# Patient Record
Sex: Male | Born: 1940 | Race: Black or African American | Hispanic: No | Marital: Single | Smoking: Current every day smoker
Health system: Southern US, Community
[De-identification: ages and names within clinical notes are randomized; demographics above are authoritative.]

## PROBLEM LIST (undated history)

## (undated) DIAGNOSIS — M24549 Contracture, unspecified hand: Secondary | ICD-10-CM

## (undated) DIAGNOSIS — E119 Type 2 diabetes mellitus without complications: Secondary | ICD-10-CM

## (undated) DIAGNOSIS — M069 Rheumatoid arthritis, unspecified: Secondary | ICD-10-CM

## (undated) DIAGNOSIS — F419 Anxiety disorder, unspecified: Secondary | ICD-10-CM

## (undated) DIAGNOSIS — K5790 Diverticulosis of intestine, part unspecified, without perforation or abscess without bleeding: Secondary | ICD-10-CM

## (undated) DIAGNOSIS — I219 Acute myocardial infarction, unspecified: Secondary | ICD-10-CM

## (undated) DIAGNOSIS — I10 Essential (primary) hypertension: Secondary | ICD-10-CM

## (undated) DIAGNOSIS — T148XXA Other injury of unspecified body region, initial encounter: Secondary | ICD-10-CM

## (undated) DIAGNOSIS — I2510 Atherosclerotic heart disease of native coronary artery without angina pectoris: Secondary | ICD-10-CM

## (undated) DIAGNOSIS — I440 Atrioventricular block, first degree: Secondary | ICD-10-CM

## (undated) DIAGNOSIS — K859 Acute pancreatitis without necrosis or infection, unspecified: Secondary | ICD-10-CM

## (undated) DIAGNOSIS — I491 Atrial premature depolarization: Secondary | ICD-10-CM

## (undated) DIAGNOSIS — I517 Cardiomegaly: Secondary | ICD-10-CM

## (undated) DIAGNOSIS — J189 Pneumonia, unspecified organism: Secondary | ICD-10-CM

## (undated) DIAGNOSIS — I209 Angina pectoris, unspecified: Secondary | ICD-10-CM

## (undated) DIAGNOSIS — I639 Cerebral infarction, unspecified: Secondary | ICD-10-CM

## (undated) HISTORY — PX: INGUINAL HERNIA REPAIR: SUR1180

## (undated) HISTORY — PX: LACERATION REPAIR: SHX5168

## (undated) HISTORY — PX: ORTHOPEDIC SURGERY: SHX850

## (undated) HISTORY — PX: CATARACT EXTRACTION W/ INTRAOCULAR LENS  IMPLANT, BILATERAL: SHX1307

## (undated) HISTORY — PX: PLEURAL SCARIFICATION: SHX748

---

## 1998-10-18 ENCOUNTER — Encounter: Payer: Self-pay | Admitting: Family Medicine

## 1998-10-18 ENCOUNTER — Ambulatory Visit (HOSPITAL_COMMUNITY): Admission: RE | Admit: 1998-10-18 | Discharge: 1998-10-18 | Payer: Self-pay | Admitting: Family Medicine

## 1999-02-22 ENCOUNTER — Ambulatory Visit (HOSPITAL_COMMUNITY): Admission: RE | Admit: 1999-02-22 | Discharge: 1999-02-22 | Payer: Self-pay | Admitting: Family Medicine

## 1999-02-22 ENCOUNTER — Encounter: Payer: Self-pay | Admitting: Family Medicine

## 2002-06-14 ENCOUNTER — Emergency Department (HOSPITAL_COMMUNITY): Admission: EM | Admit: 2002-06-14 | Discharge: 2002-06-14 | Payer: Self-pay

## 2002-06-14 ENCOUNTER — Encounter: Payer: Self-pay | Admitting: Emergency Medicine

## 2003-10-03 DIAGNOSIS — K5790 Diverticulosis of intestine, part unspecified, without perforation or abscess without bleeding: Secondary | ICD-10-CM

## 2003-10-03 HISTORY — DX: Diverticulosis of intestine, part unspecified, without perforation or abscess without bleeding: K57.90

## 2003-11-05 ENCOUNTER — Emergency Department (HOSPITAL_COMMUNITY): Admission: EM | Admit: 2003-11-05 | Discharge: 2003-11-05 | Payer: Self-pay | Admitting: Emergency Medicine

## 2004-05-03 ENCOUNTER — Inpatient Hospital Stay (HOSPITAL_COMMUNITY): Admission: EM | Admit: 2004-05-03 | Discharge: 2004-05-06 | Payer: Self-pay | Admitting: Emergency Medicine

## 2004-05-03 ENCOUNTER — Encounter (INDEPENDENT_AMBULATORY_CARE_PROVIDER_SITE_OTHER): Payer: Self-pay | Admitting: Cardiology

## 2004-05-12 ENCOUNTER — Encounter: Admission: RE | Admit: 2004-05-12 | Discharge: 2004-05-12 | Payer: Self-pay | Admitting: Internal Medicine

## 2007-10-03 DIAGNOSIS — T148XXA Other injury of unspecified body region, initial encounter: Secondary | ICD-10-CM

## 2007-10-03 HISTORY — DX: Other injury of unspecified body region, initial encounter: T14.8XXA

## 2008-04-30 ENCOUNTER — Inpatient Hospital Stay (HOSPITAL_COMMUNITY): Admission: EM | Admit: 2008-04-30 | Discharge: 2008-05-15 | Payer: Self-pay | Admitting: Emergency Medicine

## 2008-05-01 ENCOUNTER — Encounter: Payer: Self-pay | Admitting: Vascular Surgery

## 2008-05-01 ENCOUNTER — Ambulatory Visit: Payer: Self-pay | Admitting: Vascular Surgery

## 2008-05-13 ENCOUNTER — Encounter (INDEPENDENT_AMBULATORY_CARE_PROVIDER_SITE_OTHER): Payer: Self-pay | Admitting: General Surgery

## 2008-05-27 ENCOUNTER — Emergency Department (HOSPITAL_COMMUNITY): Admission: EM | Admit: 2008-05-27 | Discharge: 2008-05-27 | Payer: Self-pay | Admitting: Emergency Medicine

## 2008-06-02 ENCOUNTER — Ambulatory Visit: Payer: Self-pay | Admitting: Vascular Surgery

## 2008-07-07 ENCOUNTER — Ambulatory Visit: Payer: Self-pay | Admitting: Vascular Surgery

## 2008-07-22 ENCOUNTER — Encounter: Admission: RE | Admit: 2008-07-22 | Discharge: 2008-10-01 | Payer: Self-pay | Admitting: Vascular Surgery

## 2008-10-07 ENCOUNTER — Encounter: Admission: RE | Admit: 2008-10-07 | Discharge: 2009-01-05 | Payer: Self-pay | Admitting: Vascular Surgery

## 2008-11-17 ENCOUNTER — Ambulatory Visit: Payer: Self-pay | Admitting: Vascular Surgery

## 2009-02-07 ENCOUNTER — Emergency Department (HOSPITAL_COMMUNITY): Admission: EM | Admit: 2009-02-07 | Discharge: 2009-02-07 | Payer: Self-pay | Admitting: Emergency Medicine

## 2009-08-17 ENCOUNTER — Emergency Department (HOSPITAL_COMMUNITY): Admission: EM | Admit: 2009-08-17 | Discharge: 2009-08-17 | Payer: Self-pay | Admitting: Emergency Medicine

## 2010-10-02 DIAGNOSIS — M24549 Contracture, unspecified hand: Secondary | ICD-10-CM

## 2010-10-02 DIAGNOSIS — J189 Pneumonia, unspecified organism: Secondary | ICD-10-CM

## 2010-10-02 HISTORY — DX: Contracture, unspecified hand: M24.549

## 2010-10-02 HISTORY — DX: Pneumonia, unspecified organism: J18.9

## 2011-01-04 LAB — CBC
MCV: 82.8 fL (ref 78.0–100.0)
Platelets: 368 10*3/uL (ref 150–400)
RDW: 18.7 % — ABNORMAL HIGH (ref 11.5–15.5)
WBC: 8.4 10*3/uL (ref 4.0–10.5)

## 2011-01-04 LAB — POTASSIUM: Potassium: 3.4 mEq/L — ABNORMAL LOW (ref 3.5–5.1)

## 2011-01-04 LAB — BASIC METABOLIC PANEL
BUN: 13 mg/dL (ref 6–23)
Chloride: 104 mEq/L (ref 96–112)
Creatinine, Ser: 0.8 mg/dL (ref 0.4–1.5)
GFR calc non Af Amer: >60 mL/min (ref >60)
Glucose, Bld: 89 mg/dL (ref 70–99)

## 2011-01-04 LAB — DIFFERENTIAL
Basophils Absolute: 0.1 10*3/uL (ref 0.0–0.1)
Eosinophils Absolute: 0.2 10*3/uL (ref 0.0–0.7)
Lymphs Abs: 2 10*3/uL (ref 0.7–4.0)
Neutrophils Relative %: 64 % (ref 43–77)

## 2011-01-04 LAB — HEPATIC FUNCTION PANEL
Albumin: 3.5 g/dL (ref 3.5–5.2)
Alkaline Phosphatase: 65 U/L (ref 39–117)
Total Protein: 6.9 g/dL (ref 6.0–8.3)

## 2011-01-04 LAB — LIPASE, BLOOD: Lipase: 21 U/L (ref 11–59)
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
## 2011-02-17 NOTE — Op Note (Signed)
[name], [name] NO.:  [id number]   MEDICAL RECORD NO.:  [id number]                   PATIENT TYPE:  INP   LOCATION:  4708                                 FACILITY:  [facility]   PHYSICIAN:  Graylin Shiver, M.D.                DATE OF BIRTH:  06-23-1941   DATE OF PROCEDURE:  05/05/2004  DATE OF DISCHARGE:                                 OPERATIVE REPORT   PROCEDURE PERFORMED:  Colonoscopy.   INDICATIONS FOR PROCEDURE:  Severe anemia.  Recent rectal bleeding.   Informed consent was obtained after explanation of the risks of bleeding,  infection, and perforation.   PREMEDICATIONS:  The procedure was done immediately after an EGD with an  additional 20 mcg of fentanyl and 2 mg of Versed given.   DESCRIPTION OF PROCEDURE:  With the patient in the left lateral decubitus  position, a rectal exam was performed and no masses were felt.  The Olympus  colonoscope was inserted into the rectum and advanced around the colon to  the cecum.  Cecal landmarks were identified.  The cecum looked normal.  There were some scattered diverticula noted in the right colon.  The  transverse colon looked normal.  The descending colon and sigmoid showed  diverticulosis.  The rectum looked normal.  The patient tolerated the  procedure well without complications.   IMPRESSION:  Diverticulosis of the colon.                                               Graylin Shiver, M.D.    [name]  D:  05/05/2004  T:  05/06/2004  Job:  [id number]

## 2011-02-17 NOTE — Discharge Summary (Signed)
NAMEJEFFRE, [name] NO.:  [id number]   MEDICAL RECORD NO.:  [id number]                   PATIENT TYPE:  INP   LOCATION:  4708                                 FACILITY:  [facility]   PHYSICIAN:  Zetta Bills, MD                       DATE OF BIRTH:  1941-01-15   DATE OF ADMISSION:  05/03/2004  DATE OF DISCHARGE:  05/06/2004                                 DISCHARGE SUMMARY   DISCHARGE DIAGNOSES:  1. Hypertension uncontrolled, with chronic heart failure.  2. Chronic obstructive pulmonary disease with infiltrative pneumonia.  3. Diverticulosis and microcytic anemia.  4. Chronic alcohol abuse.  5. Chronic tobacco abuse.   MEDICATIONS:  1. Lasix 40 mg b.i.d.  2. Albuterol MDI one to two puffs q.6h.  3. Avelox 400 mg p.o. daily x7.  4. Enalapril 2.5 mg two p.o. b.i.d.  5. KCl 20 mEq p.o. daily.  6. Pepcid 40 mg p.o. q.h.s.   FOLLOW UP:  Follow-up in outpatient clinic at [facility] with Lonia Farber, A.I., supervising resident, Zetta Bills, M.D.,  on Thursday, May 12, 2004, at 2:30 p.m.  We will recheck hemoglobin and  hematocrit and recheck a potassium during this visit.  I will counsel about  smoking and reevaluate signs and symptoms of CHF and his Lasix level.   PROCEDURES AND CONSULTS:  1. GI was consulted and performed EGD and colonoscopy which was shown to     have gastritis with few erosions in the antrum.  Coloscopy showed     diverticulosis of the colon.  2. Chest x-ray performed and showed bibasilar atelectasis or infiltrate.   HISTORY OF PRESENT ILLNESS:  Mr. Barbaro is a 70 year old African American man  with chronic heart failure and uncontrolled hypertension, COPD, chronic  alcohol abuse x40 years who presented with signs and symptoms of acute  exacerbation of heart failure following three weeks of bright red blood per  rectum each morning.  He is not actively bleeding and was guaiac negative in  the ED.  He reported  increase weakness, blurry vision and bilateral pedal  edema and experienced intermittent chest pain described as nonradiating  prior to admission.  He had a recent history of alcohol abuse with  questionable falling episode.  This was witnessed by friends and felt to  have precipitated some of his recent course.  While in the ED, he received  packed red blood cells two units with IV Lasix at 40 mg after each unit then  started 40 mg Lasix daily.  We also started Avelox 400 mg and two liters  oxygen nasal cannula.  His cardiac enzymes were negative x3.   PHYSICAL EXAMINATION:  GENERAL APPEARANCE:  He was cooperative but  uncomfortable in the hospital.  VITAL SIGNS:  Vitals on admission showed temperature 97.8, heart rate of  103, respiratory rate  20, blood pressure 151/101, and was put on two liters  breathing at 98% on room air.  His weight was 166.4 on admission and 158.6  on discharge.  HEENT:  Normocephalic without signs of trauma to his head.  Eyes:  Pupils  are equal, round and reactive to light.  The right eye is 20/70 visual  acuity, left eye is 20/50 visual acuity.  Positive AV nicking without  obvious hemorrhaging.  Bilateral arcus senilis corneal rings.  No nystagmus.  NECK:  No lymphadenopathy.  His neck was supple.  LUNGS:  Followed by administration of albuterol but had good breath sounds  with mild left upper quadrant expiratory wheezing.  CARDIOVASCULAR:  Positive JVD 1 to 2 cm below jaw line.  No organomegaly  appreciated.  Positive hepatojugular reflux on admission.  Negative bruits  bilaterally.  Heart rate was tachy but regular rhythm without murmur.  He  had 2+ dorsalis pedis pulses bilaterally with 2+ pitting edema at dorsum of  foot to ankle with tightness and swelling to the knees.  ABDOMEN:  Soft and nontender without superficial veins and positive bowel  sounds.  NEUROLOGIC:  5/5 with diminished vibratory sensation.  RECTAL:  No acute bleeding.  No signs of  external hemorrhoids.   LABORATORY DATA:  Labs on admission with H&H of 6.7/21.7, white blood cell  count 12.1.   HOSPITAL COURSE:  PROBLEM #1 -  CONGESTIVE HEART FAILURE WITH ACUTE  EXACERBATION POSSIBLY INFECTIOUS WITH PNEUMONIA:  Likely induced by anemia  due to recent blood loss.  He was started on ACE inhibitor which has been  shown to have a 40% decreased mortality.  We should consider beta-blocker in  the future.  He is on Lasix 40 mg IV daily.  He is not in atrial  fibrillation nor do we suspect LV thrombus.  He will not need oxygen at  home.  On discharge, he was saturating 95% on room air.   PROBLEM #2 -  HYPERTENSION:  Untreated x5 years.  We will add beta-blocker  on outpatient visit.  His cardiac enzymes were negative x3.   PROBLEM #3 -  CHRONIC OBSTRUCTIVE PULMONARY DISEASE:  A 40-pack-year smoking  history.  Started albuterol MDI.  Received a nicotine patch while here.  Will consider adding Advair inhaler in the future if needed.  Will continue  counseling about not smoking.   PROBLEM #4 -  GASTROINTESTINAL BLEED/DIVERTICULOSIS:  GI found  diverticulosis in the lower colon, not actively bleeding.  We will continue  him on Protonix and have counseled him about his diet.   PROBLEM #5 -  ANEMIA:  Iron deficiency secondary to blood loss.  Had started  him on iron upon discharge.  Will check this on an outpatient basis.   PROBLEM #6 -  HYPOKALEMIA:  We started him on 20 mEq of potassium.  We will  check a magnesium when he comes in as an outpatient and recheck his  potassium.   PROBLEM #7 -  ALCOHOL HISTORY:  Started him on thiamine and folic acid.  He  claims he will continue taking a multivitamin at home.   DISCHARGE LABORATORY DATA:  White blood cell count 11.6, red blood cells  3.87, hemoglobin 8.6 after six units of transfusion of packed red blood  cells, 27.6, MCV 71.3, RDW 29.3, platelets 358.  Routine chemistry:  Sodium 138, potassium 3.4, chloride 105, CO2 27,  glucose 92, BUN 4, creatinine 0.9.  Bilirubin 0.8, alkaline phosphatase 74, SGOT 16, SGPT 8,  total protein 6.9,  albumin 2.9, calcium 8.9.  Hemoglobin A1C 5.6.  Cardiac markers:  Total  creatinine kinase 60, CK-MB 0.8, troponin I 0.02.  BNP 2265.  Lipid studies:  Cholesterol 71,  triglycerides 69, HDL 27, cholesterol:HDL ratio of 2.6, LDL 30, VLDL 14.  Anemia studies:  Iron less than 10, ferritin 12.  Thyroid studies:  TSH 1.2.  UA is negative for nitrites, large leukocytes and 7 to 10 white blood cells.  Helicobacter screen:  He was urease negative.                                                Zetta Bills, MD    JP/MEDQ  D:  05/09/2004  T:  05/10/2004  Job:  [id number]

## 2011-02-17 NOTE — Op Note (Signed)
NAMEDARROLL, BREDESON NO.:  [id number]   MEDICAL RECORD NO.:  [id number]                   PATIENT TYPE:  INP   LOCATION:  4708                                 FACILITY:  [facility]   PHYSICIAN:  Graylin Shiver, M.D.                DATE OF BIRTH:  01-05-1941   DATE OF PROCEDURE:  05/05/2004  DATE OF DISCHARGE:                                 OPERATIVE REPORT   PROCEDURE:  Esophagogastroduodenoscopy with biopsy.   INDICATIONS:  Severe anemia, rule out upper GI lesion.   Informed consent was obtained after explanation of the risks of bleeding,  infection, and perforation.   PREMEDICATION:  Fentanyl 40 mcg IV, Versed 4 mg IV.   PROCEDURE:  With the patient in the left lateral decubitus position, the  Olympus gastroscope was inserted into the oropharynx and passed into the  esophagus.  It was advanced down the esophagus, then into the stomach and  into the duodenum.  The second portion and bulb of the duodenum were normal.  The stomach showed a few erosions in the antrum and generalized gastritis.  Biopsy for CLOtest was obtained.  No lesions were seen in the fundus or  cardia.  The esophagus looked normal in its entirety.  He tolerated the  procedure well without complications.   IMPRESSION:  Gastritis with a few erosions in the antrum.                                               Graylin Shiver, M.D.    [name]  D:  05/05/2004  T:  05/06/2004  Job:  [id number]

## 2011-06-30 LAB — CBC
HCT: 15.3 — ABNORMAL LOW
HCT: 27.7 — ABNORMAL LOW
HCT: 15.9 — ABNORMAL LOW
HCT: 24.1 — ABNORMAL LOW
Hemoglobin: 4.1 — CL
Hemoglobin: 8.7 — ABNORMAL LOW
Hemoglobin: 5.2 — CL
Hemoglobin: 8.2 — ABNORMAL LOW
Hemoglobin: 9 — ABNORMAL LOW
Hemoglobin: 9.8 — ABNORMAL LOW
MCHC: 32.6
MCHC: 33.2
MCHC: 33.8
MCHC: 33.8
MCV: 76.5 — ABNORMAL LOW
MCV: 78.7
MCV: 75 — ABNORMAL LOW
MCV: 83.3
MCV: 83.9
Platelets: 164
Platelets: 218
Platelets: 507 — ABNORMAL HIGH
Platelets: 146 — ABNORMAL LOW
Platelets: 181
Platelets: 220
RBC: 3.62 — ABNORMAL LOW
RBC: 2.88 — ABNORMAL LOW
RBC: 3.2 — ABNORMAL LOW
RBC: 3.45 — ABNORMAL LOW
RDW: 19.9 — ABNORMAL HIGH
RDW: 22.3 — ABNORMAL HIGH
RDW: 24.3 — ABNORMAL HIGH
RDW: 24.5 — ABNORMAL HIGH
RDW: 26.4 — ABNORMAL HIGH
WBC: 12.5 — ABNORMAL HIGH
WBC: 23.9 — ABNORMAL HIGH
WBC: 24.3 — ABNORMAL HIGH
WBC: 11.1 — ABNORMAL HIGH
WBC: 21.1 — ABNORMAL HIGH

## 2011-06-30 LAB — BASIC METABOLIC PANEL
BUN: 15
BUN: 15
BUN: 13
CO2: 22
CO2: 23
CO2: 26
Calcium: 8.4
Calcium: 7.9 — ABNORMAL LOW
Calcium: 8 — ABNORMAL LOW
Calcium: 8.6
Calcium: 8.7
Chloride: 107
Chloride: 109
Chloride: 114 — ABNORMAL HIGH
Creatinine, Ser: 1.38
Creatinine, Ser: 0.73
Creatinine, Ser: 1.16
GFR calc Af Amer: >60
GFR calc Af Amer: >60
GFR calc Af Amer: >60
GFR calc Af Amer: >60
GFR calc non Af Amer: 51 — ABNORMAL LOW
GFR calc non Af Amer: >60
GFR calc non Af Amer: >60
GFR calc non Af Amer: >60
Glucose, Bld: 163 — ABNORMAL HIGH
Glucose, Bld: 124 — ABNORMAL HIGH
Glucose, Bld: 94
Potassium: 4
Potassium: 3.1 — ABNORMAL LOW
Sodium: 137
Sodium: 130 — ABNORMAL LOW
Sodium: 135
Sodium: 137
Sodium: 137
Sodium: 140

## 2011-06-30 LAB — POCT I-STAT, CHEM 8
Chloride: 104
Creatinine, Ser: 1.5
Glucose, Bld: 165 — ABNORMAL HIGH
HCT: 28 — ABNORMAL LOW
Potassium: 3 — ABNORMAL LOW

## 2011-06-30 LAB — COMPREHENSIVE METABOLIC PANEL
AST: 35
Albumin: 1.8 — ABNORMAL LOW
CO2: 25
Calcium: 8 — ABNORMAL LOW
Creatinine, Ser: 0.87
GFR calc Af Amer: >60
GFR calc non Af Amer: >60
Sodium: 134 — ABNORMAL LOW
Total Protein: 4.5 — ABNORMAL LOW

## 2011-06-30 LAB — POCT I-STAT 3, ART BLOOD GAS (G3+)
O2 Saturation: 95
TCO2: 19
pCO2 arterial: 36.1
pO2, Arterial: 76 — ABNORMAL LOW

## 2011-06-30 LAB — TYPE AND SCREEN
ABO/RH(D): O POS
Antibody Screen: NEGATIVE

## 2011-06-30 LAB — POCT I-STAT 7, (LYTES, BLD GAS, ICA,H+H)
Acid-base deficit: 4 — ABNORMAL HIGH
Calcium, Ion: 1.24
O2 Saturation: 100
Potassium: 4.1
Sodium: 141
TCO2: 22

## 2011-06-30 LAB — CROSSMATCH

## 2011-06-30 LAB — POCT I-STAT 4, (NA,K, GLUC, HGB,HCT)
Glucose, Bld: 118 — ABNORMAL HIGH
Glucose, Bld: 120 — ABNORMAL HIGH
HCT: 20 — ABNORMAL LOW
Hemoglobin: 7.5 — CL
Potassium: 4.2
Sodium: 140

## 2011-06-30 LAB — PROTIME-INR
INR: 1.2
INR: 1.1
INR: 1.1
Prothrombin Time: 15.7 — ABNORMAL HIGH

## 2011-06-30 LAB — URINALYSIS, ROUTINE W REFLEX MICROSCOPIC
Nitrite: NEGATIVE
Protein, ur: NEGATIVE
Urobilinogen, UA: 1

## 2011-06-30 LAB — APTT: aPTT: 28

## 2011-06-30 LAB — URINE MICROSCOPIC-ADD ON

## 2011-06-30 LAB — ABO/RH: ABO/RH(D): O POS

## 2011-06-30 LAB — ETHANOL: Alcohol, Ethyl (B): 188 — ABNORMAL HIGH

## 2011-06-30 LAB — RAPID URINE DRUG SCREEN, HOSP PERFORMED: Barbiturates: NOT DETECTED

## 2011-07-21 ENCOUNTER — Emergency Department (HOSPITAL_COMMUNITY)
Admission: EM | Admit: 2011-07-21 | Discharge: 2011-07-21 | Discharge status: Against Medical Advice | Payer: Medicare Other | Attending: Emergency Medicine | Admitting: Emergency Medicine

## 2011-07-21 ENCOUNTER — Emergency Department (HOSPITAL_COMMUNITY): Payer: Medicare Other

## 2011-07-21 DIAGNOSIS — I10 Essential (primary) hypertension: Secondary | ICD-10-CM | POA: Insufficient documentation

## 2011-07-21 DIAGNOSIS — R05 Cough: Secondary | ICD-10-CM | POA: Insufficient documentation

## 2011-07-21 DIAGNOSIS — R059 Cough, unspecified: Secondary | ICD-10-CM | POA: Insufficient documentation

## 2011-07-21 DIAGNOSIS — R079 Chest pain, unspecified: Secondary | ICD-10-CM | POA: Insufficient documentation

## 2011-07-21 DIAGNOSIS — R112 Nausea with vomiting, unspecified: Secondary | ICD-10-CM | POA: Insufficient documentation

## 2011-07-21 DIAGNOSIS — M25519 Pain in unspecified shoulder: Secondary | ICD-10-CM | POA: Insufficient documentation

## 2011-07-21 DIAGNOSIS — I252 Old myocardial infarction: Secondary | ICD-10-CM | POA: Insufficient documentation

## 2011-07-21 LAB — CBC
HCT: 33.7 % — ABNORMAL LOW (ref 39.0–52.0)
Hemoglobin: 9.6 g/dL — ABNORMAL LOW (ref 13.0–17.0)
MCHC: 28.5 g/dL — ABNORMAL LOW (ref 30.0–36.0)

## 2011-07-21 LAB — POCT I-STAT TROPONIN I

## 2011-07-21 LAB — BASIC METABOLIC PANEL
BUN: 15 mg/dL (ref 6–23)
GFR calc non Af Amer: 85 mL/min — ABNORMAL LOW (ref >90)
Glucose, Bld: 129 mg/dL — ABNORMAL HIGH (ref 70–99)
Potassium: 3 mEq/L — ABNORMAL LOW (ref 3.5–5.1)

## 2011-07-21 LAB — DIFFERENTIAL
Basophils Relative: 1 % (ref 0–1)
Eosinophils Relative: 3 % (ref 0–5)
Lymphs Abs: 2.3 10*3/uL (ref 0.7–4.0)
Monocytes Relative: 6 % (ref 3–12)
Neutro Abs: 6.8 10*3/uL (ref 1.7–7.7)

## 2011-12-01 DIAGNOSIS — I639 Cerebral infarction, unspecified: Secondary | ICD-10-CM

## 2011-12-01 HISTORY — DX: Cerebral infarction, unspecified: I63.9

## 2011-12-02 ENCOUNTER — Other Ambulatory Visit: Payer: Self-pay

## 2011-12-02 ENCOUNTER — Inpatient Hospital Stay (HOSPITAL_COMMUNITY)
Admission: EM | Admit: 2011-12-02 | Discharge: 2011-12-06 | DRG: 064 | Discharge status: Against Medical Advice | Payer: Medicare Other | Attending: Internal Medicine | Admitting: Internal Medicine

## 2011-12-02 ENCOUNTER — Emergency Department (HOSPITAL_COMMUNITY): Payer: Medicare Other

## 2011-12-02 ENCOUNTER — Encounter (HOSPITAL_COMMUNITY): Payer: Self-pay | Admitting: *Deleted

## 2011-12-02 DIAGNOSIS — Z7982 Long term (current) use of aspirin: Secondary | ICD-10-CM

## 2011-12-02 DIAGNOSIS — I252 Old myocardial infarction: Secondary | ICD-10-CM

## 2011-12-02 DIAGNOSIS — I639 Cerebral infarction, unspecified: Secondary | ICD-10-CM | POA: Diagnosis present

## 2011-12-02 DIAGNOSIS — R1310 Dysphagia, unspecified: Secondary | ICD-10-CM

## 2011-12-02 DIAGNOSIS — I6350 Cerebral infarction due to unspecified occlusion or stenosis of unspecified cerebral artery: Principal | ICD-10-CM

## 2011-12-02 DIAGNOSIS — K208 Other esophagitis without bleeding: Secondary | ICD-10-CM | POA: Diagnosis present

## 2011-12-02 DIAGNOSIS — R1319 Other dysphagia: Secondary | ICD-10-CM | POA: Diagnosis present

## 2011-12-02 DIAGNOSIS — I10 Essential (primary) hypertension: Secondary | ICD-10-CM | POA: Diagnosis present

## 2011-12-02 DIAGNOSIS — F1910 Other psychoactive substance abuse, uncomplicated: Secondary | ICD-10-CM | POA: Diagnosis present

## 2011-12-02 DIAGNOSIS — D509 Iron deficiency anemia, unspecified: Secondary | ICD-10-CM | POA: Diagnosis present

## 2011-12-02 DIAGNOSIS — J189 Pneumonia, unspecified organism: Secondary | ICD-10-CM | POA: Diagnosis present

## 2011-12-02 DIAGNOSIS — E876 Hypokalemia: Secondary | ICD-10-CM | POA: Diagnosis present

## 2011-12-02 DIAGNOSIS — W1809XA Striking against other object with subsequent fall, initial encounter: Secondary | ICD-10-CM | POA: Diagnosis present

## 2011-12-02 DIAGNOSIS — R1110 Vomiting, unspecified: Secondary | ICD-10-CM | POA: Diagnosis present

## 2011-12-02 DIAGNOSIS — F17200 Nicotine dependence, unspecified, uncomplicated: Secondary | ICD-10-CM | POA: Diagnosis present

## 2011-12-02 DIAGNOSIS — D649 Anemia, unspecified: Secondary | ICD-10-CM

## 2011-12-02 HISTORY — DX: Anxiety disorder, unspecified: F41.9

## 2011-12-02 HISTORY — DX: Diverticulosis of intestine, part unspecified, without perforation or abscess without bleeding: K57.90

## 2011-12-02 HISTORY — DX: Acute myocardial infarction, unspecified: I21.9

## 2011-12-02 HISTORY — DX: Essential (primary) hypertension: I10

## 2011-12-02 HISTORY — DX: Other injury of unspecified body region, initial encounter: T14.8XXA

## 2011-12-02 LAB — COMPREHENSIVE METABOLIC PANEL
ALT: 6 U/L (ref 0–53)
AST: 14 U/L (ref 0–37)
Albumin: 3.3 g/dL — ABNORMAL LOW (ref 3.5–5.2)
Alkaline Phosphatase: 64 U/L (ref 39–117)
BUN: 14 mg/dL (ref 6–23)
CO2: 28 mEq/L (ref 19–32)
Calcium: 9.2 mg/dL (ref 8.4–10.5)
Chloride: 102 mEq/L (ref 96–112)
Creatinine, Ser: 0.89 mg/dL (ref 0.50–1.35)
GFR calc Af Amer: >90 mL/min (ref >90)
GFR calc non Af Amer: 84 mL/min — ABNORMAL LOW (ref >90)
Glucose, Bld: 102 mg/dL — ABNORMAL HIGH (ref 70–99)
Potassium: 2.8 mEq/L — ABNORMAL LOW (ref 3.5–5.1)
Sodium: 139 mEq/L (ref 135–145)
Total Bilirubin: 0.3 mg/dL (ref 0.3–1.2)
Total Protein: 7.1 g/dL (ref 6.0–8.3)

## 2011-12-02 LAB — DIFFERENTIAL
Basophils Absolute: 0.1 10*3/uL (ref 0.0–0.1)
Basophils Relative: 1 % (ref 0–1)
Eosinophils Absolute: 0.4 10*3/uL (ref 0.0–0.7)
Eosinophils Relative: 5 % (ref 0–5)
Lymphocytes Relative: 30 % (ref 12–46)
Lymphs Abs: 2.3 10*3/uL (ref 0.7–4.0)
Monocytes Absolute: 0.7 10*3/uL (ref 0.1–1.0)
Monocytes Relative: 9 % (ref 3–12)
Neutro Abs: 4.1 10*3/uL (ref 1.7–7.7)
Neutrophils Relative %: 55 % (ref 43–77)

## 2011-12-02 LAB — CBC
HCT: 23.2 % — ABNORMAL LOW (ref 39.0–52.0)
Hemoglobin: 6 g/dL — CL (ref 13.0–17.0)
MCH: 16.9 pg — ABNORMAL LOW (ref 26.0–34.0)
MCHC: 25.9 g/dL — ABNORMAL LOW (ref 30.0–36.0)
MCV: 65.4 fL — ABNORMAL LOW (ref 78.0–100.0)
Platelets: 360 10*3/uL (ref 150–400)
RBC: 3.55 MIL/uL — ABNORMAL LOW (ref 4.22–5.81)
RDW: 19.7 % — ABNORMAL HIGH (ref 11.5–15.5)
WBC: 7.5 10*3/uL (ref 4.0–10.5)

## 2011-12-02 LAB — URINALYSIS, ROUTINE W REFLEX MICROSCOPIC
Glucose, UA: NEGATIVE mg/dL
Hgb urine dipstick: NEGATIVE
Ketones, ur: NEGATIVE mg/dL
Nitrite: NEGATIVE
Protein, ur: NEGATIVE mg/dL
Specific Gravity, Urine: 1.02 (ref 1.005–1.030)
Urobilinogen, UA: 1 mg/dL (ref 0.0–1.0)
pH: 8 (ref 5.0–8.0)

## 2011-12-02 LAB — URINE MICROSCOPIC-ADD ON

## 2011-12-02 LAB — OCCULT BLOOD, POC DEVICE
Fecal Occult Bld: NEGATIVE
Fecal Occult Bld: NEGATIVE

## 2011-12-02 LAB — TROPONIN I: Troponin I: <0.3 ng/mL (ref <0.30)

## 2011-12-02 LAB — PREPARE RBC (CROSSMATCH)

## 2011-12-02 MED ORDER — PNEUMOCOCCAL VAC POLYVALENT 25 MCG/0.5ML IJ INJ
0.5000 mL | INJECTION | INTRAMUSCULAR | Status: AC
  Administered 2011-12-03: 0.5 mL via INTRAMUSCULAR
  Filled 2011-12-02: qty 0.5

## 2011-12-02 MED ORDER — ACETAMINOPHEN 325 MG PO TABS
650.0000 mg | ORAL_TABLET | Freq: Four times a day (QID) | ORAL | Status: DC | PRN
  Filled 2011-12-02: qty 2

## 2011-12-02 MED ORDER — SODIUM CHLORIDE 0.9 % IV SOLN
INTRAVENOUS | Status: AC
  Administered 2011-12-03: 02:00:00 via INTRAVENOUS

## 2011-12-02 MED ORDER — ONDANSETRON HCL 4 MG/2ML IJ SOLN: 4.0000 mg | Freq: Four times a day (QID) | INTRAMUSCULAR | Status: DC | PRN

## 2011-12-02 MED ORDER — ACETAMINOPHEN 650 MG RE SUPP: 650.0000 mg | RECTAL | Status: DC | PRN

## 2011-12-02 MED ORDER — SODIUM CHLORIDE 0.9 % IJ SOLN
3.0000 mL | Freq: Two times a day (BID) | INTRAMUSCULAR | Status: DC
  Administered 2011-12-03 – 2011-12-06 (×5): 3 mL via INTRAVENOUS

## 2011-12-02 MED ORDER — ACETAMINOPHEN 650 MG RE SUPP: 650.0000 mg | Freq: Four times a day (QID) | RECTAL | Status: DC | PRN

## 2011-12-02 MED ORDER — PANTOPRAZOLE SODIUM 40 MG IV SOLR
40.0000 mg | Freq: Two times a day (BID) | INTRAVENOUS | Status: DC
  Administered 2011-12-03 (×2): 40 mg via INTRAVENOUS
  Filled 2011-12-02 (×4): qty 40

## 2011-12-02 MED ORDER — INFLUENZA VIRUS VACC SPLIT PF IM SUSP
0.5000 mL | INTRAMUSCULAR | Status: AC
  Administered 2011-12-03: 0.5 mL via INTRAMUSCULAR
  Filled 2011-12-02: qty 0.5

## 2011-12-02 MED ORDER — ACETAMINOPHEN 325 MG PO TABS
650.0000 mg | ORAL_TABLET | ORAL | Status: DC | PRN
  Administered 2011-12-04 – 2011-12-05 (×2): 650 mg via ORAL
  Filled 2011-12-02: qty 2

## 2011-12-02 MED ORDER — SENNOSIDES-DOCUSATE SODIUM 8.6-50 MG PO TABS
1.0000 | ORAL_TABLET | Freq: Every evening | ORAL | Status: DC | PRN
  Filled 2011-12-02: qty 1

## 2011-12-02 MED ORDER — NICOTINE 14 MG/24HR TD PT24
14.0000 mg | MEDICATED_PATCH | Freq: Every day | TRANSDERMAL | Status: DC | PRN
  Administered 2011-12-03: 14 mg via TRANSDERMAL
  Filled 2011-12-02 (×2): qty 1

## 2011-12-02 MED ORDER — ONDANSETRON HCL 4 MG PO TABS
4.0000 mg | ORAL_TABLET | Freq: Four times a day (QID) | ORAL | Status: DC | PRN
  Administered 2011-12-03: 4 mg via ORAL
  Filled 2011-12-02: qty 1

## 2011-12-02 NOTE — ED Notes (Signed)
Pt ambulated in the room with the residents at the bedside, Pt very unsteady in gait. MD aware

## 2011-12-02 NOTE — ED Notes (Signed)
Pt has nausea.  Vomited approx 100cc of light brown emesis with food in it

## 2011-12-02 NOTE — ED Notes (Signed)
Critical hgb 6.0 per lab

## 2011-12-02 NOTE — ED Notes (Signed)
Pt talking with girlfriend on phone.  States he is ready to go home

## 2011-12-02 NOTE — Consult Note (Signed)
Referring Physician: Dr. lawyer    Chief Complaint: Dizziness  HPI: [name] is an 71 y.o. male black presenting in the ER with vertigo/dizziness. The patient tells me he woke up this morning and felt like the room was spinning. He got out of bed and was unable to ambulate. He felt out of balance and fell several times as the day progressed. Patient also had nausea and vomiting. He was knocking into the wall and other objects because his vision was not "quite right". The patient did not have a headache but he did have numbness/dullness on the left side of his head and face. The patient did not have any changes in his speech. He does not believe he had any trouble with swallowing. The patient did not have any numbness or tingling anywhere else. Nor did he have any weakness anywhere. The patient took 2 aspirins this morning but does not take aspirin on a daily basis.  LSN: Early morning 12/02/2011  tPA Given: No: patient is outside the time window for TPA treatment.  mRankin:  Past Medical History  Diagnosis Date  . Myocardial infarct   . Hypertension   . Anxiety     Past Surgical History  Procedure Date  . Hernia repair   . Orthopedic surgery   . Pleural scarification     No family history on file. Social History:  reports that he has been smoking.  He does not have any smokeless tobacco history on file. He reports that he drinks alcohol. He reports that he does not use illicit drugs.  Allergies: No Known Allergies  Medications: I have reviewed the patient's current medications.  JXB:JYNWGNF denies chest pain, abdominal pain, fever or chills. He had a bloody bowel movement about 3 weeks ago and also coughed up some blood earlier this week. The patient denies any blood in his urine. He denies pain with urination. He denies diarrhea. The patient denies shortness of breath, palpitations, diaphoresis. Patient does not have any muscle aches. He  Physical Examination: Blood pressure  149/70, pulse 90, temperature 98.2 F (36.8 C), temperature source Oral, resp. rate 18, SpO2 100.00%.  general: The patient is alert and oriented x3. He is hungry and wants to go home. Cardiovascular: Heart tones S1 and S2 are heard no S3/S4/murmurs/carotid bruits. Lungs: Clear to auscultation bilaterally in the front fields Abdomen: Soft  Neurologic Examination:  naming/repetition/comprehension intact and speech is fluent but dysarthric. Funduscopic exam shows  sharp optic discs bilaterally. Pupils are equal round and reactive to light. Extraocular movements are intact. There is a left visual field deficit in all superior, middle and inferior quadrants in both eyes. V1 through V3 decreased to soft touch and pinprick on the left. Slight facial droop left. Hearing grossly intact. Pharynx arch symmetric in stand and elevation. SCM/trapezius 5/5. Tongue midline with normal movements. Motor: 5/5 except left upper extremity which shows a deformities and increased tone status post ulnar injury Sensory: Intact to soft touch and pinprick except left upper extremity which is decreased status post ulnar injury Coordination: Finger-nose-finger and heel-knee-shin within normal limits although exam limited Reflexes: Symmetric  Results for orders placed during the hospital encounter of 12/02/11 (from the past 48 hour(s))  CBC     Status: Abnormal   Collection Time   12/02/11  3:15 PM      Component Value Range Comment   WBC 7.5  4.0 - 10.5 (K/uL)    RBC 3.55 (*) 4.22 - 5.81 (MIL/uL)    Hemoglobin  6.0 (*) 13.0 - 17.0 (g/dL)    HCT 96.0 (*) 45.4 - 52.0 (%)    MCV 65.4 (*) 78.0 - 100.0 (fL)    MCH 16.9 (*) 26.0 - 34.0 (pg)    MCHC 25.9 (*) 30.0 - 36.0 (g/dL)    RDW 09.8 (*) 11.9 - 15.5 (%)    Platelets 360  150 - 400 (K/uL)   DIFFERENTIAL     Status: Normal   Collection Time   12/02/11  3:15 PM      Component Value Range Comment   Neutrophils Relative 55  43 - 77 (%)    Neutro Abs 4.1  1.7 - 7.7 (K/uL)      Lymphocytes Relative 30  12 - 46 (%)    Lymphs Abs 2.3  0.7 - 4.0 (K/uL)    Monocytes Relative 9  3 - 12 (%)    Monocytes Absolute 0.7  0.1 - 1.0 (K/uL)    Eosinophils Relative 5  0 - 5 (%)    Eosinophils Absolute 0.4  0.0 - 0.7 (K/uL)    Basophils Relative 1  0 - 1 (%)    Basophils Absolute 0.1  0.0 - 0.1 (K/uL)   COMPREHENSIVE METABOLIC PANEL     Status: Abnormal   Collection Time   12/02/11  3:15 PM      Component Value Range Comment   Sodium 139  135 - 145 (mEq/L)    Potassium 2.8 (*) 3.5 - 5.1 (mEq/L)    Chloride 102  96 - 112 (mEq/L)    CO2 28  19 - 32 (mEq/L)    Glucose, Bld 102 (*) 70 - 99 (mg/dL)    BUN 14  6 - 23 (mg/dL)    Creatinine, Ser 1.47  0.50 - 1.35 (mg/dL)    Calcium 9.2  8.4 - 10.5 (mg/dL)    Total Protein 7.1  6.0 - 8.3 (g/dL)    Albumin 3.3 (*) 3.5 - 5.2 (g/dL)    AST 14  0 - 37 (U/L)    ALT 6  0 - 53 (U/L)    Alkaline Phosphatase 64  39 - 117 (U/L)    Total Bilirubin 0.3  0.3 - 1.2 (mg/dL)    GFR calc non Af Amer 84 (*) >90 (mL/min)    GFR calc Af Amer >90  >90 (mL/min)   TROPONIN I     Status: Normal   Collection Time   12/02/11  3:15 PM      Component Value Range Comment   Troponin I <0.30  <0.30 (ng/mL)   OCCULT BLOOD, POC DEVICE     Status: Normal   Collection Time   12/02/11  4:05 PM      Component Value Range Comment   Fecal Occult Bld NEGATIVE     TYPE AND SCREEN     Status: Normal (Preliminary result)   Collection Time   12/02/11  4:53 PM      Component Value Range Comment   ABO/RH(D) O POS      Antibody Screen NEG      Sample Expiration 12/05/2011      Unit Number [id number]      Blood Component Type RED CELLS,LR      Unit division 00      Status of Unit ALLOCATED      Transfusion Status OK TO TRANSFUSE      Crossmatch Result Compatible      Unit Number [id number]      Blood Component Type  RED CELLS,LR      Unit division 00      Status of Unit ISSUED      Transfusion Status OK TO TRANSFUSE      Crossmatch Result Compatible     PREPARE  RBC (CROSSMATCH)     Status: Normal   Collection Time   12/02/11  4:53 PM      Component Value Range Comment   Order Confirmation ORDER PROCESSED BY BLOOD BANK     URINALYSIS, ROUTINE W REFLEX MICROSCOPIC     Status: Abnormal   Collection Time   12/02/11  5:39 PM      Component Value Range Comment   Color, Urine AMBER (*) YELLOW  BIOCHEMICALS MAY BE AFFECTED BY COLOR   APPearance TURBID (*) CLEAR     Specific Gravity, Urine 1.020  1.005 - 1.030     pH 8.0  5.0 - 8.0     Glucose, UA NEGATIVE  NEGATIVE (mg/dL)    Hgb urine dipstick NEGATIVE  NEGATIVE     Bilirubin Urine SMALL (*) NEGATIVE     Ketones, ur NEGATIVE  NEGATIVE (mg/dL)    Protein, ur NEGATIVE  NEGATIVE (mg/dL)    Urobilinogen, UA 1.0  0.0 - 1.0 (mg/dL)    Nitrite NEGATIVE  NEGATIVE     Leukocytes, UA SMALL (*) NEGATIVE    URINE MICROSCOPIC-ADD ON     Status: Normal   Collection Time   12/02/11  5:39 PM      Component Value Range Comment   Squamous Epithelial / LPF RARE  RARE     WBC, UA 0-2  <3 (WBC/hpf)    Urine-Other AMORPHOUS URATES/PHOSPHATES     OCCULT BLOOD, POC DEVICE     Status: Normal   Collection Time   12/02/11  5:58 PM      Component Value Range Comment   Fecal Occult Bld NEGATIVE      Ct Head Wo Contrast  12/02/2011  *RADIOLOGY REPORT*  Clinical Data: Dizziness.  Fell.  Weakness.  CT HEAD WITHOUT CONTRAST  Technique:  Contiguous axial images were obtained from the base of the skull through the vertex without contrast.  Comparison: CT head 04/30/2008  Findings: There is central and cortical atrophy.  Periventricular white matter changes are consistent with small vessel disease. There is no evidence for hemorrhage, mass lesion, or acute infarction. There is low attenuation within the region of the right occipital lobe, favored to represent subacute or chronic infarct.  Bone windows show no calvarial fracture.  There is mucoperiosteal thickening of the ethmoid, frontal, and maxillary sinuses.  There is atherosclerotic  calcification of the internal carotid arteries.  IMPRESSION:  1.  Atrophy and small vessel disease. 2.  Subacute or chronic infarct involving the right occipital lobe. No associated hemorrhage.  Critical test results telephoned to Ebbie Ridge at the time of interpretation on date 12/02/2011 at time 3:51 p.m.  Original Report Authenticated By: Patterson Hammersmith, M.D.   Mr Brain Wo Contrast  12/02/2011  *RADIOLOGY REPORT*  Clinical Data: Weakness.  Gait disturbance.  MRI HEAD WITHOUT CONTRAST  Technique:  Multiplanar, multiecho pulse sequences of the brain and surrounding structures were obtained according to standard protocol without intravenous contrast.  Comparison: CT 12/02/2011  Findings: Acute infarct in the right posterior cerebral artery territory.  This involves the right occipital lobe, right medial temporal lobe, and right thalamus.  Generalized atrophy.  Chronic ischemic changes in the white matter. Scattered areas of chronic micro hemorrhage are present  in the brain.  These are most likely related to amyloid or chronic hypertension.  Chronic hemorrhage is present in the left cerebellum, central pons, and thalami bilaterally.  Small areas of micro hemorrhage in the right frontal and right parietal lobe also present.  Negative for mass lesion.  Chronic sinusitis.  IMPRESSION: Acute right PCA infarct.  Atrophy and chronic ischemic changes.  Original Report Authenticated By: Camelia Phenes, M.D.    Assessment: 71 y.o. male  black male with past medical history hypertension and tobacco dependence presenting in the ER with vertigo/nausea. Not on a daily blood thinner. Physical exam  left homonymous hemianopsia . MRI scan shows acute ischemic infarct right PCA, hemoglobin of 6.0. Fecal occult blood test is negative. Patient reports bloody bowel movements 3 weeks ago and coughing up blood  one week ago.    Stroke Risk Factors - hypertension and smoking  Plan: 1. HgbA1c, fasting lipid panel 2. MRA  head  3.PT consult, OT consult, Speech consult 4. Echocardiogram 5. Carotid dopplers 6. Prophylactic therapy-None 7. Risk factor modification 8. Telemetry monitoring 9. pursue source of possible hemorrhage considering low hemoglobin, defer to primary team. GI has been consult at  10. Hold blood thinners for now    Rowan Pollman Christophe Louis, MD [facility]  12/02/2011, 7:52 PM
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
## 2011-12-02 NOTE — ED Notes (Signed)
ems called to patient's home,  He lives alone.  Patient was seen by ptar after a call for syncope.  There was concern of weakness.  Patient alert and oriented upon ems arrival.  He has dizziness when he stands up.  Patient with left sided facial droop and left sided weakness.  Last seen normal yesterday.  Patient with afib, controlled rate of 80's.  cbg 117.  bp 150/92.  Patient 97 percent on room air.  No complaints of pain. Patient reports that he fell 4 times since yesterday.

## 2011-12-02 NOTE — ED Provider Notes (Signed)
History     CSN: [id number]  Arrival date & time 12/02/11  1342   First MD Initiated Contact with Patient 12/02/11 1508      Chief Complaint  Patient presents with  . Weakness    (Consider location/radiation/quality/duration/timing/severity/associated sxs/prior treatment) HPI The patient presents to the ER with a complaint of dizziness and gait disturbance that occurred this morning. The patient states that those symptoms have resolved. He also states that the L side of his face felt numb. He states that he is still having that feeling at this time. He also states that he vomited once just prior to arrival. The patient states that he felt generally weak as well. The patient denies chest pain, SOB, diarrhea, abd pain, headache, ringing in his ears, back pain, or passing out. The patient states that when he was walking he was running into things as well.  Past Medical History  Diagnosis Date  . Myocardial infarct   . Hypertension   . Anxiety     Past Surgical History  Procedure Date  . Hernia repair   . Orthopedic surgery   . Pleural scarification     No family history on file.  History  Substance Use Topics  . Smoking status: Current Everyday Smoker  . Smokeless tobacco: Not on file  . Alcohol Use: Yes      Review of Systems All pertinent positives and negatives reviewed in the history of present illness  Allergies  Review of patient's allergies indicates no known allergies.  Home Medications   Current Outpatient Rx  Name Route Sig Dispense Refill  . ASPIRIN 325 MG PO TABS Oral Take 325 mg by mouth daily as needed. pain    . TETRAHYDROZOLINE-ZN SULFATE 0.05-0.25 % OP SOLN Both Eyes Place 1 drop into both eyes 3 (three) times daily as needed. Dry eyes      BP 152/82  Pulse 111  Temp(Src) 98 F (36.7 C) (Oral)  Resp 18  SpO2 92%  Physical Exam  Constitutional: He is oriented to person, place, and time. He appears well-developed and well-nourished. No  distress.  HENT:  Head: Normocephalic and atraumatic.  Mouth/Throat: Oropharynx is clear and moist. No oropharyngeal exudate.  Eyes: EOM are normal. Pupils are equal, round, and reactive to light.  Neck: Normal range of motion. Neck supple.  Cardiovascular: Normal rate, regular rhythm and normal heart sounds.  Exam reveals no gallop and no friction rub.   No murmur heard. Pulmonary/Chest: Effort normal and breath sounds normal. No respiratory distress. He has no wheezes. He has no rales.  Abdominal: Soft. Bowel sounds are normal. He exhibits no distension. There is no tenderness. There is no rebound and no guarding.  Musculoskeletal:       The patient has a flexion contracture of the L hand and atrophy of the hand and forearm.  Neurological: He is alert and oriented to person, place, and time. He has normal strength. GCS eye subscore is 4. GCS verbal subscore is 5. GCS motor subscore is 6.  Skin: Skin is warm and dry. He is not diaphoretic.    ED Course  Procedures (including critical care time)   Labs Reviewed  CBC  DIFFERENTIAL  COMPREHENSIVE METABOLIC PANEL  URINALYSIS, ROUTINE W REFLEX MICROSCOPIC  URINE CULTURE  TROPONIN I    3:34 PM Patient seen and evaluated and all questions were answered and all needs addressed. I am concerned the patient has had a posterior circulation stroke based on his symptoms and onset  of symptoms.  He also still complaining of numbness in his left side of his face.  The workup was initiated and further evaluation will be made once all the testing was back.  I have spoken with neurology about the patient and the outpatient clinics as they were signed medicine.  Also placed a call to request outpatient clinics to GI for consult.   MDM   MDM Reviewed: nursing note, vitals and previous chart Reviewed previous: labs and ECG Interpretation: labs and ECG Consults: admitting MD, gastrointestinal and neurology    Date: 12/03/2011 13:55   Rate:  90  Rhythm: normal sinus rhythm  QRS Axis: normal  Intervals: normal  ST/T Wave abnormalities: normal  Conduction Disutrbances:first-degree A-V block   Narrative Interpretation:   Old EKG Reviewed: unchanged           Carlyle Dolly, PA-C 12/03/11 1548  Carlyle Dolly, PA-C 12/03/11 [phone]

## 2011-12-02 NOTE — ED Notes (Signed)
Pt states he has not been taking BP meds because he needs an RX for it. Has not seen his doctor in a while.  Pt girlfriend is a patient on second floor

## 2011-12-03 ENCOUNTER — Inpatient Hospital Stay (HOSPITAL_COMMUNITY): Payer: Medicare Other

## 2011-12-03 DIAGNOSIS — D509 Iron deficiency anemia, unspecified: Secondary | ICD-10-CM

## 2011-12-03 DIAGNOSIS — R1310 Dysphagia, unspecified: Secondary | ICD-10-CM | POA: Diagnosis present

## 2011-12-03 DIAGNOSIS — R1314 Dysphagia, pharyngoesophageal phase: Secondary | ICD-10-CM

## 2011-12-03 DIAGNOSIS — R42 Dizziness and giddiness: Secondary | ICD-10-CM

## 2011-12-03 DIAGNOSIS — I6350 Cerebral infarction due to unspecified occlusion or stenosis of unspecified cerebral artery: Secondary | ICD-10-CM

## 2011-12-03 LAB — CBC
HCT: 26.7 % — ABNORMAL LOW (ref 39.0–52.0)
Hemoglobin: 7.6 g/dL — ABNORMAL LOW (ref 13.0–17.0)
MCH: 19.6 pg — ABNORMAL LOW (ref 26.0–34.0)
MCH: 19.6 pg — ABNORMAL LOW (ref 26.0–34.0)
MCHC: 28.5 g/dL — ABNORMAL LOW (ref 30.0–36.0)
MCV: 69 fL — ABNORMAL LOW (ref 78.0–100.0)
Platelets: 325 10*3/uL (ref 150–400)
Platelets: 329 10*3/uL (ref 150–400)
RBC: 3.87 MIL/uL — ABNORMAL LOW (ref 4.22–5.81)
RBC: 3.97 MIL/uL — ABNORMAL LOW (ref 4.22–5.81)
RDW: 23.2 % — ABNORMAL HIGH (ref 11.5–15.5)
RDW: 22.8 % — ABNORMAL HIGH (ref 11.5–15.5)
WBC: 8.9 10*3/uL (ref 4.0–10.5)
WBC: 7.1 10*3/uL (ref 4.0–10.5)

## 2011-12-03 LAB — RETICULOCYTES
RBC.: 3.87 MIL/uL — ABNORMAL LOW (ref 4.22–5.81)
Retic Count, Absolute: 34.8 10*3/uL (ref 19.0–186.0)
Retic Ct Pct: 0.9 % (ref 0.4–3.1)

## 2011-12-03 LAB — URINE CULTURE
Colony Count: 70000
Culture  Setup Time: 201303022005

## 2011-12-03 LAB — BASIC METABOLIC PANEL
BUN: 13 mg/dL (ref 6–23)
BUN: 11 mg/dL (ref 6–23)
CO2: 26 mEq/L (ref 19–32)
CO2: 25 mEq/L (ref 19–32)
Calcium: 8.9 mg/dL (ref 8.4–10.5)
Chloride: 105 mEq/L (ref 96–112)
Chloride: 107 mEq/L (ref 96–112)
Creatinine, Ser: 0.83 mg/dL (ref 0.50–1.35)
Creatinine, Ser: 0.84 mg/dL (ref 0.50–1.35)
GFR calc Af Amer: >90 mL/min (ref >90)
GFR calc Af Amer: >90 mL/min (ref >90)
GFR calc non Af Amer: 86 mL/min — ABNORMAL LOW (ref >90)
Glucose, Bld: 96 mg/dL (ref 70–99)
Glucose, Bld: 133 mg/dL — ABNORMAL HIGH (ref 70–99)
Potassium: 2.8 mEq/L — ABNORMAL LOW (ref 3.5–5.1)
Potassium: 3.2 mEq/L — ABNORMAL LOW (ref 3.5–5.1)
Sodium: 139 mEq/L (ref 135–145)

## 2011-12-03 LAB — LIPID PANEL
Cholesterol: 95 mg/dL (ref 0–200)
HDL: 39 mg/dL — ABNORMAL LOW (ref >39)
LDL Cholesterol: 41 mg/dL (ref 0–99)
Total CHOL/HDL Ratio: 2.4 RATIO
Triglycerides: 74 mg/dL (ref <150)
VLDL: 15 mg/dL (ref 0–40)

## 2011-12-03 LAB — GLUCOSE, CAPILLARY: Glucose-Capillary: 95 mg/dL (ref 70–99)

## 2011-12-03 LAB — CARDIAC PANEL(CRET KIN+CKTOT+MB+TROPI)
CK, MB: 2.3 ng/mL (ref 0.3–4.0)
Relative Index: INVALID (ref 0.0–2.5)
Relative Index: INVALID (ref 0.0–2.5)
Relative Index: INVALID (ref 0.0–2.5)
Total CK: 84 U/L (ref 7–232)
Troponin I: <0.3 ng/mL (ref <0.30)
Troponin I: <0.3 ng/mL (ref <0.30)

## 2011-12-03 LAB — PROTIME-INR
INR: 1.13 (ref 0.00–1.49)
Prothrombin Time: 14.7 seconds (ref 11.6–15.2)

## 2011-12-03 LAB — OCCULT BLOOD X 1 CARD TO LAB, STOOL: Fecal Occult Bld: NEGATIVE

## 2011-12-03 LAB — IRON AND TIBC
Iron: 72 ug/dL (ref 42–135)
UIBC: 353 ug/dL (ref 125–400)

## 2011-12-03 LAB — MRSA PCR SCREENING: MRSA by PCR: NEGATIVE

## 2011-12-03 LAB — HEMOGLOBIN A1C: Mean Plasma Glucose: 128 mg/dL — ABNORMAL HIGH (ref <117)

## 2011-12-03 MED ORDER — SODIUM CHLORIDE 0.9 % IV SOLN
INTRAVENOUS | Status: DC
  Administered 2011-12-03 – 2011-12-04 (×2): via INTRAVENOUS

## 2011-12-03 MED ORDER — POTASSIUM CHLORIDE 10 MEQ/100ML IV SOLN
10.0000 meq | INTRAVENOUS | Status: AC
  Administered 2011-12-03 (×4): 10 meq via INTRAVENOUS
  Filled 2011-12-03: qty 400

## 2011-12-03 MED ORDER — GADOBENATE DIMEGLUMINE 529 MG/ML IV SOLN
15.0000 mL | Freq: Once | INTRAVENOUS | Status: AC | PRN
  Administered 2011-12-03: 15 mL via INTRAVENOUS
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
## 2011-12-03 NOTE — Progress Notes (Signed)
Stroke Team Progress Note  HISTORY  HPI: Taysean Wager is an 71 y.o. male black presenting in the ER with vertigo/dizziness. The patient tells me he woke up this morning and felt like the room was spinning. He got out of bed and was unable to ambulate. He felt out of balance and fell several times as the day progressed. Patient also had nausea and vomiting. He was knocking into the wall and other objects because his vision was not "quite right". The patient did not have a headache but he did have numbness/dullness on the left side of his head and face. The patient did not have any changes in his speech. He does not believe he had any trouble with swallowing. The patient did not have any numbness or tingling anywhere else. Nor did he have any weakness anywhere.  The patient took 2 aspirins this morning but does not take aspirin on a daily basis.  SUBJECTIVE No family is at the bedside. Overall he feels his condition is unchanged.   OBJECTIVE Most recent Vital Signs: Temp: 98.1 F (36.7 C) (03/03 0300) Temp src: Oral (03/03 0300) BP: 139/80 mmHg (03/03 0300) Pulse Rate: 90  (03/03 0300) Respiratory Rate: 18 O2 Saturation: 93%  CBG (last 3)  Basename 12/03/11 0820  GLUCAP 95   Intake/Output from previous day: 03/02 0701 - 03/03 0700 In: 1472.5 [I.V.:1100; Blood:372.5] Out: 300 [Urine:300]  IV Fluid Intake:     . sodium chloride 100 mL/hr at 12/03/11 0135   Medications    . influenza  inactive virus vaccine  0.5 mL Intramuscular Tomorrow-1000  . pantoprazole (PROTONIX) IV  40 mg Intravenous Q12H  . pneumococcal 23 valent vaccine  0.5 mL Intramuscular Tomorrow-1000  . potassium chloride  10 mEq Intravenous Q1 Hr x 4  . sodium chloride  3 mL Intravenous Q12H  PRN acetaminophen, acetaminophen, acetaminophen, acetaminophen, nicotine, ondansetron (ZOFRAN) IV, ondansetron (ZOFRAN) IV, ondansetron, senna-docusate  Diet:  NPO No liquids Activity:  Bedrest  DVT Prophylaxis:   SCD'S  Significant Diagnostic Studies: CBC    Component Value Date/Time   WBC 8.9 12/03/2011 0229   RBC 3.87* 12/03/2011 0229   HGB 7.6* 12/03/2011 0229   HCT 26.7* 12/03/2011 0229   PLT 325 12/03/2011 0229   MCV 69.0* 12/03/2011 0229   MCH 19.6* 12/03/2011 0229   MCHC 28.5* 12/03/2011 0229   RDW 23.2* 12/03/2011 0229   LYMPHSABS 2.3 12/02/2011 1515   MONOABS 0.7 12/02/2011 1515   EOSABS 0.4 12/02/2011 1515   BASOSABS 0.1 12/02/2011 1515   CMP    Component Value Date/Time   NA 139 12/03/2011 0229   K 2.8* 12/03/2011 0229   CL 105 12/03/2011 0229   CO2 26 12/03/2011 0229   GLUCOSE 96 12/03/2011 0229   BUN 13 12/03/2011 0229   CREATININE 0.83 12/03/2011 0229   CALCIUM 8.9 12/03/2011 0229   PROT 7.1 12/02/2011 1515   ALBUMIN 3.3* 12/02/2011 1515   AST 14 12/02/2011 1515   ALT 6 12/02/2011 1515   ALKPHOS 64 12/02/2011 1515   BILITOT 0.3 12/02/2011 1515   GFRNONAA 86* 12/03/2011 0229   GFRAA >90 12/03/2011 0229   COAGS Lab Results  Component Value Date   INR 1.13 12/03/2011   INR 1.1 05/06/2008   INR 1.1 05/05/2008   Lipid Panel    Component Value Date/Time   CHOL 95 12/03/2011 0229   TRIG 74 12/03/2011 0229   HDL 39* 12/03/2011 0229   CHOLHDL 2.4 12/03/2011 0229   VLDL 15  12/03/2011 0229   LDLCALC 41 12/03/2011 0229   HgbA1C  No results found for this basename: HGBA1C   Urine Drug Screen    Component Value Date/Time   LABOPIA NONE DETECTED 04/30/2008 0142   COCAINSCRNUR NONE DETECTED 04/30/2008 0142   LABBENZ NONE DETECTED 04/30/2008 0142   AMPHETMU NONE DETECTED 04/30/2008 0142   THCU NONE DETECTED 04/30/2008 0142   LABBARB  Value: NONE DETECTED        DRUG SCREEN FOR MEDICAL PURPOSES ONLY.  IF CONFIRMATION IS NEEDED FOR ANY PURPOSE, NOTIFY LAB WITHIN 5 DAYS. 04/30/2008 0142    Alcohol Level    Component Value Date/Time   ETH  Value: 188        LOWEST DETECTABLE LIMIT FOR SERUM ALCOHOL IS 11 mg/dL FOR MEDICAL PURPOSES ONLY* 04/30/2008 0130     Results for orders placed during the hospital encounter of 12/02/11 (from the past  24 hour(s))  CBC     Status: Abnormal   Collection Time   12/02/11  3:15 PM      Component Value Range   WBC 7.5  4.0 - 10.5 (K/uL)   RBC 3.55 (*) 4.22 - 5.81 (MIL/uL)   Hemoglobin 6.0 (*) 13.0 - 17.0 (g/dL)   HCT 11.9 (*) 14.7 - 52.0 (%)   MCV 65.4 (*) 78.0 - 100.0 (fL)   MCH 16.9 (*) 26.0 - 34.0 (pg)   MCHC 25.9 (*) 30.0 - 36.0 (g/dL)   RDW 82.9 (*) 56.2 - 15.5 (%)   Platelets 360  150 - 400 (K/uL)  DIFFERENTIAL     Status: Normal   Collection Time   12/02/11  3:15 PM      Component Value Range   Neutrophils Relative 55  43 - 77 (%)   Neutro Abs 4.1  1.7 - 7.7 (K/uL)   Lymphocytes Relative 30  12 - 46 (%)   Lymphs Abs 2.3  0.7 - 4.0 (K/uL)   Monocytes Relative 9  3 - 12 (%)   Monocytes Absolute 0.7  0.1 - 1.0 (K/uL)   Eosinophils Relative 5  0 - 5 (%)   Eosinophils Absolute 0.4  0.0 - 0.7 (K/uL)   Basophils Relative 1  0 - 1 (%)   Basophils Absolute 0.1  0.0 - 0.1 (K/uL)  COMPREHENSIVE METABOLIC PANEL     Status: Abnormal   Collection Time   12/02/11  3:15 PM      Component Value Range   Sodium 139  135 - 145 (mEq/L)   Potassium 2.8 (*) 3.5 - 5.1 (mEq/L)   Chloride 102  96 - 112 (mEq/L)   CO2 28  19 - 32 (mEq/L)   Glucose, Bld 102 (*) 70 - 99 (mg/dL)   BUN 14  6 - 23 (mg/dL)   Creatinine, Ser 1.30  0.50 - 1.35 (mg/dL)   Calcium 9.2  8.4 - 86.5 (mg/dL)   Total Protein 7.1  6.0 - 8.3 (g/dL)   Albumin 3.3 (*) 3.5 - 5.2 (g/dL)   AST 14  0 - 37 (U/L)   ALT 6  0 - 53 (U/L)   Alkaline Phosphatase 64  39 - 117 (U/L)   Total Bilirubin 0.3  0.3 - 1.2 (mg/dL)   GFR calc non Af Amer 84 (*) >90 (mL/min)   GFR calc Af Amer >90  >90 (mL/min)  TROPONIN I     Status: Normal   Collection Time   12/02/11  3:15 PM      Component Value Range  Troponin I <0.30  <0.30 (ng/mL)  OCCULT BLOOD, POC DEVICE     Status: Normal   Collection Time   12/02/11  4:05 PM      Component Value Range   Fecal Occult Bld NEGATIVE    TYPE AND SCREEN     Status: Normal   Collection Time   12/02/11  4:53 PM       Component Value Range   ABO/RH(D) O POS     Antibody Screen NEG     Sample Expiration 12/05/2011     Unit Number [id number]     Blood Component Type RED CELLS,LR     Unit division 00     Status of Unit ISSUED,FINAL     Transfusion Status OK TO TRANSFUSE     Crossmatch Result Compatible     Unit Number [id number]     Blood Component Type RED CELLS,LR     Unit division 00     Status of Unit ISSUED,FINAL     Transfusion Status OK TO TRANSFUSE     Crossmatch Result Compatible    PREPARE RBC (CROSSMATCH)     Status: Normal   Collection Time   12/02/11  4:53 PM      Component Value Range   Order Confirmation ORDER PROCESSED BY BLOOD BANK    URINALYSIS, ROUTINE W REFLEX MICROSCOPIC     Status: Abnormal   Collection Time   12/02/11  5:39 PM      Component Value Range   Color, Urine AMBER (*) YELLOW    APPearance TURBID (*) CLEAR    Specific Gravity, Urine 1.020  1.005 - 1.030    pH 8.0  5.0 - 8.0    Glucose, UA NEGATIVE  NEGATIVE (mg/dL)   Hgb urine dipstick NEGATIVE  NEGATIVE    Bilirubin Urine SMALL (*) NEGATIVE    Ketones, ur NEGATIVE  NEGATIVE (mg/dL)   Protein, ur NEGATIVE  NEGATIVE (mg/dL)   Urobilinogen, UA 1.0  0.0 - 1.0 (mg/dL)   Nitrite NEGATIVE  NEGATIVE    Leukocytes, UA SMALL (*) NEGATIVE   URINE MICROSCOPIC-ADD ON     Status: Normal   Collection Time   12/02/11  5:39 PM      Component Value Range   Squamous Epithelial / LPF RARE  RARE    WBC, UA 0-2  <3 (WBC/hpf)   Urine-Other AMORPHOUS URATES/PHOSPHATES    OCCULT BLOOD, POC DEVICE     Status: Normal   Collection Time   12/02/11  5:58 PM      Component Value Range   Fecal Occult Bld NEGATIVE    MRSA PCR SCREENING     Status: Normal   Collection Time   12/02/11 10:50 PM      Component Value Range   MRSA by PCR NEGATIVE  NEGATIVE   OCCULT BLOOD X 1 CARD TO LAB, STOOL     Status: Normal   Collection Time   12/02/11 11:42 PM      Component Value Range   Fecal Occult Bld NEGATIVE    CBC     Status: Abnormal    Collection Time   12/03/11  2:29 AM      Component Value Range   WBC 8.9  4.0 - 10.5 (K/uL)   RBC 3.87 (*) 4.22 - 5.81 (MIL/uL)   Hemoglobin 7.6 (*) 13.0 - 17.0 (g/dL)   HCT 08.6 (*) 57.8 - 52.0 (%)   MCV 69.0 (*) 78.0 - 100.0 (fL)   MCH 19.6 (*)  26.0 - 34.0 (pg)   MCHC 28.5 (*) 30.0 - 36.0 (g/dL)   RDW 16.1 (*) 09.6 - 15.5 (%)   Platelets 325  150 - 400 (K/uL)  BASIC METABOLIC PANEL     Status: Abnormal   Collection Time   12/03/11  2:29 AM      Component Value Range   Sodium 139  135 - 145 (mEq/L)   Potassium 2.8 (*) 3.5 - 5.1 (mEq/L)   Chloride 105  96 - 112 (mEq/L)   CO2 26  19 - 32 (mEq/L)   Glucose, Bld 96  70 - 99 (mg/dL)   BUN 13  6 - 23 (mg/dL)   Creatinine, Ser 0.45  0.50 - 1.35 (mg/dL)   Calcium 8.9  8.4 - 40.9 (mg/dL)   GFR calc non Af Amer 86 (*) >90 (mL/min)   GFR calc Af Amer >90  >90 (mL/min)  LIPID PANEL     Status: Abnormal   Collection Time   12/03/11  2:29 AM      Component Value Range   Cholesterol 95  0 - 200 (mg/dL)   Triglycerides 74  <811 (mg/dL)   HDL 39 (*) >91 (mg/dL)   Total CHOL/HDL Ratio 2.4     VLDL 15  0 - 40 (mg/dL)   LDL Cholesterol 41  0 - 99 (mg/dL)  PROTIME-INR     Status: Normal   Collection Time   12/03/11  2:29 AM      Component Value Range   Prothrombin Time 14.7  11.6 - 15.2 (seconds)   INR 1.13  0.00 - 1.49   RETICULOCYTES     Status: Abnormal   Collection Time   12/03/11  2:29 AM      Component Value Range   Retic Ct Pct 0.9  0.4 - 3.1 (%)   RBC. 3.87 (*) 4.22 - 5.81 (MIL/uL)   Retic Count, Manual 34.8  19.0 - 186.0 (K/uL)  CARDIAC PANEL(CRET KIN+CKTOT+MB+TROPI)     Status: Normal   Collection Time   12/03/11  2:30 AM      Component Value Range   Total CK 84  7 - 232 (U/L)   CK, MB 2.2  0.3 - 4.0 (ng/mL)   Troponin I <0.30  <0.30 (ng/mL)   Relative Index RELATIVE INDEX IS INVALID  0.0 - 2.5   GLUCOSE, CAPILLARY     Status: Normal   Collection Time   12/03/11  8:20 AM      Component Value Range   Glucose-Capillary 95  70 - 99  (mg/dL)   Comment 1 Notify RN      Ct Head Wo Contrast  12/02/2011  *RADIOLOGY REPORT*  Clinical Data: Dizziness.  Fell.  Weakness.  CT HEAD WITHOUT CONTRAST  Technique:  Contiguous axial images were obtained from the base of the skull through the vertex without contrast.  Comparison: CT head 04/30/2008  Findings: There is central and cortical atrophy.  Periventricular white matter changes are consistent with small vessel disease. There is no evidence for hemorrhage, mass lesion, or acute infarction. There is low attenuation within the region of the right occipital lobe, favored to represent subacute or chronic infarct.  Bone windows show no calvarial fracture.  There is mucoperiosteal thickening of the ethmoid, frontal, and maxillary sinuses.  There is atherosclerotic calcification of the internal carotid arteries.  IMPRESSION:  1.  Atrophy and small vessel disease. 2.  Subacute or chronic infarct involving the right occipital lobe. No associated hemorrhage.  Critical  test results telephoned to Ebbie Ridge at the time of interpretation on date 12/02/2011 at time 3:51 p.m.  Original Report Authenticated By: Patterson Hammersmith, M.D.   Mr Brain Wo Contrast  12/02/2011  *RADIOLOGY REPORT*  Clinical Data: Weakness.  Gait disturbance.  MRI HEAD WITHOUT CONTRAST  Technique:  Multiplanar, multiecho pulse sequences of the brain and surrounding structures were obtained according to standard protocol without intravenous contrast.  Comparison: CT 12/02/2011  Findings: Acute infarct in the right posterior cerebral artery territory.  This involves the right occipital lobe, right medial temporal lobe, and right thalamus.  Generalized atrophy.  Chronic ischemic changes in the white matter. Scattered areas of chronic micro hemorrhage are present in the brain.  These are most likely related to amyloid or chronic hypertension.  Chronic hemorrhage is present in the left cerebellum, central pons, and thalami bilaterally.  Small  areas of micro hemorrhage in the right frontal and right parietal lobe also present.  Negative for mass lesion.  Chronic sinusitis.  IMPRESSION: Acute right PCA infarct.  Atrophy and chronic ischemic changes.  Original Report Authenticated By: Camelia Phenes, M.D.    CT of the brain    IMPRESSION:  1. Atrophy and small vessel disease.  2. Subacute or chronic infarct involving the right occipital lobe.  No associated hemorrhage  CT angio  Not ordered  MRI of the brain   IMPRESSION:  Acute right PCA infarct.  Atrophy and chronic ischemic changes.   MRA of the brain  Pending also MRA of the neck  2D Echocardiogram  Pending  Carotid Doppler  Pending  CXR  Not ordered.  EKG   SINUS RHYTHM ~ normal P axis, V-rate 50- 99 VENTRICULAR PREMATURE COMPLEX ~ V complex w/ short R-R interval FIRST DEGREE AV BLOCK ~ PR >210, V-rate 50- 90 LVH WITH SECONDARY REPOLARIZATION ABNORMALITY ~ [id number] & rep abn  Physical Exam   The patient is alert and cooperative.  Neurologic exam reveals full extraocular movements, speech is normal. Visual fields are notable for a left HH.  Motor testing reveals good strength of all four extremities, with the exception that there is weakness of the left arm. The patient has flexion contracture of the wrist.  The patient has good finger-nose-finger and heel-to-shin bilaterally, but he has some difficulty with finger nose finger of the left arm. Gait was not tested.     ASSESSMENT Mr. Avian Konigsberg is a 71 y.o. 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 On aspirin 325 mg orally every day for secondary stroke prevention. The patient was not on aspirin PTA.  Stroke risk factors:  hypertension and smoking  GI bleed PTA    Hospital day # 1  TREATMENT/PLAN Continue aspirin 325 mg orally every day for secondary stroke prevention.   Stroke work up in progress.  MRA of the head and neck  2D echo  Carotid doppler  PT  and OT evaluation  The patient is NPO    Lesly Dukes
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
## 2011-12-03 NOTE — Progress Notes (Signed)
Bilateral carotid artery duplex completed.  Preliminary report is no evidence of significant ICA stenosis. 

## 2011-12-03 NOTE — Progress Notes (Signed)
Speech Language/Pathology SLP Cancellation Note ST received order for speech and language evaluation secondary to stroke.  Confirmed with RN patient passed RN swallow screen.  ST to defer Cognitive-Linguistic evaluation to 12-04-11. Moreen Fowler M.S., CCC-SLP [phone] [facility] 12/03/2011, 5:11 PM
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
## 2011-12-04 ENCOUNTER — Inpatient Hospital Stay (HOSPITAL_COMMUNITY): Payer: Medicare Other

## 2011-12-04 ENCOUNTER — Encounter (HOSPITAL_COMMUNITY)
Admission: EM | Discharge status: Against Medical Advice | Payer: Self-pay | Source: Home / Self Care | Attending: Internal Medicine

## 2011-12-04 ENCOUNTER — Encounter (HOSPITAL_COMMUNITY): Payer: Self-pay | Admitting: *Deleted

## 2011-12-04 DIAGNOSIS — J189 Pneumonia, unspecified organism: Secondary | ICD-10-CM | POA: Diagnosis present

## 2011-12-04 DIAGNOSIS — D649 Anemia, unspecified: Secondary | ICD-10-CM

## 2011-12-04 HISTORY — PX: ESOPHAGOGASTRODUODENOSCOPY: SHX5428

## 2011-12-04 LAB — BASIC METABOLIC PANEL
BUN: 10 mg/dL (ref 6–23)
Calcium: 8.5 mg/dL (ref 8.4–10.5)
GFR calc Af Amer: >90 mL/min (ref >90)
GFR calc non Af Amer: 89 mL/min — ABNORMAL LOW (ref >90)
Potassium: 3.2 mEq/L — ABNORMAL LOW (ref 3.5–5.1)
Sodium: 137 mEq/L (ref 135–145)

## 2011-12-04 LAB — CBC
HCT: 25 % — ABNORMAL LOW (ref 39.0–52.0)
Hemoglobin: 7.2 g/dL — ABNORMAL LOW (ref 13.0–17.0)
MCH: 20 pg — ABNORMAL LOW (ref 26.0–34.0)
MCH: 21 pg — ABNORMAL LOW (ref 26.0–34.0)
MCHC: 28.8 g/dL — ABNORMAL LOW (ref 30.0–36.0)
MCV: 69.4 fL — ABNORMAL LOW (ref 78.0–100.0)
Platelets: 308 10*3/uL (ref 150–400)
RBC: 4.2 MIL/uL — ABNORMAL LOW (ref 4.22–5.81)
RDW: 23.3 % — ABNORMAL HIGH (ref 11.5–15.5)
RDW: 24.2 % — ABNORMAL HIGH (ref 11.5–15.5)

## 2011-12-04 SURGERY — EGD (ESOPHAGOGASTRODUODENOSCOPY)
Anesthesia: Moderate Sedation

## 2011-12-04 MED ORDER — MIDAZOLAM HCL 10 MG/2ML IJ SOLN
INTRAMUSCULAR | Status: DC | PRN
  Administered 2011-12-04: 2 mg via INTRAVENOUS

## 2011-12-04 MED ORDER — MIDAZOLAM HCL 10 MG/2ML IJ SOLN
INTRAMUSCULAR | Status: AC
  Filled 2011-12-04: qty 2

## 2011-12-04 MED ORDER — BUTAMBEN-TETRACAINE-BENZOCAINE 2-2-14 % EX AERO
INHALATION_SPRAY | CUTANEOUS | Status: DC | PRN
  Administered 2011-12-04: 2 via TOPICAL

## 2011-12-04 MED ORDER — LEVOFLOXACIN IN D5W 750 MG/150ML IV SOLN
750.0000 mg | INTRAVENOUS | Status: DC
  Administered 2011-12-04 – 2011-12-05 (×2): 750 mg via INTRAVENOUS
  Filled 2011-12-04 (×3): qty 150

## 2011-12-04 MED ORDER — VANCOMYCIN HCL 1000 MG IV SOLR
750.0000 mg | Freq: Two times a day (BID) | INTRAVENOUS | Status: DC
  Administered 2011-12-04 – 2011-12-06 (×4): 750 mg via INTRAVENOUS
  Filled 2011-12-04 (×6): qty 750

## 2011-12-04 MED ORDER — FENTANYL NICU IV SYRINGE 50 MCG/ML
INJECTION | INTRAMUSCULAR | Status: DC | PRN
  Administered 2011-12-04 (×2): 25 ug via INTRAVENOUS

## 2011-12-04 MED ORDER — FENTANYL CITRATE 0.05 MG/ML IJ SOLN
INTRAMUSCULAR | Status: AC
  Filled 2011-12-04: qty 2

## 2011-12-04 MED ORDER — FUROSEMIDE 40 MG PO TABS
40.0000 mg | ORAL_TABLET | Freq: Once | ORAL | Status: AC
  Administered 2011-12-04: 40 mg via ORAL
  Filled 2011-12-04: qty 1

## 2011-12-04 MED ORDER — POTASSIUM CHLORIDE CRYS ER 20 MEQ PO TBCR
40.0000 meq | EXTENDED_RELEASE_TABLET | Freq: Four times a day (QID) | ORAL | Status: AC
  Administered 2011-12-04 – 2011-12-05 (×3): 40 meq via ORAL
  Filled 2011-12-04 (×3): qty 2

## 2011-12-04 MED ORDER — PANTOPRAZOLE SODIUM 40 MG PO TBEC
40.0000 mg | DELAYED_RELEASE_TABLET | Freq: Every day | ORAL | Status: DC
  Administered 2011-12-04 – 2011-12-06 (×3): 40 mg via ORAL
  Filled 2011-12-04 (×4): qty 1

## 2011-12-04 MED ORDER — HYDROCOD POLST-CHLORPHEN POLST 10-8 MG/5ML PO LQCR
5.0000 mL | Freq: Two times a day (BID) | ORAL | Status: DC | PRN
  Administered 2011-12-04 – 2011-12-06 (×3): 5 mL via ORAL
  Filled 2011-12-04 (×3): qty 5
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
## 2011-12-04 NOTE — Progress Notes (Signed)
Pt. Has been transferred to 3007 via wheelchair after going downstairs for chest x-ray. Pt. Made aware of transfer.Phone report called to [facility]

## 2011-12-04 NOTE — Progress Notes (Signed)
Physical Therapy Cancellation  Noted pt has just undergone Endoscopic procedure with sedation given.  Will defer PT session this am and attempt to see in pm as schedule allows and if pt alert.   12/04/2011 Veda Canning, PT Pager: [phone]

## 2011-12-04 NOTE — Progress Notes (Signed)
PT Cancellation Note  Treatment cancelled this afternoon due to 1) pt eating late lunch (1415) and 2) refused on return at 1440 due to wants to sleep.   Jun Rightmyer 12/04/2011, 2:42 PM Pager [phone]

## 2011-12-04 NOTE — Progress Notes (Signed)
Pt C/O coughing and feeling like he cannot breath.  On assessment we noted  strong non- productive cough  and  tracheal wheezing.RR 20 sat 95 % R/A. Dr Hughes Better- [place]  Page and informed. Order received for Tussionex  prn. and to call MD if developed any new symptoms developed.

## 2011-12-04 NOTE — Progress Notes (Signed)
Speech Language/Pathology SLP Cancellation Note  Treatment cancelled today due to patient receiving procedure or test this am (EGD).  [name] 12/04/2011, 2:57 PM

## 2011-12-04 NOTE — Progress Notes (Signed)
IM Attending  Visited and discussed patient with housestaff.  Have reviewed Mr. Vincent Decree MS IV note.  He is receiving rehab eval for stroke.  His GI bleed is under eval by GI.  UGI showed erosive gastritis.  May need colonoscopy as well.  Agree with management.

## 2011-12-04 NOTE — Interval H&P Note (Signed)
History and Physical Interval Note:  12/04/2011 10:12 AM  [name]  has presented today for surgery, with the diagnosis of anemia, fe deficiency, and dysphagia,epigastric pain  The various methods of treatment have been discussed with the patient and family. After consideration of risks, benefits and other options for treatment, the patient has consented to  Procedure(s) (LRB): ESOPHAGOGASTRODUODENOSCOPY (EGD) (N/A) as a surgical intervention .  The patients' history has been reviewed, patient examined, no change in status, stable for surgery.  I have reviewed the patients' chart and labs.  Questions were answered to the patient's satisfaction.     Yancey Flemings

## 2011-12-04 NOTE — Discharge Instructions (Signed)
STROKE/TIA DISCHARGE INSTRUCTIONS SMOKING Cigarette smoking nearly doubles your risk of having a stroke & is the single most alterable risk factor  If you smoke or have smoked in the last 12 months, you are advised to quit smoking for your health.  Most of the excess cardiovascular risk related to smoking disappears within a year of stopping.  Ask you doctor about anti-smoking medications  [place] Quit Line: 1-800-QUIT NOW  Free Smoking Cessation Classes [phone]  CHOLESTEROL Know your levels; limit fat & cholesterol in your diet  Lipid Panel     Component Value Date/Time   CHOL 95 12/03/2011 0229   TRIG 74 12/03/2011 0229   HDL 39* 12/03/2011 0229   CHOLHDL 2.4 12/03/2011 0229   VLDL 15 12/03/2011 0229   LDLCALC 41 12/03/2011 0229      Many patients benefit from treatment even if their cholesterol is at goal.  Goal: Total Cholesterol (CHOL) less than 160  Goal:  Triglycerides (TRIG) less than 150  Goal:  HDL greater than 40  Goal:  LDL (LDLCALC) less than 100   BLOOD PRESSURE American Stroke Association blood pressure target is less that 120/80 mm/Hg  Your discharge blood pressure is:  BP: 145/82 mmHg  Monitor your blood pressure  Limit your salt and alcohol intake  Many individuals will require more than one medication for high blood pressure  DIABETES (A1c is a blood sugar average for last 3 months) Goal HGBA1c is under 7% (HBGA1c is blood sugar average for last 3 months)  Diabetes: {STROKE DC DIABETES:22357}    Lab Results  Component Value Date   HGBA1C 6.1* 12/03/2011     Your HGBA1c can be lowered with medications, healthy diet, and exercise.  Check your blood sugar as directed by your physician  Call your physician if you experience unexplained or low blood sugars.  PHYSICAL ACTIVITY/REHABILITATION Goal is 30 minutes at least 4 days per week    {STROKE DC ACTIVITY/REHAB:22359}  Activity decreases your risk of heart attack and stroke and makes your heart stronger.  It  helps control your weight and blood pressure; helps you relax and can improve your mood.  Participate in a regular exercise program.  Talk with your doctor about the best form of exercise for you (dancing, walking, swimming, cycling).  DIET/WEIGHT Goal is to maintain a healthy weight  Your discharge diet is: NPO *** liquids Your height is:  Height: 5\' 9"  (175.3 cm) Your current weight is: Weight: 67.586 kg (149 lb) Your Body Mass Index (BMI) is:  BMI (Calculated): 22   Following the type of diet specifically designed for you will help prevent another stroke.  Your goal weight range is:  ***  Your goal Body Mass Index (BMI) is 19-24.  Healthy food habits can help reduce 3 risk factors for stroke:  High cholesterol, hypertension, and excess weight.  RESOURCES Stroke/Support Group:  Call [phone]  they meet the 3rd Sunday of the month on the Rehab Unit at [facility] [place], [place] ( no meetings June, July & Aug).  STROKE EDUCATION PROVIDED/REVIEWED AND GIVEN TO PATIENT Stroke warning signs and symptoms How to activate emergency medical system (call 911). Medications prescribed at discharge. Need for follow-up after discharge. Personal risk factors for stroke. Pneumonia vaccine given:   {STROKE DC YES/NO/DATE:22363} Flu vaccine given:   {STROKE DC YES/NO/DATE:22363} My questions have been answered, the writing is legible, and I understand these instructions.  I will adhere to these goals & educational materials that have been provided  to me after my discharge from the hospital.

## 2011-12-04 NOTE — Progress Notes (Signed)
Stroke Team Progress Note  HISTORY  HPI: [name] is an 71 y.o. male black presenting in the ER with vertigo/dizziness. The patient tells me he woke up this morning and felt like the room was spinning. He got out of bed and was unable to ambulate. He felt out of balance and fell several times as the day progressed. Patient also had nausea and vomiting. He was knocking into the wall and other objects because his vision was not "quite right". The patient did not have a headache but he did have numbness/dullness on the left side of his head and face. The patient did not have any changes in his speech. He does not believe he had any trouble with swallowing. The patient did not have any numbness or tingling anywhere else. Nor did he have any weakness anywhere.  The patient took 2 aspirins this morning but does not take aspirin on a daily basis.  SUBJECTIVE No family is at the bedside. Overall he feels his condition is unchanged.   OBJECTIVE Most recent Vital Signs: Temp: 97.9 F (36.6 C) (03/04 0853) Temp src: Oral (03/04 0853) BP: 142/80 mmHg (03/04 0853) Pulse Rate: 94  (03/04 0853) Respiratory Rate: 25 O2 Saturation: 92%  CBG (last 3)   Basename 12/03/11 0820  GLUCAP 95   Intake/Output from previous day: 03/03 0701 - 03/04 0700 In: 1600 [I.V.:1600] Out: 700 [Urine:700]  IV Fluid Intake:      . sodium chloride 100 mL/hr at 12/03/11 0135  . DISCONTD: sodium chloride 100 mL/hr at 12/04/11 0600   Medications     . influenza  inactive virus vaccine  0.5 mL Intramuscular Tomorrow-1000  . pantoprazole (PROTONIX) IV  40 mg Intravenous Q12H  . pneumococcal 23 valent vaccine  0.5 mL Intramuscular Tomorrow-1000  . potassium chloride  10 mEq Intravenous Q1 Hr x 4  . sodium chloride  3 mL Intravenous Q12H  PRN acetaminophen, acetaminophen, acetaminophen, acetaminophen, chlorpheniramine-HYDROcodone, gadobenate dimeglumine, nicotine, ondansetron (ZOFRAN) IV, ondansetron (ZOFRAN) IV,  ondansetron, senna-docusate  Diet:  NPO No liquids Activity:  Bedrest  DVT Prophylaxis:  SCD'S  Significant Diagnostic Studies: CBC    Component Value Date/Time   WBC 12.9* 12/04/2011 0324   RBC 3.60* 12/04/2011 0324   HGB 7.2* 12/04/2011 0324   HCT 25.0* 12/04/2011 0324   PLT 331 12/04/2011 0324   MCV 69.4* 12/04/2011 0324   MCH 20.0* 12/04/2011 0324   MCHC 28.8* 12/04/2011 0324   RDW 23.3* 12/04/2011 0324   LYMPHSABS 2.3 12/02/2011 1515   MONOABS 0.7 12/02/2011 1515   EOSABS 0.4 12/02/2011 1515   BASOSABS 0.1 12/02/2011 1515   CMP    Component Value Date/Time   NA 137 12/04/2011 0324   K 3.2* 12/04/2011 0324   CL 106 12/04/2011 0324   CO2 22 12/04/2011 0324   GLUCOSE 99 12/04/2011 0324   BUN 10 12/04/2011 0324   CREATININE 0.78 12/04/2011 0324   CALCIUM 8.5 12/04/2011 0324   PROT 7.1 12/02/2011 1515   ALBUMIN 3.3* 12/02/2011 1515   AST 14 12/02/2011 1515   ALT 6 12/02/2011 1515   ALKPHOS 64 12/02/2011 1515   BILITOT 0.3 12/02/2011 1515   GFRNONAA 89* 12/04/2011 0324   GFRAA >90 12/04/2011 0324   COAGS Lab Results  Component Value Date   INR 1.13 12/03/2011   INR 1.1 05/06/2008   INR 1.1 05/05/2008   Lipid Panel    Component Value Date/Time   CHOL 95 12/03/2011 0229   TRIG 74 12/03/2011 0229  HDL 39* 12/03/2011 0229   CHOLHDL 2.4 12/03/2011 0229   VLDL 15 12/03/2011 0229   LDLCALC 41 12/03/2011 0229   HgbA1C  Lab Results  Component Value Date   HGBA1C 6.1* 12/03/2011   Urine Drug Screen    Component Value Date/Time   LABOPIA NONE DETECTED 04/30/2008 0142   COCAINSCRNUR NONE DETECTED 04/30/2008 0142   LABBENZ NONE DETECTED 04/30/2008 0142   AMPHETMU NONE DETECTED 04/30/2008 0142   THCU NONE DETECTED 04/30/2008 0142   LABBARB  Value: NONE DETECTED        DRUG SCREEN FOR MEDICAL PURPOSES ONLY.  IF CONFIRMATION IS NEEDED FOR ANY PURPOSE, NOTIFY LAB WITHIN 5 DAYS. 04/30/2008 0142    Alcohol Level    Component Value Date/Time   ETH  Value: 188        LOWEST DETECTABLE LIMIT FOR SERUM ALCOHOL IS 11 mg/dL FOR MEDICAL PURPOSES  ONLY* 04/30/2008 0130     Results for orders placed during the hospital encounter of 12/02/11 (from the past 24 hour(s))  CARDIAC PANEL(CRET KIN+CKTOT+MB+TROPI)     Status: Normal   Collection Time   12/03/11 10:30 AM      Component Value Range   Total CK 85  7 - 232 (U/L)   CK, MB 2.2  0.3 - 4.0 (ng/mL)   Troponin I <0.30  <0.30 (ng/mL)   Relative Index RELATIVE INDEX IS INVALID  0.0 - 2.5   CBC     Status: Abnormal   Collection Time   12/03/11  2:30 PM      Component Value Range   WBC 7.1  4.0 - 10.5 (K/uL)   RBC 3.97 (*) 4.22 - 5.81 (MIL/uL)   Hemoglobin 7.8 (*) 13.0 - 17.0 (g/dL)   HCT 16.1 (*) 09.6 - 52.0 (%)   MCV 69.8 (*) 78.0 - 100.0 (fL)   MCH 19.6 (*) 26.0 - 34.0 (pg)   MCHC 28.2 (*) 30.0 - 36.0 (g/dL)   RDW 04.5 (*) 40.9 - 15.5 (%)   Platelets 329  150 - 400 (K/uL)  BASIC METABOLIC PANEL     Status: Abnormal   Collection Time   12/03/11  2:30 PM      Component Value Range   Sodium 140  135 - 145 (mEq/L)   Potassium 3.2 (*) 3.5 - 5.1 (mEq/L)   Chloride 107  96 - 112 (mEq/L)   CO2 25  19 - 32 (mEq/L)   Glucose, Bld 133 (*) 70 - 99 (mg/dL)   BUN 11  6 - 23 (mg/dL)   Creatinine, Ser 8.11  0.50 - 1.35 (mg/dL)   Calcium 8.8  8.4 - 91.4 (mg/dL)   GFR calc non Af Amer 86 (*) >90 (mL/min)   GFR calc Af Amer >90  >90 (mL/min)  CARDIAC PANEL(CRET KIN+CKTOT+MB+TROPI)     Status: Normal   Collection Time   12/03/11  6:17 PM      Component Value Range   Total CK 96  7 - 232 (U/L)   CK, MB 2.3  0.3 - 4.0 (ng/mL)   Troponin I <0.30  <0.30 (ng/mL)   Relative Index RELATIVE INDEX IS INVALID  0.0 - 2.5   CBC     Status: Abnormal   Collection Time   12/04/11  3:24 AM      Component Value Range   WBC 12.9 (*) 4.0 - 10.5 (K/uL)   RBC 3.60 (*) 4.22 - 5.81 (MIL/uL)   Hemoglobin 7.2 (*) 13.0 - 17.0 (g/dL)   HCT  25.0 (*) 39.0 - 52.0 (%)   MCV 69.4 (*) 78.0 - 100.0 (fL)   MCH 20.0 (*) 26.0 - 34.0 (pg)   MCHC 28.8 (*) 30.0 - 36.0 (g/dL)   RDW 16.1 (*) 09.6 - 15.5 (%)   Platelets 331   150 - 400 (K/uL)  BASIC METABOLIC PANEL     Status: Abnormal   Collection Time   12/04/11  3:24 AM      Component Value Range   Sodium 137  135 - 145 (mEq/L)   Potassium 3.2 (*) 3.5 - 5.1 (mEq/L)   Chloride 106  96 - 112 (mEq/L)   CO2 22  19 - 32 (mEq/L)   Glucose, Bld 99  70 - 99 (mg/dL)   BUN 10  6 - 23 (mg/dL)   Creatinine, Ser 0.45  0.50 - 1.35 (mg/dL)   Calcium 8.5  8.4 - 40.9 (mg/dL)   GFR calc non Af Amer 89 (*) >90 (mL/min)   GFR calc Af Amer >90  >90 (mL/min)  PREPARE RBC (CROSSMATCH)     Status: Normal   Collection Time   12/04/11  7:40 AM      Component Value Range   Order Confirmation ORDER PROCESSED BY BLOOD BANK      Ct Head Wo Contrast  12/02/2011  *RADIOLOGY REPORT*  Clinical Data: Dizziness.  Fell.  Weakness.  CT HEAD WITHOUT CONTRAST  Technique:  Contiguous axial images were obtained from the base of the skull through the vertex without contrast.  Comparison: CT head 04/30/2008  Findings: There is central and cortical atrophy.  Periventricular white matter changes are consistent with small vessel disease. There is no evidence for hemorrhage, mass lesion, or acute infarction. There is low attenuation within the region of the right occipital lobe, favored to represent subacute or chronic infarct.  Bone windows show no calvarial fracture.  There is mucoperiosteal thickening of the ethmoid, frontal, and maxillary sinuses.  There is atherosclerotic calcification of the internal carotid arteries.  IMPRESSION:  1.  Atrophy and small vessel disease. 2.  Subacute or chronic infarct involving the right occipital lobe. No associated hemorrhage.  Critical test results telephoned to Ebbie [facility] at the time of interpretation on date 12/02/2011 at time 3:51 p.m.  Original Report Authenticated By: Patterson Hammersmith, M.D.   Mr Angiogram Neck W Wo Contrast  12/04/2011  *RADIOLOGY REPORT*  Clinical Data:  Ill-defined vascular disease.  Acute right PCA infarction.  MRA NECK WITHOUT AND WITH  CONTRAST  Technique:  Angiographic images of the neck were obtained using MRA technique without and with intravenous contrast.  Carotid stenosis measurements (when applicable) are obtained utilizing NASCET criteria, using the distal internal carotid diameter as the denominator.  Contrast: 15mL MULTIHANCE GADOBENATE DIMEGLUMINE 529 MG/ML IV SOLN  Comparison:  12/02/2011.  05/01/2009.  Findings:  Branching pattern of the brachiocephalic vessels from the arch is normal.  No origin stenoses.  Both common carotid arteries are widely patent to their respective bifurcation.  Both carotid bifurcations appear normal without narrowing or irregularity.  The cervical internal carotid arteries are tortuous but widely patent.  Right vertebral artery is widely patent at the origin and through its course to the basilar.  The left vertebral artery shows mild stenosis at its origin and mild irregularity in the proximal 2 cm. This could be due to artifact and/or tortuosity, but stenosis in that region is possible.  Beyond that, the vessel appears widely patent.  IMPRESSION: No anterior circulation pathology demonstrated.  The  right vertebral artery appears entirely normal.  There is an irregular appearance of the proximal left vertebral artery.  This could be artifactual or could relate to true stenotic disease in that region.  MRA HEAD WITHOUT CONTRAST  Technique:  Angiographic images of the Circle of [place] were obtained using MRA technique without intravenous contrast.  Findings:  Both internal carotid arteries are widely patent into the brain.  No siphon stenosis.  The anterior and middle cerebral vessels are patent without correctable proximal stenosis, aneurysm or vascular malformation.  More distal branch vessels show atherosclerotic irregularity.  There is a large posterior communicating artery on the right.  Both vertebral arteries are widely patent to the basilar.  No basilar stenosis.  Flow is seen in both superior  cerebellar arteries.  The left posterior cerebral artery appears normal. There is no flow in the right posterior cerebral artery beyond 1 cm.  This could be due to thrombotic occlusion or embolic occlusion.  This is consistent with the region of infarction.  IMPRESSION: Distal vessel atherosclerotic irregularity the branch vessels in the anterior circulation but no major vessel occlusion or correctable proximal stenosis.  No flow seen in the right posterior cerebral artery beyond 1 cm from its origin.  This is consistent with the region of infarction. This occlusion could be due to thrombotic occlusion or embolic occlusion.  Original Report Authenticated By: Thomasenia Sales, M.D.   Mr Brain Wo Contrast  12/02/2011  *RADIOLOGY REPORT*  Clinical Data: Weakness.  Gait disturbance.  MRI HEAD WITHOUT CONTRAST  Technique:  Multiplanar, multiecho pulse sequences of the brain and surrounding structures were obtained according to standard protocol without intravenous contrast.  Comparison: CT 12/02/2011  Findings: Acute infarct in the right posterior cerebral artery territory.  This involves the right occipital lobe, right medial temporal lobe, and right thalamus.  Generalized atrophy.  Chronic ischemic changes in the white matter. Scattered areas of chronic micro hemorrhage are present in the brain.  These are most likely related to amyloid or chronic hypertension.  Chronic hemorrhage is present in the left cerebellum, central pons, and thalami bilaterally.  Small areas of micro hemorrhage in the right frontal and right parietal lobe also present.  Negative for mass lesion.  Chronic sinusitis.  IMPRESSION: Acute right PCA infarct.  Atrophy and chronic ischemic changes.  Original Report Authenticated By: Camelia Phenes, M.D.   Mr Mra Head/brain Wo Cm  12/04/2011  *RADIOLOGY REPORT*  Clinical Data:  Ill-defined vascular disease.  Acute right PCA infarction.  MRA NECK WITHOUT AND WITH CONTRAST  Technique:  Angiographic  images of the neck were obtained using MRA technique without and with intravenous contrast.  Carotid stenosis measurements (when applicable) are obtained utilizing NASCET criteria, using the distal internal carotid diameter as the denominator.  Contrast: 15mL MULTIHANCE GADOBENATE DIMEGLUMINE 529 MG/ML IV SOLN  Comparison:  12/02/2011.  05/01/2009.  Findings:  Branching pattern of the brachiocephalic vessels from the arch is normal.  No origin stenoses.  Both common carotid arteries are widely patent to their respective bifurcation.  Both carotid bifurcations appear normal without narrowing or irregularity.  The cervical internal carotid arteries are tortuous but widely patent.  Right vertebral artery is widely patent at the origin and through its course to the basilar.  The left vertebral artery shows mild stenosis at its origin and mild irregularity in the proximal 2 cm. This could be due to artifact and/or tortuosity, but stenosis in that region is possible.  Beyond that, the vessel  appears widely patent.  IMPRESSION: No anterior circulation pathology demonstrated.  The right vertebral artery appears entirely normal.  There is an irregular appearance of the proximal left vertebral artery.  This could be artifactual or could relate to true stenotic disease in that region.  MRA HEAD WITHOUT CONTRAST  Technique:  Angiographic images of the Circle of [place] were obtained using MRA technique without intravenous contrast.  Findings:  Both internal carotid arteries are widely patent into the brain.  No siphon stenosis.  The anterior and middle cerebral vessels are patent without correctable proximal stenosis, aneurysm or vascular malformation.  More distal branch vessels show atherosclerotic irregularity.  There is a large posterior communicating artery on the right.  Both vertebral arteries are widely patent to the basilar.  No basilar stenosis.  Flow is seen in both superior cerebellar arteries.  The left posterior  cerebral artery appears normal. There is no flow in the right posterior cerebral artery beyond 1 cm.  This could be due to thrombotic occlusion or embolic occlusion.  This is consistent with the region of infarction.  IMPRESSION: Distal vessel atherosclerotic irregularity the branch vessels in the anterior circulation but no major vessel occlusion or correctable proximal stenosis.  No flow seen in the right posterior cerebral artery beyond 1 cm from its origin.  This is consistent with the region of infarction. This occlusion could be due to thrombotic occlusion or embolic occlusion.  Original Report Authenticated By: Thomasenia Sales, M.D.    CT of the brain    IMPRESSION:  1. Atrophy and small vessel disease.  2. Subacute or chronic infarct involving the right occipital lobe.  No associated hemorrhage  CT angio  Not ordered  MRI of the brain   IMPRESSION:  Acute right PCA infarct.  Atrophy and chronic ischemic changes.   MRA of the brain    IMPRESSION:  No anterior circulation pathology demonstrated.  The right vertebral artery appears entirely normal.  There is an irregular appearance of the proximal left vertebral  artery. This could be artifactual or could relate to true stenotic  disease in that region.  IMPRESSION:  Distal vessel atherosclerotic irregularity the branch vessels in  the anterior circulation but no major vessel occlusion or  correctable proximal stenosis.  No flow seen in the right posterior cerebral artery beyond 1 cm  from its origin. This is consistent with the region of infarction.  This occlusion could be due to thrombotic occlusion or embolic  occlusion.    2D Echocardiogram  Pending  Carotid Doppler  Bilateral carotid artery duplex completed. Preliminary report is no evidence of significant ICA stenosis.   CXR  Not ordered.  EKG   SINUS RHYTHM ~ normal P axis, V-rate 50- 99 VENTRICULAR PREMATURE COMPLEX ~ V complex w/ short R-R interval FIRST  DEGREE AV BLOCK ~ PR >210, V-rate 50- 90 LVH WITH SECONDARY REPOLARIZATION ABNORMALITY ~ R56L/RISIII/[id number]/[id number] & rep abn  Physical Exam   The patient is alert and cooperative.  Neurologic exam reveals full extraocular movements, speech is normal. Visual fields are notable for a left HH.  Motor testing reveals good strength of all four extremities, with the exception that there is weakness of the left arm. The patient has flexion contracture of the wrist.  The patient has good finger-nose-finger and heel-to-shin bilaterally, but he has some difficulty with finger nose finger of the left arm. Gait was not tested.     ASSESSMENT [name] is a 71 y.o. 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 On aspirin 325 mg orally every day for secondary stroke prevention. The patient was not on aspirin PTA.  Stroke risk factors:  hypertension and smoking  GI bleed PTA The patient is for endoscopy today to evaluate the iron deficiency anemia. The patient has a prior history of gastritis and diverticulosis.    Hospital day # 2  TREATMENT/PLAN  The aspirin has been discontinued secondary to the history of GI bleed. In the future, if an antiplatelet agent can be used, Plavix may be a better selection.  Possible proximal disease of the left vertebral artery, possible embolus to the right posterior cerebral artery. It appears that the right posterior cerebral artery may get some contribution from the anterior circulation through the posterior communicating artery. Overall, the MR angiogram appears to be relatively unremarkable proximally.  2-D echocardiogram is pending.  PT and OT evaluation  The patient is NPO for endoscopy today.    Lesly Dukes

## 2011-12-04 NOTE — Progress Notes (Signed)
OT Note:  Pt currently out of room for procedure.  Will continue to follow and perform OT eval when appropriate. 12/04/2011 Martie Round, OTR/L Pager: [phone]

## 2011-12-04 NOTE — Op Note (Signed)
[facility] [address] [place], [place]  [address]  ENDOSCOPY PROCEDURE REPORT  PATIENT:  [name], [name]  MR#:  [id number] BIRTHDATE:  07/15/1941, 70 yrs. old  GENDER:  male  ENDOSCOPIST:  Wilhemina Bonito. Eda Keys, MD Referred by:  Marin Roberts, M.D.  PROCEDURE DATE:  12/04/2011 PROCEDURE:  EGD, diagnostic 43235 ASA CLASS:  Class III INDICATIONS:  melena, dysphagia, anemia ; colon 2005 w/ diverticulosis  MEDICATIONS:   Fentanyl 50 mcg IV, Versed 2 mg IV TOPICAL ANESTHETIC:  none  DESCRIPTION OF PROCEDURE:   After the risks benefits and alternatives of the procedure were thoroughly explained, informed consent was obtained.  The Pentax Gastroscope [id number] endoscope was introduced through the mouth and advanced to the second portion of the duodenum, without limitations.  The instrument was slowly withdrawn as the mucosa was fully examined. <<PROCEDUREIMAGES>>  Sevre ulcerative Esophagitis was found in the distal 7 cm of the esophagus.  No stigmata, active bleeding or blood seen. Retroflexed views revealed a 4cm hiatal hernia. Otherwise the examination was normal to D2.     The scope was then withdrawn from the patient and the procedure completed.  COMPLICATIONS:  None  ENDOSCOPIC IMPRESSION: 1) Severe erosive Esophagitis in the distal esophagus (explains bleeding and dysphagia) 2) Otherwise normal examination 3) No active bleeding or blood seen  RECOMMENDATIONS: 1) PPI bid for 4 weeks then DAIL INDEFINITELY 2) WILL SIGN OFF  ______________________________ Wilhemina Bonito. Eda Keys, MD  CC:  Marin Roberts, MD;   Stan Head, MD  n. eSIGNED:   Wilhemina Bonito. Eda Keys at 12/04/2011 10:45 AM  [name], [phone]

## 2011-12-04 NOTE — Progress Notes (Signed)
  Echocardiogram 2D Echocardiogram has been performed.  Juanita Laster Danay Mckellar 12/04/2011, 1:41 PM

## 2011-12-04 NOTE — Progress Notes (Signed)
Resident Co-sign Daily Note: I have seen the patient and reviewed the daily progress note by Sarajane Marek MS IV and discussed the care of the patient with them.  See below for documentation of my findings, assessment, and plans.  Subjective: He was very sleepy this morning- did not get a good sleep. He reports having productive cough but denies any nausea, vomiting, bloody BM or hematemesis.  Objective: Vital signs in last 24 hours: Filed Vitals:   12/04/11 1110 12/04/11 1120 12/04/11 1152 12/04/11 1215  BP: 144/88 142/87 144/92 153/88  Pulse:      Temp:   98.1 F (36.7 C) 98.6 F (37 C)  TempSrc:   Oral Oral  Resp: 24 30    Height:      Weight:      SpO2: 89% 87%     Physical Exam: BP 153/88  Pulse 94  Temp(Src) 98.6 F (37 C) (Oral)  Resp 30  Ht 5\' 9"  (1.753 m)  Wt 149 lb (67.586 kg)  BMI 22.00 kg/m2  SpO2 87%  General Appearance:    Alert, cooperative, no distress, appears stated age  Head:    Normocephalic, without obvious abnormality, atraumatic  Eyes:    PERRL, conjunctiva/corneas clear, EOM's intact, fundi    benign, both eyes       Ears:    Normal TM's and external ear canals, both ears  Nose:   Nares normal, septum midline, mucosa normal, no drainage    or sinus tenderness  Throat:   Lips, mucosa, and tongue normal; teeth and gums normal  Neck:   Supple, symmetrical, trachea midline, no adenopathy;       thyroid:  No enlargement/tenderness/nodules; no carotid   bruit or JVD  Back:     Symmetric, no curvature, ROM normal, no CVA tenderness  Lungs:     Bilateral basilar crackles.  Chest wall:    No tenderness or deformity  Heart:    Regular rate and rhythm, S1 and S2 normal, no murmur, rub   or gallop  Abdomen:     Soft, non-tender, bowel sounds active all four quadrants,    no masses, no organomegaly  Genitalia:    Normal male without lesion, discharge or tenderness  Rectal:    Normal tone, normal prostate, no masses or tenderness;   guaiac negative stool    Extremities:   Extremities normal, atraumatic, no cyanosis or edema  Pulses:   2+ and symmetric all extremities  Skin:   Skin color, texture, turgor normal, no rashes or lesions  Lymph nodes:   Cervical, supraclavicular, and axillary nodes normal  Neurologic:   CNII-XII intact. Normal strength, sensation and reflexes      throughout   Lab Results: Reviewed and documented in Electronic Record Micro Results: Reviewed and documented in Electronic Record Studies/Results: Reviewed and documented in Electronic Record Medications: I have reviewed the patient's current medications. Scheduled Meds:   . pantoprazole  40 mg Oral Q1200  . potassium chloride  40 mEq Oral Q6H  . sodium chloride  3 mL Intravenous Q12H  . DISCONTD: pantoprazole (PROTONIX) IV  40 mg Intravenous Q12H   Continuous Infusions:   . DISCONTD: sodium chloride 100 mL/hr at 12/04/11 0600   PRN Meds:.acetaminophen, acetaminophen, acetaminophen, acetaminophen, chlorpheniramine-HYDROcodone, gadobenate dimeglumine, nicotine, ondansetron (ZOFRAN) IV, ondansetron (ZOFRAN) IV, ondansetron, senna-docusate, DISCONTD: butamben-tetracaine-benzocaine, DISCONTD: fentaNYL, DISCONTD: midazolam Assessment/Plan: Active Problems: 1. Cough and leucocytosis; He reports of productive cough this AM, bringing up whitish- yellowish sputum but denies any hemoptysis. He  was satting in high 80% and low90% last night and was noticed to have some increased oxygen requirements. Differentials include HCAP vs aspiration pneumonia( with history of dysphagia ) versus fluid overload ( iatrogenic from blood transfusions and IV fluids). - Get a CXR - If positive for pneumonia , would start on antibiotics. - NSL - CBC in AM.  2. Iron deficiency anemia: Status post EGD that showed ulcerative  Esophagitis. - Protonix twice a day x 4 weeks -Would start on iron supplementation -Counseled on limited NSAID use and cessation of alcohol and smoking.  3. 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 MRI and MRA are consistent with Right PCA infarct. Appreciate neuro inputs! Echo shows basalinferior myocardium and moderate regurgitation. - PT/OT consult - would start antiplatelet therapy when able.  4.Dispo- Transfer to neuro- tele.        Microcytic anemia  Hypertension  Tobacco abuse  Esophageal dysphagia   LOS: 2 days   [name] 12/04/2011, 3:20 PMMedical Student Daily Progress Note  Subjective: [name] is a 71 yo male with extensive PMH who presented with R PCA stroke and an epidose of hemoptysis two days ago.  He was stable overnight without any acute events.  He had some non-productive cough and trouble breathing, but maintained O2 sat in mid 90s.  He also had epigastric pain during the night, but he denies abdominal pain, GI/GU symptoms.  Had a non-bloody BM last night per patient.  His CBC at 0230 was down to 7.2 from 7.6/7.8 yesterday.  Pt is followed by Neuro and was seen by GI.  They performed an EGD this morning that showed severe ulcerative esophagitis.  He rec'd 1 unit of pRBCs this AM.    Objective: Vital signs in last 24 hours: Filed Vitals:   12/03/11 1600 12/03/11 1947 12/04/11 0005 12/04/11 0350  BP: 138/67 151/85 131/75 142/80  Pulse: 107 89 91 94  Temp: 97.2 F (36.2 C) 98.1 F (36.7 C) 97.9 F (36.6 C)   TempSrc: Oral Oral Oral   Resp: 22 23 22 25   Height:      Weight:      SpO2: 68% 91% 96% 92%   Weight change:   Intake/Output Summary (Last 24 hours) at 12/04/11 0839 Last data filed at 12/04/11 0600  Gross per 24 hour  Intake   1500 ml  Output    700 ml  Net    800 ml   Physical Exam: BP 153/88  Pulse 94  Temp(Src) 98.6 F (37 C) (Oral)  Resp 30  Ht 5\' 9"  (1.753 m)  Wt 67.586 kg (149 lb)  BMI 22.00 kg/m2  SpO2 87% General appearance: alert, cooperative and distracted Head: Normocephalic, without obvious abnormality, atraumatic Eyes:  positive findings: sclera clear and visual field defect in the left upper and lower fields. On dual simultaneous visual field exam, patient is unable to see in the left VF.  EOMI intact, pupils are constricted at 2-61mm. Lungs: diminished breath sounds anterior - left, LUL, posterior - left and CTA in RUL, RML and RLL Heart: regular rate and rhythm, S1, S2 normal, no murmur, click, rub or gallop Abdomen: soft, non-tender; bowel sounds normal; no masses,  no organomegaly Neurologic: Alert and oriented X 3, normal strength and tone. Normal symmetric reflexes. Normal coordination and gait Cranial nerves: II: visual field normal, II: pupils equal, round, reactive to light and accommodation Lab Results: Basic Metabolic Panel:  Lab 12/04/11 1610  12/03/11 1430  NA 137 140  K 3.2* 3.2*  CL 106 107  CO2 22 25  GLUCOSE 99 133*  BUN 10 11  CREATININE 0.78 0.84  CALCIUM 8.5 8.8  MG -- --  PHOS -- --   Liver Function Tests:  Lab 12/02/11 1515  AST 14  ALT 6  ALKPHOS 64  BILITOT 0.3  PROT 7.1  ALBUMIN 3.3*   CBC:  Lab 12/04/11 0324 12/03/11 1430 12/02/11 1515  WBC 12.9* 7.1 --  NEUTROABS -- -- 4.1  HGB 7.2* 7.8* --  HCT 25.0* 27.7* --  MCV 69.4* 69.8* --  PLT 331 329 --   Cardiac Enzymes:  Lab 12/03/11 1817 12/03/11 1030 12/03/11 0230  CKTOTAL 96 85 84  CKMB 2.3 2.2 2.2  CKMBINDEX -- -- --  TROPONINI <0.30 <0.30 <0.30   CBG:  Lab 12/03/11 0820  GLUCAP 95   Hemoglobin A1C:  Lab 12/03/11 0229  HGBA1C 6.1*   Fasting Lipid Panel:  Lab 12/03/11 0229  CHOL 95  HDL 39*  LDLCALC 41  TRIG 74  CHOLHDL 2.4  LDLDIRECT --   Thyroid Function Tests:  Lab 12/03/11 0229  TSH 0.605  T4TOTAL --  FREET4 --  T3FREE --  THYROIDAB --   Coagulation:  Lab 12/03/11 0229  LABPROT 14.7  INR 1.13   Anemia Panel:  Lab 12/03/11 0229  VITAMINB12 578  FOLATE >20.0  FERRITIN 4*  TIBC 425  IRON 72  RETICCTPCT 0.9     Urinalysis:  Lab 12/02/11 1739  COLORURINE AMBER*    LABSPEC 1.020  PHURINE 8.0  GLUCOSEU NEGATIVE  HGBUR NEGATIVE  BILIRUBINUR SMALL*  KETONESUR NEGATIVE  PROTEINUR NEGATIVE  UROBILINOGEN 1.0  NITRITE NEGATIVE  LEUKOCYTESUR SMALL*    Micro Results: Recent Results (from the past 240 hour(s))  URINE CULTURE     Status: Normal   Collection Time   12/02/11  5:39 PM      Component Value Range Status Comment   Specimen Description URINE, CLEAN CATCH   Final    Special Requests NONE   Final    Culture  Setup Time [id number]   Final    Colony Count 70,000 COLONIES/ML   Final    Culture     Final    Value: Multiple bacterial morphotypes present, none predominant. Suggest appropriate recollection if clinically indicated.   Report Status 12/03/2011 FINAL   Final   MRSA PCR SCREENING     Status: Normal   Collection Time   12/02/11 10:50 PM      Component Value Range Status Comment   MRSA by PCR NEGATIVE  NEGATIVE  Final    Studies/Results: Ct Head Wo Ramsey  12/02/2011  *RADIOLOGY REPORT*  Clinical Data: Dizziness.  Fell.  Weakness.  CT HEAD WITHOUT Ramsey  Technique:  Contiguous axial images were obtained from the base of the skull through the vertex without Ramsey.  Comparison: CT head 04/30/2008  Findings: There is central and cortical atrophy.  Periventricular white matter changes are consistent with small vessel disease. There is no evidence for hemorrhage, mass lesion, or acute infarction. There is low attenuation within the region of the right occipital lobe, favored to represent subacute or chronic infarct.  Bone windows show no calvarial fracture.  There is mucoperiosteal thickening of the ethmoid, frontal, and maxillary sinuses.  There is atherosclerotic calcification of the internal carotid arteries.  IMPRESSION:  1.  Atrophy and small vessel disease. 2.  Subacute or chronic infarct involving the right occipital  lobe. No associated hemorrhage.  Critical test results telephoned to Ebbie Ridge at the time of interpretation on date  12/02/2011 at time 3:51 p.m.  Original Report Authenticated By: Patterson Hammersmith, M.D.   [name]  12/04/2011  *RADIOLOGY REPORT*  Clinical Data:  Ill-defined vascular disease.  Acute right PCA infarction.  MRA NECK WITHOUT AND WITH Ramsey  Technique:  Angiographic images of the neck were obtained using MRA technique without and with intravenous Ramsey.  Carotid stenosis measurements (when applicable) are obtained utilizing NASCET criteria, using the distal internal carotid diameter as the denominator.  Ramsey: 15mL MULTIHANCE GADOBENATE DIMEGLUMINE 529 MG/ML IV SOLN  Comparison:  12/02/2011.  05/01/2009.  Findings:  Branching pattern of the brachiocephalic vessels from the arch is normal.  No origin stenoses.  Both common carotid arteries are widely patent to their respective bifurcation.  Both carotid bifurcations appear normal without narrowing or irregularity.  The cervical internal carotid arteries are tortuous but widely patent.  Right vertebral artery is widely patent at the origin and through its course to the basilar.  The left vertebral artery shows mild stenosis at its origin and mild irregularity in the proximal 2 cm. This could be due to artifact and/or tortuosity, but stenosis in that region is possible.  Beyond that, the vessel appears widely patent.  IMPRESSION: No anterior circulation pathology demonstrated.  The right vertebral artery appears entirely normal.  There is an irregular appearance of the proximal left vertebral artery.  This could be artifactual or could relate to true stenotic disease in that region.  MRA HEAD WITHOUT Ramsey  Technique:  Angiographic images of the Circle of Willis were obtained using MRA technique without intravenous Ramsey.  Findings:  Both internal carotid arteries are widely patent into the brain.  No siphon stenosis.  The anterior and middle cerebral vessels are patent without correctable proximal stenosis, aneurysm or vascular  malformation.  More distal branch vessels show atherosclerotic irregularity.  There is a large posterior communicating artery on the right.  Both vertebral arteries are widely patent to the basilar.  No basilar stenosis.  Flow is seen in both superior cerebellar arteries.  The left posterior cerebral artery appears normal. There is no flow in the right posterior cerebral artery beyond 1 cm.  This could be due to thrombotic occlusion or embolic occlusion.  This is consistent with the region of infarction.  IMPRESSION: Distal vessel atherosclerotic irregularity the branch vessels in the anterior circulation but no major vessel occlusion or correctable proximal stenosis.  No flow seen in the right posterior cerebral artery beyond 1 cm from its origin.  This is consistent with the region of infarction. This occlusion could be due to thrombotic occlusion or embolic occlusion.  Original Report Authenticated By: Thomasenia Sales, M.D.   Vincent Brain Wo Ramsey  12/02/2011  *RADIOLOGY REPORT*  Clinical Data: Weakness.  Gait disturbance.  MRI HEAD WITHOUT Ramsey  Technique:  Multiplanar, multiecho pulse sequences of the brain and surrounding structures were obtained according to standard protocol without intravenous Ramsey.  Comparison: CT 12/02/2011  Findings: Acute infarct in the right posterior cerebral artery territory.  This involves the right occipital lobe, right medial temporal lobe, and right thalamus.  Generalized atrophy.  Chronic ischemic changes in the white matter. Scattered areas of chronic micro hemorrhage are present in the brain.  These are most likely related to amyloid or chronic hypertension.  Chronic hemorrhage is present in the left cerebellum, central pons, and thalami  bilaterally.  Small areas of micro hemorrhage in the right frontal and right parietal lobe also present.  Negative for mass lesion.  Chronic sinusitis.  IMPRESSION: Acute right PCA infarct.  Atrophy and chronic ischemic changes.   Original Report Authenticated By: Camelia Phenes, M.D.   Vincent Mra Head/brain Wo Cm  12/04/2011  *RADIOLOGY REPORT*  Clinical Data:  Ill-defined vascular disease.  Acute right PCA infarction.  MRA NECK WITHOUT AND WITH Ramsey  Technique:  Angiographic images of the neck were obtained using MRA technique without and with intravenous Ramsey.  Carotid stenosis measurements (when applicable) are obtained utilizing NASCET criteria, using the distal internal carotid diameter as the denominator.  Ramsey: 15mL MULTIHANCE GADOBENATE DIMEGLUMINE 529 MG/ML IV SOLN  Comparison:  12/02/2011.  05/01/2009.  Findings:  Branching pattern of the brachiocephalic vessels from the arch is normal.  No origin stenoses.  Both common carotid arteries are widely patent to their respective bifurcation.  Both carotid bifurcations appear normal without narrowing or irregularity.  The cervical internal carotid arteries are tortuous but widely patent.  Right vertebral artery is widely patent at the origin and through its course to the basilar.  The left vertebral artery shows mild stenosis at its origin and mild irregularity in the proximal 2 cm. This could be due to artifact and/or tortuosity, but stenosis in that region is possible.  Beyond that, the vessel appears widely patent.  IMPRESSION: No anterior circulation pathology demonstrated.  The right vertebral artery appears entirely normal.  There is an irregular appearance of the proximal left vertebral artery.  This could be artifactual or could relate to true stenotic disease in that region.  MRA HEAD WITHOUT Ramsey  Technique:  Angiographic images of the Circle of Willis were obtained using MRA technique without intravenous Ramsey.  Findings:  Both internal carotid arteries are widely patent into the brain.  No siphon stenosis.  The anterior and middle cerebral vessels are patent without correctable proximal stenosis, aneurysm or vascular malformation.  More distal branch vessels  show atherosclerotic irregularity.  There is a large posterior communicating artery on the right.  Both vertebral arteries are widely patent to the basilar.  No basilar stenosis.  Flow is seen in both superior cerebellar arteries.  The left posterior cerebral artery appears normal. There is no flow in the right posterior cerebral artery beyond 1 cm.  This could be due to thrombotic occlusion or embolic occlusion.  This is consistent with the region of infarction.  IMPRESSION: Distal vessel atherosclerotic irregularity the branch vessels in the anterior circulation but no major vessel occlusion or correctable proximal stenosis.  No flow seen in the right posterior cerebral artery beyond 1 cm from its origin.  This is consistent with the region of infarction. This occlusion could be due to thrombotic occlusion or embolic occlusion.  Original Report Authenticated By: Thomasenia Sales, M.D.   Medications: I have reviewed the patient's current medications. Scheduled Meds:   . influenza  inactive virus vaccine  0.5 mL Intramuscular Tomorrow-1000  . pantoprazole (PROTONIX) IV  40 mg Intravenous Q12H  . pneumococcal 23 valent vaccine  0.5 mL Intramuscular Tomorrow-1000  . potassium chloride  10 mEq Intravenous Q1 Hr x 4  . sodium chloride  3 mL Intravenous Q12H   Continuous Infusions:   . sodium chloride 100 mL/hr at 12/03/11 0135  . DISCONTD: sodium chloride 100 mL/hr at 12/04/11 0600   PRN Meds:.acetaminophen, acetaminophen, acetaminophen, acetaminophen, chlorpheniramine-HYDROcodone, gadobenate dimeglumine, nicotine, ondansetron (ZOFRAN) IV, ondansetron (ZOFRAN) IV,  ondansetron, senna-docusate Assessment/Plan:  1. Acute Right PCA infarct - Pt's symptoms and MRI are consistent with acute right PCA infarct, and neurology has been consulted. We thank them for their recs as well as SLP/PT/OT.  Swallow study normal.  Neuro exam unchanged since admission, with significant left sided visual field  defect.  -Transfer to floor status with neurotelemetry today -Hold blood thinners given GI bleed -Will look for neurology assessment/guidance  2. Acute on Chronic microcytic anemia - Patient has a history of microcytic anemia (MCVs in upper 50s), gastritis and mild erosions seen on EGD.  Today, he underwent EGD which showed severe ulcerative gastritis and a 4cm hiatal hernia. He feels better with alkaseltzer. Given recent h/o of melena (FOBT negative x 2 in ED). Prior colonoscopy showed diverticulosis.   -Iron 72 (normal 42-135), MCV 69, ferritin 4 consistent with microcytic anemia due to iron deficiency anemia. -Transfused 1 unit pRBCs today. -CBC q12, transfuse if Hb < 8 -IV protonix BID -Defer to GI for colonoscopy  3. Leukocytosis: Pt is afebrile, but WBC has increased to 12.9 since admission.  Given decrease in breath sounds on left, CXR is ordered for possible hospital acquired pneumonia. Will treat for HAP if CXR is positive.  4.Hypertension: BP stable. Continue to monitor.  5. Hypokalemia: Given Potassium x4, will recheck BMET tomorrow.  6. Tobacco abuse: Counseled him on cessation.  7. DVT: SCD's  8. Diet/Fluids - Clear liquids   9. Dispo:  Patient can be moved to floor status   LOS: 2 days   This is a Psychologist, occupational Note.  The care of the patient was discussed with Dr. Dorthula Rue and the assessment and plan formulated with their assistance.  Please see their attached note for official documentation of the daily encounter.  Allena Katz, Mehul Chimanlal 12/04/2011, 8:39 AM

## 2011-12-04 NOTE — Op Note (Signed)
EROSIVE ESOPHAGITIS. SEE PENTAX REPORT. WILL SIGN OFF.  [name]. Eda Keys., M.D. [facility] Division of Gastroenterology

## 2011-12-05 ENCOUNTER — Encounter (HOSPITAL_COMMUNITY): Payer: Self-pay | Admitting: Internal Medicine

## 2011-12-05 DIAGNOSIS — I6330 Cerebral infarction due to thrombosis of unspecified cerebral artery: Secondary | ICD-10-CM

## 2011-12-05 LAB — CBC
HCT: 30.1 % — ABNORMAL LOW (ref 39.0–52.0)
HCT: 30.5 % — ABNORMAL LOW (ref 39.0–52.0)
Hemoglobin: 9.1 g/dL — ABNORMAL LOW (ref 13.0–17.0)
MCHC: 29.6 g/dL — ABNORMAL LOW (ref 30.0–36.0)
Platelets: 285 10*3/uL (ref 150–400)
RBC: 4.29 MIL/uL (ref 4.22–5.81)
RDW: 24.4 % — ABNORMAL HIGH (ref 11.5–15.5)
WBC: 13.9 10*3/uL — ABNORMAL HIGH (ref 4.0–10.5)
WBC: 12.1 10*3/uL — ABNORMAL HIGH (ref 4.0–10.5)

## 2011-12-05 LAB — BASIC METABOLIC PANEL
CO2: 21 mEq/L (ref 19–32)
Chloride: 104 mEq/L (ref 96–112)
GFR calc Af Amer: >90 mL/min (ref >90)
Potassium: 3.9 mEq/L (ref 3.5–5.1)

## 2011-12-05 MED ORDER — FERROUS SULFATE 325 (65 FE) MG PO TABS
325.0000 mg | ORAL_TABLET | Freq: Two times a day (BID) | ORAL | Status: DC
  Administered 2011-12-05 – 2011-12-06 (×3): 325 mg via ORAL
  Filled 2011-12-05 (×4): qty 1

## 2011-12-05 NOTE — Progress Notes (Signed)
History: HPI: [name] is an 71 y.o. male black presenting in the ER with vertigo/dizziness. The patient tells me he woke up this morning and felt like the room was spinning. He got out of bed and was unable to ambulate. He felt out of balance and fell several times as the day progressed. Patient also had nausea and vomiting. He was knocking into the wall and other objects because his vision was not "quite right". The patient did not have a headache but he did have numbness/dullness on the left side of his head and face. The patient did not have any changes in his speech. He does not believe he had any trouble with swallowing. The patient did not have any numbness or tingling anywhere else. Nor did he have any weakness anywhere.  The patient took 2 aspirins this morning but does not take aspirin on a daily basis.   LSN: early 12/02/11 tPA Given: No: patient is outside the time window for TPA treatment.  Subjective: I'm doing ok.  No dizziness or vertigo this am.  No HA or VA change.  Objective: BP 138/80  Pulse 89  Temp(Src) 97.8 F (36.6 C) (Oral)  Resp 18  Ht 5\' 9"  (1.753 m)  Wt 68.4 kg (150 lb 12.7 oz)  BMI 22.27 kg/m2  SpO2 93% Telemetry:  CBGs  Basename 12/05/11 0637 12/03/11 0820  GLUCAP 106* 95   Diet: NPO No liquids  Activity: Bedrest  DVT Prophylaxis: SCD'S  Medications: Scheduled:   . furosemide  40 mg Oral Once  . levofloxacin (LEVAQUIN) IV  750 mg Intravenous Q24H  . pantoprazole  40 mg Oral Q1200  . potassium chloride  40 mEq Oral Q6H  . sodium chloride  3 mL Intravenous Q12H  . vancomycin  750 mg Intravenous Q12H  . DISCONTD: pantoprazole (PROTONIX) IV  40 mg Intravenous Q12H    Neurologic Exam: Mental Status: Alert, oriented, thought content appropriate.  Speech fluent without evidence of aphasia. Able to follow 3 step commands without difficulty. Cranial Nerves: II- Left HH III/IV/VI-Extraocular movements intact.  Pupils reactive  bilaterally. V/VII-Smile symmetric VIII-hearing grossly intact IX/X-normal gag XI-bilateral shoulder shrug XII-midline tongue extension Motor: 5/5 RUE/RLE, 4/5LUE, 5/5LLE with normal tone and bulk; except contracted fingers on Left hand, chronic. Sensory: Light touch intact throughout, bilaterally Deep Tendon Reflexes: 2+ and symmetric throughout Plantars: Downgoing bilaterally Cerebellar: Normal finger-to-nose on R, difficult on Left, mainly due to flexion contracture, normal rapid alternating movements and normal heel-to-shin test.   Lab Results: Basic Metabolic Panel:  Lab 12/05/11 1610 12/04/11 0324  NA 134* 137  K 3.9 3.2*  CL 104 106  CO2 21 22  GLUCOSE 97 99  BUN 9 10  CREATININE 0.92 0.78  CALCIUM 8.8 8.5  MG -- --  PHOS -- --   Liver Function Tests:  Lab 12/02/11 1515  AST 14  ALT 6  ALKPHOS 64  BILITOT 0.3  PROT 7.1  ALBUMIN 3.3*   CBC:  Lab 12/05/11 0310 12/04/11 1255 12/02/11 1515  WBC 13.9* 13.4* --  NEUTROABS -- -- 4.1  HGB 8.9* 8.8* --  HCT 30.1* 29.8* --  MCV 71.0* 71.0* --  PLT 285 308 --   Cardiac Enzymes:  Lab 12/03/11 1817 12/03/11 1030 12/03/11 0230  CKTOTAL 96 85 84  CKMB 2.3 2.2 2.2  CKMBINDEX -- -- --  TROPONINI <0.30 <0.30 <0.30   CBG:  Lab 12/05/11 0637 12/03/11 0820  GLUCAP 106* 95   Hemoglobin A1C:  Lab 12/03/11 0229  HGBA1C 6.1*   Fasting Lipid Panel:  Lab 12/03/11 0229  CHOL 95  HDL 39*  LDLCALC 41  TRIG 74  CHOLHDL 2.4  LDLDIRECT --   Thyroid Function Tests:  Lab 12/03/11 0229  TSH 0.605  T4TOTAL --  FREET4 --  T3FREE --  THYROIDAB --   Coagulation:  Lab 12/03/11 0229  LABPROT 14.7  INR 1.13   Anemia Panel:  Lab 12/03/11 0229  VITAMINB12 578  FOLATE >20.0  FERRITIN 4*  TIBC 425  IRON 72  RETICCTPCT 0.9   Urine Drug Screen: Drugs of Abuse     Component Value Date/Time   LABOPIA NONE DETECTED 04/30/2008 0142   COCAINSCRNUR NONE DETECTED 04/30/2008 0142   LABBENZ NONE DETECTED 04/30/2008  0142   AMPHETMU NONE DETECTED 04/30/2008 0142   THCU NONE DETECTED 04/30/2008 0142   LABBARB  Value: NONE DETECTED        DRUG SCREEN FOR MEDICAL PURPOSES ONLY.  IF CONFIRMATION IS NEEDED FOR ANY PURPOSE, NOTIFY LAB WITHIN 5 DAYS. 04/30/2008 0142    Urinalysis:  Lab 12/02/11 1739  COLORURINE AMBER*  LABSPEC 1.020  PHURINE 8.0  GLUCOSEU NEGATIVE  HGBUR NEGATIVE  BILIRUBINUR SMALL*  KETONESUR NEGATIVE  PROTEINUR NEGATIVE  UROBILINOGEN 1.0  NITRITE NEGATIVE  LEUKOCYTESUR SMALL*   Study Results:  12/04/2011  CHEST - 2 VIEW  Comparison: Multiple priors, most recently chest x-ray 07/21/2011.  Findings: When compared to prior examinations, there is new diffuse interstitial prominence and patchy airspace disease, particularly in the right lung.  Blunting of both costophrenic sulci suggests the presence of small bilateral pleural effusions.  Pulmonary vasculature appears mildly engorged.  Heart size is upper limits of normal. The patient is rotated to the right on today's exam, resulting in distortion of the mediastinal contours and reduced diagnostic sensitivity and specificity for mediastinal pathology.  IMPRESSION: 1.  Interval development of widespread interstitial and patchy air space disease which is asymmetric involving the right lung to a greater extent than left.  Findings are concerning for multilobar pneumonia. 2.  Small bilateral pleural effusions. 3.  Mild pulmonary venous congestion without frank pulmonary edema.  Florencia Reasons, M.D.   12/04/2011  MRA NECK WITHOUT AND WITH CONTRAST    Findings:  Branching pattern of the brachiocephalic vessels from the arch is normal.  No origin stenoses.  Both common carotid arteries are widely patent to their respective bifurcation.  Both carotid bifurcations appear normal without narrowing or irregularity.  The cervical internal carotid arteries are tortuous but widely patent.  Right vertebral artery is widely patent at the origin and through its course  to the basilar.  The left vertebral artery shows mild stenosis at its origin and mild irregularity in the proximal 2 cm. This could be due to artifact and/or tortuosity, but stenosis in that region is possible.  Beyond that, the vessel appears widely patent.  IMPRESSION: No anterior circulation pathology demonstrated.  The right vertebral artery appears entirely normal.  There is an irregular appearance of the proximal left vertebral artery.  This could be artifactual or could relate to true stenotic disease in that region.  Thomasenia Sales, M.D.    12/04/11 MRA HEAD WITHOUT CONTRAST   Findings:  Both internal carotid arteries are widely patent into the brain.  No siphon stenosis.  The anterior and middle cerebral vessels are patent without correctable proximal stenosis, aneurysm or vascular malformation.  More distal branch vessels show atherosclerotic irregularity.  There is a large posterior communicating artery on  the right.  Both vertebral arteries are widely patent to the basilar.  No basilar stenosis.  Flow is seen in both superior cerebellar arteries.  The left posterior cerebral artery appears normal. There is no flow in the right posterior cerebral artery beyond 1 cm.  This could be due to thrombotic occlusion or embolic occlusion.  This is consistent with the region of infarction.  IMPRESSION: Distal vessel atherosclerotic irregularity the branch vessels in the anterior circulation but no major vessel occlusion or correctable proximal stenosis.  No flow seen in the right posterior cerebral artery beyond 1 cm from its origin.  This is consistent with the region of infarction. This occlusion could be due to thrombotic occlusion or embolic occlusion. Thomasenia Sales, M.D.   12/02/11 MRI HEAD w/o contrast Findings: Acute infarct in the right posterior cerebral artery  territory. This involves the right occipital lobe, right medial temporal lobe, and right thalamus.  Generalized atrophy. Chronic ischemic  changes in the white matter. Scattered areas of chronic micro hemorrhage are present in the brain. These are most likely related to amyloid or chronic  hypertension. Chronic hemorrhage is present in the left cerebellum, central pons, and thalami bilaterally. Small areas of micro hemorrhage in the right frontal and right parietal lobe also  present. Negative for mass lesion. Chronic sinusitis. IMPRESSION: Acute right PCA infarct.  Atrophy and chronic ischemic changes. Camelia Phenes, M.D.  12/02/11 HEAD CT: Findings: There is central and cortical atrophy. Periventricular white matter changes are consistent with small vessel disease. There is no evidence for hemorrhage, mass lesion, or acute infarction. There is low attenuation within the region of the right occipital lobe, favored to represent subacute or chronic infarct. Bone windows show no calvarial fracture. There is mucoperiosteal thickening of the ethmoid, frontal, and maxillary sinuses. There  is atherosclerotic calcification of the internal carotid arteries. IMPRESSION: 1. Atrophy and small vessel disease. 2. Subacute or chronic infarct involving the right occipital lobe. No associated hemorrhage: Patterson Hammersmith, M.D  12/04/11 2D ECHO: - Left ventricle: The cavity size was normal. Systolic function was normal. The estimated ejection fraction was in the range of 50% to 55%. Probable akinesis of the basalinferior myocardium. Left ventricular diastolic function parameters were normal. - Aortic valve: Trivial regurgitation. - Mitral valve: Moderate regurgitation directed eccentrically and toward the free wall. - Left atrium: The atrium was mildly dilated. - Pulmonary arteries: PA peak pressure: 38mm Hg (S). Impressions:The right ventricular systolic pressure was increased consistent with mild pulmonary hypertension.  12/03/11 Carotid Ultrasound: No significant extracranial carotid artery stenosis demonstrated. Vertebrals are patent with antegrade  flow. Duplex imaging of the brachial artery demonstrates triphasic waveforms bilaterally.  Therapies: PT: Significant gait ataxia worsened by head turns. All vestibular stimulation provokes blurred vision Rehab consult (consider INPT REHAB for post acute PT)  OT:  SLP: Pt. would benefit from short term ST to teach speech stratetgies to improve speech intelligibility in conversation. Home health SLP   Assessment Mr. Arun Herrod is a 71 y.o. male with  1. 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  Possible proximal disease of the left vertebral artery, possible embolus to the right posterior cerebral artery. It appears that the right posterior cerebral artery may get some contribution from the anterior circulation through the posterior communicating artery. Overall, the MR angiogram appears to be relatively unremarkable proximally.On aspirin 325 mg orally every day for secondary stroke prevention. The patient was not on aspirin PTA.  Stroke risk factors: hypertension and  smoking.  LDL 41 2. Erosive Esophagitis-GI bleed PTA .  EGD 12/04/11 Dr. Marina Goodell 3. Hyperglycemia- A1C 6.1 4. Tobacco Abuse 5. HTN- controlled.  Plan: 1.The aspirin has been discontinued secondary to the history of GI bleed. In the future, if an antiplatelet agent can be used, Plavix may be a better selection. Recommend contacting Dr. Marina Goodell to find out when antiplatelet therapy can be initiated. 2. Discharge planning, follow up with Dr. Pearlean Brownie in 2 months. 3. Risk factor modification 4. Tobacco recommend smoking cessation consult.   There patient has been seen and examined by Dr. Pearlean Brownie who agrees with above recommendations.   LOS: 3 days   Marya Fossa PA-C [facility] [phone] 12/05/2011  9:44 AM
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
## 2011-12-05 NOTE — Progress Notes (Signed)
Resident Co-sign Daily Note: I have seen the patient and reviewed the daily progress note by Sarajane Marek MS IV and discussed the care of the patient with them.  See below for documentation of my findings, assessment, and plans.  Subjective:  He still reports having cough and some abdominal pain but says " can I go home".   Objective: Vital signs in last 24 hours: Filed Vitals:   12/05/11 0200 12/05/11 0600 12/05/11 0905 12/05/11 1500  BP: 142/75 138/80 150/79 150/87  Pulse: 93 89 87 101  Temp: 98.2 F (36.8 C) 97.8 F (36.6 C) 97.7 F (36.5 C) 98.5 F (36.9 C)  TempSrc:   Oral Oral  Resp: 18 18 18 18   Height:      Weight:  150 lb 12.7 oz (68.4 kg)    SpO2: 94% 93% 96% 90%   Physical Exam: BP 150/87  Pulse 101  Temp(Src) 98.5 F (36.9 C) (Oral)  Resp 18  Ht 5\' 9"  (1.753 m)  Wt 150 lb 12.7 oz (68.4 kg)  BMI 22.27 kg/m2  SpO2 90%  General Appearance:    Alert, cooperative, no distress, appears stated age  Head:    Normocephalic, without obvious abnormality, atraumatic  Eyes:    PERRL, conjunctiva/corneas clear, EOM's intact, fundi    benign, both eyes       Ears:    Normal TM's and external ear canals, both ears  Nose:   Nares normal, septum midline, mucosa normal, no drainage    or sinus tenderness  Throat:   Lips, mucosa, and tongue normal; teeth and gums normal  Neck:   Supple, symmetrical, trachea midline, no adenopathy;       thyroid:  No enlargement/tenderness/nodules; no carotid   bruit or JVD  Back:     Symmetric, no curvature, ROM normal, no CVA tenderness  Lungs:     Coarse breath sounds bilaterally.  Chest wall:    No tenderness or deformity  Heart:    Regular rate and rhythm, S1 and S2 normal, no murmur, rub   or gallop  Abdomen:     Soft, tender to palpation in the epigastrium, bowel sounds active all four quadrants,    no masses, no organomegaly  Genitalia:    Normal male without lesion, discharge or tenderness  Rectal:    Normal tone, normal prostate, no  masses or tenderness;   guaiac negative stool  Extremities:   Extremities normal, atraumatic, no cyanosis or edema  Pulses:   2+ and symmetric all extremities  Skin:   Skin color, texture, turgor normal, no rashes or lesions  Lymph nodes:   Cervical, supraclavicular, and axillary nodes normal  Neurologic:   CNII-XII intact. Normal strength, sensation and reflexes      throughout   Lab Results: Reviewed and documented in Electronic Record Micro Results: Reviewed and documented in Electronic Record Studies/Results: Reviewed and documented in Electronic Record Medications: I have reviewed the patient's current medications. Scheduled Meds:   . furosemide  40 mg Oral Once  . levofloxacin (LEVAQUIN) IV  750 mg Intravenous Q24H  . pantoprazole  40 mg Oral Q1200  . potassium chloride  40 mEq Oral Q6H  . sodium chloride  3 mL Intravenous Q12H  . vancomycin  750 mg Intravenous Q12H   Continuous Infusions:  PRN Meds:.acetaminophen, acetaminophen, acetaminophen, acetaminophen, chlorpheniramine-HYDROcodone, nicotine, ondansetron (ZOFRAN) IV, ondansetron (ZOFRAN) IV, ondansetron, senna-docusate Assessment/Plan:  Active Problems: 1. Hospital acquired pneumonia: With his cough, leucocytosis and CXR and development of symptoms within  48 hours of admission , he most likely has HCAP. - Empirically treat with vancomycin and levaquin - Follow up sputum cultures.  2. Iron deficiency anemia: Status post EGD on 12/04/11 that showed ulcerative Esophagitis.  - Protonix twice a day x 4 weeks  -Would start on iron supplementation  -Counseled on limited NSAID use and cessation of alcohol and smoking.   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 MRI and MRA are consistent with Right PCA infarct. Appreciate neuro inputs! Echo shows basalinferior myocardium and moderate regurgitation.  - PT/OT recommends inpatient rehab- would get a consult. - would  start antiplatelet therapy when able. Would discuss it with GI.  4. DVT: SCD's.      LOS: 3 days   Manie Bealer 12/05/2011, 3:36 PMMedical Student Daily Progress Note  Subjective: Mr. Hallstrom is a 71 yo male with extensive PMH who presented with R PCA stroke and an epidose of hemoptysis two days ago.  He was stable overnight without any acute events.  He had some non-productive cough and trouble breathing, but maintained O2 sat in mid 90s through the night.  He had some abdominal pain associated with cough, but overall feels much better than yesterday.  He did not sleep well due to frequent Neuro checks and numerous visitors throughout the night.    Objective: Vital signs in last 24 hours: Filed Vitals:   12/04/11 2200 12/05/11 0200 12/05/11 0600 12/05/11 0905  BP: 151/83 142/75 138/80   Pulse: 107 93 89 90  Temp: 99.1 F (37.3 C) 98.2 F (36.8 C) 97.8 F (36.6 C)   TempSrc:      Resp: 20 18 18    Height:      Weight:   68.4 kg (150 lb 12.7 oz)   SpO2: 90% 94% 93% 96%   Weight change:   Intake/Output Summary (Last 24 hours) at 12/05/11 1358 Last data filed at 12/05/11 0500  Gross per 24 hour  Intake      0 ml  Output   2650 ml  Net  -2650 ml   Physical Exam: BP 138/80  Pulse 90  Temp(Src) 97.8 F (36.6 C) (Oral)  Resp 18  Ht 5\' 9"  (1.753 m)  Wt 68.4 kg (150 lb 12.7 oz)  BMI 22.27 kg/m2  SpO2 96% General appearance: alert, cooperative and distracted Head: Normocephalic, without obvious abnormality, atraumatic Eyes: positive findings: sclera clear and visual field defect in the left upper and lower fields. On dual simultaneous visual field exam, patient is unable to see in the left VF.  EOMI intact, pupils are constricted at 2-51mm. Lungs: diminished breath sounds anterior - left, LUL, posterior - left and CTA in RUL, RML and RLL Heart: regular rate and rhythm, S1, S2 normal, no murmur, click, rub or gallop Abdomen: soft, non-tender; bowel sounds normal; no masses,  no  organomegaly Neurologic: Alert, but not oriented to date (knows season, but not month or day of week), floor. Lab Results: Basic Metabolic Panel:  Lab 12/05/11 1610 12/04/11 0324  NA 134* 137  K 3.9 3.2*  CL 104 106  CO2 21 22  GLUCOSE 97 99  BUN 9 10  CREATININE 0.92 0.78  CALCIUM 8.8 8.5  MG -- --  PHOS -- --   Liver Function Tests:  Lab 12/02/11 1515  AST 14  ALT 6  ALKPHOS 64  BILITOT 0.3  PROT 7.1  ALBUMIN 3.3*   CBC:  Lab 12/05/11 0310 12/04/11 1255  12/02/11 1515  WBC 13.9* 13.4* --  NEUTROABS -- -- 4.1  HGB 8.9* 8.8* --  HCT 30.1* 29.8* --  MCV 71.0* 71.0* --  PLT 285 308 --   Cardiac Enzymes:  Lab 12/03/11 1817 12/03/11 1030 12/03/11 0230  CKTOTAL 96 85 84  CKMB 2.3 2.2 2.2  CKMBINDEX -- -- --  TROPONINI <0.30 <0.30 <0.30   CBG:  Lab 12/05/11 0637 12/03/11 0820  GLUCAP 106* 95   Hemoglobin A1C:  Lab 12/03/11 0229  HGBA1C 6.1*   Fasting Lipid Panel:  Lab 12/03/11 0229  CHOL 95  HDL 39*  LDLCALC 41  TRIG 74  CHOLHDL 2.4  LDLDIRECT --   Thyroid Function Tests:  Lab 12/03/11 0229  TSH 0.605  T4TOTAL --  FREET4 --  T3FREE --  THYROIDAB --   Coagulation:  Lab 12/03/11 0229  LABPROT 14.7  INR 1.13   Anemia Panel:  Lab 12/03/11 0229  VITAMINB12 578  FOLATE >20.0  FERRITIN 4*  TIBC 425  IRON 72  RETICCTPCT 0.9     Urinalysis:  Lab 12/02/11 1739  COLORURINE AMBER*  LABSPEC 1.020  PHURINE 8.0  GLUCOSEU NEGATIVE  HGBUR NEGATIVE  BILIRUBINUR SMALL*  KETONESUR NEGATIVE  PROTEINUR NEGATIVE  UROBILINOGEN 1.0  NITRITE NEGATIVE  LEUKOCYTESUR SMALL*    Micro Results: Recent Results (from the past 240 hour(s))  URINE CULTURE     Status: Normal   Collection Time   12/02/11  5:39 PM      Component Value Range Status Comment   Specimen Description URINE, CLEAN CATCH   Final    Special Requests NONE   Final    Culture  Setup Time [id number]   Final    Colony Count 70,000 COLONIES/ML   Final    Culture     Final     Value: Multiple bacterial morphotypes present, none predominant. Suggest appropriate recollection if clinically indicated.   Report Status 12/03/2011 FINAL   Final   MRSA PCR SCREENING     Status: Normal   Collection Time   12/02/11 10:50 PM      Component Value Range Status Comment   MRSA by PCR NEGATIVE  NEGATIVE  Final    Studies/Results: Dg Chest 2 View  12/04/2011  *RADIOLOGY REPORT*  Clinical Data: History of cough and leukocytosis.  Evaluate for pneumonia.  CHEST - 2 VIEW  Comparison: Multiple priors, most recently chest x-ray 07/21/2011.  Findings: When compared to prior examinations, there is new diffuse interstitial prominence and patchy airspace disease, particularly in the right lung.  Blunting of both costophrenic sulci suggests the presence of small bilateral pleural effusions.  Pulmonary vasculature appears mildly engorged.  Heart size is upper limits of normal. The patient is rotated to the right on today's exam, resulting in distortion of the mediastinal contours and reduced diagnostic sensitivity and specificity for mediastinal pathology.  IMPRESSION: 1.  Interval development of widespread interstitial and patchy air space disease which is asymmetric involving the right lung to a greater extent than left.  Findings are concerning for multilobar pneumonia. 2.  Small bilateral pleural effusions. 3.  Mild pulmonary venous congestion without frank pulmonary edema.  Original Report Authenticated By: Florencia Reasons, M.D.   Mr Angiogram Neck W Wo Contrast  12/04/2011  *RADIOLOGY REPORT*  Clinical Data:  Ill-defined vascular disease.  Acute right PCA infarction.  MRA NECK WITHOUT AND WITH CONTRAST  Technique:  Angiographic images of the neck were obtained using MRA technique without and with intravenous contrast.  Carotid stenosis measurements (when applicable) are obtained utilizing NASCET criteria, using the distal internal carotid diameter as the denominator.  Contrast: 15mL MULTIHANCE  GADOBENATE DIMEGLUMINE 529 MG/ML IV SOLN  Comparison:  12/02/2011.  05/01/2009.  Findings:  Branching pattern of the brachiocephalic vessels from the arch is normal.  No origin stenoses.  Both common carotid arteries are widely patent to their respective bifurcation.  Both carotid bifurcations appear normal without narrowing or irregularity.  The cervical internal carotid arteries are tortuous but widely patent.  Right vertebral artery is widely patent at the origin and through its course to the basilar.  The left vertebral artery shows mild stenosis at its origin and mild irregularity in the proximal 2 cm. This could be due to artifact and/or tortuosity, but stenosis in that region is possible.  Beyond that, the vessel appears widely patent.  IMPRESSION: No anterior circulation pathology demonstrated.  The right vertebral artery appears entirely normal.  There is an irregular appearance of the proximal left vertebral artery.  This could be artifactual or could relate to true stenotic disease in that region.  MRA HEAD WITHOUT CONTRAST  Technique:  Angiographic images of the Circle of Willis were obtained using MRA technique without intravenous contrast.  Findings:  Both internal carotid arteries are widely patent into the brain.  No siphon stenosis.  The anterior and middle cerebral vessels are patent without correctable proximal stenosis, aneurysm or vascular malformation.  More distal branch vessels show atherosclerotic irregularity.  There is a large posterior communicating artery on the right.  Both vertebral arteries are widely patent to the basilar.  No basilar stenosis.  Flow is seen in both superior cerebellar arteries.  The left posterior cerebral artery appears normal. There is no flow in the right posterior cerebral artery beyond 1 cm.  This could be due to thrombotic occlusion or embolic occlusion.  This is consistent with the region of infarction.  IMPRESSION: Distal vessel atherosclerotic irregularity  the branch vessels in the anterior circulation but no major vessel occlusion or correctable proximal stenosis.  No flow seen in the right posterior cerebral artery beyond 1 cm from its origin.  This is consistent with the region of infarction. This occlusion could be due to thrombotic occlusion or embolic occlusion.  Original Report Authenticated By: Thomasenia Sales, M.D.   Mr Mra Head/brain Wo Cm  12/04/2011  *RADIOLOGY REPORT*  Clinical Data:  Ill-defined vascular disease.  Acute right PCA infarction.  MRA NECK WITHOUT AND WITH CONTRAST  Technique:  Angiographic images of the neck were obtained using MRA technique without and with intravenous contrast.  Carotid stenosis measurements (when applicable) are obtained utilizing NASCET criteria, using the distal internal carotid diameter as the denominator.  Contrast: 15mL MULTIHANCE GADOBENATE DIMEGLUMINE 529 MG/ML IV SOLN  Comparison:  12/02/2011.  05/01/2009.  Findings:  Branching pattern of the brachiocephalic vessels from the arch is normal.  No origin stenoses.  Both common carotid arteries are widely patent to their respective bifurcation.  Both carotid bifurcations appear normal without narrowing or irregularity.  The cervical internal carotid arteries are tortuous but widely patent.  Right vertebral artery is widely patent at the origin and through its course to the basilar.  The left vertebral artery shows mild stenosis at its origin and mild irregularity in the proximal 2 cm. This could be due to artifact and/or tortuosity, but stenosis in that region is possible.  Beyond that, the vessel appears widely patent.  IMPRESSION: No anterior circulation pathology demonstrated.  The  right vertebral artery appears entirely normal.  There is an irregular appearance of the proximal left vertebral artery.  This could be artifactual or could relate to true stenotic disease in that region.  MRA HEAD WITHOUT CONTRAST  Technique:  Angiographic images of the Circle of  Willis were obtained using MRA technique without intravenous contrast.  Findings:  Both internal carotid arteries are widely patent into the brain.  No siphon stenosis.  The anterior and middle cerebral vessels are patent without correctable proximal stenosis, aneurysm or vascular malformation.  More distal branch vessels show atherosclerotic irregularity.  There is a large posterior communicating artery on the right.  Both vertebral arteries are widely patent to the basilar.  No basilar stenosis.  Flow is seen in both superior cerebellar arteries.  The left posterior cerebral artery appears normal. There is no flow in the right posterior cerebral artery beyond 1 cm.  This could be due to thrombotic occlusion or embolic occlusion.  This is consistent with the region of infarction.  IMPRESSION: Distal vessel atherosclerotic irregularity the branch vessels in the anterior circulation but no major vessel occlusion or correctable proximal stenosis.  No flow seen in the right posterior cerebral artery beyond 1 cm from its origin.  This is consistent with the region of infarction. This occlusion could be due to thrombotic occlusion or embolic occlusion.  Original Report Authenticated By: Thomasenia Sales, M.D.   12/04/11 2D ECHO: - Left ventricle: The cavity size was normal. Systolic function was normal. The estimated ejection fraction was in the range of 50% to 55%. Probable akinesis of the basalinferior myocardium. Left ventricular diastolic function parameters were normal. - Aortic valve: Trivial regurgitation. - Mitral valve: Moderate regurgitation directed eccentrically and toward the free wall. - Left atrium: The atrium was mildly dilated. - Pulmonary arteries: PA peak pressure: 38mm Hg (S). Impressions:The right ventricular systolic pressure was increased consistent with mild pulmonary hypertension.  12/03/11 Carotid Ultrasound: No significant extracranial carotid artery stenosis demonstrated. Vertebrals are  patent with antegrade flow. Duplex imaging of the brachial artery demonstrates triphasic waveforms bilaterally.  Therapies:  PT: Significant gait ataxia worsened by head turns. All vestibular stimulation provokes blurred vision Rehab consult (consider INPT REHAB for post acute PT)  OT:  SLP: Pt. would benefit from short term ST to teach speech stratetgies to improve speech intelligibility in conversation. Home health SLP   Medications: I have reviewed the patient's current medications. Scheduled Meds:    . furosemide  40 mg Oral Once  . levofloxacin (LEVAQUIN) IV  750 mg Intravenous Q24H  . pantoprazole  40 mg Oral Q1200  . potassium chloride  40 mEq Oral Q6H  . sodium chloride  3 mL Intravenous Q12H  . vancomycin  750 mg Intravenous Q12H   Continuous Infusions:  PRN Meds:.acetaminophen, acetaminophen, acetaminophen, acetaminophen, chlorpheniramine-HYDROcodone, nicotine, ondansetron (ZOFRAN) IV, ondansetron (ZOFRAN) IV, ondansetron, senna-docusate Assessment/Plan:  1. Acute Right PCA infarct - Pt's symptoms and MRI are consistent with acute right PCA infarct, and neurology has been consulted. We thank them for their recs as well as SLP/PT/OT.  Swallow study normal.  Neuro exam unchanged since admission, with significant left sided visual field defect of homonymous hemianopia.  -Will hold ASA 325mg  which was recommended by Neuro until appropriate to restart per GI recs.  Contact Dr. Marina Goodell (Pager [phone]) -Requested Consult from CIR, they recommend (Note not finalized as of yet) that the patient would benefit from inpatient rehab given his comorbid conditions.  Will consider discharge to IR  once patient is stable. -Pt will have outpatient followup in 2 months after discharge with Dr. Pearlean Brownie. -D/C Q4 hour neurological checks or per floor protocol. -Will look for neurology assessment/guidance  2. Acute on Chronic microcytic anemia - Patient has a history of microcytic anemia (MCVs in upper  50s), gastritis and mild erosions seen on prior EGD.  EGD on 3/4 showed severe ulcerative gastritis and a 4cm hiatal hernia. Recent h/o of melena but FOBT negative x 2. Prior colonoscopy showed diverticulosis.   -Iron 72 (normal 42-135), MCV 69, ferritin 4 consistent with microcytic anemia due to iron deficiency anemia.  -Transfuse if Hb < 8, 3 units to date.  Will follow with daily CBC.  Start on PO Iron supplementation. -IV protonix BID for 1 month, followed by QD indefinitely.  3. Multilobar Pneumonia: CXR findings above, c/w with multilobar PNA and some mild pulmonary congestion.  Pt remains afebfile, has experienced some abdominal pain due to his cough, but otherwise symptoms are subjectively improved from yesterday, per patient.  WBC 13.9 today.  Diuresed well with Lasix  -Patient is started on Levaquin and Vancomycin on 3/4.   -We will obtain followup with sputum gram stain and culture.  -One dose of Lasix 40mg  PO given for mild congestion, patient has diuresed 2L and is feeling better.  4.Hypertension: BP stable. Continue to monitor on tele.  5. Hypokalemia: Resolved after 4/4 runs of potassium, K is 3.9 today.  6. Tobacco abuse: Counseled him on cessation.  7. DVT: SCDs  8. Diet/Fluids - Regular  9. Dispo:  Patient floor status, may be discharged to [facility] for inpatient rehab.  We will discuss with PT/Rehab for further recommendations.   LOS: 3 days   This is a Psychologist, occupational Note.  The care of the patient was discussed with Dr. Dorthula Rue and the assessment and plan formulated with their assistance.  Please see their attached note for official documentation of the daily encounter.  Allena Katz, Mehul Chimanlal 12/05/2011, 1:58 PM

## 2011-12-05 NOTE — Plan of Care (Signed)
Problem: Phase II Progression Outcomes Goal: Able to communicate Speech-language-cognitive assessment completed.  See report for details.  Breck Coons [place].Ed [employer] [phone]  12/05/2011
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
## 2011-12-05 NOTE — Progress Notes (Addendum)
Physical Therapy Treatment   Patient Active Problem List  Diagnoses  . CVA (cerebral infarction)  . Microcytic anemia  . Hypertension  . Tobacco abuse  . Esophageal dysphagia   PT Assessment Pt continues to report vertigo with all mobility (nearly constant--typical of central vestibular disorder). Pt is unsteady on his feet and reports his girlfriend is currently a pt in this hospital.  Pt is vague when asked who could stay with him 24 hrs/day on discharge (daughters, grandchildren, brother--yet none can be with him 24/7 and ? If they can coordinate a schedule).  Pt refusing SNF for short-term rehab.  Is currently unsafe to be home alone due to decr balance, decr vision, and vestibular deficits.  Recommend Inpatient Rehab Consult.      12/05/11 0944  PT Visit Information  Last PT Received On 12/05/11  Precautions  Precautions Fall  Precaution Comments vertigo; Lt hemianopsia  Bed Mobility  Bed Mobility No (pt sitting EOB on arrival)  Transfers  Sit to Stand 4: Min assist;Without upper extremity assist;From bed;From chair/3-in-1;From toilet (x 6)  Sit to Stand Details (indicate cue type and reason) pt uses wide base of support to steady himself; good, slow control of ascent  Stand to Sit 4: Min assist;Without upper extremity assist;To bed;To chair/3-in-1;To toilet (x 6)  Stand to Sit Details wide base of support; uses anterior weight-shift and slow descent to control balance  Ambulation/Gait  Ambulation/Gait Assistance 4: Min assist  Ambulation/Gait Assistance Details (indicate cue type and reason) wide base of support; ran into countertop and doorframe on Lt side due to decr vision; unsteady requiring min assist  Ambulation Distance (Feet) 150 Feet  Assistive device None  Gait Pattern Decreased trunk rotation;Decreased hip/knee flexion - right;Decreased hip/knee flexion - left;Step-through pattern;Decreased stride length  Stairs Yes  Stairs Assistance 4: Min assist  Stair  Management Technique No rails;Alternating pattern;Forwards  Number of Stairs 5  (3 x1; 2x 1)  Balance  Balance Assessed Yes  Static Sitting Balance  Static Sitting - Balance Support No upper extremity supported;Feet supported  Static Sitting - Level of Assistance 5: Stand by assistance (decr safety judgement with trying to stand impulsively)  Static Standing Balance  Static Standing - Balance Support No upper extremity supported  Static Standing - Level of Assistance 4: Min assist (pt uses wide BOS (wider than shoulder width))  Static Standing - Comment/# of Minutes can maintain with eyes open and incr BOS; + ant/post sway  Rhomberg - Eyes Opened 0  (required mod assist to attempt position with LOB)  Exercises  Exercises Other exercises  Other Exercises  Other Exercises Pt instructed in x 1 exercises for gaze stability/decreasing vertigo. Pt able to perform VERY slowly with eyes on target and maintaining it in focus. He notes part of the "A" disappears when he turns head to the Rt (is using his Lt field of vision). Instructed to do 4-5 times per day for 30-60 seconds and to work on increasing speed  PT - End of Session  Equipment Utilized During Treatment Gait belt  Activity Tolerance Patient tolerated treatment well (SaO2 >95 % on room air)  Patient left in chair;with call bell in reach  Nurse Communication Mobility status for transfers;Mobility status for ambulation  General  Behavior During Session [facility] for tasks performed  Cognition Impaired (decr safety judgement/awareness of deficits)  PT - Assessment/Plan  Comments on Treatment Session Pt continues to report vertigo with all mobility (nearly constant--typical of central vestibular disorder). Pt is unsteady  on his feet and reports his girlfriend is currently a pt in this hospital.  Pt is vague when asked who could stay with him 24 hrs/day on discharge (daughters, grandchildren, brother--yet none can be with him 24/7).  Pt refusing  SNF for short-term rehab.  Is currently unsafe to be home alone due to decr balance, decr vision, and vestibular deficits.  PT Plan Discharge plan remains appropriate  PT Frequency Min 4X/week  Recommendations for Other Services Rehab consult  Follow Up Recommendations Inpatient Rehab;Supervision/Assistance - 24 hour;Supervision for mobility/OOB  Equipment Recommended Defer to next venue  Acute Rehab PT Goals  PT Goal: Sit to Stand - Progress Progressing toward goal  PT Goal: Stand to Sit - Progress Progressing toward goal  PT Goal: Stand - Progress Progressing toward goal  PT Goal: Ambulate - Progress Progressing toward goal  PT Goal: Perform Home Exercise Program - Progress Progressing toward goal     12/05/2011 Veda Canning, PT Pager: [phone]

## 2011-12-06 LAB — CBC
Hemoglobin: 9.6 g/dL — ABNORMAL LOW (ref 13.0–17.0)
MCHC: 29.5 g/dL — ABNORMAL LOW (ref 30.0–36.0)
MCV: 70.2 fL — ABNORMAL LOW (ref 78.0–100.0)
Platelets: 328 10*3/uL (ref 150–400)
RBC: 4.58 MIL/uL (ref 4.22–5.81)
RDW: 24.8 % — ABNORMAL HIGH (ref 11.5–15.5)
WBC: 10.5 10*3/uL (ref 4.0–10.5)
WBC: 10.9 10*3/uL — ABNORMAL HIGH (ref 4.0–10.5)

## 2011-12-06 LAB — TYPE AND SCREEN
ABO/RH(D): O POS
Antibody Screen: NEGATIVE
Unit division: 0
Unit division: 0
Unit division: 0
Unit division: 0

## 2011-12-06 LAB — BASIC METABOLIC PANEL
GFR calc non Af Amer: 87 mL/min — ABNORMAL LOW (ref >90)
Glucose, Bld: 120 mg/dL — ABNORMAL HIGH (ref 70–99)
Potassium: 3.4 mEq/L — ABNORMAL LOW (ref 3.5–5.1)
Sodium: 135 mEq/L (ref 135–145)

## 2011-12-06 LAB — EXPECTORATED SPUTUM ASSESSMENT W GRAM STAIN, RFLX TO RESP C

## 2011-12-06 LAB — GLUCOSE, CAPILLARY

## 2011-12-06 MED ORDER — LEVOFLOXACIN 750 MG PO TABS
750.0000 mg | ORAL_TABLET | Freq: Every day | ORAL | Status: DC
  Filled 2011-12-06: qty 1

## 2011-12-06 MED ORDER — LEVOFLOXACIN 750 MG PO TABS: 750.0000 mg | ORAL_TABLET | Freq: Every day | ORAL | Status: AC

## 2011-12-06 MED ORDER — FERROUS SULFATE 325 (65 FE) MG PO TABS: 325.0000 mg | ORAL_TABLET | Freq: Three times a day (TID) | ORAL | Status: DC

## 2011-12-06 MED ORDER — PANTOPRAZOLE SODIUM 40 MG PO TBEC: 40.0000 mg | DELAYED_RELEASE_TABLET | Freq: Two times a day (BID) | ORAL | Status: DC

## 2011-12-06 MED ORDER — HYDROCHLOROTHIAZIDE 12.5 MG PO TABS: 12.5000 mg | ORAL_TABLET | Freq: Every day | ORAL | Status: DC

## 2011-12-06 MED ORDER — NICOTINE 14 MG/24HR TD PT24: 1.0000 | MEDICATED_PATCH | TRANSDERMAL | Status: AC

## 2011-12-06 MED ORDER — SENNOSIDES-DOCUSATE SODIUM 8.6-50 MG PO TABS: 1.0000 | ORAL_TABLET | Freq: Every evening | ORAL | Status: DC | PRN

## 2011-12-06 NOTE — Plan of Care (Signed)
Problem: Phase II Progression Outcomes Goal: Tolerating diet Outcome: Progressing Patient reports liking his current diet but needs to be encouraged to eat sufficient amounts of ordered diets.  May also require some assistance/set up for meals d/t contracture of left hand.

## 2011-12-06 NOTE — Discharge Summary (Signed)
Internal Medicine Teaching [facility] Discharge Note  Name: [name] MRN: [id number] DOB: Mar 24, 1941 71 y.o.  Date of Admission: 12/02/2011  1:42 PM Date of Discharge: 12/06/2011 Attending Physician: Ulyess Mort, MD  Discharge Diagnosis:  Active Problems:  1.CVA (cerebral infarction)  2. Hospital acquired pneumonia  3. Microcytic anemia from GI bleed.  4. Hypertension  5. Tobacco abuse    Discharge Medications: Patient left AMA- no meds could be given  Disposition and follow-up:  He left AMA.  Consultations:  Neurology-                              Gastroenterology  Procedures Performed:  Dg Chest 2 View  12/04/2011  *RADIOLOGY REPORT*  Clinical Data: History of cough and leukocytosis.  Evaluate for pneumonia.  CHEST - 2 VIEW  Comparison: Multiple priors, most recently chest x-ray 07/21/2011.  Findings: When compared to prior examinations, there is new diffuse interstitial prominence and patchy airspace disease, particularly in the right lung.  Blunting of both costophrenic sulci suggests the presence of small bilateral pleural effusions.  Pulmonary vasculature appears mildly engorged.  Heart size is upper limits of normal. The patient is rotated to the right on today's exam, resulting in distortion of the mediastinal contours and reduced diagnostic sensitivity and specificity for mediastinal pathology.  IMPRESSION: 1.  Interval development of widespread interstitial and patchy air space disease which is asymmetric involving the right lung to a greater extent than left.  Findings are concerning for multilobar pneumonia. 2.  Small bilateral pleural effusions. 3.  Mild pulmonary venous congestion without frank pulmonary edema.  Original Report Authenticated By: Florencia Reasons, M.D.   Ct Head Wo Contrast  12/02/2011  *RADIOLOGY REPORT*  Clinical Data: Dizziness.  Fell.  Weakness.  CT HEAD WITHOUT CONTRAST  Technique:  Contiguous axial images were obtained from the base of the  skull through the vertex without contrast.  Comparison: CT head 04/30/2008  Findings: There is central and cortical atrophy.  Periventricular white matter changes are consistent with small vessel disease. There is no evidence for hemorrhage, mass lesion, or acute infarction. There is low attenuation within the region of the right occipital lobe, favored to represent subacute or chronic infarct.  Bone windows show no calvarial fracture.  There is mucoperiosteal thickening of the ethmoid, frontal, and maxillary sinuses.  There is atherosclerotic calcification of the internal carotid arteries.  IMPRESSION:  1.  Atrophy and small vessel disease. 2.  Subacute or chronic infarct involving the right occipital lobe. No associated hemorrhage.  Critical test results telephoned to Ebbie Ridge at the time of interpretation on date 12/02/2011 at time 3:51 p.m.  Original Report Authenticated By: Patterson Hammersmith, M.D.   Mr Angiogram Neck W Wo Contrast  12/04/2011  *RADIOLOGY REPORT*  Clinical Data:  Ill-defined vascular disease.  Acute right PCA infarction.  MRA NECK WITHOUT AND WITH CONTRAST  Technique:  Angiographic images of the neck were obtained using MRA technique without and with intravenous contrast.  Carotid stenosis measurements (when applicable) are obtained utilizing NASCET criteria, using the distal internal carotid diameter as the denominator.  Contrast: 15mL MULTIHANCE GADOBENATE DIMEGLUMINE 529 MG/ML IV SOLN  Comparison:  12/02/2011.  05/01/2009.  Findings:  Branching pattern of the brachiocephalic vessels from the arch is normal.  No origin stenoses.  Both common carotid arteries are widely patent to their respective bifurcation.  Both carotid bifurcations appear normal without narrowing or irregularity.  The  cervical internal carotid arteries are tortuous but widely patent.  Right vertebral artery is widely patent at the origin and through its course to the basilar.  The left vertebral artery shows mild  stenosis at its origin and mild irregularity in the proximal 2 cm. This could be due to artifact and/or tortuosity, but stenosis in that region is possible.  Beyond that, the vessel appears widely patent.  IMPRESSION: No anterior circulation pathology demonstrated.  The right vertebral artery appears entirely normal.  There is an irregular appearance of the proximal left vertebral artery.  This could be artifactual or could relate to true stenotic disease in that region.  MRA HEAD WITHOUT CONTRAST  Technique:  Angiographic images of the Circle of Willis were obtained using MRA technique without intravenous contrast.  Findings:  Both internal carotid arteries are widely patent into the brain.  No siphon stenosis.  The anterior and middle cerebral vessels are patent without correctable proximal stenosis, aneurysm or vascular malformation.  More distal branch vessels show atherosclerotic irregularity.  There is a large posterior communicating artery on the right.  Both vertebral arteries are widely patent to the basilar.  No basilar stenosis.  Flow is seen in both superior cerebellar arteries.  The left posterior cerebral artery appears normal. There is no flow in the right posterior cerebral artery beyond 1 cm.  This could be due to thrombotic occlusion or embolic occlusion.  This is consistent with the region of infarction.  IMPRESSION: Distal vessel atherosclerotic irregularity the branch vessels in the anterior circulation but no major vessel occlusion or correctable proximal stenosis.  No flow seen in the right posterior cerebral artery beyond 1 cm from its origin.  This is consistent with the region of infarction. This occlusion could be due to thrombotic occlusion or embolic occlusion.  Original Report Authenticated By: Thomasenia Sales, M.D.   Mr Brain Wo Contrast  12/02/2011  *RADIOLOGY REPORT*  Clinical Data: Weakness.  Gait disturbance.  MRI HEAD WITHOUT CONTRAST  Technique:  Multiplanar, multiecho pulse  sequences of the brain and surrounding structures were obtained according to standard protocol without intravenous contrast.  Comparison: CT 12/02/2011  Findings: Acute infarct in the right posterior cerebral artery territory.  This involves the right occipital lobe, right medial temporal lobe, and right thalamus.  Generalized atrophy.  Chronic ischemic changes in the white matter. Scattered areas of chronic micro hemorrhage are present in the brain.  These are most likely related to amyloid or chronic hypertension.  Chronic hemorrhage is present in the left cerebellum, central pons, and thalami bilaterally.  Small areas of micro hemorrhage in the right frontal and right parietal lobe also present.  Negative for mass lesion.  Chronic sinusitis.  IMPRESSION: Acute right PCA infarct.  Atrophy and chronic ischemic changes.  Original Report Authenticated By: Camelia Phenes, M.D.   Mr Mra Head/brain Wo Cm  12/04/2011  *RADIOLOGY REPORT*  Clinical Data:  Ill-defined vascular disease.  Acute right PCA infarction.  MRA NECK WITHOUT AND WITH CONTRAST  Technique:  Angiographic images of the neck were obtained using MRA technique without and with intravenous contrast.  Carotid stenosis measurements (when applicable) are obtained utilizing NASCET criteria, using the distal internal carotid diameter as the denominator.  Contrast: 15mL MULTIHANCE GADOBENATE DIMEGLUMINE 529 MG/ML IV SOLN  Comparison:  12/02/2011.  05/01/2009.  Findings:  Branching pattern of the brachiocephalic vessels from the arch is normal.  No origin stenoses.  Both common carotid arteries are widely patent to their respective bifurcation.  Both carotid bifurcations appear normal without narrowing or irregularity.  The cervical internal carotid arteries are tortuous but widely patent.  Right vertebral artery is widely patent at the origin and through its course to the basilar.  The left vertebral artery shows mild stenosis at its origin and mild irregularity  in the proximal 2 cm. This could be due to artifact and/or tortuosity, but stenosis in that region is possible.  Beyond that, the vessel appears widely patent.  IMPRESSION: No anterior circulation pathology demonstrated.  The right vertebral artery appears entirely normal.  There is an irregular appearance of the proximal left vertebral artery.  This could be artifactual or could relate to true stenotic disease in that region.  MRA HEAD WITHOUT CONTRAST  Technique:  Angiographic images of the Circle of Willis were obtained using MRA technique without intravenous contrast.  Findings:  Both internal carotid arteries are widely patent into the brain.  No siphon stenosis.  The anterior and middle cerebral vessels are patent without correctable proximal stenosis, aneurysm or vascular malformation.  More distal branch vessels show atherosclerotic irregularity.  There is a large posterior communicating artery on the right.  Both vertebral arteries are widely patent to the basilar.  No basilar stenosis.  Flow is seen in both superior cerebellar arteries.  The left posterior cerebral artery appears normal. There is no flow in the right posterior cerebral artery beyond 1 cm.  This could be due to thrombotic occlusion or embolic occlusion.  This is consistent with the region of infarction.  IMPRESSION: Distal vessel atherosclerotic irregularity the branch vessels in the anterior circulation but no major vessel occlusion or correctable proximal stenosis.  No flow seen in the right posterior cerebral artery beyond 1 cm from its origin.  This is consistent with the region of infarction. This occlusion could be due to thrombotic occlusion or embolic occlusion.  Original Report Authenticated By: Thomasenia Sales, M.D.    2D Echo: Left ventricle: The cavity size was normal. Systolic function was normal. The estimated ejection fraction was in the range of 50% to 55%. Probable akinesis of the basalinferior myocardium. Left  ventricular diastolic function parameters were normal. - Aortic valve: Trivial regurgitation. - Mitral valve: Moderate regurgitation directed eccentrically and toward the free wall. - Left atrium: The atrium was mildly dilated. - Pulmonary arteries: PA peak pressure: 38mm Hg (S).     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 The patient awoke around 3am on the morning of admission, walked to the bathroom, and noticed significant blurring of vision, weakness, and dizziness, and fell to the ground. The patient got up, fell once more, hitting the back of his head, then called EMS. The patient took 2 aspirin this morning, but had previously been out of his aspirin prescription for 1 month. He notes no fevers, chills, chest pain, SOB, nausea, or vomiting. He does note an occasional non-productive cough for many years, and occasional palpitations for several years.  The patient notes a 63-month history of burning epigastric pain, with associated "heartburn" sensation in chest, relieved by tums/maalox. The patient takes occasional ibuprofen/advil for muscle aches though not daily. He notes drinking 2-4 days/week, drinking about 1/2 pint of alcohol per week, though chart review suggests heavier alcohol usage in the past, and he was noted to be intoxicated on 2 prior admissions. The patient noted one dark red stool 3  weeks ago, but no bright red blood or melena in stools since then, and no constipation. The patient's last colonoscopy was in 2005, during an admission for acute BRBPR, which showed gastritis and a few erosions in the antrum, and diverticulosis of the colon. The patient presented to the ED with a hemoglobin of 6.0.  The patient also notes a 2 month history of regurgitation of food (both solids and liquids) after eating, which is not getting better or worse. The patient has no history of  similar symptoms in the past. The patient does note weight loss recently, but he is unsure how much. No change in appetite. No dysphagia or odynophagia. No blood in his regurgitated food.   Hospital Course by problem list: 1. CVA (cerebral infarction) - Patient presented with fall and unsteady gait and was found to have a right  PCA infarct likely embolic in origin confirmed on MRI/MRA.  Neurology was consulted, and the decision  not to give TPA was appropriate given the patient was outside the time window of therapy and active upper GI bleed.  The patient had an appropriate visual field deficit of left homonymous hemianopia corresponding to his site of lesion that was still present at the time of discharge.  Neurology recommended TEE and Holter monitoring for possible embolic stroke as  2D Echo demonstrated akinesis of basal inferior myocardium as possible source of embolism.  Upper GI bleeding has stopped since admission and a low dose ASA 81mg  QD as an outpatient for secondary stroke prevention with follow up with Dr. Pearlean Brownie in 2 months will be started. As per the PT/OT consult - patient continued to be unstable on his gait and he was found to have high risk of falls. [facility] was also consulted but he was not found to be a good candidate for inpatient rehab. The plan was to d/c to SNF but he left AMA.  2. Multilobar pneumonia - Mr. Salguero had hospital acquired pneumonia demonstrated by physical exam and CXR.  He was initially started on vancomycin and levaquin and the plan was to discharge him on  PO Levaquin but he left AMA.  3. Microcytic anemia - The pt had a hemoglobin of 6.0 on admission with some upper GI bleed in the setting of longstanding dysphagia and epigastric discomfort that is mildly improved with OTC antacids.  EGD while inpatient showed severe ulcerative esophagitis with no acute bleed, and patient was started on PPI therapy BID.  He was transfused three units of pRBCs during  his hospital admission which increased hemoglobin to 9.0 on day of discharge.  He was also started on PO Iron supplementation which he should continue as an outpatient with his PPI.  GI recommends PPI BID for 1 month, then QD indefinitely.  He was counseled on the risk of alcohol use in conjunction with his history of GI bleeding 2/2 to esophagitis.  No prescription or directions could be given as he left AMA.  4.  Hypertension - BPs ranged from 138/80 to 169/97 during this admission.  He does not seem to take BP medication as an outpatient and would benefit from restarting anti-hypertensive therapy.  Plan was to initiate antihypertensive therapy with HCTZ 12.5mg  QD but he left AMA. Pt was counseled extensively on smoking cessation and alcohol cessation during this visit for his medical co-morbidities.    5.Hypokalemia - likely secondary to emesis and chronic alcoholism. Repleted during his stay.   Discharge Vitals:  BP 164/87  Pulse 89  Temp(Src) 98.6 F (37 C) (Oral)  Resp 18  Ht 5\' 9"  (1.753 m)  Wt 150 lb 12.7 oz (68.4 kg)  BMI 22.27 kg/m2  SpO2 94%  Discharge Labs:  Results for orders placed during the hospital encounter of 12/02/11 (from the past 24 hour(s))  CULTURE, SPUTUM-ASSESSMENT     Status: Normal   Collection Time   12/05/11  9:17 PM      Component Value Range   Specimen Description SPUTUM     Special Requests NONE     Sputum evaluation       Value: MICROSCOPIC FINDINGS SUGGEST THAT THIS SPECIMEN IS NOT REPRESENTATIVE OF LOWER RESPIRATORY SECRETIONS. PLEASE RECOLLECT.     CALLED TO RN T.TYLER AT 0111 12/06/11 BY L.PITT   Report Status 12/06/2011 FINAL    CBC     Status: Abnormal   Collection Time   12/06/11  5:45 AM      Component Value Range   WBC 10.5  4.0 - 10.5 (K/uL)   RBC 4.46  4.22 - 5.81 (MIL/uL)   Hemoglobin 9.3 (*) 13.0 - 17.0 (g/dL)   HCT 96.0 (*) 45.4 - 52.0 (%)   MCV 70.2 (*) 78.0 - 100.0 (fL)   MCH 20.9 (*) 26.0 - 34.0 (pg)   MCHC 29.7 (*) 30.0 - 36.0  (g/dL)   RDW 09.8 (*) 11.9 - 15.5 (%)   Platelets 328  150 - 400 (K/uL)  GLUCOSE, CAPILLARY     Status: Abnormal   Collection Time   12/06/11  8:04 AM      Component Value Range   Glucose-Capillary 105 (*) 70 - 99 (mg/dL)   Comment 1 Documented in Chart     Comment 2 Notify RN    BASIC METABOLIC PANEL     Status: Abnormal   Collection Time   12/06/11  8:24 AM      Component Value Range   Sodium 135  135 - 145 (mEq/L)   Potassium 3.4 (*) 3.5 - 5.1 (mEq/L)   Chloride 107  96 - 112 (mEq/L)   CO2 19  19 - 32 (mEq/L)   Glucose, Bld 120 (*) 70 - 99 (mg/dL)   BUN 8  6 - 23 (mg/dL)   Creatinine, Ser 1.47  0.50 - 1.35 (mg/dL)   Calcium 9.2  8.4 - 82.9 (mg/dL)   GFR calc non Af Amer 87 (*) >90 (mL/min)   GFR calc Af Amer >90  >90 (mL/min)    Signed: Tecumseh Yeagley 12/06/2011, 4:53 PM

## 2011-12-06 NOTE — Progress Notes (Signed)
History: HPI: [name] is an 71 y.o. male black presenting in the ER with vertigo/dizziness. The patient tells me he woke up this morning and felt like the room was spinning. He got out of bed and was unable to ambulate. He felt out of balance and fell several times as the day progressed. Patient also had nausea and vomiting. He was knocking into the wall and other objects because his vision was not "quite right". The patient did not have a headache but he did have numbness/dullness on the left side of his head and face. The patient did not have any changes in his speech. He does not believe he had any trouble with swallowing. The patient did not have any numbness or tingling anywhere else. Nor did he have any weakness anywhere.  The patient took 2 aspirins this morning but does not take aspirin on a daily basis.   LSN: early 12/02/11 tPA Given: No: patient is outside the time window for TPA treatment.  Subjective: I'm doing ok.  No dizziness or vertigo this am.  No HA or VA change.Echo is unremarkable.  Objective: BP 149/101  Pulse 96  Temp(Src) 98.3 F (36.8 C) (Oral)  Resp 18  Ht 5\' 9"  (1.753 m)  Wt 68.4 kg (150 lb 12.7 oz)  BMI 22.27 kg/m2  SpO2 93% Telemetry:  CBGs  Basename 12/06/11 0804 12/05/11 0637  GLUCAP 105* 106*   Diet: NPO No liquids  Activity: Bedrest  DVT Prophylaxis: SCD'S  Medications: Scheduled:    . ferrous sulfate  325 mg Oral BID WC  . levofloxacin  750 mg Oral Q2000  . pantoprazole  40 mg Oral Q1200  . sodium chloride  3 mL Intravenous Q12H  . vancomycin  750 mg Intravenous Q12H  . DISCONTD: levofloxacin (LEVAQUIN) IV  750 mg Intravenous Q24H    Neurologic Exam: Mental Status: Alert, oriented, thought content appropriate.  Speech fluent without evidence of aphasia. Able to follow 3 step commands without difficulty. Cranial Nerves: II- Left HH III/IV/VI-Extraocular movements intact.  Pupils reactive bilaterally. V/VII-Smile  symmetric VIII-hearing grossly intact IX/X-normal gag XI-bilateral shoulder shrug XII-midline tongue extension Motor: 5/5 RUE/RLE, 4/5LUE, 5/5LLE with normal tone and bulk; except contracted fingers on Left hand, chronic. Sensory: Light touch intact throughout, bilaterally Deep Tendon Reflexes: 2+ and symmetric throughout Plantars: Downgoing bilaterally Cerebellar: Normal finger-to-nose on R, difficult on Left, mainly due to flexion contracture, normal rapid alternating movements and normal heel-to-shin test.   Lab Results: Basic Metabolic Panel:  Lab 12/06/11 2952 12/05/11 0310  NA 135 134*  K 3.4* 3.9  CL 107 104  CO2 19 21  GLUCOSE 120* 97  BUN 8 9  CREATININE 0.82 0.92  CALCIUM 9.2 8.8  MG -- --  PHOS -- --   Liver Function Tests:  Lab 12/02/11 1515  AST 14  ALT 6  ALKPHOS 64  BILITOT 0.3  PROT 7.1  ALBUMIN 3.3*   CBC:  Lab 12/06/11 0545 12/05/11 1455 12/02/11 1515  WBC 10.5 12.1* --  NEUTROABS -- -- 4.1  HGB 9.3* 9.1* --  HCT 31.3* 30.5* --  MCV 70.2* 71.1* --  PLT 328 315 --   Cardiac Enzymes:  Lab 12/03/11 1817 12/03/11 1030 12/03/11 0230  CKTOTAL 96 85 84  CKMB 2.3 2.2 2.2  CKMBINDEX -- -- --  TROPONINI <0.30 <0.30 <0.30   CBG:  Lab 12/06/11 0804 12/05/11 0637 12/03/11 0820  GLUCAP 105* 106* 95   Hemoglobin A1C:  Lab 12/03/11 0229  HGBA1C 6.1*  Fasting Lipid Panel:  Lab 12/03/11 0229  CHOL 95  HDL 39*  LDLCALC 41  TRIG 74  CHOLHDL 2.4  LDLDIRECT --   Thyroid Function Tests:  Lab 12/03/11 0229  TSH 0.605  T4TOTAL --  FREET4 --  T3FREE --  THYROIDAB --   Coagulation:  Lab 12/03/11 0229  LABPROT 14.7  INR 1.13   Anemia Panel:  Lab 12/03/11 0229  VITAMINB12 578  FOLATE >20.0  FERRITIN 4*  TIBC 425  IRON 72  RETICCTPCT 0.9   Urine Drug Screen: Drugs of Abuse     Component Value Date/Time   LABOPIA NONE DETECTED 04/30/2008 0142   COCAINSCRNUR NONE DETECTED 04/30/2008 0142   LABBENZ NONE DETECTED 04/30/2008 0142    AMPHETMU NONE DETECTED 04/30/2008 0142   THCU NONE DETECTED 04/30/2008 0142   LABBARB  Value: NONE DETECTED        DRUG SCREEN FOR MEDICAL PURPOSES ONLY.  IF CONFIRMATION IS NEEDED FOR ANY PURPOSE, NOTIFY LAB WITHIN 5 DAYS. 04/30/2008 0142    Urinalysis:  Lab 12/02/11 1739  COLORURINE AMBER*  LABSPEC 1.020  PHURINE 8.0  GLUCOSEU NEGATIVE  HGBUR NEGATIVE  BILIRUBINUR SMALL*  KETONESUR NEGATIVE  PROTEINUR NEGATIVE  UROBILINOGEN 1.0  NITRITE NEGATIVE  LEUKOCYTESUR SMALL*   Study Results:  12/04/2011  CHEST - 2 VIEW  Comparison: Multiple priors, most recently chest x-ray 07/21/2011.  Findings: When compared to prior examinations, there is new diffuse interstitial prominence and patchy airspace disease, particularly in the right lung.  Blunting of both costophrenic sulci suggests the presence of small bilateral pleural effusions.  Pulmonary vasculature appears mildly engorged.  Heart size is upper limits of normal. The patient is rotated to the right on today's exam, resulting in distortion of the mediastinal contours and reduced diagnostic sensitivity and specificity for mediastinal pathology.  IMPRESSION: 1.  Interval development of widespread interstitial and patchy air space disease which is asymmetric involving the right lung to a greater extent than left.  Findings are concerning for multilobar pneumonia. 2.  Small bilateral pleural effusions. 3.  Mild pulmonary venous congestion without frank pulmonary edema.  Florencia Reasons, M.D.   12/04/2011  MRA NECK WITHOUT AND WITH CONTRAST    Findings:  Branching pattern of the brachiocephalic vessels from the arch is normal.  No origin stenoses.  Both common carotid arteries are widely patent to their respective bifurcation.  Both carotid bifurcations appear normal without narrowing or irregularity.  The cervical internal carotid arteries are tortuous but widely patent.  Right vertebral artery is widely patent at the origin and through its course to  the basilar.  The left vertebral artery shows mild stenosis at its origin and mild irregularity in the proximal 2 cm. This could be due to artifact and/or tortuosity, but stenosis in that region is possible.  Beyond that, the vessel appears widely patent.  IMPRESSION: No anterior circulation pathology demonstrated.  The right vertebral artery appears entirely normal.  There is an irregular appearance of the proximal left vertebral artery.  This could be artifactual or could relate to true stenotic disease in that region.  Thomasenia Sales, M.D.    12/04/11 MRA HEAD WITHOUT CONTRAST   Findings:  Both internal carotid arteries are widely patent into the brain.  No siphon stenosis.  The anterior and middle cerebral vessels are patent without correctable proximal stenosis, aneurysm or vascular malformation.  More distal branch vessels show atherosclerotic irregularity.  There is a large posterior communicating artery on the right.  Both  vertebral arteries are widely patent to the basilar.  No basilar stenosis.  Flow is seen in both superior cerebellar arteries.  The left posterior cerebral artery appears normal. There is no flow in the right posterior cerebral artery beyond 1 cm.  This could be due to thrombotic occlusion or embolic occlusion.  This is consistent with the region of infarction.  IMPRESSION: Distal vessel atherosclerotic irregularity the branch vessels in the anterior circulation but no major vessel occlusion or correctable proximal stenosis.  No flow seen in the right posterior cerebral artery beyond 1 cm from its origin.  This is consistent with the region of infarction. This occlusion could be due to thrombotic occlusion or embolic occlusion. Thomasenia Sales, M.D.   12/02/11 MRI HEAD w/o contrast Findings: Acute infarct in the right posterior cerebral artery  territory. This involves the right occipital lobe, right medial temporal lobe, and right thalamus.  Generalized atrophy. Chronic ischemic changes  in the white matter. Scattered areas of chronic micro hemorrhage are present in the brain. These are most likely related to amyloid or chronic  hypertension. Chronic hemorrhage is present in the left cerebellum, central pons, and thalami bilaterally. Small areas of micro hemorrhage in the right frontal and right parietal lobe also  present. Negative for mass lesion. Chronic sinusitis. IMPRESSION: Acute right PCA infarct.  Atrophy and chronic ischemic changes. Camelia Phenes, M.D.  12/02/11 HEAD CT: Findings: There is central and cortical atrophy. Periventricular white matter changes are consistent with small vessel disease. There is no evidence for hemorrhage, mass lesion, or acute infarction. There is low attenuation within the region of the right occipital lobe, favored to represent subacute or chronic infarct. Bone windows show no calvarial fracture. There is mucoperiosteal thickening of the ethmoid, frontal, and maxillary sinuses. There  is atherosclerotic calcification of the internal carotid arteries. IMPRESSION: 1. Atrophy and small vessel disease. 2. Subacute or chronic infarct involving the right occipital lobe. No associated hemorrhage: Patterson Hammersmith, M.D  12/04/11 2D ECHO: - Left ventricle: The cavity size was normal. Systolic function was normal. The estimated ejection fraction was in the range of 50% to 55%. Probable akinesis of the basalinferior myocardium. Left ventricular diastolic function parameters were normal. - Aortic valve: Trivial regurgitation. - Mitral valve: Moderate regurgitation directed eccentrically and toward the free wall. - Left atrium: The atrium was mildly dilated. - Pulmonary arteries: PA peak pressure: 38mm Hg (S). Impressions:The right ventricular systolic pressure was increased consistent with mild pulmonary hypertension.  12/03/11 Carotid Ultrasound: No significant extracranial carotid artery stenosis demonstrated. Vertebrals are patent with antegrade flow.  Duplex imaging of the brachial artery demonstrates triphasic waveforms bilaterally.  Therapies: PT: Significant gait ataxia worsened by head turns. All vestibular stimulation provokes blurred vision Rehab consult (consider INPT REHAB for post acute PT)  OT:  SLP: Pt. would benefit from short term ST to teach speech stratetgies to improve speech intelligibility in conversation. Home health SLP   Assessment Mr. Shayon Trompeter is a 71 y.o. male with  1. 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  Possible proximal disease of the left vertebral artery, possible embolus to the right posterior cerebral artery. It appears that the right posterior cerebral artery may get some contribution from the anterior circulation through the posterior communicating artery. Overall, the MR angiogram appears to be relatively unremarkable proximally.On aspirin 325 mg orally every day for secondary stroke prevention. The patient was not on aspirin PTA.  Stroke risk factors: hypertension and smoking.  LDL 41  2. Erosive Esophagitis-GI bleed PTA .  EGD 12/04/11 Dr. Marina Goodell 3. Hyperglycemia- A1C 6.1 4. Tobacco Abuse 5. HTN- controlled.  Plan: 1.The aspirin has been discontinued secondary to the history of GI bleed. In the future, if an antiplatelet agent can be used, Plavix may be a better selection. Recommend contacting Dr. Marina Goodell to find out when antiplatelet therapy can be initiated. 2. Discharge planning, follow up with Dr. Pearlean Brownie in 2 months. 3. Risk factor modification 4. Tobacco recommend smoking cessation consult. 5. Stroke team will sign off. Call for questions.    LOS: 4 days    12/06/2011  11:10 AM

## 2011-12-06 NOTE — Progress Notes (Signed)
I met with patient at bedside. He states he plans to go home today. Feels he is ready. Dr. Riley Kill feels patient close to his baseline at supervison to min assist level with visual changes since prior to his admission. We do not anticipate he could become modified independent and his girlfriend is hospitalized in [id number]. Patient states there is no one else to provide assist to him at home nor does he feel he needs assist. Patient not a candidate for CIR admit nor do I recommend he go home without  assistance. Recommend SNF at this time, but patient is not in agreement. Please call with any questions. Pager [phone]

## 2011-12-06 NOTE — Progress Notes (Signed)
NF Progress Note  S: Called to pt's bedside b/c pt was demanding to leave.  Pt explains that he never agreed to go to SNF and that he would like to go home. When asked why he would like to leave now, the patient says that he has "business" to attend to.  O: Gen: A&O x 3, actively taking off monitors and dressing in street clothes Psych: appropriate though confrontational, logical, normal thought process/content Neuro: grossly poor balance  A/P: 71 year old gentleman admitted for stroke and GI bleed who was getting set up for discharge to SNF, but who demanded to leave this evening. The patient was alert and oriented x3, and had good thought process and content. He said he was frustrated by still being in the hospital and that he did not want to go to a SNF. As to why he wanted to leave immediately, he said he had "business" to attend to. I asked him if I could help him with his business and he said I could not. I asked if he would stay until morning to arrange appropriate discharge, and he said he would not. He was able to understand and described the risks with leaving AGAINST MEDICAL ADVICE, particularly that he is actively being treated for pneumonia and will not get his continuation of antibiotics and that he has poor balance per PT assessment and that he may fall and suffer further injury. Again, the patient was able to voice these risks and stated that he knows he will be solely accountable for any harm or injury incurred upon leaving. Both the senior resident on call (Dr. Andrey Cota), the RN, and myself attempted to reason with this patient that he could wait until morning at which time he could be discharged safely. Despite our efforts, the patient insisted on leaving this evening. He was taken to the front entrance in a wheelchair where he was transported via taxicab.

## 2011-12-06 NOTE — Progress Notes (Signed)
Resident Co-sign Daily Note: I have seen the patient and reviewed the daily progress note by Vincent Marek MS IV and discussed the care of the patient with them.  See below for documentation of my findings, assessment, and plans.  Subjective: He reports feeling better- wants to go home!   Objective: Vital signs in last 24 hours: Filed Vitals:   12/06/11 0600 12/06/11 0954 12/06/11 1413 12/06/11 1700  BP: 156/88 149/101 164/87 159/91  Pulse: 85 96 89 97  Temp: 97.3 F (36.3 C) 98.3 F (36.8 C) 98.6 F (37 C) 98.4 F (36.9 C)  TempSrc: Oral Oral Oral   Resp: 18 18 18 20   Height:      Weight:      SpO2: 90% 93% 94% 97%   Physical Exam: BP 159/91  Pulse 97  Temp(Src) 98.4 F (36.9 C) (Oral)  Resp 20  Ht 5\' 9"  (1.753 m)  Wt 150 lb 12.7 oz (68.4 kg)  BMI 22.27 kg/m2  SpO2 97%  General Appearance:    Alert, cooperative, no distress, appears stated age  Head:    Normocephalic, without obvious abnormality, atraumatic  Eyes:    PERRL, conjunctiva/corneas clear, EOM's intact, fundi    benign, both eyes       Ears:    Normal TM's and external ear canals, both ears  Nose:   Nares normal, septum midline, mucosa normal, no drainage    or sinus tenderness  Throat:   Lips, mucosa, and tongue normal; teeth and gums normal  Neck:   Supple, symmetrical, trachea midline, no adenopathy;       thyroid:  No enlargement/tenderness/nodules; no carotid   bruit or JVD  Back:     Symmetric, no curvature, ROM normal, no CVA tenderness  Lungs:     Clear to auscultation bilaterally, respirations unlabored  Chest wall:    No tenderness or deformity  Heart:    Regular rate and rhythm, S1 and S2 normal, no murmur, rub   or gallop  Abdomen:     Soft, non-tender, bowel sounds active all four quadrants,    no masses, no organomegaly  Genitalia:    Normal male without lesion, discharge or tenderness  Rectal:    Normal tone, normal prostate, no masses or tenderness;   guaiac negative stool  Extremities:    Extremities normal, atraumatic, no cyanosis or edema  Pulses:   2+ and symmetric all extremities  Skin:   Skin color, texture, turgor normal, no rashes or lesions  Lymph nodes:   Cervical, supraclavicular, and axillary nodes normal  Neurologic:  left homonymous hemianopia, Normal strength, sensation and reflexes      throughout   Lab Results: Reviewed and documented in Electronic Record Micro Results: Reviewed and documented in Electronic Record Studies/Results: Reviewed and documented in Electronic Record Medications: I have reviewed the patient's current medications. Scheduled Meds:   . ferrous sulfate  325 mg Oral BID WC  . levofloxacin  750 mg Oral Q2000  . pantoprazole  40 mg Oral Q1200  . sodium chloride  3 mL Intravenous Q12H  . vancomycin  750 mg Intravenous Q12H  . DISCONTD: levofloxacin (LEVAQUIN) IV  750 mg Intravenous Q24H   Continuous Infusions:  PRN Meds:.acetaminophen, acetaminophen, chlorpheniramine-HYDROcodone, nicotine, ondansetron (ZOFRAN) IV, ondansetron, senna-docusate, DISCONTD: acetaminophen, DISCONTD: acetaminophen, DISCONTD: ondansetron (ZOFRAN) IV Assessment/Plan:   1. Hospital acquired pneumonia: With his cough, leucocytosis and CXR and development of symptoms within 48 hours of admission , he most likely has HCAP. His cough is  improving and his leucocytosis has resolved. - Continue vanc and levaquin. D/C on levaquin  2. Iron deficiency anemia: Status post EGD on 12/04/11 that showed ulcerative Esophagitis.  - Protonix twice a day x 4 weeks and then daily indefinitely -Would start on iron supplementation  -Counseled on limited NSAID use and cessation of alcohol and smoking.   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 MRI and MRA are consistent with Right PCA infarct. Appreciate neuro inputs! Echo shows basalinferior myocardium and moderate regurgitation. Based on the results, neurology recommends  TEE and outpatient holter monitoring. His tele was reviewed and there were no events consistent with atrial fibrillation. Would defer getting a TEE at this time because we would  not anyways start him on anti-coagulation given the recent episode of GI bleed.  [name] is a strong willed man, who has been not much compliant with his doctor's appointments, so it would not be plausible to start him on anti- coagulants given these circumstances. His case was discussed with Dr. Aundria Rud. - Continue PT/OT. - D/c him on ASA 81 mg  - Will schedule an appointment with Dr. Pearlean Brownie in 2 months.  4. DVT: SCD's.   5. Dispo- PT recommends d/c to SNF. He was insisting to be discharged home but when he came to know that his girlfriend would be going to the [facility]  , he agreed to be discharged to SNF. D/C to SNF when bed is available.      LOS: 4 days   Michelle Vanhise 12/06/2011, 7:27 PMMedical Student Daily Progress Note  Subjective: He was stable overnight without any acute events.  He had some non-productive cough, but his abdominal pain is reduced as he has coughed less than before.  He feels better and asks for a cab ride home.  Per nursing, he was climbing out of bed without calling and was unsteady on his feet.  He was incontinent but patient denies this.  Nursing also states that they were called by a daughter or granddaughter stating that the house he rents has been pad-locked.  His sats have been 89/90 through the night.  He had some abdominal pain associated with cough, but overall feels much better than yesterday.  Objective: Vital signs in last 24 hours: Filed Vitals:   12/05/11 2200 12/06/11 0145 12/06/11 0600 12/06/11 0954  BP: 161/83 163/87 156/88 149/101  Pulse: 95 87 85 96  Temp: 97.9 F (36.6 C) 98.1 F (36.7 C) 97.3 F (36.3 C) 98.3 F (36.8 C)  TempSrc: Oral Oral Oral Oral  Resp: 18 18 18 18   Height:      Weight:      SpO2: 89% 91% 90% 93%   Weight change:   Intake/Output  Summary (Last 24 hours) at 12/06/11 1401 Last data filed at 12/06/11 1226  Gross per 24 hour  Intake    305 ml  Output   1650 ml  Net  -1345 ml   Physical Exam: BP 149/101  Pulse 96  Temp(Src) 98.3 F (36.8 C) (Oral)  Resp 18  Ht 5\' 9"  (1.753 m)  Wt 68.4 kg (150 lb 12.7 oz)  BMI 22.27 kg/m2  SpO2 93% General appearance: alert, cooperative and distracted Head: Normocephalic, without obvious abnormality, atraumatic Eyes: positive findings: sclera clear and visual field defect in the left upper and lower fields. On dual simultaneous visual field exam, patient is unable to see in the left VF.  EOMI intact, pupils  are constricted at 2-32mm. 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 Neurologic: Alert, but not oriented to date (knows season, but not month or day of week), floor. Lab Results: Basic Metabolic Panel:  Lab 12/06/11 0981 12/05/11 0310  NA 135 134*  K 3.4* 3.9  CL 107 104  CO2 19 21  GLUCOSE 120* 97  BUN 8 9  CREATININE 0.82 0.92  CALCIUM 9.2 8.8  MG -- --  PHOS -- --   Liver Function Tests:  Lab 12/02/11 1515  AST 14  ALT 6  ALKPHOS 64  BILITOT 0.3  PROT 7.1  ALBUMIN 3.3*   CBC:  Lab 12/06/11 0545 12/05/11 1455 12/02/11 1515  WBC 10.5 12.1* --  NEUTROABS -- -- 4.1  HGB 9.3* 9.1* --  HCT 31.3* 30.5* --  MCV 70.2* 71.1* --  PLT 328 315 --   Cardiac Enzymes:  Lab 12/03/11 1817 12/03/11 1030 12/03/11 0230  CKTOTAL 96 85 84  CKMB 2.3 2.2 2.2  CKMBINDEX -- -- --  TROPONINI <0.30 <0.30 <0.30   CBG:  Lab 12/06/11 0804 12/05/11 0637 12/03/11 0820  GLUCAP 105* 106* 95   Hemoglobin A1C:  Lab 12/03/11 0229  HGBA1C 6.1*   Fasting Lipid Panel:  Lab 12/03/11 0229  CHOL 95    HDL 39*  LDLCALC 41  TRIG 74  CHOLHDL 2.4  LDLDIRECT --   Thyroid Function Tests:  Lab 12/03/11 0229  TSH 0.605  T4TOTAL --  FREET4 --  T3FREE --  THYROIDAB --   Coagulation:  Lab 12/03/11 0229  LABPROT 14.7  INR 1.13   Anemia Panel:  Lab 12/03/11 0229  VITAMINB12 578  FOLATE >20.0  FERRITIN 4*  TIBC 425  IRON 72  RETICCTPCT 0.9     Urinalysis:  Lab 12/02/11 1739  COLORURINE AMBER*  LABSPEC 1.020  PHURINE 8.0  GLUCOSEU NEGATIVE  HGBUR NEGATIVE  BILIRUBINUR SMALL*  KETONESUR NEGATIVE  PROTEINUR NEGATIVE  UROBILINOGEN 1.0  NITRITE NEGATIVE  LEUKOCYTESUR SMALL*    Micro Results: Recent Results (from the past 240 hour(s))  URINE CULTURE     Status: Normal   Collection Time   12/02/11  5:39 PM      Component Value Range Status Comment   Specimen Description URINE, CLEAN CATCH   Final    Special Requests NONE   Final    Culture  Setup Time [id number]   Final    Colony Count 70,000 COLONIES/ML   Final    Culture     Final    Value: Multiple bacterial morphotypes present, none predominant. Suggest appropriate recollection if clinically indicated.   Report Status 12/03/2011 FINAL   Final   MRSA PCR SCREENING     Status: Normal   Collection Time   12/02/11 10:50 PM      Component Value Range Status Comment   MRSA by PCR NEGATIVE  NEGATIVE  Final   CULTURE, SPUTUM-ASSESSMENT     Status: Normal   Collection Time   12/05/11  9:17 PM      Component Value Range Status Comment   Specimen Description SPUTUM   Final    Special Requests NONE   Final    Sputum evaluation     Final  Value: MICROSCOPIC FINDINGS SUGGEST THAT THIS SPECIMEN IS NOT REPRESENTATIVE OF LOWER RESPIRATORY SECRETIONS. PLEASE RECOLLECT.     CALLED TO RN T.TYLER AT 0111 12/06/11 BY L.PITT   Report Status 12/06/2011 FINAL   Final    Studies/Results: Dg Chest 2 View  12/04/2011  *RADIOLOGY REPORT*  Clinical Data: History of cough and leukocytosis.  Evaluate for pneumonia.  CHEST - 2 VIEW   Comparison: Multiple priors, most recently chest x-ray 07/21/2011.  Findings: When compared to prior examinations, there is new diffuse interstitial prominence and patchy airspace disease, particularly in the right lung.  Blunting of both costophrenic sulci suggests the presence of small bilateral pleural effusions.  Pulmonary vasculature appears mildly engorged.  Heart size is upper limits of normal. The patient is rotated to the right on today's exam, resulting in distortion of the mediastinal contours and reduced diagnostic sensitivity and specificity for mediastinal pathology.  IMPRESSION: 1.  Interval development of widespread interstitial and patchy air space disease which is asymmetric involving the right lung to a greater extent than left.  Findings are concerning for multilobar pneumonia. 2.  Small bilateral pleural effusions. 3.  Mild pulmonary venous congestion without frank pulmonary edema.  Original Report Authenticated By: Florencia Reasons, M.D.   12/04/11 2D ECHO: - Left ventricle: The cavity size was normal. Systolic function was normal. The estimated ejection fraction was in the range of 50% to 55%. Probable akinesis of the basalinferior myocardium. Left ventricular diastolic function parameters were normal. - Aortic valve: Trivial regurgitation. - Mitral valve: Moderate regurgitation directed eccentrically and toward the free wall. - Left atrium: The atrium was mildly dilated. - Pulmonary arteries: PA peak pressure: 38mm Hg (S). Impressions:The right ventricular systolic pressure was increased consistent with mild pulmonary hypertension.  12/03/11 Carotid Ultrasound: No significant extracranial carotid artery stenosis demonstrated. Vertebrals are patent with antegrade flow. Duplex imaging of the brachial artery demonstrates triphasic waveforms bilaterally.  Therapies:  PT: Significant gait ataxia worsened by head turns. All vestibular stimulation provokes blurred vision Rehab consult  (consider INPT REHAB for post acute PT)  OT: Would benefit from inpatient rehab SLP: Pt. would benefit from short term ST to teach speech stratetgies to improve speech intelligibility in conversation. Home health SLP  Medications: I have reviewed the patient's current medications. Scheduled Meds:    . ferrous sulfate  325 mg Oral BID WC  . levofloxacin  750 mg Oral Q2000  . pantoprazole  40 mg Oral Q1200  . sodium chloride  3 mL Intravenous Q12H  . vancomycin  750 mg Intravenous Q12H  . DISCONTD: levofloxacin (LEVAQUIN) IV  750 mg Intravenous Q24H   Continuous Infusions:  PRN Meds:.acetaminophen, acetaminophen, chlorpheniramine-HYDROcodone, nicotine, ondansetron (ZOFRAN) IV, ondansetron, senna-docusate, DISCONTD: acetaminophen, DISCONTD: acetaminophen, DISCONTD: ondansetron (ZOFRAN) IV Assessment/Plan:  1. Acute Right PCA infarct - Pt's symptoms and MRI are consistent with acute right PCA infarct, and neurology has signed off today. Neuro exam unchanged since admission, with significant left sided visual field defect c/w L homonymous hemianopia.  -Will discharge with 81mg  ASA which was recommended by Neuro.  Not acutely bleeding.   -Requested Consult from CIR, they recommend dispo to SNF, not eligible for Inpt Rehab -Pt will have outpatient followup in 1 week with [facility] internal medicine teaching service and 2 months after discharge with Dr. Pearlean Brownie.  2. Acute on Chronic microcytic anemia - Patient has a history of microcytic anemia (MCVs in upper 50s), gastritis and mild erosions seen on prior EGD.  EGD on 3/4 showed severe  ulcerative gastritis and a 4cm hiatal hernia. Recent h/o of melena but FOBT negative x 2.   -Iron 72 (normal 42-135), MCV 69, ferritin 4 consistent with microcytic anemia due to iron deficiency anemia.  -Transfused 3 units to date.  Will follow with daily CBC.  Started on PO Iron supplementation BID. -IV protonix BID for 1 month, followed by QD indefinitely.  3.  Multilobar Pneumonia: CXR findings above, c/w with multilobar PNA and some mild pulmonary congestion.  Pt remains afebfile, but otherwise symptoms are subjectively improved from yesterday, per patient.  WBC improved to 10.5 today.  Diuresed well with Lasix.  -Patient is started on Levaquin and Vancomycin on 3/4. We will discharge on PO Levaquin  -Sputum equivocal.  4.Hypertension: BP stable. Continue to monitor on tele.  5. Hypokalemia: Resolved after 4/4 runs of potassium, K is 3.4 today, unclear as to why patient has significant losses of potassium.  Pt has chronically had low potassium since 2010 from 2.8-3.4.  6. Tobacco and Alcohol abuse: Counseled him on cessation.  7. DVT: SCDs  8. Diet/Fluids - Regular  9. Dispo:  Patient floor status and will be discharged home after hearing from Discharge planning as patient has no desire at all to go to SNF and may not be able to get into home that is padlocked.  His "gf of 30 years" is inpatient in 6712. We will discuss with Discharge planning for further recommendations and may be d/c today pending.   LOS: 4 days   This is a Psychologist, occupational Note.  The care of the patient was discussed with Dr. Dorthula Rue and the assessment and plan formulated with their assistance.  Please see their attached note for official documentation of the daily encounter.  Allena Katz, Mehul Chimanlal 12/06/2011, 2:01 PM

## 2011-12-06 NOTE — ED Provider Notes (Signed)
Medical screening examination/treatment/procedure(s) were conducted as a shared visit with non-physician practitioner(s) and myself.  I personally evaluated the patient during the encounter.  Patient with dizziness and difficulty with ambulation. MRI is consistent with a posterior circulation stroke. Patient is also severely anemic. Urology was consult. Blood transfusions. Admission for further evaluation and treatment.  CRITICAL CARE Performed by: Raeford Razor   Total critical care time: 35 minutes  Critical care time was exclusive of separately billable procedures and treating other patients.  Critical care was necessary to treat or prevent imminent or life-threatening deterioration.  Critical care was time spent personally by me on the following activities: development of treatment plan with patient and/or surrogate as well as nursing, discussions with consultants, evaluation of patient's response to treatment, examination of patient, obtaining history from patient or surrogate, ordering and performing treatments and interventions, ordering and review of laboratory studies, ordering and review of radiographic studies, pulse oximetry and re-evaluation of patient's condition.     Ct Head Wo Contrast  12/02/2011  *RADIOLOGY REPORT*  Clinical Data: Dizziness.  Fell.  Weakness.  CT HEAD WITHOUT CONTRAST  Technique:  Contiguous axial images were obtained from the base of the skull through the vertex without contrast.  Comparison: CT head 04/30/2008  Findings: There is central and cortical atrophy.  Periventricular white matter changes are consistent with small vessel disease. There is no evidence for hemorrhage, mass lesion, or acute infarction. There is low attenuation within the region of the right occipital lobe, favored to represent subacute or chronic infarct.  Bone windows show no calvarial fracture.  There is mucoperiosteal thickening of the ethmoid, frontal, and maxillary sinuses.  There is  atherosclerotic calcification of the internal carotid arteries.  IMPRESSION:  1.  Atrophy and small vessel disease. 2.  Subacute or chronic infarct involving the right occipital lobe. No associated hemorrhage.  Critical test results telephoned to Ebbie Ridge at the time of interpretation on date 12/02/2011 at time 3:51 p.m.  Original Report Authenticated By: Patterson Hammersmith, M.D.   Mr Brain Wo Contrast  12/02/2011  *RADIOLOGY REPORT*  Clinical Data: Weakness.  Gait disturbance.  MRI HEAD WITHOUT CONTRAST  Technique:  Multiplanar, multiecho pulse sequences of the brain and surrounding structures were obtained according to standard protocol without intravenous contrast.  Comparison: CT 12/02/2011  Findings: Acute infarct in the right posterior cerebral artery territory.  This involves the right occipital lobe, right medial temporal lobe, and right thalamus.  Generalized atrophy.  Chronic ischemic changes in the white matter. Scattered areas of chronic micro hemorrhage are present in the brain.  These are most likely related to amyloid or chronic hypertension.  Chronic hemorrhage is present in the left cerebellum, central pons, and thalami bilaterally.  Small areas of micro hemorrhage in the right frontal and right parietal lobe also present.  Negative for mass lesion.  Chronic sinusitis.  IMPRESSION: Acute right PCA infarct.  Atrophy and chronic ischemic changes.  Original Report Authenticated By: Camelia Phenes, M.D.   Raeford Razor, MD 12/06/11 [phone]

## 2011-12-06 NOTE — Progress Notes (Signed)
[name] is demanding to go home.  He has ongoing, probably permanent problems that raise serious safety concerns but he has capacity to make his choices.  He has fallen or nearly fallen several times.  He should be prophylaxed with 81mg  ASA for stroke prevention but he also has oozing esophagitis and drinks alcohol.  He insists he will stop drinking but it is possible to be skeptical of this.  Pneumonitis has improved.  Hgb is 9 post transfusion.  Rehab does not think he could benefit from in-patient stay.  We will send him home.  Will try to get his permission to discuss issues with his daughters.

## 2011-12-06 NOTE — Progress Notes (Signed)
Patient condom cath became disconnected.  Necessitated changing linen and bathing patient.  Additional components to integumentary assessment are small scar across left upper chest and long approximated healed incision under left arm. Scar under arm extends from axilla almost to elbow.

## 2011-12-06 NOTE — Progress Notes (Signed)
Physical Therapy Treatment Patient Details Name: [name] MRN: [id number] DOB: [birth year] Today's Date: 12/06/2011  PT Assessment/Plan  PT - Assessment/Plan Comments on Treatment Session: 71 y.o. male black presenting in the ER with vertigo/dizziness, found to have right PCA infarct. Making steady progress with ambulation - responded well to compensation technique of visual targets with ambulation to decrease dizziness and imbalance with gait. Re-inforced x1 gaze stabilization exercises however it appears pt has difficulty remembering to perform these and perform them correctly. Continues to recommend rehab options for followup.  PT Plan: Discharge plan needs to be updated PT Frequency: Min 4X/week Follow Up Recommendations: Supervision/Assistance - 24 hour;Supervision for mobility/OOB;Skilled nursing facility Equipment Recommended: Defer to next venue PT Goals  Acute Rehab PT Goals Pt will go Sit to Stand: with modified independence;with upper extremity assist;Other (comment) (no LOB or vestibular symptoms) PT Goal: Sit to Stand - Progress: Progressing toward goal Pt will go Stand to Sit: with modified independence;with upper extremity assist (asymptomatic) PT Goal: Stand to Sit - Progress: Progressing toward goal Pt will Stand: with supervision;with no upper extremity support;Other (comment) (to perform x1 viewing exercises x 20 reps) PT Goal: Stand - Progress: Progressing toward goal Pt will Ambulate: >150 feet;with supervision;with least restrictive assistive device;Other (comment) (performing horizontal head turns and no LOB) PT Goal: Ambulate - Progress: Progressing toward goal Pt will Perform Home Exercise Program: with supervision, verbal cues required/provided PT Goal: Perform Home Exercise Program - Progress: Progressing toward goal  PT Treatment Precautions/Restrictions  Precautions Precautions: Fall Precaution Comments: vertigo; Lt hemianopsia Required Braces or Orthoses:  No Restrictions Weight Bearing Restrictions: No Other Position/Activity Restrictions: up with assist only Mobility (including Balance) Bed Mobility Bed Mobility: Yes Rolling Right: 6: Modified independent (Device/Increase time) Right Sidelying to Sit: HOB flat;5: Supervision Right Sidelying to Sit Details (indicate cue type and reason): Verbal cues for trying to focus on target with torso and head movements, pt not following.  Sit to Supine: 5: Supervision Sit to Supine - Details (indicate cue type and reason): Supervision for safety Transfers Transfers: Yes Sit to Stand: From bed;4: Min assist Sit to Stand Details (indicate cue type and reason): Pt with initial loss of balance to Lt. once in standing. Pt able to advance to min-guard assist Stand to Sit: Other (comment) (min-guard assist) Stand to Sit Details: Pt with slow descent to maintain balance. Ambulation/Gait Ambulation/Gait: Yes Ambulation/Gait Assistance: 3: Mod assist;4: Min assist Ambulation/Gait Assistance Details (indicate cue type and reason): moderate assist for 2 major losses of balance, otherwise pt min assist throughout secondary to unsteadiness and increased sway. Repeated cues to utilize target visualization to decrease dizziness and imbalance with gait. Pt with decreased concentration initially, decreased ability to maintain visual target however progressed and found technique helpful.  Ambulation Distance (Feet): 350 Feet Assistive device: None Gait Pattern: Ataxic;Step-through pattern;Decreased stride length (Varying gait width, length, and speed) Stairs: No Wheelchair Mobility Wheelchair Mobility: No    Exercise  Other Exercises Other Exercises: Pt re-instructed in x 1 exercises for gaze stability/decreasing vertigo as pt not able to remember exercise. Pt continues with VERY slow head movements in order to keep eyes on target while maintaining focus. Pt was instructed again to do 4-5 times per day for 30-60  seconds and to work on increasing speed. Appears pt has not yet been compliant. End of Session PT - End of Session Equipment Utilized During Treatment: Gait belt Activity Tolerance: Patient tolerated treatment well (SaO2 >95 % on room air) Patient left: with  call bell in reach;in bed (per pt request) Nurse Communication: Mobility status for ambulation General Behavior During Session: [facility] for tasks performed Cognition: Impaired (decr safety judgement/awareness of deficits)  Wilhemina Bonito 12/06/2011, 5:53 PM Sherie Don) Carleene Mains PT, DPT Acute Rehabilitation [phone]

## 2011-12-06 NOTE — Progress Notes (Signed)
Clinical Social Work-CSW met with pt at bedside to discuss disposition-full assessment and FL2 in shadow chart-CSW d/c pt girlfriend today to [facility] and pt would like to d/c to same SNF-CSW contacted SNF and they will accept pt on 3/7 when bed/room becomes available-CSW will follow to facilitate d/c. Jodean Lima, [phone]

## 2011-12-06 NOTE — Plan of Care (Signed)
Problem: Phase II Progression Outcomes Goal: Lab tests from Phase I complete Outcome: Progressing Patient continues to have routine CBC blood draws every 12 hours. Note left for physician on chart regarding continued need for frequent blood draws.

## 2011-12-07 NOTE — Progress Notes (Signed)
Pt observed to be very agitated,refuses to stay in bed at 2030,demanding to go home,Dr Hughes Better and Dr Alcus Dad Renolds(on call) paged and notified,they both came up to speak with patient but he insisted on going home,pt oriented to person,time, place and situation but still unsteady.An AMA form explained and given to patient to sign which he did sign at 2102,security was notified of his intentions to leave,his stored belongings were brought to him by security,pt taken down in a wheelchair at about 2130. Obasogie-Asidi, Shaketta Rill Efe

## 2011-12-18 ENCOUNTER — Encounter: Payer: Self-pay | Admitting: Internal Medicine

## 2011-12-18 ENCOUNTER — Encounter: Payer: Medicare Other | Admitting: Internal Medicine

## 2011-12-18 ENCOUNTER — Ambulatory Visit (HOSPITAL_COMMUNITY)
Admission: RE | Admit: 2011-12-18 | Discharge: 2011-12-18 | Disposition: A | Discharge status: Home / Self Care | Payer: Medicare Other | Source: Ambulatory Visit | Attending: Internal Medicine | Admitting: Internal Medicine

## 2011-12-18 ENCOUNTER — Ambulatory Visit (INDEPENDENT_AMBULATORY_CARE_PROVIDER_SITE_OTHER): Payer: Medicare Other | Admitting: Internal Medicine

## 2011-12-18 VITALS — BP 155/95 | HR 85 | Temp 97.1°F | Ht 69.0 in | Wt 154.0 lb

## 2011-12-18 DIAGNOSIS — J189 Pneumonia, unspecified organism: Secondary | ICD-10-CM

## 2011-12-18 DIAGNOSIS — R1314 Dysphagia, pharyngoesophageal phase: Secondary | ICD-10-CM

## 2011-12-18 DIAGNOSIS — Z8701 Personal history of pneumonia (recurrent): Secondary | ICD-10-CM | POA: Insufficient documentation

## 2011-12-18 DIAGNOSIS — D649 Anemia, unspecified: Secondary | ICD-10-CM

## 2011-12-18 DIAGNOSIS — I639 Cerebral infarction, unspecified: Secondary | ICD-10-CM

## 2011-12-18 DIAGNOSIS — R05 Cough: Secondary | ICD-10-CM | POA: Insufficient documentation

## 2011-12-18 DIAGNOSIS — R059 Cough, unspecified: Secondary | ICD-10-CM | POA: Insufficient documentation

## 2011-12-18 DIAGNOSIS — I10 Essential (primary) hypertension: Secondary | ICD-10-CM

## 2011-12-18 DIAGNOSIS — K209 Esophagitis, unspecified without bleeding: Secondary | ICD-10-CM

## 2011-12-18 DIAGNOSIS — R350 Frequency of micturition: Secondary | ICD-10-CM

## 2011-12-18 DIAGNOSIS — D509 Iron deficiency anemia, unspecified: Secondary | ICD-10-CM

## 2011-12-18 DIAGNOSIS — I6350 Cerebral infarction due to unspecified occlusion or stenosis of unspecified cerebral artery: Secondary | ICD-10-CM

## 2011-12-18 DIAGNOSIS — IMO0001 Reserved for inherently not codable concepts without codable children: Secondary | ICD-10-CM

## 2011-12-18 DIAGNOSIS — R1310 Dysphagia, unspecified: Secondary | ICD-10-CM

## 2011-12-18 MED ORDER — HYDROCHLOROTHIAZIDE 25 MG PO TABS: 25.0000 mg | ORAL_TABLET | Freq: Every day | ORAL | Status: DC

## 2011-12-18 NOTE — Assessment & Plan Note (Addendum)
He developed hospital-acquired pneumonia during his stay that  was treated with 3 days of vancomycin and  Levaquin. The plan was to continue Levaquin for hospital-acquired pneumonia for 7 days but the patient left AGAINST MEDICAL ADVICE and  no medicines could be given. As of today he reports having some cough but denies any fevers or shortness of breath. -Obtain a new chest x-ray. Would followup on the results to see if he still has any residual infiltrate that requires any treatment.  Update-CXR reviewed- it shows changes consistent with COPD and interval resolution of pneumonia.              Would not start him on antibiotics.

## 2011-12-18 NOTE — Patient Instructions (Addendum)
Please schedule a follow up appointment in  2 months or sooner if needed. Please schedule a follow up appointment with Dr. Pearlean Brownie in 1 month at [facility]. . Please bring your medication bottles with your next appointment. Please take your medicines as prescribed. I will call you with your lab results if anything will be abnormal.

## 2011-12-18 NOTE — Assessment & Plan Note (Signed)
He complained of frequency of urination associated with questioning of hesitancy. On exam I couldn't feel prostrate enlargement and there were no signs of prostatitis or epididymitis. -Check UA. -If symptoms persist with the following appointment, we may consider starting him on Flomax.

## 2011-12-18 NOTE — Assessment & Plan Note (Signed)
He denies any dysphagia, hematemesis or melena. -Continue Protonix twice daily for now. With a subsequent visit would change to frequency to once daily based on GI recommendations. -Check CBC today to make sure his H. and H. is stable

## 2011-12-18 NOTE — Assessment & Plan Note (Addendum)
He presented with hemoglobin of 6 in the setting of worsening of his chronic anemia with acute GI bleed and was transfused with 2 units during his hospital stay. His H&H was stable at the time of discharge. -Check CBC. -Continue ferrous sulphate.
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
## 2011-12-18 NOTE — Assessment & Plan Note (Signed)
On exam he continues to have left homonymous hemianopsia from his right PCA infarct. He still complains of blurry vision associated with difficulty reading from his left eye. He has an appointment with Dr. Nile Riggs ( ophthalmology). Given his age and blurry vision glaucoma and cataract are difficult to rule out. - He was encouraged not to miss his appointment

## 2011-12-18 NOTE — Assessment & Plan Note (Signed)
Lab Results  Component Value Date   NA 135 12/06/2011   K 3.4* 12/06/2011   CL 107 12/06/2011   CO2 19 12/06/2011   BUN 8 12/06/2011   CREATININE 0.82 12/06/2011    BP Readings from Last 3 Encounters:  12/18/11 155/95  12/06/11 159/91  12/06/11 159/91    Assessment: Hypertension control:  mildly elevated  Progress toward goals:  deteriorated Barriers to meeting goals:  no barriers identified  Plan: Hypertension treatment:  Increase the dose of her HCTZ from 12.5 to 25 mg daily.

## 2011-12-19 ENCOUNTER — Telehealth: Payer: Self-pay | Admitting: Internal Medicine

## 2011-12-19 LAB — BASIC METABOLIC PANEL WITH GFR
CO2: 24 mEq/L (ref 19–32)
Calcium: 9.5 mg/dL (ref 8.4–10.5)
Creat: 0.88 mg/dL (ref 0.50–1.35)
GFR, Est African American: >89 mL/min
Sodium: 136 mEq/L (ref 135–145)

## 2011-12-19 LAB — URINALYSIS, ROUTINE W REFLEX MICROSCOPIC
Bilirubin Urine: NEGATIVE
Glucose, UA: NEGATIVE mg/dL
Hgb urine dipstick: NEGATIVE
Leukocytes, UA: NEGATIVE
Protein, ur: NEGATIVE mg/dL
Urobilinogen, UA: 1 mg/dL (ref 0.0–1.0)

## 2011-12-19 LAB — CBC WITH DIFFERENTIAL/PLATELET
Basophils Relative: 0 % (ref 0–1)
Eosinophils Absolute: 0.6 10*3/uL (ref 0.0–0.7)
Eosinophils Relative: 5 % (ref 0–5)
HCT: 35.5 % — ABNORMAL LOW (ref 39.0–52.0)
MCH: 21.5 pg — ABNORMAL LOW (ref 26.0–34.0)
MCHC: 27.9 g/dL — ABNORMAL LOW (ref 30.0–36.0)
MCV: 77.2 fL — ABNORMAL LOW (ref 78.0–100.0)
Neutrophils Relative %: 65 % (ref 43–77)
Platelets: 576 10*3/uL — ABNORMAL HIGH (ref 150–400)

## 2011-12-20 NOTE — Telephone Encounter (Signed)
Created in error

## 2012-02-19 ENCOUNTER — Encounter: Payer: Medicare Other | Admitting: Internal Medicine

## 2012-03-02 ENCOUNTER — Emergency Department (HOSPITAL_COMMUNITY): Payer: Medicare Other

## 2012-03-02 ENCOUNTER — Encounter (HOSPITAL_COMMUNITY): Payer: Self-pay | Admitting: Emergency Medicine

## 2012-03-02 ENCOUNTER — Emergency Department (HOSPITAL_COMMUNITY)
Admission: EM | Admit: 2012-03-02 | Discharge: 2012-03-02 | Disposition: A | Discharge status: Home / Self Care | Payer: Medicare Other | Attending: Emergency Medicine | Admitting: Emergency Medicine

## 2012-03-02 DIAGNOSIS — S0180XA Unspecified open wound of other part of head, initial encounter: Secondary | ICD-10-CM | POA: Insufficient documentation

## 2012-03-02 DIAGNOSIS — S0990XA Unspecified injury of head, initial encounter: Secondary | ICD-10-CM | POA: Insufficient documentation

## 2012-03-02 DIAGNOSIS — S0001XA Abrasion of scalp, initial encounter: Secondary | ICD-10-CM

## 2012-03-02 DIAGNOSIS — IMO0002 Reserved for concepts with insufficient information to code with codable children: Secondary | ICD-10-CM | POA: Insufficient documentation

## 2012-03-02 DIAGNOSIS — I252 Old myocardial infarction: Secondary | ICD-10-CM | POA: Insufficient documentation

## 2012-03-02 DIAGNOSIS — I10 Essential (primary) hypertension: Secondary | ICD-10-CM | POA: Insufficient documentation

## 2012-03-02 DIAGNOSIS — S0181XA Laceration without foreign body of other part of head, initial encounter: Secondary | ICD-10-CM

## 2012-03-02 MED ORDER — SODIUM CHLORIDE 0.9 % IV SOLN
Freq: Once | INTRAVENOUS | Status: AC
  Administered 2012-03-02: 19:00:00 via INTRAVENOUS

## 2012-03-02 MED ORDER — TETANUS-DIPHTH-ACELL PERTUSSIS 5-2.5-18.5 LF-MCG/0.5 IM SUSP
0.5000 mL | Freq: Once | INTRAMUSCULAR | Status: AC
  Administered 2012-03-02: 0.5 mL via INTRAMUSCULAR
  Filled 2012-03-02: qty 0.5

## 2012-03-02 MED ORDER — HYDROCODONE-ACETAMINOPHEN 5-325 MG PO TABS
1.0000 | ORAL_TABLET | Freq: Once | ORAL | Status: AC
  Administered 2012-03-02: 1 via ORAL
  Filled 2012-03-02: qty 1

## 2012-03-02 MED ORDER — HYDROCODONE-ACETAMINOPHEN 5-325 MG PO TABS: 1.0000 | ORAL_TABLET | ORAL | Status: AC | PRN
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
## 2012-03-02 NOTE — ED Notes (Signed)
Pt wounds cleaned with NS.
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
## 2012-03-02 NOTE — Discharge Instructions (Signed)
Abrasions Abrasions are skin scrapes. Their treatment depends on how large and deep the abrasion is. Abrasions do not extend through all layers of the skin. A cut or lesion through all skin layers is called a laceration. HOME CARE INSTRUCTIONS   If you were given a dressing, change it at least once a day or as instructed by your caregiver. If the bandage sticks, soak it off with a solution of water or hydrogen peroxide.   Twice a day, wash the area with soap and water to remove all the cream/ointment. You may do this in a sink, under a tub faucet, or in a shower. Rinse off the soap and pat dry with a clean towel. Look for signs of infection (see below).   Reapply cream/ointment according to your caregiver's instruction. This will help prevent infection and keep the bandage from sticking. Telfa or gauze over the wound and under the dressing or wrap will also help keep the bandage from sticking.   If the bandage becomes wet, dirty, or develops a foul smell, change it as soon as possible.   Only take over-the-counter or prescription medicines for pain, discomfort, or fever as directed by your caregiver.  SEEK IMMEDIATE MEDICAL CARE IF:   Increasing pain in the wound.   Signs of infection develop: redness, swelling, surrounding area is tender to touch, or pus coming from the wound.   You have a fever.   Any foul smell coming from the wound or dressing.  Most skin wounds heal within ten days. Facial wounds heal faster. However, an infection may occur despite proper treatment. You should have the wound checked for signs of infection within 24 to 48 hours or sooner if problems arise. If you were not given a wound-check appointment, look closely at the wound yourself on the second day for early signs of infection listed above. MAKE SURE YOU:   Understand these instructions.   Will watch your condition.   Will get help right away if you are not doing well or get worse.  Document Released:  06/28/2005 Document Revised: 09/07/2011 Document Reviewed: 08/22/2011 [employer] Patient Information 2012 [employer]. 

## 2012-03-02 NOTE — ED Notes (Signed)
Pt could not find his key that he normally hangs around his neck.  The room was searched and it was not found.  Pt told to ask [employer].
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
## 2012-07-06 ENCOUNTER — Emergency Department (HOSPITAL_COMMUNITY): Payer: Medicare Other

## 2012-07-06 ENCOUNTER — Encounter (HOSPITAL_COMMUNITY): Payer: Self-pay | Admitting: *Deleted

## 2012-07-06 ENCOUNTER — Emergency Department (HOSPITAL_COMMUNITY)
Admission: EM | Admit: 2012-07-06 | Discharge: 2012-07-06 | Disposition: A | Discharge status: Home / Self Care | Payer: Medicare Other | Attending: Emergency Medicine | Admitting: Emergency Medicine

## 2012-07-06 DIAGNOSIS — S0003XA Contusion of scalp, initial encounter: Secondary | ICD-10-CM | POA: Insufficient documentation

## 2012-07-06 DIAGNOSIS — S0083XA Contusion of other part of head, initial encounter: Secondary | ICD-10-CM | POA: Insufficient documentation

## 2012-07-06 DIAGNOSIS — G319 Degenerative disease of nervous system, unspecified: Secondary | ICD-10-CM | POA: Insufficient documentation

## 2012-07-06 DIAGNOSIS — I252 Old myocardial infarction: Secondary | ICD-10-CM | POA: Insufficient documentation

## 2012-07-06 DIAGNOSIS — I10 Essential (primary) hypertension: Secondary | ICD-10-CM | POA: Insufficient documentation

## 2012-07-06 DIAGNOSIS — F1010 Alcohol abuse, uncomplicated: Secondary | ICD-10-CM | POA: Insufficient documentation

## 2012-07-06 DIAGNOSIS — T148XXA Other injury of unspecified body region, initial encounter: Secondary | ICD-10-CM

## 2012-07-06 MED ORDER — NAPROXEN 500 MG PO TABS: 500.0000 mg | ORAL_TABLET | Freq: Two times a day (BID) | ORAL | Status: DC
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
## 2012-09-02 ENCOUNTER — Emergency Department (INDEPENDENT_AMBULATORY_CARE_PROVIDER_SITE_OTHER)
Admission: EM | Admit: 2012-09-02 | Discharge: 2012-09-02 | Disposition: A | Discharge status: Other Acute Inpatient Hospital | Payer: Medicare Other | Source: Home / Self Care

## 2012-09-02 ENCOUNTER — Observation Stay (HOSPITAL_COMMUNITY)
Admission: EM | Admit: 2012-09-02 | Discharge: 2012-09-03 | Disposition: A | Discharge status: Home / Self Care | Payer: Medicare Other | Attending: Internal Medicine | Admitting: Internal Medicine

## 2012-09-02 ENCOUNTER — Encounter (HOSPITAL_COMMUNITY): Payer: Self-pay | Admitting: *Deleted

## 2012-09-02 ENCOUNTER — Observation Stay (HOSPITAL_COMMUNITY): Payer: Medicare Other

## 2012-09-02 ENCOUNTER — Emergency Department (HOSPITAL_COMMUNITY): Payer: Medicare Other

## 2012-09-02 ENCOUNTER — Encounter (HOSPITAL_COMMUNITY): Payer: Self-pay

## 2012-09-02 DIAGNOSIS — I440 Atrioventricular block, first degree: Secondary | ICD-10-CM

## 2012-09-02 DIAGNOSIS — I498 Other specified cardiac arrhythmias: Secondary | ICD-10-CM | POA: Insufficient documentation

## 2012-09-02 DIAGNOSIS — I517 Cardiomegaly: Secondary | ICD-10-CM

## 2012-09-02 DIAGNOSIS — Z9114 Patient's other noncompliance with medication regimen: Secondary | ICD-10-CM

## 2012-09-02 DIAGNOSIS — K859 Acute pancreatitis without necrosis or infection, unspecified: Secondary | ICD-10-CM | POA: Diagnosis present

## 2012-09-02 DIAGNOSIS — F1010 Alcohol abuse, uncomplicated: Secondary | ICD-10-CM | POA: Insufficient documentation

## 2012-09-02 DIAGNOSIS — R9431 Abnormal electrocardiogram [ECG] [EKG]: Secondary | ICD-10-CM

## 2012-09-02 DIAGNOSIS — Z91199 Patient's noncompliance with other medical treatment and regimen due to unspecified reason: Secondary | ICD-10-CM | POA: Insufficient documentation

## 2012-09-02 DIAGNOSIS — R1110 Vomiting, unspecified: Secondary | ICD-10-CM | POA: Insufficient documentation

## 2012-09-02 DIAGNOSIS — F17200 Nicotine dependence, unspecified, uncomplicated: Secondary | ICD-10-CM

## 2012-09-02 DIAGNOSIS — R079 Chest pain, unspecified: Secondary | ICD-10-CM

## 2012-09-02 DIAGNOSIS — R209 Unspecified disturbances of skin sensation: Secondary | ICD-10-CM | POA: Insufficient documentation

## 2012-09-02 DIAGNOSIS — I2510 Atherosclerotic heart disease of native coronary artery without angina pectoris: Secondary | ICD-10-CM

## 2012-09-02 DIAGNOSIS — Z9119 Patient's noncompliance with other medical treatment and regimen: Secondary | ICD-10-CM | POA: Insufficient documentation

## 2012-09-02 DIAGNOSIS — I10 Essential (primary) hypertension: Secondary | ICD-10-CM | POA: Insufficient documentation

## 2012-09-02 DIAGNOSIS — Z23 Encounter for immunization: Secondary | ICD-10-CM | POA: Insufficient documentation

## 2012-09-02 DIAGNOSIS — I491 Atrial premature depolarization: Secondary | ICD-10-CM

## 2012-09-02 DIAGNOSIS — R001 Bradycardia, unspecified: Secondary | ICD-10-CM | POA: Diagnosis present

## 2012-09-02 DIAGNOSIS — Z9181 History of falling: Secondary | ICD-10-CM | POA: Insufficient documentation

## 2012-09-02 DIAGNOSIS — Z720 Tobacco use: Secondary | ICD-10-CM

## 2012-09-02 DIAGNOSIS — F1910 Other psychoactive substance abuse, uncomplicated: Secondary | ICD-10-CM | POA: Diagnosis present

## 2012-09-02 DIAGNOSIS — Z8673 Personal history of transient ischemic attack (TIA), and cerebral infarction without residual deficits: Secondary | ICD-10-CM | POA: Insufficient documentation

## 2012-09-02 HISTORY — DX: Atrioventricular block, first degree: I44.0

## 2012-09-02 HISTORY — DX: Contracture, unspecified hand: M24.549

## 2012-09-02 HISTORY — DX: Angina pectoris, unspecified: I20.9

## 2012-09-02 HISTORY — DX: Cardiomegaly: I51.7

## 2012-09-02 HISTORY — DX: Pneumonia, unspecified organism: J18.9

## 2012-09-02 HISTORY — DX: Atrial premature depolarization: I49.1

## 2012-09-02 HISTORY — DX: Rheumatoid arthritis, unspecified: M06.9

## 2012-09-02 HISTORY — DX: Atherosclerotic heart disease of native coronary artery without angina pectoris: I25.10

## 2012-09-02 HISTORY — DX: Cerebral infarction, unspecified: I63.9

## 2012-09-02 HISTORY — DX: Acute pancreatitis without necrosis or infection, unspecified: K85.90

## 2012-09-02 LAB — COMPREHENSIVE METABOLIC PANEL
BUN: 9 mg/dL (ref 6–23)
CO2: 27 mEq/L (ref 19–32)
Chloride: 102 mEq/L (ref 96–112)
Creatinine, Ser: 0.72 mg/dL (ref 0.50–1.35)
GFR calc Af Amer: >90 mL/min (ref >90)
GFR calc non Af Amer: >90 mL/min (ref >90)
Total Bilirubin: 0.5 mg/dL (ref 0.3–1.2)

## 2012-09-02 LAB — CBC WITH DIFFERENTIAL/PLATELET
Basophils Absolute: 0 10*3/uL (ref 0.0–0.1)
Basophils Relative: 0 % (ref 0–1)
Eosinophils Relative: 2 % (ref 0–5)
HCT: 41.2 % (ref 39.0–52.0)
MCHC: 33.5 g/dL (ref 30.0–36.0)
MCV: 91.2 fL (ref 78.0–100.0)
Monocytes Absolute: 0.6 10*3/uL (ref 0.1–1.0)
Neutro Abs: 6 10*3/uL (ref 1.7–7.7)
Platelets: 273 10*3/uL (ref 150–400)
RDW: 14.7 % (ref 11.5–15.5)
WBC: 9.1 10*3/uL (ref 4.0–10.5)

## 2012-09-02 LAB — RAPID URINE DRUG SCREEN, HOSP PERFORMED
Benzodiazepines: NOT DETECTED
Cocaine: NOT DETECTED
Opiates: NOT DETECTED

## 2012-09-02 LAB — LIPID PANEL: LDL Cholesterol: 68 mg/dL (ref 0–99)

## 2012-09-02 LAB — LIPASE, BLOOD: Lipase: 118 U/L — ABNORMAL HIGH (ref 11–59)

## 2012-09-02 LAB — POCT I-STAT TROPONIN I: Troponin i, poc: 0.02 ng/mL (ref 0.00–0.08)

## 2012-09-02 LAB — ETHANOL: Alcohol, Ethyl (B): <11 mg/dL (ref 0–11)

## 2012-09-02 MED ORDER — SODIUM CHLORIDE 0.9 % IV BOLUS (SEPSIS)
1000.0000 mL | Freq: Once | INTRAVENOUS | Status: AC
  Administered 2012-09-02: 1000 mL via INTRAVENOUS

## 2012-09-02 MED ORDER — ONDANSETRON HCL 4 MG PO TABS: 4.0000 mg | ORAL_TABLET | Freq: Four times a day (QID) | ORAL | Status: DC | PRN

## 2012-09-02 MED ORDER — NICOTINE 14 MG/24HR TD PT24
14.0000 mg | MEDICATED_PATCH | Freq: Every day | TRANSDERMAL | Status: DC
  Administered 2012-09-02 – 2012-09-03 (×2): 14 mg via TRANSDERMAL
  Filled 2012-09-02 (×2): qty 1

## 2012-09-02 MED ORDER — SODIUM CHLORIDE 0.9 % IJ SOLN: 3.0000 mL | Freq: Two times a day (BID) | INTRAMUSCULAR | Status: DC

## 2012-09-02 MED ORDER — ASPIRIN 81 MG PO TABS: 81.0000 mg | ORAL_TABLET | Freq: Every day | ORAL | Status: DC

## 2012-09-02 MED ORDER — NITROGLYCERIN 0.4 MG SL SUBL: 0.4000 mg | SUBLINGUAL_TABLET | SUBLINGUAL | Status: DC | PRN

## 2012-09-02 MED ORDER — MORPHINE SULFATE 2 MG/ML IJ SOLN: 0.5000 mg | INTRAMUSCULAR | Status: DC | PRN

## 2012-09-02 MED ORDER — SODIUM CHLORIDE 0.9 % IV SOLN
Freq: Once | INTRAVENOUS | Status: AC
  Administered 2012-09-02: 12:00:00 via INTRAVENOUS

## 2012-09-02 MED ORDER — PANTOPRAZOLE SODIUM 40 MG IV SOLR
40.0000 mg | INTRAVENOUS | Status: DC
  Administered 2012-09-02: 40 mg via INTRAVENOUS
  Filled 2012-09-02 (×2): qty 40

## 2012-09-02 MED ORDER — GI COCKTAIL ~~LOC~~
30.0000 mL | Freq: Once | ORAL | Status: AC
  Administered 2012-09-02: 30 mL via ORAL
  Filled 2012-09-02: qty 30

## 2012-09-02 MED ORDER — ASPIRIN 81 MG PO CHEW
324.0000 mg | CHEWABLE_TABLET | Freq: Once | ORAL | Status: AC
  Administered 2012-09-02: 324 mg via ORAL
  Filled 2012-09-02: qty 4

## 2012-09-02 MED ORDER — SODIUM CHLORIDE 0.9 % IV SOLN: 250.0000 mL | INTRAVENOUS | Status: DC | PRN

## 2012-09-02 MED ORDER — SODIUM CHLORIDE 0.9 % IJ SOLN: 3.0000 mL | INTRAMUSCULAR | Status: DC | PRN

## 2012-09-02 MED ORDER — ONDANSETRON HCL 4 MG/2ML IJ SOLN
4.0000 mg | Freq: Once | INTRAMUSCULAR | Status: AC
  Administered 2012-09-02: 4 mg via INTRAVENOUS
  Filled 2012-09-02: qty 2

## 2012-09-02 MED ORDER — ONDANSETRON HCL 4 MG/2ML IJ SOLN: 4.0000 mg | Freq: Four times a day (QID) | INTRAMUSCULAR | Status: DC | PRN

## 2012-09-02 MED ORDER — ASPIRIN 81 MG PO CHEW
81.0000 mg | CHEWABLE_TABLET | Freq: Every day | ORAL | Status: DC
  Administered 2012-09-03: 81 mg via ORAL
  Filled 2012-09-02: qty 1

## 2012-09-02 MED ORDER — ENOXAPARIN SODIUM 40 MG/0.4ML ~~LOC~~ SOLN
40.0000 mg | SUBCUTANEOUS | Status: DC
  Administered 2012-09-02: 40 mg via SUBCUTANEOUS
  Filled 2012-09-02 (×2): qty 0.4

## 2012-09-02 MED ORDER — SODIUM CHLORIDE 0.9 % IJ SOLN
3.0000 mL | Freq: Two times a day (BID) | INTRAMUSCULAR | Status: DC
  Administered 2012-09-02: 3 mL via INTRAVENOUS

## 2012-09-02 NOTE — ED Provider Notes (Signed)
71 year old male complaining of left-sided chest pain extending down into the abdomen. This has been associated with nausea and vomiting. on exam, there is moderate tenderness across the upper abdomen. Workup is significant for elevated lipase. In light of his vomiting at home, he should be admitted for IV hydration and pain control.    Date: 09/02/2012  Rate: 61  Rhythm: normal sinus rhythm and premature atrial contractions (PAC)  QRS Axis: normal  Intervals: PR prolonged  ST/T Wave abnormalities: nonspecific T wave changes  Conduction Disutrbances:first-degree A-V block   Narrative Interpretation: Normal sinus rhythm with PACs, first degree AV block, left ventricular hypertrophy, repolarization changes which may be due to left ventricular hypertrophy. When compared with ECG of 12/02/2011, T wave inversion in lead V4 is now present  Old EKG Reviewed: changes noted  Medical screening examination/treatment/procedure(s) were conducted as a shared visit with non-physician practitioner(s) and myself.  I personally evaluated the patient during the encounter   Dione Booze, MD 09/02/12 1553
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
## 2012-09-02 NOTE — ED Notes (Signed)
Copies of chart to EMS, transported by ambulance

## 2012-09-02 NOTE — Progress Notes (Signed)
Pt's HR dropped to 44 bpm during U/S. Pt c/o CP upon standing with rare PVC's and stated "it feels like my heart is about to pound out of my chest". Pt otherwise remained asymptomatic.
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
## 2012-09-02 NOTE — ED Notes (Signed)
Pt is sent from ucc with complaints of left sided chest pain and Productive cough on the left side.  Chest pain radiates to left arm and neck.  History of previous mi
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
## 2012-09-02 NOTE — ED Notes (Signed)
Pt place on a nasal canula 2lpm and heart monitor

## 2012-09-02 NOTE — Progress Notes (Signed)
Pt arrived to unit

## 2012-09-03 ENCOUNTER — Encounter (HOSPITAL_COMMUNITY): Payer: Self-pay | Admitting: General Practice

## 2012-09-03 DIAGNOSIS — R001 Bradycardia, unspecified: Secondary | ICD-10-CM | POA: Diagnosis present

## 2012-09-03 DIAGNOSIS — Z9114 Patient's other noncompliance with medication regimen: Secondary | ICD-10-CM

## 2012-09-03 LAB — COMPREHENSIVE METABOLIC PANEL
ALT: 9 U/L (ref 0–53)
Albumin: 3.3 g/dL — ABNORMAL LOW (ref 3.5–5.2)
Alkaline Phosphatase: 83 U/L (ref 39–117)
Chloride: 104 mEq/L (ref 96–112)
Potassium: 3.6 mEq/L (ref 3.5–5.1)
Sodium: 137 mEq/L (ref 135–145)
Total Protein: 7.1 g/dL (ref 6.0–8.3)

## 2012-09-03 LAB — CBC
HCT: 38.1 % — ABNORMAL LOW (ref 39.0–52.0)
MCH: 30.5 pg (ref 26.0–34.0)
MCHC: 33.1 g/dL (ref 30.0–36.0)
MCV: 92.3 fL (ref 78.0–100.0)
RDW: 14.9 % (ref 11.5–15.5)

## 2012-09-03 MED ORDER — ASPIRIN 81 MG PO CHEW: 81.0000 mg | CHEWABLE_TABLET | Freq: Every day | ORAL | Status: DC

## 2012-09-03 MED ORDER — AMLODIPINE BESYLATE 2.5 MG PO TABS: 2.5000 mg | ORAL_TABLET | Freq: Every day | ORAL | Status: DC

## 2012-09-03 MED ORDER — ENSURE COMPLETE PO LIQD: 237.0000 mL | Freq: Every day | ORAL | Status: DC

## 2012-09-03 MED ORDER — SIMVASTATIN 5 MG PO TABS: 5.0000 mg | ORAL_TABLET | Freq: Every day | ORAL | Status: DC

## 2012-09-03 MED ORDER — NITROGLYCERIN 0.4 MG SL SUBL: 0.4000 mg | SUBLINGUAL_TABLET | SUBLINGUAL | Status: DC | PRN

## 2012-09-03 MED ORDER — SIMVASTATIN 5 MG PO TABS
5.0000 mg | ORAL_TABLET | Freq: Every day | ORAL | Status: DC
  Filled 2012-09-03: qty 1

## 2012-09-03 MED ORDER — THIAMINE HCL 100 MG/ML IJ SOLN: 100.0000 mg | Freq: Every day | INTRAMUSCULAR | Status: DC

## 2012-09-03 MED ORDER — INFLUENZA VIRUS VACC SPLIT PF IM SUSP
0.5000 mL | INTRAMUSCULAR | Status: AC
  Administered 2012-09-03: 0.5 mL via INTRAMUSCULAR
  Filled 2012-09-03: qty 0.5

## 2012-09-03 MED ORDER — PANTOPRAZOLE SODIUM 40 MG PO TBEC: 40.0000 mg | DELAYED_RELEASE_TABLET | Freq: Every day | ORAL | Status: DC

## 2012-09-03 MED ORDER — ADULT MULTIVITAMIN W/MINERALS CH
1.0000 | ORAL_TABLET | Freq: Every day | ORAL | Status: DC
  Administered 2012-09-03: 1 via ORAL
  Filled 2012-09-03: qty 1

## 2012-09-03 MED ORDER — LORAZEPAM 1 MG PO TABS: 1.0000 mg | ORAL_TABLET | Freq: Four times a day (QID) | ORAL | Status: DC | PRN

## 2012-09-03 MED ORDER — LORAZEPAM 2 MG/ML IJ SOLN: 1.0000 mg | Freq: Four times a day (QID) | INTRAMUSCULAR | Status: DC | PRN

## 2012-09-03 MED ORDER — VITAMIN B-1 100 MG PO TABS
100.0000 mg | ORAL_TABLET | Freq: Every day | ORAL | Status: DC
  Administered 2012-09-03: 100 mg via ORAL
  Filled 2012-09-03: qty 1

## 2012-09-03 MED ORDER — FOLIC ACID 1 MG PO TABS
1.0000 mg | ORAL_TABLET | Freq: Every day | ORAL | Status: DC
  Administered 2012-09-03: 1 mg via ORAL
  Filled 2012-09-03: qty 1
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
## 2012-09-03 NOTE — Progress Notes (Signed)
Utilization Review Completed.Ruberta Holck T12/12/2011   
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
## 2012-09-03 NOTE — Progress Notes (Signed)
Inpatient Diabetes Program Recommendations  AACE/ADA: New Consensus Statement on Inpatient Glycemic Control (2013)  Target Ranges:  Prepandial:   less than 140 mg/dL      Peak postprandial:   less than 180 mg/dL (1-2 hours)      Critically ill patients:  140 - 180 mg/dL   Lab [id number] this morning.  A1C December 03, 2011 was 6.1 meaning patient was "at risk" of developing DM.    Inpatient Diabetes Program Recommendations Correction (SSI): start Novolog SENSITIVE scale TID HgbA1C: order A1C to assess prehospital glucose control Will follow. Thank you  [name] [facility] Inpatient Diabetes Coordinator [phone]

## 2012-09-03 NOTE — Progress Notes (Signed)
Patient agitated and states he is going to leave if he doesn't get any food.  Explained clear liquid diet to patient.  He states he wants "food," not liquids.  States he does not have a car and he will walk home.  Patient's daughter, [name], was called and informed of patient's intent to leave.  Patient spoke with his daughter on the phone.  She stated she does not have a car to come pick him up, but she will have her daughter come as soon as possible.  Patient refusing his telemetry and requests for his IV to be removed.  Security was called to escort patient out, but patient decided to come back if he could have some food.  MD notified of above events and order was received to give patient a full liquid diet.  Full liquid snack given.  Will continue to monitor.  Alonza Bogus

## 2012-09-03 NOTE — H&P (Signed)
Internal Medicine Teaching Service Attending Note Date: 09/03/2012  Patient name: [name]  Medical record number: [id number]  Date of birth: 06-Aug-1941    This patient has been seen and discussed with the house staff. Please see their note for complete details. I concur with their findings with the following additions/corrections: Patient is a 71 year old African American man with past medical history of coronary artery disease, hypertension, CVA and current smoker who was admitted for chief complaints of left-sided chest pain, left arm numbness and one episode of vomiting while in the ER. Patient tells me that his chest pain started about 2 months ago and has progressively been getting worse. The pain is brought on by exertion. There are no relieving factors. Patient had an episode of chest pain day prior to admission which subsided by itself. On the day of admission patient again had chest pain which was described as sharp and burning, moderate in severity, radiating to left arm, associated with palpitations and sweating.  Past medical history, past surgical history, family history and social history was reviewed and as documented in the resident's note.  BP 150/85  Pulse 87  Temp 97.6 F (36.4 C) (Oral)  Resp 18  Ht 5\' 9"  (1.753 m)  Wt 144 lb 13.5 oz (65.7 kg)  BMI 21.39 kg/m2  SpO2 100% Physical exam was consistent with reproducible left sided chest pain. Patient has left hand deformity. No murmurs rubs or gallops were noted. Clear to auscultation bilaterally without any wheezes or crackles. Patient did not have any peripheral edema and had intact pulses.   EKG done in the ER was suggestive of T-wave inversions in 2, 3 and aVF and T wave inversions in lead V4 and V5 suggestive of inferolateral ischemia, patient also has a first degree AV block. Repeat EKG showed some normalization of T-wave inversions in lead 2, 3 and aVF but persistent inversions in precordial leads.  Patient's  laboratory findings and imaging studies were reviewed.  Assessment and plan: The patient has new onset chest pain since last 2 months which has both typical and atypical features. Patient has risk factors which include age, smoking, previous history of coronary artery disease, hypertension. The pain is reproducible on palpation which is atypical for acute coronary syndrome. Given that patient has very poor outpatient followup I would recommend getting a nuclear stress test as inpatient. We will also risk stratify the patient and start him on optimal medical therapy at this time.   Lars Mage 09/03/2012, 3:18 PM

## 2012-09-03 NOTE — ED Provider Notes (Signed)
Medical screening examination/treatment/procedure(s) were performed by non-physician practitioner and as supervising physician I was immediately available for consultation/collaboration.  Leslee Home, M.D.   Reuben Likes, MD 09/03/12 [phone]
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
## 2012-09-03 NOTE — Progress Notes (Signed)
[name] tried a clear liquid diet last night and tolerated it well without N/V or increased abdominal pain. This morning he reports to nursing staff that he is hungry and wants to leave AMA. I talked to [name] briefly but he insisted on leaving AMA if he did not have food. In reviewing his lab results, his lipase is only mildly elevated and he has tolerated caloric intake per mouth well. Will advance his diet to full liquid. In regards to his agitation, he has history of EthOH abuse but daily amount of alcohol and last drink are unclear. The patient was placed on CIWA protocol with thiamine, folate, and MV supplementation.   SignedKy Barban 09/03/12, 5:23AM

## 2012-09-06 ENCOUNTER — Telehealth: Payer: Self-pay | Admitting: Licensed Clinical Social Worker

## 2012-09-06 NOTE — Telephone Encounter (Signed)
CSW placed called to pt.  CSW left message requesting return call. CSW provided contact hours and phone number. 

## 2012-09-10 NOTE — Telephone Encounter (Signed)
CSW left message indicating pt has follow up appt tomorrow.   CSW left message requesting return call. CSW provided contact hours and phone number in case pt should need assistance.  CSW will sign off as pt has appt tomorrow.

## 2012-09-11 ENCOUNTER — Ambulatory Visit: Payer: Medicare Other | Admitting: Internal Medicine

## 2012-10-14 ENCOUNTER — Other Ambulatory Visit: Payer: Self-pay | Admitting: Internal Medicine

## 2012-10-14 NOTE — Telephone Encounter (Signed)
NOT [facility] PT 

## 2012-12-12 ENCOUNTER — Inpatient Hospital Stay (HOSPITAL_COMMUNITY)
Admission: EM | Admit: 2012-12-12 | Discharge: 2012-12-14 | DRG: 439 | Disposition: A | Discharge status: Home / Self Care | Payer: Medicare Other | Attending: Internal Medicine | Admitting: Internal Medicine

## 2012-12-12 ENCOUNTER — Encounter (HOSPITAL_COMMUNITY): Payer: Self-pay | Admitting: *Deleted

## 2012-12-12 ENCOUNTER — Emergency Department (HOSPITAL_COMMUNITY): Payer: Medicare Other

## 2012-12-12 DIAGNOSIS — R109 Unspecified abdominal pain: Secondary | ICD-10-CM | POA: Diagnosis present

## 2012-12-12 DIAGNOSIS — K859 Acute pancreatitis without necrosis or infection, unspecified: Principal | ICD-10-CM | POA: Diagnosis present

## 2012-12-12 DIAGNOSIS — K5792 Diverticulitis of intestine, part unspecified, without perforation or abscess without bleeding: Secondary | ICD-10-CM | POA: Diagnosis present

## 2012-12-12 DIAGNOSIS — D72829 Elevated white blood cell count, unspecified: Secondary | ICD-10-CM | POA: Diagnosis present

## 2012-12-12 DIAGNOSIS — E876 Hypokalemia: Secondary | ICD-10-CM | POA: Diagnosis present

## 2012-12-12 DIAGNOSIS — K861 Other chronic pancreatitis: Secondary | ICD-10-CM | POA: Diagnosis present

## 2012-12-12 DIAGNOSIS — I639 Cerebral infarction, unspecified: Secondary | ICD-10-CM

## 2012-12-12 DIAGNOSIS — J9819 Other pulmonary collapse: Secondary | ICD-10-CM | POA: Diagnosis present

## 2012-12-12 DIAGNOSIS — Z8673 Personal history of transient ischemic attack (TIA), and cerebral infarction without residual deficits: Secondary | ICD-10-CM

## 2012-12-12 DIAGNOSIS — I10 Essential (primary) hypertension: Secondary | ICD-10-CM | POA: Diagnosis present

## 2012-12-12 DIAGNOSIS — F1910 Other psychoactive substance abuse, uncomplicated: Secondary | ICD-10-CM

## 2012-12-12 DIAGNOSIS — K5732 Diverticulitis of large intestine without perforation or abscess without bleeding: Secondary | ICD-10-CM | POA: Diagnosis present

## 2012-12-12 DIAGNOSIS — Z9119 Patient's noncompliance with other medical treatment and regimen: Secondary | ICD-10-CM

## 2012-12-12 DIAGNOSIS — F1010 Alcohol abuse, uncomplicated: Secondary | ICD-10-CM | POA: Diagnosis present

## 2012-12-12 LAB — CBC WITH DIFFERENTIAL/PLATELET
Basophils Absolute: 0 10*3/uL (ref 0.0–0.1)
Eosinophils Absolute: 0 10*3/uL (ref 0.0–0.7)
HCT: 41.7 % (ref 39.0–52.0)
HCT: 40.7 % (ref 39.0–52.0)
Hemoglobin: 14.2 g/dL (ref 13.0–17.0)
Lymphocytes Relative: 11 % — ABNORMAL LOW (ref 12–46)
Lymphs Abs: 1.8 10*3/uL (ref 0.7–4.0)
MCH: 31.1 pg (ref 26.0–34.0)
MCHC: 34.1 g/dL (ref 30.0–36.0)
MCV: 91.4 fL (ref 78.0–100.0)
Monocytes Absolute: 1.7 10*3/uL — ABNORMAL HIGH (ref 0.1–1.0)
Monocytes Relative: 10 % (ref 3–12)
Neutro Abs: 13.4 10*3/uL — ABNORMAL HIGH (ref 1.7–7.7)
Neutrophils Relative %: 80 % — ABNORMAL HIGH (ref 43–77)
Platelets: 279 10*3/uL (ref 150–400)
RBC: 4.56 MIL/uL (ref 4.22–5.81)
RBC: 4.42 MIL/uL (ref 4.22–5.81)
RDW: 13.5 % (ref 11.5–15.5)
WBC: 16.9 10*3/uL — ABNORMAL HIGH (ref 4.0–10.5)

## 2012-12-12 LAB — COMPREHENSIVE METABOLIC PANEL
ALT: 14 U/L (ref 0–53)
AST: 16 U/L (ref 0–37)
Alkaline Phosphatase: 96 U/L (ref 39–117)
Alkaline Phosphatase: 91 U/L (ref 39–117)
BUN: 15 mg/dL (ref 6–23)
CO2: 24 mEq/L (ref 19–32)
Chloride: 96 mEq/L (ref 96–112)
Creatinine, Ser: 0.69 mg/dL (ref 0.50–1.35)
GFR calc Af Amer: >90 mL/min (ref >90)
GFR calc non Af Amer: >90 mL/min (ref >90)
Glucose, Bld: 138 mg/dL — ABNORMAL HIGH (ref 70–99)
Potassium: 3.5 mEq/L (ref 3.5–5.1)
Sodium: 132 mEq/L — ABNORMAL LOW (ref 135–145)
Total Bilirubin: 0.9 mg/dL (ref 0.3–1.2)
Total Bilirubin: 0.8 mg/dL (ref 0.3–1.2)
Total Protein: 8 g/dL (ref 6.0–8.3)

## 2012-12-12 LAB — URINE MICROSCOPIC-ADD ON

## 2012-12-12 LAB — URINALYSIS, ROUTINE W REFLEX MICROSCOPIC
Bilirubin Urine: NEGATIVE
Glucose, UA: NEGATIVE mg/dL
Hgb urine dipstick: NEGATIVE
Ketones, ur: NEGATIVE mg/dL
Specific Gravity, Urine: 1.025 (ref 1.005–1.030)
pH: 7 (ref 5.0–8.0)

## 2012-12-12 LAB — TROPONIN I: Troponin I: <0.3 ng/mL (ref <0.30)

## 2012-12-12 LAB — LACTIC ACID, PLASMA: Lactic Acid, Venous: 1.2 mmol/L (ref 0.5–2.2)

## 2012-12-12 LAB — RAPID URINE DRUG SCREEN, HOSP PERFORMED
Barbiturates: NOT DETECTED
Tetrahydrocannabinol: NOT DETECTED

## 2012-12-12 MED ORDER — IOHEXOL 300 MG/ML  SOLN
50.0000 mL | Freq: Once | INTRAMUSCULAR | Status: AC | PRN
  Administered 2012-12-12: 50 mL via ORAL

## 2012-12-12 MED ORDER — SODIUM CHLORIDE 0.9 % IV BOLUS (SEPSIS)
1000.0000 mL | Freq: Once | INTRAVENOUS | Status: AC
Start: 2012-12-12 — End: 2012-12-12
  Administered 2012-12-12: 1000 mL via INTRAVENOUS

## 2012-12-12 MED ORDER — METRONIDAZOLE IN NACL 5-0.79 MG/ML-% IV SOLN: 500.0000 mg | Freq: Once | INTRAVENOUS | Status: DC

## 2012-12-12 MED ORDER — DEXTROSE 5 % IV SOLN
1.0000 g | Freq: Once | INTRAVENOUS | Status: AC
  Administered 2012-12-12: 1 g via INTRAVENOUS
  Filled 2012-12-12: qty 10

## 2012-12-12 MED ORDER — CIPROFLOXACIN IN D5W 400 MG/200ML IV SOLN
400.0000 mg | Freq: Once | INTRAVENOUS | Status: DC
  Filled 2012-12-12: qty 200

## 2012-12-12 MED ORDER — DEXTROSE 5 % IV SOLN: 500.0000 mg | Freq: Once | INTRAVENOUS | Status: DC

## 2012-12-12 MED ORDER — PANTOPRAZOLE SODIUM 40 MG IV SOLR
40.0000 mg | INTRAVENOUS | Status: DC
  Administered 2012-12-12 – 2012-12-13 (×2): 40 mg via INTRAVENOUS
  Filled 2012-12-12 (×3): qty 40

## 2012-12-12 MED ORDER — LORAZEPAM 2 MG/ML IJ SOLN: 1.0000 mg | Freq: Once | INTRAMUSCULAR | Status: DC

## 2012-12-12 MED ORDER — ONDANSETRON HCL 4 MG/2ML IJ SOLN: 4.0000 mg | Freq: Four times a day (QID) | INTRAMUSCULAR | Status: DC | PRN

## 2012-12-12 MED ORDER — METRONIDAZOLE 500 MG PO TABS: 2000.0000 mg | ORAL_TABLET | Freq: Once | ORAL | Status: DC

## 2012-12-12 MED ORDER — METRONIDAZOLE IN NACL 5-0.79 MG/ML-% IV SOLN
500.0000 mg | Freq: Once | INTRAVENOUS | Status: AC
  Administered 2012-12-12: 500 mg via INTRAVENOUS
  Filled 2012-12-12: qty 100

## 2012-12-12 MED ORDER — LORAZEPAM 2 MG/ML IJ SOLN
1.0000 mg | Freq: Once | INTRAMUSCULAR | Status: AC
  Administered 2012-12-12: 1 mg via INTRAVENOUS
  Filled 2012-12-12: qty 1

## 2012-12-12 MED ORDER — METRONIDAZOLE 500 MG PO TABS: 2000.0000 mg | ORAL_TABLET | Freq: Three times a day (TID) | ORAL | Status: DC

## 2012-12-12 MED ORDER — IOHEXOL 300 MG/ML  SOLN
100.0000 mL | Freq: Once | INTRAMUSCULAR | Status: AC | PRN
  Administered 2012-12-12: 100 mL via INTRAVENOUS

## 2012-12-12 MED ORDER — ONDANSETRON HCL 4 MG/2ML IJ SOLN
4.0000 mg | Freq: Once | INTRAMUSCULAR | Status: AC
  Administered 2012-12-12: 4 mg via INTRAVENOUS
  Filled 2012-12-12: qty 2

## 2012-12-12 MED ORDER — ALBUTEROL SULFATE (5 MG/ML) 0.5% IN NEBU: 2.5000 mg | INHALATION_SOLUTION | RESPIRATORY_TRACT | Status: DC | PRN

## 2012-12-12 MED ORDER — CIPROFLOXACIN IN D5W 400 MG/200ML IV SOLN
400.0000 mg | Freq: Once | INTRAVENOUS | Status: DC
  Administered 2012-12-12: 400 mg via INTRAVENOUS
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
## 2012-12-12 NOTE — ED Notes (Signed)
Pt reports mid abd pain and belching-reports pain gets worse when belching.  Pt also reports SOB.

## 2012-12-13 ENCOUNTER — Inpatient Hospital Stay (HOSPITAL_COMMUNITY): Payer: Medicare Other

## 2012-12-13 DIAGNOSIS — I6350 Cerebral infarction due to unspecified occlusion or stenosis of unspecified cerebral artery: Secondary | ICD-10-CM

## 2012-12-13 LAB — COMPREHENSIVE METABOLIC PANEL
Albumin: 2.6 g/dL — ABNORMAL LOW (ref 3.5–5.2)
Alkaline Phosphatase: 77 U/L (ref 39–117)
BUN: 11 mg/dL (ref 6–23)
BUN: 11 mg/dL (ref 6–23)
CO2: 22 mEq/L (ref 19–32)
Calcium: 8.7 mg/dL (ref 8.4–10.5)
Chloride: 100 mEq/L (ref 96–112)
Creatinine, Ser: 0.71 mg/dL (ref 0.50–1.35)
Creatinine, Ser: 0.73 mg/dL (ref 0.50–1.35)
GFR calc Af Amer: >90 mL/min (ref >90)
GFR calc Af Amer: >90 mL/min (ref >90)
GFR calc non Af Amer: >90 mL/min (ref >90)
Glucose, Bld: 123 mg/dL — ABNORMAL HIGH (ref 70–99)
Glucose, Bld: 132 mg/dL — ABNORMAL HIGH (ref 70–99)
Potassium: 3.2 mEq/L — ABNORMAL LOW (ref 3.5–5.1)
Total Bilirubin: 0.7 mg/dL (ref 0.3–1.2)
Total Bilirubin: 0.7 mg/dL (ref 0.3–1.2)

## 2012-12-13 LAB — CBC
HCT: 37.2 % — ABNORMAL LOW (ref 39.0–52.0)
Hemoglobin: 12.5 g/dL — ABNORMAL LOW (ref 13.0–17.0)
MCH: 31.1 pg (ref 26.0–34.0)
MCHC: 33.6 g/dL (ref 30.0–36.0)
MCV: 92.5 fL (ref 78.0–100.0)
RBC: 4.02 MIL/uL — ABNORMAL LOW (ref 4.22–5.81)

## 2012-12-13 LAB — PROTIME-INR
INR: 1.19 (ref 0.00–1.49)
Prothrombin Time: 14.9 seconds (ref 11.6–15.2)

## 2012-12-13 LAB — LIPID PANEL
HDL: 46 mg/dL (ref >39)
LDL Cholesterol: 30 mg/dL (ref 0–99)

## 2012-12-13 LAB — CBC WITH DIFFERENTIAL/PLATELET
Basophils Absolute: 0 10*3/uL (ref 0.0–0.1)
Eosinophils Absolute: 0 10*3/uL (ref 0.0–0.7)
Lymphocytes Relative: 13 % (ref 12–46)
Lymphs Abs: 1.9 10*3/uL (ref 0.7–4.0)
MCHC: 33.6 g/dL (ref 30.0–36.0)
Monocytes Relative: 13 % — ABNORMAL HIGH (ref 3–12)
Platelets: 256 10*3/uL (ref 150–400)
RDW: 13.8 % (ref 11.5–15.5)
WBC: 14.5 10*3/uL — ABNORMAL HIGH (ref 4.0–10.5)

## 2012-12-13 LAB — GLUCOSE, CAPILLARY: Glucose-Capillary: 104 mg/dL — ABNORMAL HIGH (ref 70–99)

## 2012-12-13 LAB — PHOSPHORUS: Phosphorus: 2.2 mg/dL — ABNORMAL LOW (ref 2.3–4.6)

## 2012-12-13 LAB — LIPASE, BLOOD: Lipase: 116 U/L — ABNORMAL HIGH (ref 11–59)

## 2012-12-13 MED ORDER — CALCIUM CARBONATE ANTACID 500 MG PO CHEW
1.0000 | CHEWABLE_TABLET | Freq: Every day | ORAL | Status: DC
  Administered 2012-12-13 – 2012-12-14 (×2): 200 mg via ORAL
  Filled 2012-12-13 (×2): qty 1

## 2012-12-13 MED ORDER — ASPIRIN 81 MG PO CHEW
81.0000 mg | CHEWABLE_TABLET | Freq: Every day | ORAL | Status: DC
Start: 2012-12-13 — End: 2012-12-14
  Administered 2012-12-13 – 2012-12-14 (×2): 81 mg via ORAL
  Filled 2012-12-13 (×2): qty 1

## 2012-12-13 MED ORDER — ADULT MULTIVITAMIN W/MINERALS CH
1.0000 | ORAL_TABLET | Freq: Every day | ORAL | Status: DC
  Administered 2012-12-13 – 2012-12-14 (×2): 1 via ORAL
  Filled 2012-12-13 (×2): qty 1

## 2012-12-13 MED ORDER — VITAMIN B-1 100 MG PO TABS
100.0000 mg | ORAL_TABLET | Freq: Every day | ORAL | Status: DC
  Administered 2012-12-13 – 2012-12-14 (×2): 100 mg via ORAL
  Filled 2012-12-13 (×2): qty 1

## 2012-12-13 MED ORDER — LORAZEPAM 1 MG PO TABS: 1.0000 mg | ORAL_TABLET | Freq: Four times a day (QID) | ORAL | Status: DC | PRN

## 2012-12-13 MED ORDER — POTASSIUM CHLORIDE CRYS ER 20 MEQ PO TBCR
40.0000 meq | EXTENDED_RELEASE_TABLET | Freq: Once | ORAL | Status: AC
  Administered 2012-12-13: 40 meq via ORAL
  Filled 2012-12-13: qty 2

## 2012-12-13 MED ORDER — SIMVASTATIN 5 MG PO TABS
5.0000 mg | ORAL_TABLET | Freq: Every day | ORAL | Status: DC
Start: 2012-12-13 — End: 2012-12-14
  Administered 2012-12-13: 5 mg via ORAL
  Filled 2012-12-13 (×3): qty 1

## 2012-12-13 MED ORDER — POTASSIUM CHLORIDE IN NACL 20-0.9 MEQ/L-% IV SOLN
INTRAVENOUS | Status: DC
  Administered 2012-12-13 – 2012-12-14 (×3): via INTRAVENOUS
  Filled 2012-12-13 (×5): qty 1000

## 2012-12-13 MED ORDER — HYDRALAZINE HCL 20 MG/ML IJ SOLN: 10.0000 mg | Freq: Four times a day (QID) | INTRAMUSCULAR | Status: DC | PRN

## 2012-12-13 MED ORDER — FOLIC ACID 1 MG PO TABS
1.0000 mg | ORAL_TABLET | Freq: Every day | ORAL | Status: DC
  Administered 2012-12-13 – 2012-12-14 (×2): 1 mg via ORAL
  Filled 2012-12-13 (×2): qty 1

## 2012-12-13 MED ORDER — ACETAMINOPHEN 650 MG RE SUPP: 650.0000 mg | Freq: Four times a day (QID) | RECTAL | Status: DC | PRN

## 2012-12-13 MED ORDER — HALOPERIDOL LACTATE 5 MG/ML IJ SOLN
INTRAMUSCULAR | Status: AC
  Administered 2012-12-13: 2 mg
  Filled 2012-12-13: qty 1

## 2012-12-13 MED ORDER — ACETAMINOPHEN 325 MG PO TABS: 650.0000 mg | ORAL_TABLET | Freq: Four times a day (QID) | ORAL | Status: DC | PRN

## 2012-12-13 MED ORDER — HALOPERIDOL LACTATE 5 MG/ML IJ SOLN: 2.0000 mg | Freq: Four times a day (QID) | INTRAMUSCULAR | Status: DC | PRN

## 2012-12-13 MED ORDER — MORPHINE SULFATE 2 MG/ML IJ SOLN
1.0000 mg | INTRAMUSCULAR | Status: DC | PRN
  Administered 2012-12-13: 1 mg via INTRAVENOUS
  Filled 2012-12-13: qty 1

## 2012-12-13 MED ORDER — ENSURE COMPLETE PO LIQD
237.0000 mL | Freq: Every day | ORAL | Status: DC
  Administered 2012-12-13: 237 mL via ORAL

## 2012-12-13 MED ORDER — SODIUM CHLORIDE 0.9 % IV SOLN
INTRAVENOUS | Status: DC
  Administered 2012-12-13: via INTRAVENOUS

## 2012-12-13 MED ORDER — CHLORDIAZEPOXIDE HCL 5 MG PO CAPS
10.0000 mg | ORAL_CAPSULE | Freq: Three times a day (TID) | ORAL | Status: DC
  Administered 2012-12-13 (×2): 10 mg via ORAL
  Filled 2012-12-13 (×2): qty 2

## 2012-12-13 MED ORDER — CIPROFLOXACIN IN D5W 400 MG/200ML IV SOLN
400.0000 mg | Freq: Two times a day (BID) | INTRAVENOUS | Status: DC
Start: 2012-12-13 — End: 2012-12-14
  Administered 2012-12-13 (×2): 400 mg via INTRAVENOUS
  Filled 2012-12-13 (×5): qty 200

## 2012-12-13 MED ORDER — NITROGLYCERIN 0.4 MG SL SUBL: 0.4000 mg | SUBLINGUAL_TABLET | SUBLINGUAL | Status: DC | PRN

## 2012-12-13 MED ORDER — THIAMINE HCL 100 MG/ML IJ SOLN
100.0000 mg | Freq: Every day | INTRAMUSCULAR | Status: DC
  Filled 2012-12-13 (×2): qty 1

## 2012-12-13 MED ORDER — LORAZEPAM 2 MG/ML IJ SOLN
1.0000 mg | Freq: Four times a day (QID) | INTRAMUSCULAR | Status: DC | PRN
  Administered 2012-12-13: 1 mg via INTRAVENOUS
  Filled 2012-12-13: qty 1

## 2012-12-13 NOTE — Progress Notes (Addendum)
[facility]                                                                                Patient Demographics  [name], is a 72 y.o. male, DOB - [birth year], JWJ:[id number], AOZ:[id number]  Admit date - 12/12/2012  Admitting Physician Alison Murray, MD  Outpatient Primary MD for the patient is No primary provider on file.  LOS - 1   Chief Complaint  Patient presents with  . Shortness of Breath  . Abdominal Pain  . Emesis        Assessment & Plan    Summary  72 year old male with past medical history of hypertension who presented to [facility] ED with worsening abdominal pain started 1 day prior to this admission. Patient reports pain as dull, 7-8/10 in intensity, mostly in mid abdominal and periumbilical area and non radiating. Patient also reported associated nausea and nonbloody vomiting. Patient also reported poor oral intake over the same period of time. He does drink wine rather frequently, almost on daily basis. No reports of diarrhea or constipation. No reports of blood in stool or urine. Patient did not report fever or chills at home. No reports of chest pain, no palpitations. Patient did report nonproductive cough and occasional shortness of breath in addition to the above complaints.   In ED, evaluation included CT abdomen/pelvis which was concerning for diverticulitis in splenic flexure and /or acute pancreatitis. CXR revealed opacities in left lower lobe. His CBC revealed leukocytosis of 17.8 and he has spike fever of 100.9 F while in ED. Patient was given cipro and flagyl in ED for possible diverticulitis and azithromycin and ceftriaxone for possible pneumonia. His urinalysis has revealed slight leukocyte esterases.         Abdominal pain, other specified site  Acute pancreatitis along with diverticulitis causing leukocytosis.  Lipase level is elevated at 165 on admission. Alcohol level and US WNL. Lactic acid WNL. Stable lipid panel, will order right  upper quadrant ultrasound. For now bowel rest, IV fluids, supportive care, IV Cipro Flagyl, and requested GI physician Dr. Elnoria Howard to see the patient. Follow up results of urine and blood cultures.    ? community acquired pneumonia  Based on CXR, left lower lobe infiltrate versus atelectasis, patient has no cough or shortness of breath, doubt she has pneumonia Antibiotics as #1 above we'll monitor clinically, repeat 2 view chest x-ray in the morning  Albuterol nebulizer Q 4 hours PRN shortness of breath and/or wheezing     HTN -  Stable we will monitor clinically not requiring any medications. When necessary IV hydralazine ordered.    History of CVA  Daily shoes continue aspirin and Zocor the   Low K  Replace and monitor.   ? Alcohol Abuse  CIWA, vitamins, Librium scheduled.   Code Status: Full  Family Communication: Patient no family around  Disposition Plan: To be decided   Procedures CT scan abdomen pelvis   Consults  GI-Hund   DVT Prophylaxis  SCD  Lab Results  Component Value Date   PLT 289 12/13/2012    Medications  Scheduled Meds: . aspirin  81 mg Oral Daily  .  calcium carbonate  1 tablet Oral Daily  . ciprofloxacin  400 mg Intravenous Q12H  . feeding supplement  237 mL Oral Q1500  . metronidazole  500 mg Intravenous Once  . pantoprazole (PROTONIX) IV  40 mg Intravenous Q24H  . simvastatin  5 mg Oral QHS   Continuous Infusions: . 0.9 % NaCl with KCl 20 mEq / L 125 mL/hr at 12/13/12 1023   PRN Meds:.acetaminophen, acetaminophen, albuterol, morphine injection, nitroGLYCERIN, ondansetron (ZOFRAN) IV  Antibiotics    Anti-infectives   Start     Dose/Rate Route Frequency Ordered Stop   12/13/12 1145  ciprofloxacin (CIPRO) IVPB 400 mg     400 mg 200 mL/hr over 60 Minutes Intravenous Every 12 hours 12/13/12 1142     12/13/12 0600  metroNIDAZOLE (FLAGYL) tablet 2,000 mg  Status:  Discontinued     2,000 mg Oral 3 times per day 12/12/12 2014  12/12/12 2015   12/12/12 2130  metroNIDAZOLE (FLAGYL) IVPB 500 mg     500 mg 100 mL/hr over 60 Minutes Intravenous  Once 12/12/12 2128 12/12/12 2330   12/12/12 2030  ciprofloxacin (CIPRO) IVPB 400 mg  Status:  Discontinued     400 mg 200 mL/hr over 60 Minutes Intravenous  Once 12/12/12 2020 12/12/12 2128   12/12/12 2030  metroNIDAZOLE (FLAGYL) IVPB 500 mg     500 mg 100 mL/hr over 60 Minutes Intravenous  Once 12/12/12 2020     12/12/12 2000  ciprofloxacin (CIPRO) IVPB 400 mg  Status:  Discontinued     400 mg 200 mL/hr over 60 Minutes Intravenous  Once 12/12/12 1947 12/12/12 2138   12/12/12 2000  metroNIDAZOLE (FLAGYL) tablet 2,000 mg  Status:  Discontinued     2,000 mg Oral  Once 12/12/12 1947 12/12/12 2014   12/12/12 1830  cefTRIAXone (ROCEPHIN) 1 g in dextrose 5 % 50 mL IVPB     1 g 100 mL/hr over 30 Minutes Intravenous  Once 12/12/12 1826 12/12/12 1943   12/12/12 1830  azithromycin (ZITHROMAX) 500 mg in dextrose 5 % 250 mL IVPB  Status:  Discontinued     500 mg 250 mL/hr over 60 Minutes Intravenous  Once 12/12/12 1826 12/12/12 2020       Time Spent in minutes   35   SINGH,PRASHANT K M.D on 12/13/2012 at 11:43 AM  Between 7am to 7pm - Pager - [phone]  After 7pm go to [email or web address] - password [email or web address]  And look for the night coverage person covering for me after hours  [facility] Office  [phone]    Subjective:   Nechemia Chiappetta today has, No headache, No chest pain, upper quadrant abdominal pain with mild  Nausea, No new weakness tingling or numbness, No Cough - SOB.    Objective:   Filed Vitals:   12/12/12 1756 12/12/12 2013 12/12/12 2145 12/13/12 0610  BP:  146/88 148/64 127/74  Pulse:  111 105 109  Temp: 100.9 F (38.3 C) 99.1 F (37.3 C) 98 F (36.7 C) 99.3 F (37.4 C)  TempSrc: Rectal Oral Oral Oral  Resp:  22 18 18   Height:   5\' 9"  (1.753 m)   Weight:   63.821 kg (140 lb 11.2 oz)   SpO2:  94% 96% 94%    Wt Readings from Last 3  Encounters:  12/12/12 63.821 kg (140 lb 11.2 oz)  09/03/12 65.7 kg (144 lb 13.5 oz)  12/18/11 69.854 kg (154 lb)    No intake or output data in the 24  hours ending 12/13/12 1143  Exam Awake Alert, Oriented X 3, No new F.N deficits, Normal affect Real.AT,PERRAL Supple Neck,No JVD, No cervical lymphadenopathy appriciated.  Symmetrical Chest wall movement, Good air movement bilaterally, CTAB RRR,No Gallops,Rubs or new Murmurs, No Parasternal Heave +ve B.Sounds, Abd Soft, Non tender, No organomegaly appriciated, No rebound - guarding or rigidity. No Cyanosis, Clubbing or edema, No new Rash or bruise     Data Review   Micro Results No results found for this or any previous visit (from the past 240 hour(s)).  Radiology Reports Dg Chest 2 View  12/12/2012  *RADIOLOGY REPORT*  Clinical Data: Shortness of breath.  Generalized weakness.  CHEST - 2 VIEW  Comparison: Two-view chest x-ray 09/02/2012, 12/18/2011, 07/21/2011.  Findings: Cardiac silhouette enlarged but stable.  Thoracic aorta tortuous and atherosclerotic, unchanged.  Hilar and mediastinal contours otherwise unremarkable.  Interval development of patchy and streaky opacities in the left lower lobe.  Lungs otherwise clear.  Pulmonary vascularity normal.  No visible pleural effusions.  Visualized bony thorax intact.  IMPRESSION: Left lower lobe atelectasis versus bronchopneumonia.  Stable cardiomegaly without pulmonary edema.   Original Report Authenticated By: Hulan Saas, M.D.    US Abdomen Complete  12/13/2012  *RADIOLOGY REPORT*  Clinical Data:  Pancreatitis.  COMPLETE ABDOMINAL ULTRASOUND  Comparison:  CT abdomen and pelvis 12/12/2012 and abdominal ultrasound 09/02/2012.  Findings:  Gallbladder:  Multiple small stones are seen within the gallbladder as on CT scan.  There is no pericholecystic fluid or gallbladder wall thickening.  Sonographer reports negative Murphy's sign.  Common bile duct:  Measures 0.5 cm.  Liver:  No focal  lesion identified.  Within normal limits in parenchymal echogenicity. There is some perihepatic ascites.  IVC:  Appears normal.  Pancreas:  Not visualized due to overlying bowel gas.  Spleen:  Measures 7.9 cm and appears normal.  Right Kidney:  Measures 10.0 cm and appears normal.  Left Kidney:  Measures 10.9 cm and appears normal.  Abdominal aorta:  No aneurysm identified.  IMPRESSION:  1.  The pancreas is not visualized due to overlying bowel gas. 2.  Multiple small gallstones without evidence of cholecystitis. 3.  Small volume of perihepatic ascites.   Original Report Authenticated By: Holley Dexter, M.D.    Ct Abdomen Pelvis W Contrast  12/12/2012  *RADIOLOGY REPORT*  Clinical Data: Abdominal pain, belching, chronic pancreatitis  CT ABDOMEN AND PELVIS WITH CONTRAST  Technique:  Multidetector CT imaging of the abdomen and pelvis was performed following the standard protocol during bolus administration of intravenous contrast.  Contrast:  OMNIPAQUE IOHEXOL 300 MG/ML  SOLN  Comparison: 04/30/2008  Findings: Minimal streaky bibasilar atelectasis.  Mild cardiac enlargement.  No pericardial or pleural effusion.  Moderately large hiatal hernia noted.  Abdomen:  Diffuse left upper quadrant strandy edema adjacent to the greater curvature of the stomach, spleen laterally, and the colon anteriorly.  There does appear be focal wall thickening of the colon, image 23.  Diverticular changes are noted.  This could be related to acute diverticulitis of the splenic flexure.  Other considerations would include gastritis and possibly acute on chronic pancreatitis.  Pancreas demonstrates diffuse ductal dilatation and parenchymal calcifications.  No fluid collection to suggest pseudocyst formation or abscess.  No intra hepatic biliary dilatation.  No focal hepatic abnormality. Hepatic and portal veins are patent.  Gallbladder contains numerous calcified layering gallstones.  Atherosclerosis of the aorta evident without  aneurysm.  Spleen, adrenal glands, and kidneys demonstrate no acute process. Small sub  centimeter cortical cysts in the left kidney.  Negative for bowel obstruction, dilatation, ileus pattern, or free air.  Pelvis:  Diverticulosis of the descending colon and sigmoid noted. Trace pelvic free fluid, images 64 and 67.  Prostate gland is enlarged with coarse calcifications.  Urinary bladder demonstrates a diverticulum of the dome.  No pelvic hemorrhage, abscess, adenopathy, inguinal abnormality, or hernia.  Diffuse degenerative changes of the spine.  IMPRESSION: Left upper quadrant inflammatory stranding / edema.  Differential considerations include diverticulitis of the splenic flexure the colon, and/or possibly adjacent gastritis or pancreatitis.  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  This has progressed since 2009.  Moderately large hiatal hernia  Cholelithiasis  Aortic atherosclerosis  Trace pelvic free fluid  Bladder dome diverticulum   Original Report Authenticated By: Judie Petit. Shick, M.D.     [facility]  Recent Labs Lab 12/12/12 1615 12/12/12 1730 12/13/12 0024 12/13/12 0345  WBC 17.8* 16.9* 14.5* 14.8*  HGB 14.2 13.8 12.6* 12.5*  HCT 41.7 40.7 37.5* 37.2*  PLT 302 279 256 289  MCV 91.4 92.1 91.2 92.5  MCH 31.1 31.2 30.7 31.1  MCHC 34.1 33.9 33.6 33.6  RDW 13.8 13.5 13.8 13.6  LYMPHSABS 1.8 1.8 1.9  --   MONOABS 1.7* 1.7* 1.9*  --   EOSABS 0.0 0.0 0.0  --   BASOSABS 0.0 0.0 0.0  --     Chemistries   Recent Labs Lab 12/12/12 1615 12/12/12 1730 12/13/12 0024 12/13/12 0345  NA 133* 132* 132* 134*  K 3.5 3.5 3.4* 3.2*  CL 97 96 99 100  CO2 23 24 22 23   GLUCOSE 138* 126* 123* 132*  BUN 15 15 11 11   CREATININE 0.69 0.71 0.71 0.73  CALCIUM 9.4 9.3 8.7 8.7  MG  --   --  1.6  --   AST 14 16 12 12   ALT 14 14 11 10   ALKPHOS 96 91 77 77  BILITOT 0.9 0.8 0.7 0.7    ------------------------------------------------------------------------------------------------------------------ estimated creatinine clearance is 75.3 ml/min (by C-G formula based on Cr of 0.73). ------------------------------------------------------------------------------------------------------------------ No results found for this basename: HGBA1C,  in the last 72 hours ------------------------------------------------------------------------------------------------------------------  Recent Labs  12/13/12 0345  CHOL 91  HDL 46  LDLCALC 30  TRIG 76  CHOLHDL 2.0   ------------------------------------------------------------------------------------------------------------------  Recent Labs  12/13/12 0024  TSH 0.676   ------------------------------------------------------------------------------------------------------------------ No results found for this basename: VITAMINB12, FOLATE, FERRITIN, TIBC, IRON, RETICCTPCT,  in the last 72 hours  Coagulation profile  Recent Labs Lab 12/13/12 0024  INR 1.19    No results found for this basename: DDIMER,  in the last 72 hours  Cardiac Enzymes  Recent Labs Lab 12/12/12 1730  TROPONINI <0.30   ------------------------------------------------------------------------------------------------------------------ No components found with this basename: POCBNP,

## 2012-12-13 NOTE — Care Management (Signed)
CARE MANAGEMENT NOTE 12/13/2012  Patient:  [name], [name]   Account Number:  [id number]  Date Initiated:  12/13/2012  Documentation initiated by:  Nicholas Trompeter  Subjective/Objective Assessment:   72 yo male admitted with pancreatitis.     Action/Plan:   Home when stable   Anticipated DC Date:     Anticipated DC Plan:           Choice offered to / List presented to:             Status of service:  Completed, signed off Medicare Important Message given?   (If response is "NO", the following Medicare IM given date fields will be blank) Date Medicare IM given:   Date Additional Medicare IM given:    Discharge Disposition:    Per UR Regulation:  Reviewed for med. necessity/level of care/duration of stay  If discussed at Long Length of Stay Meetings, dates discussed:    Comments:  12/13/12 1400 Sahory Nordling,RN,BSN [phone]

## 2012-12-13 NOTE — Evaluation (Signed)
Physical Therapy Evaluation Patient Details Name: [name] MRN: [id number] DOB: January 22, 1941 Today's Date: 12/13/2012 Time: 8657-8469 PT Time Calculation (min): 25 min  PT Assessment / Plan / Recommendation Clinical Impression  72 y.o. male admitted with abdominal pain, nausea/vomiting. Dx of pancreatitis vs. diverticultis, possible CAP. Pt was independent with mobility at home, lives with daughter and SIL with 79* assist. Pt walked 77' with RW and min/guard assist. Expect pt can return home, likely no f/u PT will be needed. Will follow acutely to address safety with ambulation.     PT Assessment  Patient needs continued PT services    Follow Up Recommendations       Does the patient have the potential to tolerate intense rehabilitation      Barriers to Discharge None      Equipment Recommendations  Cane    Recommendations for Other Services OT consult   Frequency Min 3X/week    Precautions / Restrictions Precautions Precautions: Fall Restrictions Weight Bearing Restrictions: No   Pertinent Vitals/Pain *pt reports mild abdominal pain when he bends at the waist RN notified**      Mobility  Bed Mobility Bed Mobility: Supine to Sit Supine to Sit: 6: Modified independent (Device/Increase time);HOB elevated Transfers Transfers: Sit to Stand;Stand to Sit;Stand Pivot Transfers Sit to Stand: 4: Min guard Stand to Sit: 4: Min guard Stand Pivot Transfers: 4: Min guard;With armrests Details for Transfer Assistance: min/guard for safety/balance Ambulation/Gait Ambulation/Gait Assistance: 4: Min guard Ambulation Distance (Feet): 110 Feet Assistive device: Rolling walker Ambulation/Gait Assistance Details: min/guard for balance Gait Pattern: Decreased step length - right;Decreased step length - left Gait velocity: increased General Gait Details: pt rested LUE on RW, consider trial of SPC next visit    Exercises     PT Diagnosis: Difficulty walking;Acute pain  PT  Problem List: Decreased balance;Pain PT Treatment Interventions: Gait training;DME instruction;Therapeutic activities;Therapeutic exercise;Patient/family education   PT Goals Acute Rehab PT Goals PT Goal Formulation: With patient Time For Goal Achievement: 12/27/12 Potential to Achieve Goals: Good Pt will go Sit to Stand: with modified independence PT Goal: Sit to Stand - Progress: Goal set today Pt will go Stand to Sit: with modified independence PT Goal: Stand to Sit - Progress: Goal set today Pt will Ambulate: >150 feet;with modified independence PT Goal: Ambulate - Progress: Goal set today  Visit Information  Last PT Received On: 12/13/12 Assistance Needed: +1    Subjective Data  Subjective: I can walk. I've only fallen once at home.  Patient Stated Goal: to go home   Prior Functioning  Home Living Lives With: Daughter Available Help at Discharge: Family;Available 24 hours/day Type of Home: House Home Access: Stairs to enter [employer] of Steps: 2 Home Layout: Two level;Able to live on main level with bedroom/bathroom (basement, pt doesn't use) Home Adaptive Equipment: None Prior Function Level of Independence: Independent Able to Take Stairs?: Yes Driving: No Communication Communication: No difficulties    Cognition  Cognition Overall Cognitive Status: Appears within functional limits for tasks assessed/performed Arousal/Alertness: Awake/alert Orientation Level: Appears intact for tasks assessed Behavior During Session: [facility] for tasks performed    Extremity/Trunk Assessment Right Upper Extremity Assessment RUE ROM/Strength/Tone: Within functional levels RUE Sensation: WFL - Light Touch RUE Coordination: WFL - gross/fine motor Left Upper Extremity Assessment LUE ROM/Strength/Tone: Deficits LUE ROM/Strength/Tone Deficits: h/o L hand/elbow contracture (MCP extension, PIP flexion), elbow flexion LUE Coordination: Deficits LUE Coordination Deficits:  non functional 2* contractures Right Lower Extremity Assessment RLE ROM/Strength/Tone: Within functional levels  RLE Sensation: WFL - Light Touch RLE Coordination: WFL - gross/fine motor Left Lower Extremity Assessment LLE ROM/Strength/Tone: Within functional levels LLE Sensation: WFL - Light Touch LLE Coordination: WFL - gross/fine motor Trunk Assessment Trunk Assessment: Normal   Balance Balance Balance Assessed: Yes Static Sitting Balance Static Sitting - Balance Support: No upper extremity supported;Feet supported Static Sitting - Level of Assistance: 7: Independent Static Sitting - Comment/# of Minutes: 3  End of Session PT - End of Session Equipment Utilized During Treatment: Gait belt Activity Tolerance: Patient tolerated treatment well Patient left: in chair;with call bell/phone within reach Nurse Communication: Mobility status  GP     Ralene Bathe Kistler 12/13/2012, 9:35 AM [phone]

## 2012-12-13 NOTE — Evaluation (Signed)
Occupational Therapy Evaluation Patient Details Name: [name] MRN: [id number] DOB: 1941/04/23 Today's Date: 12/13/2012 Time: 4098-1191 OT Time Calculation (min): 15 min  OT Assessment / Plan / Recommendation Clinical Impression  This 72 year old man was admitted with abd pain.  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  Will follow in acute with supervision level goals.  Do not anticipate he'll need follow up  OT post acute.      OT Assessment  Patient needs continued OT Services    Follow Up Recommendations  No OT follow up    Barriers to Discharge      Equipment Recommendations   (will further assess tub dme)    Recommendations for Other Services    Frequency  Min 2X/week    Precautions / Restrictions Precautions Precautions: Fall Restrictions Weight Bearing Restrictions: No   Pertinent Vitals/Pain Abdomen when he coughs   Not rated   ADL  Grooming: Set up Where Assessed - Grooming: Supported sitting Upper Body Bathing: Set up (pt very independent and does what he can) Where Assessed - Upper Body Bathing: Supported sitting Lower Body Bathing: Min guard Where Assessed - Lower Body Bathing: Supported sit to stand Upper Body Dressing: Set up Where Assessed - Upper Body Dressing: Supported sitting Lower Body Dressing: Min guard Where Assessed - Lower Body Dressing: Supported sit to Pharmacist, hospital: Counsellor Method: Surveyor, minerals:  (chair to w/c) Toileting - Architect and Hygiene: Min guard Where Assessed - Engineer, mining and Hygiene: Sit to stand from 3-in-1 or toilet Transfers/Ambulation Related to ADLs: only performed spt:  rad tech came to transport pt to test ADL Comments: performs adls only with RUE    OT Diagnosis: Generalized weakness  OT Problem List: Decreased activity tolerance;Impaired balance (sitting and/or standing);Decreased knowledge of  use of DME or AE OT Treatment Interventions: Self-care/ADL training;Patient/family education;Balance training;DME and/or AE instruction   OT Goals Acute Rehab OT Goals OT Goal Formulation: With patient Time For Goal Achievement: 12/27/12 Potential to Achieve Goals: Good ADL Goals Pt Will Transfer to Toilet: with supervision;Ambulation;Regular height toilet;Grab bars (and all aspects of toileting) ADL Goal: Toilet Transfer - Progress: Goal set today Miscellaneous OT Goals Miscellaneous OT Goal #1: Pt will retrieve clothes and complete ADLs at supervision level OT Goal: Miscellaneous Goal #1 - Progress: Goal set today  Visit Information  Last OT Received On: 12/13/12 Assistance Needed: +1    Subjective Data  Subjective: How much therapy am I going to need Patient Stated Goal: home   Prior Functioning     Home Living Lives With: Daughter Available Help at Discharge: Family;Available 24 hours/day Type of Home: House Home Access: Stairs to enter [employer] of Steps: 2 Home Layout: Two level;Able to live on main level with bedroom/bathroom (basement, pt doesn't use) Bathroom Shower/Tub: Health visitor: Standard (grab bar) Home Adaptive Equipment: None Prior Function Level of Independence: Independent Able to Take Stairs?: Yes Driving: No Communication Communication: No difficulties         Vision/Perception     Cognition  Cognition Overall Cognitive Status: Appears within functional limits for tasks assessed/performed Arousal/Alertness: Awake/alert Orientation Level: Appears intact for tasks assessed Behavior During Session: [facility] for tasks performed    Extremity/Trunk Assessment Right Upper Extremity Assessment RUE ROM/Strength/Tone:  (5th digit contracted ) RUE Sensation: WFL - Light Touch RUE Coordination: WFL - gross/fine motor Left Upper Extremity Assessment LUE ROM/Strength/Tone: Deficits LUE  ROM/Strength/Tone Deficits: h/o L  hand/elbow contracture (MCP extension, PIP flexion), elbow flexion LUE Coordination: Deficits LUE Coordination Deficits: non functional 2* contractures  Trunk Assessment Trunk Assessment: Normal     Mobility Min guard for sit to stand     Exercise     Balance    End of Session OT - End of Session Activity Tolerance: Patient tolerated treatment well Patient left:  (radiology)  GO     SPENCER,MARYELLEN 12/13/2012, 11:12 AM Marica Otter, OTR/L [phone] 12/13/2012
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
## 2012-12-14 ENCOUNTER — Inpatient Hospital Stay (HOSPITAL_COMMUNITY): Payer: Medicare Other

## 2012-12-14 LAB — URINE CULTURE
Colony Count: NO GROWTH
Culture: NO GROWTH
Culture: NO GROWTH

## 2012-12-14 LAB — COMPREHENSIVE METABOLIC PANEL
Albumin: 2.3 g/dL — ABNORMAL LOW (ref 3.5–5.2)
Alkaline Phosphatase: 64 U/L (ref 39–117)
BUN: 8 mg/dL (ref 6–23)
Calcium: 8.7 mg/dL (ref 8.4–10.5)
Potassium: 3.6 mEq/L (ref 3.5–5.1)
Total Protein: 6 g/dL (ref 6.0–8.3)

## 2012-12-14 LAB — PROCALCITONIN: Procalcitonin: <0.1 ng/mL

## 2012-12-14 LAB — CBC
HCT: 35.7 % — ABNORMAL LOW (ref 39.0–52.0)
MCHC: 33.6 g/dL (ref 30.0–36.0)
RDW: 13.8 % (ref 11.5–15.5)

## 2012-12-14 LAB — LIPASE, BLOOD: Lipase: 51 U/L (ref 11–59)

## 2012-12-14 LAB — MAGNESIUM: Magnesium: 1.6 mg/dL (ref 1.5–2.5)

## 2012-12-14 MED ORDER — CHLORDIAZEPOXIDE HCL 5 MG PO CAPS: 5.0000 mg | ORAL_CAPSULE | Freq: Three times a day (TID) | ORAL | Status: DC

## 2012-12-14 MED ORDER — CIPROFLOXACIN HCL 500 MG PO TABS: 500.0000 mg | ORAL_TABLET | Freq: Two times a day (BID) | ORAL | Status: DC

## 2012-12-14 MED ORDER — HYDROCODONE-ACETAMINOPHEN 5-500 MG PO TABS: 1.0000 | ORAL_TABLET | Freq: Three times a day (TID) | ORAL | Status: DC | PRN

## 2012-12-14 MED ORDER — FOLIC ACID 1 MG PO TABS: 1.0000 mg | ORAL_TABLET | Freq: Every day | ORAL | Status: DC

## 2012-12-14 MED ORDER — METRONIDAZOLE 500 MG PO TABS: 500.0000 mg | ORAL_TABLET | Freq: Three times a day (TID) | ORAL | Status: DC

## 2012-12-14 MED ORDER — ENSURE COMPLETE PO LIQD: 237.0000 mL | Freq: Every day | ORAL | Status: DC

## 2012-12-14 MED ORDER — THIAMINE HCL 100 MG PO TABS: 100.0000 mg | ORAL_TABLET | Freq: Every day | ORAL | Status: DC

## 2012-12-14 NOTE — Progress Notes (Signed)
Pt D/C home, pt alert and oriented, pt in stable condition D/C instructions done, pt verbalizes understanding.
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
## 2012-12-16 LAB — GLUCOSE, CAPILLARY
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
## 2013-03-06 ENCOUNTER — Other Ambulatory Visit: Payer: Self-pay | Admitting: Internal Medicine

## 2013-03-24 ENCOUNTER — Other Ambulatory Visit: Payer: Self-pay | Admitting: Internal Medicine

## 2013-03-24 NOTE — Telephone Encounter (Signed)
It does not appear that patient is receiving his primary care through our clinic.

## 2013-04-08 ENCOUNTER — Other Ambulatory Visit: Payer: Self-pay | Admitting: Internal Medicine

## 2013-04-09 NOTE — Telephone Encounter (Signed)
Not our patient  Last seen well over a year ago, last admission > 6 months ago.   If he would like refills, he will need to come in and establish outpatient care with [place].   Thanks.

## 2013-05-04 ENCOUNTER — Observation Stay (HOSPITAL_COMMUNITY)
Admission: EM | Admit: 2013-05-04 | Discharge: 2013-05-06 | Disposition: A | Discharge status: Home / Self Care | Payer: Medicare Other | Attending: Internal Medicine | Admitting: Internal Medicine

## 2013-05-04 ENCOUNTER — Encounter (HOSPITAL_COMMUNITY): Payer: Self-pay | Admitting: Emergency Medicine

## 2013-05-04 DIAGNOSIS — I639 Cerebral infarction, unspecified: Secondary | ICD-10-CM | POA: Diagnosis present

## 2013-05-04 DIAGNOSIS — F1010 Alcohol abuse, uncomplicated: Secondary | ICD-10-CM | POA: Insufficient documentation

## 2013-05-04 DIAGNOSIS — I2510 Atherosclerotic heart disease of native coronary artery without angina pectoris: Secondary | ICD-10-CM

## 2013-05-04 DIAGNOSIS — F10121 Alcohol abuse with intoxication delirium: Secondary | ICD-10-CM

## 2013-05-04 DIAGNOSIS — Z8673 Personal history of transient ischemic attack (TIA), and cerebral infarction without residual deficits: Secondary | ICD-10-CM | POA: Insufficient documentation

## 2013-05-04 DIAGNOSIS — F17200 Nicotine dependence, unspecified, uncomplicated: Secondary | ICD-10-CM | POA: Insufficient documentation

## 2013-05-04 DIAGNOSIS — D509 Iron deficiency anemia, unspecified: Secondary | ICD-10-CM | POA: Diagnosis present

## 2013-05-04 DIAGNOSIS — Z79899 Other long term (current) drug therapy: Secondary | ICD-10-CM | POA: Insufficient documentation

## 2013-05-04 DIAGNOSIS — F1910 Other psychoactive substance abuse, uncomplicated: Secondary | ICD-10-CM

## 2013-05-04 DIAGNOSIS — I10 Essential (primary) hypertension: Secondary | ICD-10-CM

## 2013-05-04 DIAGNOSIS — K922 Gastrointestinal hemorrhage, unspecified: Secondary | ICD-10-CM

## 2013-05-04 DIAGNOSIS — R109 Unspecified abdominal pain: Secondary | ICD-10-CM

## 2013-05-04 DIAGNOSIS — K625 Hemorrhage of anus and rectum: Principal | ICD-10-CM

## 2013-05-04 LAB — CBC
Hemoglobin: 11.4 g/dL — ABNORMAL LOW (ref 13.0–17.0)
Hemoglobin: 11.6 g/dL — ABNORMAL LOW (ref 13.0–17.0)
MCH: 30.1 pg (ref 26.0–34.0)
MCV: 90 fL (ref 78.0–100.0)
Platelets: 322 10*3/uL (ref 150–400)
Platelets: 319 10*3/uL (ref 150–400)
RBC: 3.79 MIL/uL — ABNORMAL LOW (ref 4.22–5.81)
RBC: 3.78 MIL/uL — ABNORMAL LOW (ref 4.22–5.81)
RDW: 13.4 % (ref 11.5–15.5)
WBC: 9.4 10*3/uL (ref 4.0–10.5)
WBC: 13.1 10*3/uL — ABNORMAL HIGH (ref 4.0–10.5)

## 2013-05-04 LAB — COMPREHENSIVE METABOLIC PANEL
Alkaline Phosphatase: 78 U/L (ref 39–117)
BUN: 12 mg/dL (ref 6–23)
Chloride: 106 mEq/L (ref 96–112)
Creatinine, Ser: 0.76 mg/dL (ref 0.50–1.35)
GFR calc Af Amer: >90 mL/min (ref >90)
Glucose, Bld: 163 mg/dL — ABNORMAL HIGH (ref 70–99)
Potassium: 3.8 mEq/L (ref 3.5–5.1)
Total Bilirubin: 0.2 mg/dL — ABNORMAL LOW (ref 0.3–1.2)
Total Protein: 7.2 g/dL (ref 6.0–8.3)

## 2013-05-04 LAB — CBC WITH DIFFERENTIAL/PLATELET
Eosinophils Absolute: 0.1 10*3/uL (ref 0.0–0.7)
HCT: 35.7 % — ABNORMAL LOW (ref 39.0–52.0)
Hemoglobin: 12.5 g/dL — ABNORMAL LOW (ref 13.0–17.0)
Lymphs Abs: 2.7 10*3/uL (ref 0.7–4.0)
MCH: 31.3 pg (ref 26.0–34.0)
MCV: 89.3 fL (ref 78.0–100.0)
Monocytes Absolute: 0.4 10*3/uL (ref 0.1–1.0)
Monocytes Relative: 3 % (ref 3–12)
Neutrophils Relative %: 73 % (ref 43–77)
RBC: 4 MIL/uL — ABNORMAL LOW (ref 4.22–5.81)

## 2013-05-04 LAB — TROPONIN I
Troponin I: <0.3 ng/mL (ref <0.30)
Troponin I: <0.3 ng/mL (ref <0.30)

## 2013-05-04 LAB — GLUCOSE, CAPILLARY
Glucose-Capillary: 114 mg/dL — ABNORMAL HIGH (ref 70–99)
Glucose-Capillary: 113 mg/dL — ABNORMAL HIGH (ref 70–99)

## 2013-05-04 LAB — TYPE AND SCREEN: ABO/RH(D): O POS

## 2013-05-04 LAB — OCCULT BLOOD, POC DEVICE: Fecal Occult Bld: POSITIVE — AB

## 2013-05-04 MED ORDER — SODIUM CHLORIDE 0.9 % IV SOLN: INTRAVENOUS | Status: DC

## 2013-05-04 MED ORDER — VITAMIN B-1 100 MG PO TABS
100.0000 mg | ORAL_TABLET | Freq: Every day | ORAL | Status: DC
  Administered 2013-05-04 – 2013-05-06 (×3): 100 mg via ORAL
  Filled 2013-05-04 (×3): qty 1

## 2013-05-04 MED ORDER — ADULT MULTIVITAMIN W/MINERALS CH
1.0000 | ORAL_TABLET | Freq: Every day | ORAL | Status: DC
  Administered 2013-05-04 – 2013-05-06 (×3): 1 via ORAL
  Filled 2013-05-04 (×3): qty 1

## 2013-05-04 MED ORDER — LORAZEPAM 2 MG/ML IJ SOLN
INTRAMUSCULAR | Status: AC
  Administered 2013-05-04: 2 mg
  Filled 2013-05-04: qty 1

## 2013-05-04 MED ORDER — ONDANSETRON HCL 4 MG PO TABS: 4.0000 mg | ORAL_TABLET | Freq: Four times a day (QID) | ORAL | Status: DC | PRN

## 2013-05-04 MED ORDER — ONDANSETRON HCL 4 MG/2ML IJ SOLN: 4.0000 mg | Freq: Four times a day (QID) | INTRAMUSCULAR | Status: DC | PRN

## 2013-05-04 MED ORDER — FOLIC ACID 1 MG PO TABS
1.0000 mg | ORAL_TABLET | Freq: Every day | ORAL | Status: DC
  Administered 2013-05-04 – 2013-05-06 (×3): 1 mg via ORAL
  Filled 2013-05-04 (×3): qty 1

## 2013-05-04 MED ORDER — SODIUM CHLORIDE 0.9 % IV SOLN
INTRAVENOUS | Status: DC
  Administered 2013-05-04: 13:00:00 via INTRAVENOUS

## 2013-05-04 MED ORDER — PANTOPRAZOLE SODIUM 40 MG IV SOLR
40.0000 mg | INTRAVENOUS | Status: DC
  Administered 2013-05-04: 40 mg via INTRAVENOUS
  Filled 2013-05-04 (×2): qty 40

## 2013-05-04 MED ORDER — THIAMINE HCL 100 MG/ML IJ SOLN
100.0000 mg | Freq: Every day | INTRAMUSCULAR | Status: DC
  Filled 2013-05-04 (×3): qty 1

## 2013-05-04 MED ORDER — SODIUM CHLORIDE 0.9 % IJ SOLN
3.0000 mL | Freq: Two times a day (BID) | INTRAMUSCULAR | Status: DC
  Administered 2013-05-04: 3 mL via INTRAVENOUS

## 2013-05-04 MED ORDER — SODIUM CHLORIDE 0.9 % IV BOLUS (SEPSIS)
500.0000 mL | Freq: Once | INTRAVENOUS | Status: AC
  Administered 2013-05-04: 500 mL via INTRAVENOUS

## 2013-05-04 MED ORDER — LORAZEPAM 1 MG PO TABS: 1.0000 mg | ORAL_TABLET | Freq: Four times a day (QID) | ORAL | Status: DC | PRN

## 2013-05-04 MED ORDER — LORAZEPAM 2 MG/ML IJ SOLN: 1.0000 mg | Freq: Four times a day (QID) | INTRAMUSCULAR | Status: DC | PRN

## 2013-05-04 MED ORDER — NITROGLYCERIN 0.4 MG SL SUBL: 0.4000 mg | SUBLINGUAL_TABLET | SUBLINGUAL | Status: DC | PRN

## 2013-05-04 NOTE — Progress Notes (Signed)
Re educated pt regarding NPO status, pt agitated and wants to eat and would like to leave AMA. Called MD made hime aware of what is going on. Requested him to come to speak to pt.

## 2013-05-04 NOTE — Progress Notes (Signed)
Called MD regarding NPO pt to remain NPO over the evening. No bloody stools this afternoon. Pt agitated with NPO status.

## 2013-05-04 NOTE — ED Notes (Signed)
Attempted to call report to 4E, was asked to call back in a few minutes

## 2013-05-04 NOTE — ED Notes (Signed)
Rectal exam performed and hemoccult stool card obtained by admitting md

## 2013-05-04 NOTE — ED Notes (Signed)
Admitting md at bedside to eval pt 

## 2013-05-04 NOTE — ED Notes (Signed)
Pt to ED via [facility] for evaluation of rectal bleeding that began around 11pm - bright red in color- estimated 300 cc's of blood loss per EMS.  +ETOH since yesterday, uncooperative with EMS.
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
## 2013-05-04 NOTE — Progress Notes (Signed)
MD at the bedside speaking with pt. Pt. agitated regarding being NPO.

## 2013-05-04 NOTE — ED Notes (Signed)
Upon RN assessment pt denies any complaints- pt states he is ready to go home.

## 2013-05-04 NOTE — Progress Notes (Signed)
Pt. Arrived to the unit arousable, settled in the room. Bed alarm on.

## 2013-05-05 DIAGNOSIS — R109 Unspecified abdominal pain: Secondary | ICD-10-CM

## 2013-05-05 DIAGNOSIS — K625 Hemorrhage of anus and rectum: Secondary | ICD-10-CM

## 2013-05-05 DIAGNOSIS — I10 Essential (primary) hypertension: Secondary | ICD-10-CM

## 2013-05-05 DIAGNOSIS — F1910 Other psychoactive substance abuse, uncomplicated: Secondary | ICD-10-CM

## 2013-05-05 DIAGNOSIS — F10231 Alcohol dependence with withdrawal delirium: Secondary | ICD-10-CM

## 2013-05-05 LAB — BASIC METABOLIC PANEL
Chloride: 106 mEq/L (ref 96–112)
Creatinine, Ser: 0.83 mg/dL (ref 0.50–1.35)
GFR calc Af Amer: >90 mL/min (ref >90)
GFR calc non Af Amer: 86 mL/min — ABNORMAL LOW (ref >90)
Potassium: 3.3 mEq/L — ABNORMAL LOW (ref 3.5–5.1)

## 2013-05-05 LAB — CBC
HCT: 31 % — ABNORMAL LOW (ref 39.0–52.0)
HCT: 33 % — ABNORMAL LOW (ref 39.0–52.0)
MCV: 88.8 fL (ref 78.0–100.0)
MCV: 90.2 fL (ref 78.0–100.0)
Platelets: 275 10*3/uL (ref 150–400)
Platelets: 291 10*3/uL (ref 150–400)
RBC: 3.49 MIL/uL — ABNORMAL LOW (ref 4.22–5.81)
RDW: 13.3 % (ref 11.5–15.5)
RDW: 13.2 % (ref 11.5–15.5)
WBC: 11.2 10*3/uL — ABNORMAL HIGH (ref 4.0–10.5)
WBC: 11.8 10*3/uL — ABNORMAL HIGH (ref 4.0–10.5)
WBC: 12.6 10*3/uL — ABNORMAL HIGH (ref 4.0–10.5)

## 2013-05-05 LAB — GLUCOSE, CAPILLARY
Glucose-Capillary: 119 mg/dL — ABNORMAL HIGH (ref 70–99)
Glucose-Capillary: 236 mg/dL — ABNORMAL HIGH (ref 70–99)
Glucose-Capillary: 232 mg/dL — ABNORMAL HIGH (ref 70–99)
Glucose-Capillary: 157 mg/dL — ABNORMAL HIGH (ref 70–99)

## 2013-05-05 LAB — TROPONIN I: Troponin I: <0.3 ng/mL (ref <0.30)

## 2013-05-05 MED ORDER — THIAMINE HCL 100 MG PO TABS: 100.0000 mg | ORAL_TABLET | Freq: Every day | ORAL | Status: DC

## 2013-05-05 MED ORDER — PANTOPRAZOLE SODIUM 40 MG PO TBEC
40.0000 mg | DELAYED_RELEASE_TABLET | Freq: Every day | ORAL | Status: DC
  Administered 2013-05-05 – 2013-05-06 (×2): 40 mg via ORAL
  Filled 2013-05-05 (×2): qty 1

## 2013-05-05 MED ORDER — FOLIC ACID 1 MG PO TABS: 1.0000 mg | ORAL_TABLET | Freq: Every day | ORAL | Status: DC

## 2013-05-05 NOTE — Progress Notes (Signed)
Patient will not allow IV reinsertion. Is very uncooperative at times. Was able to convince him to allow labs to be drawn. MD notified of lack of IV site an no IV fluids running.
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
## 2013-05-05 NOTE — Progress Notes (Signed)
Pt educated as to why he is NPO, pt needs stool sample to test for bld, MD aware of pt wanting to eat

## 2013-05-05 NOTE — Progress Notes (Signed)
Pt refuses to have IV MD notified, meds changed to po, pt on heart healthy diet, tolerating well, stool sample sent to lab

## 2013-05-05 NOTE — Progress Notes (Signed)
Subjective: [name] is a 72 y.o. y/o male w/ PMHx of HTN, CVA (12/2011), CAD, anxiety, and diverticulosis, and alcohol abuse was brought to the ED because of blood per rectum.  Patient is agitated this AM as he has been NPO. He has no other complaints. He denies any chest pain, SOB, nausea, vomiting, fever, chills, lightheadedness, dizziness. He had a bowel movement this AM and denies any blood or dark stools.  Objective: Vital signs in last 24 hours: Filed Vitals:   05/04/13 2139 05/05/13 0106 05/05/13 0512 05/05/13 1241  BP: 163/90 152/94 149/91 137/98  Pulse: 89 96 89 98  Temp: 98.3 F (36.8 C) 98.7 F (37.1 C) 98.2 F (36.8 C) 98.1 F (36.7 C)  TempSrc: Oral Oral Oral Oral  Resp: 18 18 16 18   Height:      Weight:   131 lb 12.8 oz (59.784 kg)   SpO2: 96% 96% 96% 97%   Weight change:   Intake/Output Summary (Last 24 hours) at 05/05/13 1348 Last data filed at 05/04/13 1723  Gross per 24 hour  Intake      0 ml  Output    350 ml  Net   -350 ml   Physical Exam: General: Alert, cooperative, and in no apparent distress HEENT: Vision grossly intact, oropharynx clear and non-erythematous  Neck: Full range of motion without pain, supple, no lymphadenopathy or carotid bruits Lungs: Clear to ascultation bilaterally, normal work of respiration, no wheezes, rales, ronchi Heart: Regular rate and rhythm, no murmurs, gallops, or rubs Abdomen: Soft, non-tender, non-distended, normal bowel sounds Extremities: No cyanosis, clubbing, or edema Neurologic: Alert & oriented X3, cranial nerves II-XII intact, strength grossly intact, sensation intact to light touch  Lab Results: Basic Metabolic Panel:  Recent Labs Lab 05/04/13 0235 05/05/13 0500  NA 140 141  K 3.8 3.3*  CL 106 106  CO2 24 24  GLUCOSE 163* 107*  BUN 12 17  CREATININE 0.76 0.83  CALCIUM 8.9 9.0   Liver Function Tests:  Recent Labs Lab 05/04/13 0235  AST 21  ALT 11  ALKPHOS 78  BILITOT 0.2*  PROT  7.2  ALBUMIN 3.3*   No results found for this basename: LIPASE, AMYLASE,  in the last 168 hours No results found for this basename: AMMONIA,  in the last 168 hours CBC:  Recent Labs Lab 05/04/13 0235  05/05/13 0500 05/05/13 1303  WBC 11.7*  < > 11.2* 11.8*  NEUTROABS 8.5*  --   --   --   HGB 12.5*  < > 10.5* 11.1*  HCT 35.7*  < > 31.4* 33.0*  MCV 89.3  < > 90.2 89.7  PLT 303  < > 291 328  < > = values in this interval not displayed. Cardiac Enzymes:  Recent Labs Lab 05/04/13 1455 05/04/13 1921 05/05/13 0015  TROPONINI <0.30 <0.30 <0.30   CBG:  Recent Labs Lab 05/04/13 1819 05/04/13 1959 05/05/13 0009 05/05/13 0418 05/05/13 0725 05/05/13 1202  GLUCAP 114* 113* 104* 119* 109* 236*   Coagulation:  Recent Labs Lab 05/04/13 0250  LABPROT 13.5  INR 1.05   Urine Drug Screen: Drugs of Abuse     Component Value Date/Time   LABOPIA NONE DETECTED 12/12/2012 1757   COCAINSCRNUR NONE DETECTED 12/12/2012 1757   LABBENZ NONE DETECTED 12/12/2012 1757   AMPHETMU NONE DETECTED 12/12/2012 1757   THCU NONE DETECTED 12/12/2012 1757   LABBARB NONE DETECTED 12/12/2012 1757    Alcohol Level:  Recent Labs Lab 05/04/13  0310 05/04/13 0835  ETH 214* 149*    Micro Results: No results found for this or any previous visit (from the past 240 hour(s)). Studies/Results: No results found. Medications: I have reviewed the patient's current medications. Scheduled Meds: . folic acid  1 mg Oral Daily  . multivitamin with minerals  1 tablet Oral Daily  . pantoprazole (PROTONIX) IV  40 mg Intravenous Q24H  . sodium chloride  3 mL Intravenous Q12H  . thiamine  100 mg Oral Daily   Or  . thiamine  100 mg Intravenous Daily   Continuous Infusions: . sodium chloride 500 mL (05/04/13 0333)  . sodium chloride 75 mL/hr at 05/04/13 1235   PRN Meds:.LORazepam, LORazepam, nitroGLYCERIN, ondansetron (ZOFRAN) IV, ondansetron Assessment/Plan:  #Bleeding Per Rectum- 71 y.o. y/o male w/ PMHx  of HTN, CVA (12/2011), CAD, anxiety, diverticulosis, and alcohol abuse, was brought to the ED because of blood per rectum. On admission (2:35 AM), Hb was 12.5 and decreased to 11.4 at 8:35 AM later in the ED.  Patient was type/screened, 2 large bore IV's placed in ED. FOBT positive. Patient has a h/o diverticulosis, seen on colonoscopy. Patient's clinical presentation was most likely d/t a diverticular bleed. BUN 12 at admission.  Today his Hb is stable at 11.8. -Patient placed on heart healthy diet. -Check next CBC in AM draw. -Monitor vitals closely  q6h  -Started on protonix 40 mg IV qd. IV came out over night and does not want another IV placed. Changed to Protonix 40 mg po.  -Fluids held for now d/t loss of PIV.  #CVA (cerebral infarction)- Patient has a h/o CVA in 2013  -Neurovascular checks q4h   #Hypertension-Patient on Amlodipine 2.5 mg qd at home.  -BP stable currently. Hold BP meds for now.   #Alcohol abuse-Patient has a history of alcohol use in the past and presented to the ED intoxicated w/ a blood alcohol level of 214. Currently, patient shows now signs of liver disease as Alk phos, AST, ALT, total protein, PTT and INR are within normal limits. Albumin slightly low at 3.3.  -Started patient on CIWA protocol; Started on multivitamin, Thiamine 100 mg qd, and give Ativan 1 mg if CIWA-AR score is >8 or w/drawal symptoms are present.   #CAD (coronary artery disease)- Patient unable to give detailed cardiac history. ECG shows NSR @ 77 bpm. 1st degree AV block, twi's in V5, V6, and LVH. No changes from previous ECG in 11/2012.  -Troponin -ve x2  -Nitroglycerin prn  #DVT Prophylaxis: SCD's  Dispo: Disposition is deferred at this time, awaiting improvement of current medical problems.  Anticipated discharge in approximately 1 day(s).   The patient does not have a current PCP (No primary provider on file.) and does not need an [facility] hospital follow-up appointment after discharge.  The  patient does not have transportation limitations that hinder transportation to clinic appointments.  .Services Needed at time of discharge: Y = Yes, Blank = No PT:   OT:   RN:   Equipment:   Other:     LOS: 1 day   Lars Masson, MD 05/05/2013, 1:48 PM Pager: [phone]

## 2013-05-05 NOTE — Discharge Summary (Signed)
Name: Calloway Andrus MRN: [id number] DOB: Mar 02, 1941 72 y.o. PCP: No primary provider on file.  Date of Admission: 05/04/2013  2:13 AM Date of Discharge: 05/07/2013 Attending Physician: No att. providers found  Discharge Diagnosis: 1. Bleeding per rectum- Most likely secondary to diverticular re-bleed. No active bleeding at discharge, Hb stabilized. 2. Alcohol abuse- Admitted w/ BAL of 214; placed on CIWA protocol in hospital, discharged w/ multivitamin and thiamine.  3. CAD- No chest pain during this admission; troponin -ve 4. Hypertension- Blood pressure stable on no medications during hospital admission. 5. CVA in 2013  Discharge Medications:   Medication List    STOP taking these medications       aspirin 81 MG chewable tablet      TAKE these medications       amLODipine 2.5 MG tablet  Commonly known as:  NORVASC  Take 2.5 mg by mouth daily.     feeding supplement Liqd  Take 237 mLs by mouth 6 (six) times daily.     folic acid 1 MG tablet  Commonly known as:  FOLVITE  Take 1 tablet (1 mg total) by mouth daily.     nitroGLYCERIN 0.4 MG SL tablet  Commonly known as:  NITROSTAT  Place 1 tablet (0.4 mg total) under the tongue every 5 (five) minutes as needed for chest pain.     omeprazole 20 MG capsule  Commonly known as:  PRILOSEC  Take 20 mg by mouth daily.     simvastatin 5 MG tablet  Commonly known as:  ZOCOR  Take 1 tablet (5 mg total) by mouth at bedtime.     thiamine 100 MG tablet  Take 1 tablet (100 mg total) by mouth daily.        Disposition and follow-up:   Mr.Fadil Teasdale was discharged from [facility] in Good condition.  At the hospital follow up visit please address:  1.  Assess for blood in stools and signs of anemia. Recheck CBC for Hb level on Friday at follow-up appointment. If stable, restart ASA 81 mg daily.  2.  Labs / imaging needed at time of follow-up: CBC  3.  Pending labs/ test needing follow-up: none  Follow-up  Appointments:     Follow-up Information   Follow up with Dow Adolph, MD On 05/09/2013. (2:45 PM)    Contact information:   [address] [place] [address] [phone]       Discharge Instructions: Discharge Orders   Future Appointments Provider Department Dept Phone   05/09/2013 2:45 PM Dow Adolph, MD [facility] [phone]   Future Orders Complete By Expires     Call MD for:  extreme fatigue  As directed     Call MD for:  persistant dizziness or light-headedness  As directed     Call MD for:  persistant nausea and vomiting  As directed     Diet - low sodium heart healthy  As directed     Increase activity slowly  As directed        Consultations:  none  Procedures Performed:  No results found.  2D Echo: 12/04/11 Study Conclusions: - Left ventricle: The cavity size was normal. Systolic function was normal. The estimated ejection fraction was in the range of 50% to 55%. Probable akinesis of the basal inferior myocardium. Left ventricular diastolic function parameters were normal. - Aortic valve: Trivial regurgitation. - Mitral valve: Moderate regurgitation directed eccentrically and toward the free wall. - Left  atrium: The atrium was mildly dilated. - Pulmonary arteries: PA peak pressure: 38mm Hg (S).  Impressions: - The right ventricular systolic pressure was increased consistent with mild pulmonary hypertension.  Admission HPI: Mr. Edge Mauger is a 72 y.o. y/o male w/ PMHx of HTN, CVA (12/2011), CAD, anxiety, and diverticulosis, and alcohol abuse was brought to the ED because of blood per rectum. He was with a friend who woke him up to take him to the hospital because she noticed he was having rectal bleeding. The patient was unable to give any history because he was intoxicated and was given ativan in the ED d/t agitation. According to his ED admission note, the patient denied any chest pain, SOB, abdominal pain, weakness or  dizziness. His blood alcohol level was 214 when admitted.  Hospital Course by problem list: Principal Problem:   GI bleeding Active Problems:   CVA (cerebral infarction)   Hypertension   Tobacco and Alcohol use   CAD (coronary artery disease)   1. Bleeding per rectum- Patient was brought to the ED by a friend who noticed that he had bled from his rectum. Patient has a history of diverticulosis and diverticular bleeding in the past. This incident was most likely a diverticular re-bleed. On admission, the patient was too intoxicated to give a history and further denied any discomfort. On arrival, his Hb was 12.5 and dropped to 11.4. He was typed and screened in the ED and 2 large bore IV's were placed. FOBT was performed and was +ve for blood. He was placed NPO, started on NS @ 125 ml/hr and protonix 40 mg IV qd. Most likely not an upper GI bleed, as patient does not report any coffee ground emesis and BUN was 12 on admission. HIs CBC's were monitored q6h and vitals monitored q6h for tachycardia and HoTN. Lactate was measured at 2.9. The next day, the patient was more coherent and denied any symptoms of dizziness, lightheadedness, LOC, emesis, and does not recall seeing blood in his stools. He was very disgruntled that he was NPO despite an explanation of possible intervention. He had no complaints of bleeding, melena, dizziness, lightheadedness, fatigue, nausea and vomiting. A normal diet was started on 05/05/13. At this time, patient removed his own IV, switched to protonix 40 mg po qd. Hb was trended from 11.4 (admission) --> 11.5 --> 11.6 --> 10.5 --> 10.5 --> 11.1 --> 9.7. On the day of discharge, he was not having any bleeding per rectum or any symptoms of possible blood loss. Discussed case w/ GI given his Hb of 9.7 and it was thought that this is not an alarming value given his absence of blood in stool or symptoms. Hb was repeated and found to be 9.9. Patient discharged at this time.  2. Alcohol  abuse/intoxication- Mr. Welliver presented to the ED w/ a BAL of 214. He was significantly intoxicated and could not provide much of a detailed history. The ED house staff was able to talk to his grandchildren to provide some history of his episode of bleeding. During his time in the ED, he was started on CIWA protocol and given a dose of Ativan by the ED staff for severe agitation. By the time the medicine team saw him, he was moderately sedated and still unable to provide much of a history. On further discussion, the patient claims he is a "weekend" drinker and does not drink alcohol every day. During his admission, he was given Ativan prn for CIWA score >8, Thiamine,  and a multivitamin. No withdrawal symptoms were present during his admission. He denied any tremors, seizures, diaphoresis, nausea, or vomiting. LFT's were ordered and showed no signs of liver disease as Alk phos, AST, ALT, total protein, PTT and INR were within normal limits. Albumin slightly low at 3.3.  3. CAD- The patient has an apparent history of CAD and given his episode of blood loss, a troponin was ordered and an ECG was performed. Troponin was -ve and ECG showed NSR w/ 1st degree AV block, LVH, and twi's in lateral leads, all unchanged from previous ECG in 11/2012. He was also given nitroglycerin prn for chest pain. The patient denied any recent history of chest pain, SOB, nausea, vomiting, or diaphoresis.   4. H/o CVA in 12/2011- Given his history of recent GI bleed, neurovascular checks were performed q4h. The patient has no residual weakness or sensory losses and reports no recent falls, LOC, vision or speech changes.  Discharge Vitals:   BP 145/71  Pulse 76  Temp(Src) 98.3 F (36.8 C) (Oral)  Resp 18  Ht 5\' 8"  (1.727 m)  Wt 136 lb (61.689 kg)  BMI 20.68 kg/m2  SpO2 97%  Discharge Labs:  No results found for this or any previous visit (from the past 24 hour(s)).  Signed: Lars Masson, MD 05/07/2013, 1:47 PM   Time Spent on  Discharge: 35 minutes Services Ordered on Discharge: none Equipment Ordered on Discharge: none

## 2013-05-06 LAB — TROPONIN I: Troponin I: <0.3 ng/mL (ref <0.30)

## 2013-05-06 LAB — BASIC METABOLIC PANEL
GFR calc Af Amer: >90 mL/min (ref >90)
GFR calc non Af Amer: 85 mL/min — ABNORMAL LOW (ref >90)
Glucose, Bld: 237 mg/dL — ABNORMAL HIGH (ref 70–99)
Potassium: 3.5 mEq/L (ref 3.5–5.1)
Sodium: 137 mEq/L (ref 135–145)

## 2013-05-06 LAB — CBC
HCT: 29.6 % — ABNORMAL LOW (ref 39.0–52.0)
Hemoglobin: 9.7 g/dL — ABNORMAL LOW (ref 13.0–17.0)
Hemoglobin: 9.9 g/dL — ABNORMAL LOW (ref 13.0–17.0)
MCH: 29.9 pg (ref 26.0–34.0)
MCHC: 33.6 g/dL (ref 30.0–36.0)
MCHC: 33.4 g/dL (ref 30.0–36.0)
MCV: 89.4 fL (ref 78.0–100.0)
RDW: 13.2 % (ref 11.5–15.5)

## 2013-05-06 NOTE — Progress Notes (Signed)
Inpatient Diabetes Program Recommendations  AACE/ADA: New Consensus Statement on Inpatient Glycemic Control (2013)  Target Ranges:  Prepandial:   less than 140 mg/dL      Peak postprandial:   less than 180 mg/dL (1-2 hours)      Critically ill patients:  140 - 180 mg/dL   Results for [name], [name] (MRN [id number]) as of 05/06/2013 14:24  Ref. Range 05/05/2013 04:18 05/05/2013 07:25 05/05/2013 12:02 05/05/2013 15:54 05/05/2013 20:08 05/05/2013 21:25 05/06/2013 05:55 05/06/2013 11:11  Glucose-Capillary Latest Range: 70-99 mg/dL 409 (H) 811 (H) 914 (H) 232 (H) 146 (H) 157 (H) 197 (H) 154 (H)   Inpatient Diabetes Program Recommendations HgbA1C: Please consider ordering an A1C to determine glycemic control over the past 2-3 months.  Note: Patient does not have a documented history of diabetes and last A1C on 09/03/2012 was 6.3%.  Initial lab glucose was 163 mg/dl on 04/08/28 at 5:62 am.  Blood glucose over the past 32 hours has ranged from 109-236 mg/dl.  Note plan to discharge patient today.  However, may want to consider ordering an A1C to determine glycemic control over the past 2-3 months.  Patient has follow up appointment scheduled with Dr. Zada Girt on 05/09/13 @ 2:45 pm in which A1C could be addressed.    Thanks, Orlando Penner, RN, MSN, CCRN Diabetes Coordinator Inpatient Diabetes Program [phone]

## 2013-05-06 NOTE — Progress Notes (Signed)
DC IV, DC Tele, DC Home. Discharge instructions and home medications discussed with patient. Patient denied any questions or concerns at this time. Patient leaving unit via wheelchair and appears in no acute distress.  

## 2013-05-06 NOTE — Progress Notes (Signed)
Pt.is A/Ox4 and had no c/o pain during the shift. He can ambulate independently. He refused IV restart during the shift and also refused IV restart from previous shift. So MD on call was called and notified and received orders that it was okay to leave IV out. Pt.this am also attempted to leave AMA and took off all telemetry leads, was able to talk with patient and was able to encourage him to stay. Telemetry was placed again on patient.

## 2013-05-06 NOTE — Progress Notes (Signed)
Subjective: [name] is a 72 y.o. y/o male w/ PMHx of HTN, CVA (12/2011), CAD, anxiety, and diverticulosis, and alcohol abuse was brought to the ED because of blood per rectum. [name] is very agitated this AM and wants to go home. He is very upset that he is still in the hospital as he denies any symptoms. No blood in his stools, no nausea, vomiting, dizziness, lightheadedness, or chest pain. This AM, his Hb was 9.7 from 11.1 yesterday. On repeat CBC, his Hb was 9.9. Discussed case w/ GI and given the absense of blood in the stool or any symptoms, the patient is ready for discharge today w/ follow-up on Friday in our clinic on Friday 05/09/13 for CBC re-check to assess Hb at that time  Objective: Vital signs in last 24 hours: Filed Vitals:   05/06/13 0135 05/06/13 0659 05/06/13 1044 05/06/13 1400  BP: 140/85 153/81 153/76 145/71  Pulse: 101 86 77 76  Temp: 98.5 F (36.9 C) 98.4 F (36.9 C)  98.3 F (36.8 C)  TempSrc: Oral Oral  Oral  Resp: 18 18  18   Height:      Weight:  136 lb (61.689 kg)    SpO2: 97% 94%  97%   Weight change: 3 lb 8.1 oz (1.589 kg)  Intake/Output Summary (Last 24 hours) at 05/06/13 1641 Last data filed at 05/06/13 0700  Gross per 24 hour  Intake    720 ml  Output      0 ml  Net    720 ml   Physical Exam: General: Alert, cooperative, and in no apparent distress HEENT: Vision grossly intact, oropharynx clear and non-erythematous  Neck: Full range of motion without pain, supple, no lymphadenopathy or carotid bruits Lungs: Clear to ascultation bilaterally, normal work of respiration, no wheezes, rales, ronchi Heart: Regular rate and rhythm, no murmurs, gallops, or rubs Abdomen: Soft, non-tender, non-distended, normal bowel sounds Extremities: No cyanosis, clubbing, or edema Neurologic: Alert & oriented X3, cranial nerves II-XII intact, strength grossly intact, sensation intact to light touch  Lab Results: Basic Metabolic Panel:  Recent Labs Lab  05/05/13 0500 05/06/13 0545  NA 141 137  K 3.3* 3.5  CL 106 102  CO2 24 25  GLUCOSE 107* 237*  BUN 17 23  CREATININE 0.83 0.84  CALCIUM 9.0 9.2   Liver Function Tests:  Recent Labs Lab 05/04/13 0235  AST 21  ALT 11  ALKPHOS 78  BILITOT 0.2*  PROT 7.2  ALBUMIN 3.3*   No results found for this basename: LIPASE, AMYLASE,  in the last 168 hours No results found for this basename: AMMONIA,  in the last 168 hours CBC:  Recent Labs Lab 05/04/13 0235  05/06/13 0545 05/06/13 0950  WBC 11.7*  < > 9.5 8.7  NEUTROABS 8.5*  --   --   --   HGB 12.5*  < > 9.7* 9.9*  HCT 35.7*  < > 28.9* 29.6*  MCV 89.3  < > 88.9 89.4  PLT 303  < > 270 279  < > = values in this interval not displayed. Cardiac Enzymes:  Recent Labs Lab 05/04/13 1921 05/05/13 0015 05/06/13 0950  TROPONINI <0.30 <0.30 <0.30   CBG:  Recent Labs Lab 05/05/13 1202 05/05/13 1554 05/05/13 2008 05/05/13 2125 05/06/13 0555 05/06/13 1111  GLUCAP 236* 232* 146* 157* 197* 154*   Coagulation:  Recent Labs Lab 05/04/13 0250  LABPROT 13.5  INR 1.05   Urine Drug Screen: Drugs of Abuse  Component Value Date/Time   LABOPIA NONE DETECTED 12/12/2012 1757   COCAINSCRNUR NONE DETECTED 12/12/2012 1757   LABBENZ NONE DETECTED 12/12/2012 1757   AMPHETMU NONE DETECTED 12/12/2012 1757   THCU NONE DETECTED 12/12/2012 1757   LABBARB NONE DETECTED 12/12/2012 1757    Alcohol Level:  Recent Labs Lab 05/04/13 0310 05/04/13 0835  ETH 214* 149*   Medications: I have reviewed the patient's current medications. Scheduled Meds: . folic acid  1 mg Oral Daily  . multivitamin with minerals  1 tablet Oral Daily  . pantoprazole  40 mg Oral Daily  . sodium chloride  3 mL Intravenous Q12H  . thiamine  100 mg Oral Daily   Or  . thiamine  100 mg Intravenous Daily   Continuous Infusions: . sodium chloride 500 mL (05/04/13 0333)  . sodium chloride 75 mL/hr at 05/04/13 1235   PRN Meds:.LORazepam, LORazepam,  nitroGLYCERIN, ondansetron (ZOFRAN) IV, ondansetron  Assessment/Plan:  #Bleeding Per Rectum- 72 y.o. y/o male w/ PMHx of HTN, CVA (12/2011), CAD, anxiety, diverticulosis, and alcohol abuse, was brought to the ED because of blood per rectum. Patient has a h/o diverticulosis, seen on colonoscopy. Patient's clinical presentation was most likely d/t a diverticular bleed.  Today his Hb decreased to 9.7 today from 11.1. No bleeding as per patient. Repeat CBC showed Hb of 9.9 -Patient to continue heart healthy diet. -Patient d/c'ed his own IV and is refusing replacement and further blood draws. Changed to Protonix 40 mg po.   #CVA (cerebral infarction)- Patient has a h/o CVA in 2013  -Neurovascular checks q4h d/c'ed at this time.  #Hypertension-Patient on Amlodipine 2.5 mg qd at home.  -BP stable currently. Hold BP meds for now, continue as outpatient.  #Alcohol abuse-Patient has a history of alcohol use in the past and presented to the ED intoxicated w/ a blood alcohol level of 214. Currently, patient shows now signs of liver disease as Alk phos, AST, ALT, total protein, PTT and INR are within normal limits. Albumin slightly low at 3.3.  -Started patient on CIWA protocol; Started on multivitamin, Thiamine 100 mg qd, and give Ativan 1 mg if CIWA-AR score is >8 or w/drawal symptoms are present.   #CAD (coronary artery disease)- Patient unable to give detailed cardiac history. ECG shows NSR @ 77 bpm. 1st degree AV block, twi's in V5, V6, and LVH. No changes from previous ECG in 11/2012.  -Troponin -ve x2  -Nitroglycerin prn  #DVT Prophylaxis: SCD's  Dispo: Disposition is deferred at this time, awaiting improvement of current medical problems.  Anticipated discharge today.  The patient does not have a current PCP (No primary provider on file.) and does need an [facility] hospital follow-up appointment after discharge.  The patient does not have transportation limitations that hinder transportation to clinic  appointments.  .Services Needed at time of discharge: Y = Yes, Blank = No PT: N  OT: N  RN: N  Equipment: N  Other: N    LOS: 2 days   Lars Masson, MD 05/06/2013, 4:41 PM Pager: [phone]

## 2013-05-06 NOTE — Progress Notes (Signed)
  Date: 05/06/2013  Patient name: [name]  Medical record number: [id number]  Date of birth: [birth year]   This patient has been seen and the plan of care was discussed with the house staff. Please see their note for complete details. I concur with their findings with the following additions/corrections:  Patient denies any further episodes of bleeding. CBC remains stable,no worsening. His stable to go home, but have given him instructions to come back if he has recurrent GI bleeding. We will follow up as an outpatient on Friday to assess that he is recovering from isolated bleeding episode which we believe was diverticular bleed. He will likely need outpatient GI follow up/colonoscopy.  Judyann Munson, MD 05/06/2013, 4:36 PM

## 2013-05-07 NOTE — Discharge Summary (Signed)
  Date: 05/07/2013  Patient name: [name]  Medical record number: [id number]  Date of birth: May 19, 1941   This patient has been seen and the plan of care was discussed with the house staff. Please see their note for complete details. I concur with their findings with the following additions/corrections:  Agree with Dr. Yetta Barre, we have given the patient recommendations and precautions to return to ED if he has further GI bleeding.  Judyann Munson, MD 05/07/2013, 3:38 PM

## 2013-05-09 ENCOUNTER — Ambulatory Visit: Payer: Medicare Other | Admitting: Internal Medicine

## 2014-06-26 ENCOUNTER — Encounter: Payer: Self-pay | Admitting: Neurology

## 2014-06-29 ENCOUNTER — Ambulatory Visit (INDEPENDENT_AMBULATORY_CARE_PROVIDER_SITE_OTHER): Payer: Medicare Other | Admitting: Neurology

## 2014-06-29 ENCOUNTER — Encounter: Payer: Self-pay | Admitting: Neurology

## 2014-06-29 VITALS — BP 126/82 | HR 95 | Temp 99.0°F | Resp 20 | Wt 130.4 lb

## 2014-06-29 DIAGNOSIS — R252 Cramp and spasm: Secondary | ICD-10-CM

## 2014-06-29 DIAGNOSIS — R259 Unspecified abnormal involuntary movements: Secondary | ICD-10-CM
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
## 2014-06-30 ENCOUNTER — Encounter: Payer: Self-pay | Admitting: Neurology

## 2014-07-01 ENCOUNTER — Encounter: Payer: Self-pay | Admitting: Neurology

## 2014-07-01 DIAGNOSIS — R252 Cramp and spasm: Secondary | ICD-10-CM | POA: Insufficient documentation

## 2014-07-23 ENCOUNTER — Telehealth: Payer: Self-pay | Admitting: Neurology

## 2014-07-23 NOTE — Telephone Encounter (Signed)
Called and left a message. I received a letter stating that patient's apartment was heat treated however he had to clean his apartment and vaccuum up the dead bed bugs. He will most likely need botox for the spasticity in his arm. I left a detailed message for Tammy and if agreed then I will have to try and get the botox approved through his insurance.  At that time when he is approved we can schedule an appointment. But we may require repeat inspection of his apartment the week before his appointment in deference to our other patients since the inspection report states a stipulation of cleaning.

## 2014-07-23 NOTE — Telephone Encounter (Signed)
Tammy from [facility] calling to schedule a follow up for patient, informed her that patient needs written documentation of the bed bug infestation being cleared before he can return, and she states that she is able to provide documentation for that. Please return call and advise.

## 2014-09-15 ENCOUNTER — Telehealth: Payer: Self-pay | Admitting: Neurology

## 2014-09-15 NOTE — Telephone Encounter (Signed)
[place] and asked for Tammy. I was directed to voice mail. I left a message stating that [name] has been approved for botox and we can provide that in early January for his spasticity. However the letter we received after his apartment was heat treatment stated he had to vaccuum up the bugs and clean in order to complete. In deference to our other patients since the inspection report states a stipulation of cleaning, we need a letter stating that he has indeed cleaned the apartment and all of the bed bugs as well as a body and skin inspection stating that there are no bed bugs or bites on his person. We will not scheduled patient until then in order to protect our other patients. Left my cell phone number.

## 2014-09-21 ENCOUNTER — Telehealth: Payer: Self-pay | Admitting: Neurology

## 2014-09-21 NOTE — Telephone Encounter (Signed)
Tried to reach patient at both numbers. Home [phone] message said he was unavailable without the ability to leave a message. His cell phone is not accepting calls at this time [phone].  Patient has been approved for botox injections for his spasticity and he is tentatively scheduled for January 11th. However the letter we received after his apartment was heat treated for bed bugs stated he had to vaccuum up the bugs and clean in order to complete. In deference to our other patients since the inspection report states a stipulation of cleaning, we need a letter stating that he has indeed cleaned the apartment of all of the bed bugs. We would also like a body and skin inspection from his pcp or a health care provider stating that there are no bed bugs or bites on his person. We will not definitively schedule patient until these have been met. We very much apologize for the inconvenience to [name] however we would like to protect our other patients.  [name] - can you try calling this patient over the next few days to see if you can reach him? I have already left a message at [facility] about this. So we just have to let patient know as well.  Thank you.

## 2014-09-23 NOTE — Telephone Encounter (Signed)
Unable to reach patient.

## 2014-10-01 ENCOUNTER — Emergency Department (HOSPITAL_COMMUNITY): Payer: Medicare Other

## 2014-10-01 ENCOUNTER — Inpatient Hospital Stay (HOSPITAL_COMMUNITY)
Admission: EM | Admit: 2014-10-01 | Discharge: 2014-10-06 | DRG: 637 | Disposition: A | Discharge status: Skilled Nursing Facility | Payer: Medicare Other | Attending: Family Medicine | Admitting: Family Medicine

## 2014-10-01 ENCOUNTER — Encounter (HOSPITAL_COMMUNITY): Payer: Self-pay | Admitting: Emergency Medicine

## 2014-10-01 DIAGNOSIS — E43 Unspecified severe protein-calorie malnutrition: Secondary | ICD-10-CM | POA: Diagnosis present

## 2014-10-01 DIAGNOSIS — G9389 Other specified disorders of brain: Secondary | ICD-10-CM | POA: Diagnosis present

## 2014-10-01 DIAGNOSIS — G92 Toxic encephalopathy: Secondary | ICD-10-CM | POA: Diagnosis present

## 2014-10-01 DIAGNOSIS — Z681 Body mass index (BMI) 19 or less, adult: Secondary | ICD-10-CM | POA: Diagnosis not present

## 2014-10-01 DIAGNOSIS — R4182 Altered mental status, unspecified: Secondary | ICD-10-CM | POA: Insufficient documentation

## 2014-10-01 DIAGNOSIS — Z8673 Personal history of transient ischemic attack (TIA), and cerebral infarction without residual deficits: Secondary | ICD-10-CM | POA: Diagnosis not present

## 2014-10-01 DIAGNOSIS — M24532 Contracture, left wrist: Secondary | ICD-10-CM | POA: Diagnosis present

## 2014-10-01 DIAGNOSIS — F419 Anxiety disorder, unspecified: Secondary | ICD-10-CM | POA: Diagnosis present

## 2014-10-01 DIAGNOSIS — F0390 Unspecified dementia without behavioral disturbance: Secondary | ICD-10-CM | POA: Diagnosis present

## 2014-10-01 DIAGNOSIS — E1165 Type 2 diabetes mellitus with hyperglycemia: Secondary | ICD-10-CM | POA: Diagnosis present

## 2014-10-01 DIAGNOSIS — E1310 Other specified diabetes mellitus with ketoacidosis without coma: Secondary | ICD-10-CM | POA: Diagnosis present

## 2014-10-01 DIAGNOSIS — M79602 Pain in left arm: Secondary | ICD-10-CM | POA: Insufficient documentation

## 2014-10-01 DIAGNOSIS — G9341 Metabolic encephalopathy: Secondary | ICD-10-CM | POA: Diagnosis present

## 2014-10-01 DIAGNOSIS — N179 Acute kidney failure, unspecified: Secondary | ICD-10-CM | POA: Diagnosis present

## 2014-10-01 DIAGNOSIS — E871 Hypo-osmolality and hyponatremia: Secondary | ICD-10-CM | POA: Diagnosis present

## 2014-10-01 DIAGNOSIS — I440 Atrioventricular block, first degree: Secondary | ICD-10-CM | POA: Diagnosis present

## 2014-10-01 DIAGNOSIS — G9340 Encephalopathy, unspecified: Secondary | ICD-10-CM | POA: Insufficient documentation

## 2014-10-01 DIAGNOSIS — E1110 Type 2 diabetes mellitus with ketoacidosis without coma: Secondary | ICD-10-CM | POA: Diagnosis present

## 2014-10-01 DIAGNOSIS — I252 Old myocardial infarction: Secondary | ICD-10-CM | POA: Diagnosis not present

## 2014-10-01 DIAGNOSIS — I2510 Atherosclerotic heart disease of native coronary artery without angina pectoris: Secondary | ICD-10-CM | POA: Diagnosis present

## 2014-10-01 DIAGNOSIS — E860 Dehydration: Secondary | ICD-10-CM | POA: Diagnosis present

## 2014-10-01 DIAGNOSIS — M25519 Pain in unspecified shoulder: Secondary | ICD-10-CM

## 2014-10-01 DIAGNOSIS — M069 Rheumatoid arthritis, unspecified: Secondary | ICD-10-CM | POA: Diagnosis present

## 2014-10-01 DIAGNOSIS — F17210 Nicotine dependence, cigarettes, uncomplicated: Secondary | ICD-10-CM | POA: Diagnosis present

## 2014-10-01 DIAGNOSIS — E876 Hypokalemia: Secondary | ICD-10-CM | POA: Diagnosis present

## 2014-10-01 DIAGNOSIS — I10 Essential (primary) hypertension: Secondary | ICD-10-CM | POA: Diagnosis present

## 2014-10-01 LAB — CBC WITH DIFFERENTIAL/PLATELET
BASOS ABS: 0 10*3/uL (ref 0.0–0.1)
BASOS PCT: 0 % (ref 0–1)
EOS PCT: 0 % (ref 0–5)
Eosinophils Absolute: 0 10*3/uL (ref 0.0–0.7)
HEMATOCRIT: 37.4 % — AB (ref 39.0–52.0)
Hemoglobin: 12.7 g/dL — ABNORMAL LOW (ref 13.0–17.0)
LYMPHS PCT: 16 % (ref 12–46)
Lymphs Abs: 1.4 10*3/uL (ref 0.7–4.0)
MCH: 31 pg (ref 26.0–34.0)
MCHC: 34 g/dL (ref 30.0–36.0)
MCV: 91.2 fL (ref 78.0–100.0)
MONO ABS: 0.7 10*3/uL (ref 0.1–1.0)
Monocytes Relative: 8 % (ref 3–12)
NEUTROS ABS: 6.5 10*3/uL (ref 1.7–7.7)
Neutrophils Relative %: 76 % (ref 43–77)
PLATELETS: 212 10*3/uL (ref 150–400)
RBC: 4.1 MIL/uL — ABNORMAL LOW (ref 4.22–5.81)
RDW: 16.4 % — AB (ref 11.5–15.5)
WBC: 8.6 10*3/uL (ref 4.0–10.5)

## 2014-10-01 LAB — URINALYSIS, ROUTINE W REFLEX MICROSCOPIC
Glucose, UA: >1000 mg/dL — AB
KETONES UR: 40 mg/dL — AB
Leukocytes, UA: NEGATIVE
NITRITE: NEGATIVE
PROTEIN: NEGATIVE mg/dL
SPECIFIC GRAVITY, URINE: 1.03 (ref 1.005–1.030)
UROBILINOGEN UA: 0.2 mg/dL (ref 0.0–1.0)
pH: 5 (ref 5.0–8.0)

## 2014-10-01 LAB — TSH: TSH: 0.601 u[IU]/mL (ref 0.350–4.500)

## 2014-10-01 LAB — MRSA PCR SCREENING: MRSA by PCR: NEGATIVE

## 2014-10-01 LAB — URINE MICROSCOPIC-ADD ON

## 2014-10-01 LAB — COMPREHENSIVE METABOLIC PANEL
ALBUMIN: 2.8 g/dL — AB (ref 3.5–5.2)
ALT: 10 U/L (ref 0–53)
AST: 12 U/L (ref 0–37)
Alkaline Phosphatase: 66 U/L (ref 39–117)
Anion gap: 17 — ABNORMAL HIGH (ref 5–15)
BUN: 21 mg/dL (ref 6–23)
CALCIUM: 8.9 mg/dL (ref 8.4–10.5)
CO2: 17 mmol/L — AB (ref 19–32)
CREATININE: 1.39 mg/dL — AB (ref 0.50–1.35)
Chloride: 96 mEq/L (ref 96–112)
GFR calc Af Amer: 56 mL/min — ABNORMAL LOW (ref >90)
GFR calc non Af Amer: 48 mL/min — ABNORMAL LOW (ref >90)
Glucose, Bld: 444 mg/dL — ABNORMAL HIGH (ref 70–99)
Potassium: 4.3 mmol/L (ref 3.5–5.1)
SODIUM: 130 mmol/L — AB (ref 135–145)
TOTAL PROTEIN: 6.2 g/dL (ref 6.0–8.3)
Total Bilirubin: 1.4 mg/dL — ABNORMAL HIGH (ref 0.3–1.2)

## 2014-10-01 LAB — CBG MONITORING, ED
GLUCOSE-CAPILLARY: 377 mg/dL — AB (ref 70–99)
Glucose-Capillary: 509 mg/dL — ABNORMAL HIGH (ref 70–99)
Glucose-Capillary: 398 mg/dL — ABNORMAL HIGH (ref 70–99)

## 2014-10-01 LAB — GLUCOSE, CAPILLARY: Glucose-Capillary: 436 mg/dL — ABNORMAL HIGH (ref 70–99)

## 2014-10-01 LAB — KETONES, QUALITATIVE

## 2014-10-01 MED ORDER — SODIUM CHLORIDE 0.9 % IV BOLUS (SEPSIS)
1000.0000 mL | Freq: Once | INTRAVENOUS | Status: AC
  Administered 2014-10-01: 1000 mL via INTRAVENOUS

## 2014-10-01 MED ORDER — SODIUM CHLORIDE 0.9 % IV SOLN
INTRAVENOUS | Status: DC
  Administered 2014-10-01: via INTRAVENOUS

## 2014-10-01 MED ORDER — HEPARIN SODIUM (PORCINE) 5000 UNIT/ML IJ SOLN
5000.0000 [IU] | Freq: Three times a day (TID) | INTRAMUSCULAR | Status: DC
  Administered 2014-10-02 – 2014-10-06 (×12): 5000 [IU] via SUBCUTANEOUS
  Filled 2014-10-01 (×18): qty 1

## 2014-10-01 MED ORDER — AMLODIPINE BESYLATE 2.5 MG PO TABS
2.5000 mg | ORAL_TABLET | Freq: Every day | ORAL | Status: DC
  Administered 2014-10-02 – 2014-10-06 (×5): 2.5 mg via ORAL
  Filled 2014-10-01 (×5): qty 1

## 2014-10-01 MED ORDER — PANTOPRAZOLE SODIUM 40 MG PO TBEC
40.0000 mg | DELAYED_RELEASE_TABLET | Freq: Every day | ORAL | Status: DC
  Administered 2014-10-02 – 2014-10-06 (×5): 40 mg via ORAL
  Filled 2014-10-01 (×5): qty 1

## 2014-10-01 MED ORDER — SODIUM CHLORIDE 0.9 % IV SOLN
1000.0000 mL | Freq: Once | INTRAVENOUS | Status: AC
  Administered 2014-10-01: 1000 mL via INTRAVENOUS

## 2014-10-01 MED ORDER — POTASSIUM CHLORIDE 10 MEQ/100ML IV SOLN
10.0000 meq | INTRAVENOUS | Status: AC
  Administered 2014-10-01 – 2014-10-02 (×2): 10 meq via INTRAVENOUS
  Filled 2014-10-01 (×2): qty 100

## 2014-10-01 MED ORDER — BACLOFEN 10 MG PO TABS
10.0000 mg | ORAL_TABLET | Freq: Two times a day (BID) | ORAL | Status: DC
  Administered 2014-10-02 – 2014-10-06 (×9): 10 mg via ORAL
  Filled 2014-10-01 (×12): qty 1

## 2014-10-01 MED ORDER — NICOTINE 14 MG/24HR TD PT24
14.0000 mg | MEDICATED_PATCH | Freq: Every day | TRANSDERMAL | Status: DC
  Administered 2014-10-02 – 2014-10-06 (×5): 14 mg via TRANSDERMAL
  Filled 2014-10-01 (×5): qty 1

## 2014-10-01 MED ORDER — DEXTROSE-NACL 5-0.45 % IV SOLN
INTRAVENOUS | Status: DC
  Administered 2014-10-02: 02:00:00 via INTRAVENOUS

## 2014-10-01 MED ORDER — VITAMIN B-1 100 MG PO TABS
100.0000 mg | ORAL_TABLET | Freq: Every day | ORAL | Status: DC
  Administered 2014-10-02: 100 mg via ORAL
  Filled 2014-10-01: qty 1

## 2014-10-01 MED ORDER — FOLIC ACID 1 MG PO TABS
1.0000 mg | ORAL_TABLET | Freq: Every day | ORAL | Status: DC
  Administered 2014-10-02: 1 mg via ORAL
  Filled 2014-10-01: qty 1

## 2014-10-01 MED ORDER — LORAZEPAM 2 MG/ML IJ SOLN
2.0000 mg | INTRAMUSCULAR | Status: DC | PRN
  Administered 2014-10-02: 2 mg via INTRAVENOUS
  Filled 2014-10-01: qty 1

## 2014-10-01 MED ORDER — SODIUM CHLORIDE 0.9 % IV SOLN
INTRAVENOUS | Status: DC
  Filled 2014-10-01: qty 2.5

## 2014-10-01 MED ORDER — ACETAMINOPHEN 325 MG PO TABS
650.0000 mg | ORAL_TABLET | Freq: Four times a day (QID) | ORAL | Status: DC | PRN
  Administered 2014-10-05: 650 mg via ORAL
  Filled 2014-10-01: qty 2

## 2014-10-01 MED ORDER — SODIUM CHLORIDE 0.9 % IV SOLN
INTRAVENOUS | Status: DC
  Administered 2014-10-01: 3.2 [IU]/h via INTRAVENOUS
  Filled 2014-10-01: qty 2.5

## 2014-10-01 MED ORDER — SIMVASTATIN 5 MG PO TABS
5.0000 mg | ORAL_TABLET | Freq: Every day | ORAL | Status: DC
  Administered 2014-10-02 – 2014-10-05 (×4): 5 mg via ORAL
  Filled 2014-10-01 (×5): qty 1
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
## 2014-10-01 NOTE — ED Notes (Signed)
Per EMS- Pt from [place]. pt family was unable to get in touch with patient, so home health nurse went over. Pt apartment found to be 99 degrees, pt was hot. Pt reports painful urination with dark urine. Pt home nurse reports diarrhea but patient denies. Pt home nurse reports pt seems "a little off." CBG 549. Given 1 L NS, pt temp noted to be 99.9 and given 1000 tylenol by EMS. BP 109/80.

## 2014-10-01 NOTE — ED Notes (Signed)
Pt has Urinal Between His Legs. Patient does not want a in/out cath, but knows we need urine.
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
## 2014-10-01 NOTE — ED Notes (Signed)
Provided patient with peri care, thick yellow curdy discharge, copious amount removed. Attempted in and out cath with Cassie A. RN and Peri Jefferson RN, met resistance. MD aware.

## 2014-10-02 ENCOUNTER — Inpatient Hospital Stay (HOSPITAL_COMMUNITY): Payer: Medicare Other

## 2014-10-02 DIAGNOSIS — R4182 Altered mental status, unspecified: Secondary | ICD-10-CM | POA: Insufficient documentation

## 2014-10-02 DIAGNOSIS — E1310 Other specified diabetes mellitus with ketoacidosis without coma: Secondary | ICD-10-CM | POA: Insufficient documentation

## 2014-10-02 DIAGNOSIS — M79602 Pain in left arm: Secondary | ICD-10-CM | POA: Insufficient documentation

## 2014-10-02 DIAGNOSIS — R410 Disorientation, unspecified: Secondary | ICD-10-CM

## 2014-10-02 LAB — CBC WITH DIFFERENTIAL/PLATELET
BASOS ABS: 0 10*3/uL (ref 0.0–0.1)
BASOS PCT: 0 % (ref 0–1)
Basophils Absolute: 0 10*3/uL (ref 0.0–0.1)
Basophils Relative: 0 % (ref 0–1)
EOS ABS: 0 10*3/uL (ref 0.0–0.7)
EOS PCT: 0 % (ref 0–5)
Eosinophils Absolute: 0 10*3/uL (ref 0.0–0.7)
Eosinophils Relative: 0 % (ref 0–5)
HCT: 34 % — ABNORMAL LOW (ref 39.0–52.0)
HCT: 39.7 % (ref 39.0–52.0)
HEMOGLOBIN: 11.8 g/dL — AB (ref 13.0–17.0)
Hemoglobin: 13.4 g/dL (ref 13.0–17.0)
LYMPHS ABS: 0.8 10*3/uL (ref 0.7–4.0)
LYMPHS PCT: 10 % — AB (ref 12–46)
Lymphocytes Relative: 10 % — ABNORMAL LOW (ref 12–46)
Lymphs Abs: 0.8 10*3/uL (ref 0.7–4.0)
MCH: 31.6 pg (ref 26.0–34.0)
MCH: 31.3 pg (ref 26.0–34.0)
MCHC: 34.7 g/dL (ref 30.0–36.0)
MCHC: 33.8 g/dL (ref 30.0–36.0)
MCV: 90.9 fL (ref 78.0–100.0)
MCV: 92.8 fL (ref 78.0–100.0)
MONO ABS: 0.7 10*3/uL (ref 0.1–1.0)
MONO ABS: 0.8 10*3/uL (ref 0.1–1.0)
MONOS PCT: 9 % (ref 3–12)
MONOS PCT: 10 % (ref 3–12)
NEUTROS ABS: 6.3 10*3/uL (ref 1.7–7.7)
NEUTROS PCT: 81 % — AB (ref 43–77)
Neutro Abs: 6 10*3/uL (ref 1.7–7.7)
Neutrophils Relative %: 80 % — ABNORMAL HIGH (ref 43–77)
PLATELETS: 217 10*3/uL (ref 150–400)
Platelets: 234 10*3/uL (ref 150–400)
RBC: 3.74 MIL/uL — ABNORMAL LOW (ref 4.22–5.81)
RBC: 4.28 MIL/uL (ref 4.22–5.81)
RDW: 16.4 % — ABNORMAL HIGH (ref 11.5–15.5)
RDW: 16.6 % — AB (ref 11.5–15.5)
WBC: 7.5 10*3/uL (ref 4.0–10.5)
WBC: 7.9 10*3/uL (ref 4.0–10.5)

## 2014-10-02 LAB — BASIC METABOLIC PANEL
ANION GAP: 6 (ref 5–15)
ANION GAP: 11 (ref 5–15)
BUN: 17 mg/dL (ref 6–23)
BUN: 15 mg/dL (ref 6–23)
CHLORIDE: 105 meq/L (ref 96–112)
CHLORIDE: 104 meq/L (ref 96–112)
CO2: 22 mmol/L (ref 19–32)
CO2: 19 mmol/L (ref 19–32)
CREATININE: 0.69 mg/dL (ref 0.50–1.35)
Calcium: 8.7 mg/dL (ref 8.4–10.5)
Calcium: 9.1 mg/dL (ref 8.4–10.5)
Creatinine, Ser: 0.77 mg/dL (ref 0.50–1.35)
GFR calc non Af Amer: 87 mL/min — ABNORMAL LOW (ref >90)
Glucose, Bld: 170 mg/dL — ABNORMAL HIGH (ref 70–99)
Glucose, Bld: 95 mg/dL (ref 70–99)
Potassium: 3.4 mmol/L — ABNORMAL LOW (ref 3.5–5.1)
Potassium: 4 mmol/L (ref 3.5–5.1)
SODIUM: 134 mmol/L — AB (ref 135–145)
Sodium: 133 mmol/L — ABNORMAL LOW (ref 135–145)

## 2014-10-02 LAB — GLUCOSE, CAPILLARY
GLUCOSE-CAPILLARY: 122 mg/dL — AB (ref 70–99)
GLUCOSE-CAPILLARY: 188 mg/dL — AB (ref 70–99)
GLUCOSE-CAPILLARY: 280 mg/dL — AB (ref 70–99)
GLUCOSE-CAPILLARY: 324 mg/dL — AB (ref 70–99)
GLUCOSE-CAPILLARY: 425 mg/dL — AB (ref 70–99)
Glucose-Capillary: 157 mg/dL — ABNORMAL HIGH (ref 70–99)
Glucose-Capillary: 87 mg/dL (ref 70–99)
Glucose-Capillary: 435 mg/dL — ABNORMAL HIGH (ref 70–99)
Glucose-Capillary: 343 mg/dL — ABNORMAL HIGH (ref 70–99)

## 2014-10-02 LAB — TROPONIN I: Troponin I: 0.03 ng/mL (ref <0.031)

## 2014-10-02 MED ORDER — KCL IN DEXTROSE-NACL 10-5-0.45 MEQ/L-%-% IV SOLN
INTRAVENOUS | Status: DC
  Administered 2014-10-02: 125 mL/h via INTRAVENOUS
  Filled 2014-10-02 (×2): qty 1000

## 2014-10-02 MED ORDER — LORAZEPAM 1 MG PO TABS: 0.0000 mg | ORAL_TABLET | Freq: Four times a day (QID) | ORAL | Status: AC

## 2014-10-02 MED ORDER — INSULIN ASPART 100 UNIT/ML ~~LOC~~ SOLN
0.0000 [IU] | Freq: Every day | SUBCUTANEOUS | Status: DC
Start: 2014-10-02 — End: 2014-10-06
  Administered 2014-10-02: 4 [IU] via SUBCUTANEOUS
  Administered 2014-10-03: 5 [IU] via SUBCUTANEOUS
  Administered 2014-10-04: 4 [IU] via SUBCUTANEOUS

## 2014-10-02 MED ORDER — HALOPERIDOL LACTATE 5 MG/ML IJ SOLN
INTRAMUSCULAR | Status: AC
Start: 2014-10-02 — End: 2014-10-02
  Filled 2014-10-02: qty 1

## 2014-10-02 MED ORDER — POTASSIUM CHLORIDE 10 MEQ/100ML IV SOLN
10.0000 meq | INTRAVENOUS | Status: AC
  Administered 2014-10-02 (×4): 10 meq via INTRAVENOUS
  Filled 2014-10-02 (×4): qty 100

## 2014-10-02 MED ORDER — LIVING WELL WITH DIABETES BOOK
Freq: Once | Status: AC
  Administered 2014-10-02: 17:00:00
  Filled 2014-10-02: qty 1

## 2014-10-02 MED ORDER — DEXTROSE-NACL 5-0.45 % IV SOLN
INTRAVENOUS | Status: DC
  Administered 2014-10-02: 125 mL/h via INTRAVENOUS

## 2014-10-02 MED ORDER — HALOPERIDOL LACTATE 5 MG/ML IJ SOLN
2.0000 mg | Freq: Once | INTRAMUSCULAR | Status: AC
  Administered 2014-10-02: 2 mg via INTRAVENOUS

## 2014-10-02 MED ORDER — FOLIC ACID 1 MG PO TABS
1.0000 mg | ORAL_TABLET | Freq: Every day | ORAL | Status: DC
  Administered 2014-10-03 – 2014-10-06 (×4): 1 mg via ORAL
  Filled 2014-10-02 (×4): qty 1

## 2014-10-02 MED ORDER — THIAMINE HCL 100 MG/ML IJ SOLN
100.0000 mg | Freq: Every day | INTRAMUSCULAR | Status: DC
  Filled 2014-10-02 (×4): qty 1

## 2014-10-02 MED ORDER — ADULT MULTIVITAMIN W/MINERALS CH
1.0000 | ORAL_TABLET | Freq: Every day | ORAL | Status: DC
  Administered 2014-10-03 – 2014-10-06 (×4): 1 via ORAL
  Filled 2014-10-02 (×4): qty 1

## 2014-10-02 MED ORDER — INSULIN ASPART 100 UNIT/ML ~~LOC~~ SOLN
0.0000 [IU] | Freq: Three times a day (TID) | SUBCUTANEOUS | Status: DC
  Administered 2014-10-02 – 2014-10-03 (×4): 9 [IU] via SUBCUTANEOUS
  Administered 2014-10-04: 5 [IU] via SUBCUTANEOUS
  Administered 2014-10-04: 9 [IU] via SUBCUTANEOUS

## 2014-10-02 MED ORDER — VITAMIN B-1 100 MG PO TABS
100.0000 mg | ORAL_TABLET | Freq: Every day | ORAL | Status: DC
  Administered 2014-10-03 – 2014-10-06 (×4): 100 mg via ORAL
  Filled 2014-10-02 (×4): qty 1

## 2014-10-02 MED ORDER — LORAZEPAM 1 MG PO TABS: 1.0000 mg | ORAL_TABLET | Freq: Four times a day (QID) | ORAL | Status: DC | PRN

## 2014-10-02 MED ORDER — LORAZEPAM 1 MG PO TABS
0.0000 mg | ORAL_TABLET | Freq: Two times a day (BID) | ORAL | Status: DC
  Filled 2014-10-02: qty 1

## 2014-10-02 MED ORDER — GLIPIZIDE 5 MG PO TABS
5.0000 mg | ORAL_TABLET | Freq: Every day | ORAL | Status: DC
  Filled 2014-10-02: qty 1

## 2014-10-02 MED ORDER — LORAZEPAM 2 MG/ML IJ SOLN: 1.0000 mg | Freq: Four times a day (QID) | INTRAMUSCULAR | Status: DC | PRN

## 2014-10-02 MED ORDER — METFORMIN HCL 500 MG PO TABS
500.0000 mg | ORAL_TABLET | Freq: Two times a day (BID) | ORAL | Status: DC
  Administered 2014-10-03: 500 mg via ORAL
  Filled 2014-10-02 (×3): qty 1

## 2014-10-02 MED ORDER — SODIUM CHLORIDE 0.45 % IV SOLN
INTRAVENOUS | Status: DC
  Administered 2014-10-02 – 2014-10-04 (×3): via INTRAVENOUS

## 2014-10-02 NOTE — Progress Notes (Signed)
Pt  Finally let lab person draw blood. At that time CBG 157 - left Insulin gtt at 3.8 unit infusion. Is irritable over the fact that he cannot eat. Given sips of water. Called pt's daughter per his request so he could talk to her in the room  and it seemed to settle him down a little.
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
## 2014-10-02 NOTE — Progress Notes (Signed)
Text page to MD made aware of CBG of 425. MD says he will place orders for Sliding scale

## 2014-10-02 NOTE — Progress Notes (Signed)
Family Medicine Teaching Service Interim Progress Note  Vincent Mavis. is a 74 y.o. male admitted for DKA.  Called by RN as patient refusing all lab draws.  No BMET since 5pm. Last CBG [phone] 280 but currently refusing all CBGs as well.  While trying to calmly discuss situation with patient, he became very agitated, yelling at MD and RN.  Then became combative, swatting at RN while trying to connect IV line.  A/P: Likely acute delirium in setting of DKA and likely some underlying cognitive impairment. - Haldol IV 2mg  x1 for agitation, as it is unsafe for patient to be swatting at nursing staff.  Lavon Paganini, MD, MPH PGY-1,  [facility] 10/02/2014 1:49 AM

## 2014-10-02 NOTE — Progress Notes (Signed)
Report called to 6N for pt.

## 2014-10-02 NOTE — Progress Notes (Signed)
Pt taken to x-ray per bed accompanied by RN - to have Lt arm xray that was ordered previous night.

## 2014-10-02 NOTE — Progress Notes (Signed)
Utilization Review Completed.  

## 2014-10-02 NOTE — Evaluation (Signed)
Physical Therapy Evaluation Patient Details Name: [name]. MRN: [id number] DOB: [birth year] Today's Date: 10/02/2014   History of Present Illness  pt presents with DKA and Encephalopathy.    Clinical Impression  Pt needs encouragement for participation, but does agree to come to standing to don boxers and requires A for all aspects of mobility.  Unclear pt's baseline level of mobility or cognition, but at this time pt would benefit from SNF level of care at D/C.  Will continue to follow.      Follow Up Recommendations SNF    Equipment Recommendations  None recommended by PT    Recommendations for Other Services       Precautions / Restrictions Precautions Precautions: Fall Restrictions Weight Bearing Restrictions: No      Mobility  Bed Mobility Overal bed mobility: Needs Assistance Bed Mobility: Supine to Sit;Sit to Supine     Supine to sit: Min assist;HOB elevated Sit to supine: Min assist;HOB elevated   General bed mobility comments: A with bringing trunk up to sitting and with returning LEs back to bed.    Transfers Overall transfer level: Needs assistance Equipment used: None Transfers: Sit to/from Stand Sit to Stand: Min assist         General transfer comment: cues for safe technique and attending to task.  pt unsteady and leans LEs against bed for support and needs MinA to maintain balance.    Ambulation/Gait                Stairs            Wheelchair Mobility    Modified Rankin (Stroke Patients Only)       Balance Overall balance assessment: Needs assistance Sitting-balance support: No upper extremity supported;Feet supported Sitting balance-Leahy Scale: Fair     Standing balance support: No upper extremity supported;During functional activity Standing balance-Leahy Scale: Poor Standing balance comment: Requires MinA.                               Pertinent Vitals/Pain Pain Assessment: No/denies pain     Home Living Family/patient expects to be discharged to:: Skilled nursing facility                      Prior Function           Comments: Unclear as no family present and pt with cognitive difficulties.       Hand Dominance        Extremity/Trunk Assessment   Upper Extremity Assessment: Defer to OT evaluation           Lower Extremity Assessment: Generalized weakness      Cervical / Trunk Assessment: Kyphotic  Communication   Communication: No difficulties  Cognition Arousal/Alertness: Awake/alert Behavior During Therapy: WFL for tasks assessed/performed Overall Cognitive Status: Impaired/Different from baseline Area of Impairment: Orientation;Attention;Memory;Following commands;Safety/judgement;Awareness;Problem solving Orientation Level: Disoriented to;Situation Current Attention Level: Selective Memory: Decreased short-term memory Following Commands: Follows one step commands with increased time;Follows multi-step commands inconsistently Safety/Judgement: Decreased awareness of safety;Decreased awareness of deficits Awareness: Emergent Problem Solving: Slow processing;Difficulty sequencing;Requires verbal cues;Requires tactile cues General Comments: No family present to determine pt's baseline level of cognition.      General Comments      Exercises        Assessment/Plan    PT Assessment Patient needs continued PT services  PT Diagnosis Difficulty walking;Generalized weakness   PT Problem  List Decreased strength;Decreased activity tolerance;Decreased balance;Decreased mobility;Decreased cognition;Decreased knowledge of use of DME;Decreased safety awareness  PT Treatment Interventions DME instruction;Gait training;Functional mobility training;Therapeutic activities;Therapeutic exercise;Balance training;Cognitive remediation;Patient/family education   PT Goals (Current goals can be found in the Care Plan section) Acute Rehab PT  Goals Patient Stated Goal: To find his cell phone.   PT Goal Formulation: With patient Time For Goal Achievement: 10/16/14 Potential to Achieve Goals: Good    Frequency Min 2X/week   Barriers to discharge        Co-evaluation               End of Session   Activity Tolerance: Patient limited by fatigue Patient left: in bed;with call bell/phone within reach;with bed alarm set Nurse Communication: Mobility status         Time: 1435-1501 PT Time Calculation (min) (ACUTE ONLY): 26 min   Charges:   PT Evaluation $Initial PT Evaluation Tier I: 1 Procedure PT Treatments $Therapeutic Activity: 8-22 mins   PT G CodesCatarina Hartshorn, [place] [phone] 10/02/2014, 3:21 PM

## 2014-10-02 NOTE — Progress Notes (Signed)
CBG 87 - stopped Insulin gtt

## 2014-10-02 NOTE — Progress Notes (Signed)
DR Teryl Lucy here examining pt. Pt finally agreed to let lab draw blood and do CBG. Pt had been refusing hourly since 0200 as relayed per night shift RN. Since Insulin gtt was still infusing at 3.8 u/hr MD's had added D5.45 at 125 cc/hr.

## 2014-10-02 NOTE — Progress Notes (Signed)
Pt. Refused to have labs drawn. MD made aware.

## 2014-10-02 NOTE — Progress Notes (Signed)
Notified Allean Found that pt is refusing CBG again. Last known CBG was 188 at 0208. MD wants to keep pt on insulin drip and will attempt labs in the morning.

## 2014-10-02 NOTE — Progress Notes (Addendum)
Inpatient Diabetes Program Recommendations  AACE/ADA: New Consensus Statement on Inpatient Glycemic Control (2013)  Target Ranges:  Prepandial:   less than 140 mg/dL      Peak postprandial:   less than 180 mg/dL (1-2 hours)      Critically ill patients:  140 - 180 mg/dL   Reason for Visit: Diabetes Consult  Diabetes history: No documented hx DM. Outpatient Diabetes medications: None Current orders for Inpatient glycemic control: IV insulin per GlucoStabilizer  74 y.o. male presenting with new onset DKA. PMH is significant for pre-diabetes, CAD, HTN, h/o CVA, H/o Alcohol and Tobacco abuse, severe protein-calorie malnutrition, anxiety. On GlucoStabilizer per DKA protocol. Will transition to SQ when criteria met for discontinuation of insulin drip. Results for SHEA, KAPUR (MRN [id number]) as of 10/02/2014 10:52  Ref. Range 10/01/2014 17:06 10/02/2014 08:35  Sodium Latest Range: 135-145 mmol/L 130 (L) 133 (L)  Potassium Latest Range: 3.5-5.1 mmol/L 4.3 3.4 (L)  Chloride Latest Range: 96-112 mEq/L 96 105  CO2 Latest Range: 19-32 mmol/L 17 (L) 22  BUN Latest Range: 6-23 mg/dL 21 17  Creatinine Latest Range: 0.50-1.35 mg/dL 1.39 (H) 0.77  Calcium Latest Range: 8.4-10.5 mg/dL 8.9 8.7  GFR calc non Af Amer Latest Range: >90 mL/min 48 (L) 87 (L)  GFR calc Af Amer Latest Range: >90 mL/min 56 (L) >90  Glucose Latest Range: 70-99 mg/dL 444 (H) 170 (H)  Anion gap Latest Range: 5-15  17 (H) 6   Results for MAKALE, PINDELL (MRN [id number]) as of 10/02/2014 10:52  Ref. Range 10/01/2014 22:01 10/01/2014 23:09 10/02/2014 00:14 10/02/2014 02:08 10/02/2014 08:36  Glucose-Capillary Latest Range: 70-99 mg/dL 436 (H) 324 (H) 280 (H) 188 (H) 157 (H)   DKA in the setting of newly-diagnosed T2DM. Will order Living Well With Diabetes book Will need PCP to manage DM - care management consult to assist with possibility of [place]. Pt appears not able to give himself insulin or check blood sugars at home. Pt refusing  to have CBGs checked per RN.  Will await results of HgbA1C. When transitioning to SQ insulin, add Novolog sensitive Q4H and when diet advanced, tidwc and hs. Recommendation: Pt unwilling and unable to go home on insulin. Will not check blood sugars at home. May benefit from metformin or tradjenta.   Thank you. Lorenda Peck, RD, LDN, CDE Inpatient Diabetes Coordinator [phone]

## 2014-10-03 DIAGNOSIS — G9340 Encephalopathy, unspecified: Secondary | ICD-10-CM | POA: Insufficient documentation

## 2014-10-03 DIAGNOSIS — E1110 Type 2 diabetes mellitus with ketoacidosis without coma: Secondary | ICD-10-CM | POA: Insufficient documentation

## 2014-10-03 DIAGNOSIS — I10 Essential (primary) hypertension: Secondary | ICD-10-CM | POA: Insufficient documentation

## 2014-10-03 LAB — GLUCOSE, CAPILLARY
GLUCOSE-CAPILLARY: 355 mg/dL — AB (ref 70–99)
GLUCOSE-CAPILLARY: 386 mg/dL — AB (ref 70–99)
Glucose-Capillary: 387 mg/dL — ABNORMAL HIGH (ref 70–99)
Glucose-Capillary: 452 mg/dL — ABNORMAL HIGH (ref 70–99)

## 2014-10-03 LAB — BASIC METABOLIC PANEL
Anion gap: 12 (ref 5–15)
BUN: 13 mg/dL (ref 6–23)
CO2: 17 mmol/L — AB (ref 19–32)
Calcium: 8.5 mg/dL (ref 8.4–10.5)
Chloride: 103 mEq/L (ref 96–112)
Creatinine, Ser: 0.76 mg/dL (ref 0.50–1.35)
GFR calc Af Amer: >90 mL/min (ref >90)
GFR calc non Af Amer: 88 mL/min — ABNORMAL LOW (ref >90)
Glucose, Bld: 302 mg/dL — ABNORMAL HIGH (ref 70–99)
Potassium: 3.8 mmol/L (ref 3.5–5.1)
Sodium: 132 mmol/L — ABNORMAL LOW (ref 135–145)

## 2014-10-03 LAB — TROPONIN I: Troponin I: <0.03 ng/mL (ref <0.031)

## 2014-10-03 MED ORDER — METFORMIN HCL 500 MG PO TABS: 1000.0000 mg | ORAL_TABLET | Freq: Two times a day (BID) | ORAL | Status: DC

## 2014-10-03 MED ORDER — METFORMIN HCL 500 MG PO TABS: 500.0000 mg | ORAL_TABLET | Freq: Once | ORAL | Status: DC

## 2014-10-03 MED ORDER — GLUCERNA SHAKE PO LIQD
237.0000 mL | Freq: Three times a day (TID) | ORAL | Status: DC
  Administered 2014-10-03 – 2014-10-06 (×7): 237 mL via ORAL
  Filled 2014-10-03 (×3): qty 237

## 2014-10-03 MED ORDER — METFORMIN HCL 500 MG PO TABS
500.0000 mg | ORAL_TABLET | Freq: Two times a day (BID) | ORAL | Status: DC
  Administered 2014-10-03 – 2014-10-06 (×6): 500 mg via ORAL
  Filled 2014-10-03 (×7): qty 1

## 2014-10-03 MED ORDER — ASPIRIN EC 81 MG PO TBEC
81.0000 mg | DELAYED_RELEASE_TABLET | Freq: Every day | ORAL | Status: DC
  Administered 2014-10-03 – 2014-10-06 (×4): 81 mg via ORAL
  Filled 2014-10-03 (×4): qty 1
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
## 2014-10-04 ENCOUNTER — Inpatient Hospital Stay (HOSPITAL_COMMUNITY): Payer: Medicare Other

## 2014-10-04 DIAGNOSIS — E1310 Other specified diabetes mellitus with ketoacidosis without coma: Principal | ICD-10-CM

## 2014-10-04 DIAGNOSIS — I10 Essential (primary) hypertension: Secondary | ICD-10-CM

## 2014-10-04 LAB — GLUCOSE, CAPILLARY
GLUCOSE-CAPILLARY: 301 mg/dL — AB (ref 70–99)
GLUCOSE-CAPILLARY: 297 mg/dL — AB (ref 70–99)
Glucose-Capillary: 362 mg/dL — ABNORMAL HIGH (ref 70–99)
Glucose-Capillary: 365 mg/dL — ABNORMAL HIGH (ref 70–99)
Glucose-Capillary: 311 mg/dL — ABNORMAL HIGH (ref 70–99)

## 2014-10-04 LAB — BASIC METABOLIC PANEL
Anion gap: 3 — ABNORMAL LOW (ref 5–15)
BUN: 9 mg/dL (ref 6–23)
CO2: 24 mmol/L (ref 19–32)
Calcium: 8 mg/dL — ABNORMAL LOW (ref 8.4–10.5)
Chloride: 101 mEq/L (ref 96–112)
Creatinine, Ser: 0.65 mg/dL (ref 0.50–1.35)
GFR calc Af Amer: >90 mL/min (ref >90)
GFR calc non Af Amer: >90 mL/min (ref >90)
Glucose, Bld: 399 mg/dL — ABNORMAL HIGH (ref 70–99)
Potassium: 3.8 mmol/L (ref 3.5–5.1)
SODIUM: 128 mmol/L — AB (ref 135–145)

## 2014-10-04 LAB — HEMOGLOBIN A1C
HEMOGLOBIN A1C: 19.1 % — AB (ref <5.7)
Mean Plasma Glucose: 501 mg/dL — ABNORMAL HIGH (ref <117)

## 2014-10-04 LAB — URINE CULTURE
COLONY COUNT: NO GROWTH
CULTURE: NO GROWTH

## 2014-10-04 MED ORDER — INSULIN ASPART 100 UNIT/ML ~~LOC~~ SOLN
0.0000 [IU] | Freq: Three times a day (TID) | SUBCUTANEOUS | Status: DC
  Administered 2014-10-04: 15 [IU] via SUBCUTANEOUS
  Administered 2014-10-05: 11 [IU] via SUBCUTANEOUS
  Administered 2014-10-05: 8 [IU] via SUBCUTANEOUS
  Administered 2014-10-06 (×2): 11 [IU] via SUBCUTANEOUS

## 2014-10-04 MED ORDER — INSULIN ASPART 100 UNIT/ML ~~LOC~~ SOLN: 0.0000 [IU] | Freq: Every day | SUBCUTANEOUS | Status: DC
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
## 2014-10-05 DIAGNOSIS — G9340 Encephalopathy, unspecified: Secondary | ICD-10-CM

## 2014-10-05 LAB — GLUCOSE, CAPILLARY
GLUCOSE-CAPILLARY: 328 mg/dL — AB (ref 70–99)
Glucose-Capillary: 291 mg/dL — ABNORMAL HIGH (ref 70–99)
Glucose-Capillary: 442 mg/dL — ABNORMAL HIGH (ref 70–99)
Glucose-Capillary: 204 mg/dL — ABNORMAL HIGH (ref 70–99)

## 2014-10-05 MED ORDER — INSULIN ASPART 100 UNIT/ML ~~LOC~~ SOLN
15.0000 [IU] | Freq: Once | SUBCUTANEOUS | Status: AC
  Administered 2014-10-05: 15 [IU] via SUBCUTANEOUS

## 2014-10-05 MED ORDER — LORAZEPAM 1 MG PO TABS: 1.0000 mg | ORAL_TABLET | Freq: Four times a day (QID) | ORAL | Status: DC | PRN

## 2014-10-05 MED ORDER — INSULIN GLARGINE 100 UNIT/ML ~~LOC~~ SOLN
10.0000 [IU] | Freq: Every day | SUBCUTANEOUS | Status: DC
  Administered 2014-10-05 – 2014-10-06 (×2): 10 [IU] via SUBCUTANEOUS
  Filled 2014-10-05 (×3): qty 0.1

## 2014-10-05 MED ORDER — LORAZEPAM 2 MG/ML IJ SOLN: 1.0000 mg | Freq: Four times a day (QID) | INTRAMUSCULAR | Status: DC | PRN

## 2014-10-05 NOTE — Care Management Note (Signed)
  Page 1 of 1   10/06/2014     10:11:09 AM CARE MANAGEMENT NOTE 10/06/2014  Patient:  [name], [name]   Account Number:  [id number]  Date Initiated:  10/05/2014  Documentation initiated by:  Magdalen Spatz  Subjective/Objective Assessment:     Action/Plan:   Anticipated DC Date:  10/06/2014   Anticipated DC Plan:  SKILLED NURSING FACILITY  In-house referral  Clinical Social Worker         Choice offered to / List presented to:             Status of service:   Medicare Important Message given?  YES (If response is "NO", the following Medicare IM given date fields will be blank) Date Medicare IM given:  10/05/2014 Medicare IM given by:  Magdalen Spatz Date Additional Medicare IM given:  10/06/2014 Additional Medicare IM given by:  Magdalen Spatz  Discharge Disposition:    Per UR Regulation:  Reviewed for med. necessity/level of care/duration of stay  If discussed at [place] of Stay Meetings, dates discussed:   10/06/2014    Comments:

## 2014-10-05 NOTE — Progress Notes (Signed)
Patient recently started being followed by [place] Management services. [place] had been in contact with Admissions Director at [facility] [place] about SNF placement for him prior to admission. [place] reports patient is not safe at home alone. Will continue to follow.Will alert make inpatient RNCM aware as well.  Marthenia Rolling, MSN- [facility] [phone]

## 2014-10-05 NOTE — Progress Notes (Signed)
OT Cancellation Note  Patient Details Name: [name]. MRN: [id number] DOB: [birth year]   Cancelled Treatment:    Reason Eval/Treat Not Completed: OT screened.   Pt is Medicare and current D/C plan is SNF (spoke with nurse and thinks pt will leave today or tomorrow). No apparent immediate acute care OT needs, therefore will defer OT to SNF. If OT eval is needed please call Acute Rehab Dept. at [phone] or text page OT at [phone].    Benito Mccreedy OTR/L [phone] 10/05/2014, 10:13 AM

## 2014-10-05 NOTE — Clinical Social Work Note (Signed)
CSW received referral for possible SNF placement at time of discharge.  Per chart review, pt currently active with [facility] and prior to admission was to be admitted to [facility].  CSW contacted by [place] regarding information above.  Per [facility] admissions liaison, facility is able to admit pt once pt is medically stable for discharge.  CSW attempted to speak with pt's daughter, [name] [phone]), regarding discharge disposition.  CSW left message with pt's daughter and will follow-up on 10/06/2014.  [name], [place] [phone]) Licensed Clinical Social Worker Orthopedics [phone]) and Surgical [phone])

## 2014-10-05 NOTE — Progress Notes (Signed)
Patient's lunchtime CBG was 442. Asymptomatic.  MD notified and new orders received.
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
## 2014-10-06 LAB — BASIC METABOLIC PANEL
Anion gap: 7 (ref 5–15)
BUN: 10 mg/dL (ref 6–23)
CALCIUM: 8.7 mg/dL (ref 8.4–10.5)
CO2: 26 mmol/L (ref 19–32)
Chloride: 100 mEq/L (ref 96–112)
Creatinine, Ser: 0.63 mg/dL (ref 0.50–1.35)
GFR calc Af Amer: >90 mL/min (ref >90)
GLUCOSE: 272 mg/dL — AB (ref 70–99)
POTASSIUM: 3.8 mmol/L (ref 3.5–5.1)
SODIUM: 133 mmol/L — AB (ref 135–145)

## 2014-10-06 LAB — GLUCOSE, CAPILLARY
GLUCOSE-CAPILLARY: 305 mg/dL — AB (ref 70–99)
Glucose-Capillary: 304 mg/dL — ABNORMAL HIGH (ref 70–99)

## 2014-10-06 LAB — VITAMIN B12: Vitamin B-12: 1093 pg/mL — ABNORMAL HIGH (ref 211–911)

## 2014-10-06 LAB — HIV ANTIBODY (ROUTINE TESTING W REFLEX): HIV 1&2 Ab, 4th Generation: NONREACTIVE

## 2014-10-06 LAB — RPR

## 2014-10-06 MED ORDER — INSULIN GLARGINE 100 UNIT/ML ~~LOC~~ SOLN: 16.0000 [IU] | Freq: Every day | SUBCUTANEOUS | Status: DC

## 2014-10-06 MED ORDER — INSULIN GLARGINE 100 UNIT/ML ~~LOC~~ SOLN: 6.0000 [IU] | Freq: Once | SUBCUTANEOUS | Status: DC

## 2014-10-06 MED ORDER — INSULIN GLARGINE 100 UNIT/ML ~~LOC~~ SOLN
6.0000 [IU] | Freq: Once | SUBCUTANEOUS | Status: AC
  Administered 2014-10-06: 6 [IU] via SUBCUTANEOUS
  Filled 2014-10-06 (×2): qty 0.06

## 2014-10-06 MED ORDER — METFORMIN HCL 500 MG PO TABS: 500.0000 mg | ORAL_TABLET | Freq: Two times a day (BID) | ORAL | Status: DC

## 2014-10-06 MED ORDER — ASPIRIN 81 MG PO TBEC
81.0000 mg | DELAYED_RELEASE_TABLET | Freq: Every day | ORAL | Status: DC
Start: 2014-10-06 — End: 2016-04-29

## 2014-10-06 NOTE — Progress Notes (Signed)
Pt discharged via [facility] with all belongings. Graceann Congress

## 2014-10-06 NOTE — Clinical Social Work Placement (Addendum)
Clinical Social Work Department CLINICAL SOCIAL WORK PLACEMENT NOTE 10/06/2014  Patient:  [name], [name]  Account Number:  [id number] Admit date:  10/01/2014  Clinical Social Worker:  Delrae Sawyers  Date/time:  10/06/2014 11:19 AM  Clinical Social Work is seeking post-discharge placement for this patient at the following level of care:   SKILLED NURSING   (*CSW will update this form in Epic as items are completed)   10/06/2014  Patient/family provided with [facility] Department of Clinical Social Work's list of facilities offering this level of care within the geographic area requested by the patient (or if unable, by the patient's family).  10/06/2014  Patient/family informed of their freedom to choose among providers that offer the needed level of care, that participate in Medicare, Medicaid or managed care program needed by the patient, have an available bed and are willing to accept the patient.  10/06/2014  Patient/family informed of [facility]' ownership interest in [facility], as well as of the fact that they are under no obligation to receive care at this facility.  PASARR submitted to EDS on 10/06/2014 PASARR number received on 10/06/2014  FL2 transmitted to all facilities in geographic area requested by pt/family on  10/06/2014 FL2 transmitted to all facilities within larger geographic area on   Patient informed that his/her managed care company has contracts with or will negotiate with  certain facilities, including the following:     Patient/family informed of bed offers received:  10/06/2014 Patient chooses bed at [facility] [place] Physician recommends and patient chooses bed at    Patient to be transferred to [facility] on  10/06/2014 Patient to be transferred to facility by PTAR Patient and family notified of transfer on 10/06/2014 Name of family member notified:  Pt's daughter, [name], updated regarding  discharge.  The following physician request were entered in Epic:   Additional Comments:  [facility] ([phone]) Licensed Clinical Social Worker Orthopedics [phone]) and Surgical [phone])

## 2014-10-06 NOTE — Clinical Social Work Note (Signed)
Pt to be discharged to [facility]. Pt's daughter, [name], updated regarding discharge.  Facility: [place] Report number: [phone] Transportation: EMS ([address]  [name], [place] ([phone]) Licensed Clinical Social Worker Orthopedics [phone]) and Surgical [phone])

## 2014-10-06 NOTE — Progress Notes (Signed)
Came to see patient prior to his discharge to SNF. He is new to [place] Management. Made him aware that [facility] Coordinator to follow up post discharge. Consents signed at bedside. Left [facility] Care Management packet with patient. Marthenia Rolling, MSN- RN, [facility] [phone]

## 2014-10-06 NOTE — Progress Notes (Signed)
Report called to Geologist, engineering at [facility].  All questions answered and [name] instructed to call back if she think of any questions.  No IV acces.  VSS. Pt in no s/s of distress. PTAR called by CSW. Graceann Congress

## 2014-10-06 NOTE — Clinical Social Work Psychosocial (Signed)
Clinical Social Work Department BRIEF PSYCHOSOCIAL ASSESSMENT 10/06/2014  Patient:  [name], [name]     Account Number:  [id number]     Admit date:  10/01/2014  Clinical Social Worker:  Delrae Sawyers  Date/Time:  10/06/2014 11:13 AM  Referred by:  Physician  Date Referred:  10/06/2014 Referred for  SNF Placement   Other Referral:   none.   Interview type:  Family Other interview type:   CSW spoke with pt's daughter, [name] [phone]).    PSYCHOSOCIAL DATA Living Status:  ALONE Admitted from facility:   Level of care:   Primary support name:  [name] Primary support relationship to patient:  CHILD, ADULT Degree of support available:   Strong support system.    CURRENT CONCERNS Current Concerns  Post-Acute Placement   Other Concerns:   none.    SOCIAL WORK ASSESSMENT / PLAN CSW received referral for possible SNF placement at time of discharge. CSW spoke with pt's daughter regarding discharge disposition. Pt's daughter confirmed that prior to admission to [name], pt was to be admitted to [name].    CSW to continue to follow and assist with discharge planning needs.   Assessment/plan status:  Psychosocial Support/Ongoing Assessment of Needs Other assessment/ plan:   none.   Information/referral to community resources:   Pt to be discharged to [name].    PATIENT'S/FAMILY'S RESPONSE TO PLAN OF CARE: Pt's daughter understanding and agreeable to CSW plan of care. Pt's daughter expressed no further questions or concerns.       [name], [name] ([phone]) Licensed Clinical Social Worker Orthopedics [phone]) and Surgical [phone])

## 2014-10-06 NOTE — Progress Notes (Signed)
Physical Therapy Treatment Patient Details Name: [name]. MRN: [id number] DOB: 1941-06-23 Today's Date: 10/06/2014    History of Present Illness pt presents with DKA and Encephalopathy.      PT Comments    Patient agreeable to ambulation today and able to walk in hallway with one seated rest break. Patient with some short term memory loss during session but otherwise appropriate with therapist assistance and tech. Patient stated that he was ready to go back home but was going to [place] instead. Educated patient on safety and progressing with therapy before returning to be able to live alone.   Follow Up Recommendations  SNF     Equipment Recommendations  None recommended by PT    Recommendations for Other Services       Precautions / Restrictions Precautions Precautions: Fall    Mobility  Bed Mobility               General bed mobility comments: up in recliner upon arrival  Transfers Overall transfer level: Needs assistance Equipment used: None   Sit to Stand: Min assist         General transfer comment: Cues for safe hand placement to push up and cues to turn all the way around prior to sitting  Ambulation/Gait Ambulation/Gait assistance: Min assist;+2 safety/equipment Ambulation Distance (Feet): 100 Feet (x2) Assistive device: 1 person hand held assist Gait Pattern/deviations: Decreased stride length;Narrow base of support   Gait velocity interpretation: Below normal speed for age/gender General Gait Details: Patient somewhat guarded initially with some staggering side to side but improved with distance of comfort.    Stairs            Wheelchair Mobility    Modified Rankin (Stroke Patients Only)       Balance     Sitting balance-Leahy Scale: Fair       Standing balance-Leahy Scale: Poor Standing balance comment: required min A to maintain balance                    Cognition Arousal/Alertness:  Awake/alert Behavior During Therapy: WFL for tasks assessed/performed Overall Cognitive Status: No family/caregiver present to determine baseline cognitive functioning   Orientation Level: Disoriented to;Place;Situation   Memory: Decreased short-term memory Following Commands: Follows one step commands with increased time;Follows multi-step commands inconsistently Safety/Judgement: Decreased awareness of deficits   Problem Solving: Slow processing;Difficulty sequencing;Requires verbal cues General Comments: No family present to determine pt's baseline level of cognition.      Exercises      General Comments        Pertinent Vitals/Pain Pain Assessment: No/denies pain    Home Living                      Prior Function            PT Goals (current goals can now be found in the care plan section) Progress towards PT goals: Progressing toward goals    Frequency  Min 2X/week    PT Plan Current plan remains appropriate    Co-evaluation             End of Session Equipment Utilized During Treatment: Gait belt Activity Tolerance: Patient tolerated treatment well Patient left: in chair;with call bell/phone within reach     Time: 1041-1106 PT Time Calculation (min) (ACUTE ONLY): 25 min  Charges:  $Gait Training: 8-22 mins $Therapeutic Activity: 8-22 mins  G Codes:      Jacqualyn Posey 10/06/2014, 11:58 AM 10/06/2014 Jacqualyn Posey PTA [phone] pager [phone] office

## 2014-10-06 NOTE — Discharge Instructions (Signed)
Diabetic Ketoacidosis °Diabetic ketoacidosis (DKA) is a life-threatening complication of type 1 diabetes. It must be quickly recognized and treated. Treatment requires hospitalization. °CAUSES  °When there is no insulin in the body, glucose (sugar) cannot be used, and the body breaks down fat for energy. When fat breaks down, acids (ketones) build up in the blood. Very high levels of glucose and high levels of acids lead to severe loss of body fluids (dehydration) and other dangerous chemical changes. This stresses your vital organs and can cause coma or death. °SIGNS AND SYMPTOMS  °· Tiredness (fatigue). °· Weight loss. °· Excessive thirst. °· Ketones in your urine. °· Light-headedness. °· Fruity or sweet smelling breath. °· Excessive urination. °· Visual changes. °· Confusion or irritability. °· Nausea or vomiting. °· Rapid breathing. °· Stomachache or abdominal pain. °DIAGNOSIS  °Your health care provider will diagnose DKA based on your history, physical exam, and blood tests. The health care provider will check to see if you have another illness that caused you to go into DKA. Most of this will be done quickly in an emergency room. °TREATMENT  °· Fluid replacement to correct dehydration. °· Insulin. °· Correction of electrolytes, such as potassium and sodium. °· Antibiotic medicines. °PREVENTION °· Always take your insulin. Do not skip your insulin injections. °· If you are sick, treat yourself quickly. Your body often needs more insulin to fight the illness. °· Check your blood glucose regularly. °· Check urine ketones if your blood glucose is greater than 240 milligrams per deciliter (mg/dL). °· Do not use outdated (expired) insulin. °· If your blood glucose is high, drink plenty of fluids. This helps flush out ketones. °HOME CARE INSTRUCTIONS  °· If you are sick, follow the advice of your health care provider. °· To prevent dehydration, drink enough water and fluids to keep your urine clear or pale  yellow. °¨ If you cannot eat, alternate between drinking fluids with sugar (soda, juices, flavored gelatin) and salty fluids (broth, bouillon). °¨ If you can eat, follow your usual diet and drink sugar-free liquids (water, diet drinks). °· Always take your usual dose of insulin. If you cannot eat or if your glucose is getting too low, call your health care provider for further instructions. °· Continue to monitor your blood or urine ketones every 3-4 hours around the clock. Set your alarm clock or have someone wake you up. If you are too sick, have someone test it for you. °· Rest and avoid exercise. °SEEK MEDICAL CARE IF:  °· You have a fever. °· You have ketones in your urine, or your blood glucose is higher than a level your health care provider suggests. You may need extra insulin. Call your health care provider if you need advice on adjusting your insulin. °· You cannot drink at least a tablespoon (15 mL) of fluid every 15-20 minutes. °· You have been vomiting for more than 2 hours. °· You have symptoms of DKA: °¨ Fruity smelling breath. °¨ Breathing faster or slower. °¨ Becoming very sleepy. °SEEK IMMEDIATE MEDICAL CARE IF:  °· You have signs of dehydration: °¨ Decreased urination. °¨ Increased thirst. °¨ Dry skin and mouth. °¨ Light-headedness. °· Your blood glucose is very high (as advised by your health care provider) twice in a row. °· You faint. °· You have chest pain or trouble breathing. °· You have a sudden, severe headache. °· You have sudden weakness in one arm or one leg. °· You have sudden trouble speaking or swallowing. °· You   have vomiting or diarrhea that is getting worse after 3 hours. °· You have abdominal pain. °MAKE SURE YOU:  °· Understand these instructions. °· Will watch your condition. °· Will get help right away if you are not doing well or get worse. °Document Released: 09/15/2000 Document Revised: 09/23/2013 Document Reviewed: 03/24/2009 °Exit[employer]® Patient Information ©2015 [employer]  [employer]. This information is not intended to replace advice given to you by your health care provider. Make sure you discuss any questions you have with your health care provider. ° °
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
## 2014-10-07 ENCOUNTER — Non-Acute Institutional Stay (SKILLED_NURSING_FACILITY): Payer: Medicare Other | Admitting: Adult Health

## 2014-10-07 ENCOUNTER — Encounter: Payer: Self-pay | Admitting: Adult Health

## 2014-10-07 DIAGNOSIS — M62422 Contracture of muscle, left upper arm: Secondary | ICD-10-CM

## 2014-10-07 DIAGNOSIS — I10 Essential (primary) hypertension: Secondary | ICD-10-CM

## 2014-10-07 DIAGNOSIS — R252 Cramp and spasm: Secondary | ICD-10-CM

## 2014-10-07 DIAGNOSIS — F1910 Other psychoactive substance abuse, uncomplicated: Secondary | ICD-10-CM

## 2014-10-07 DIAGNOSIS — E1310 Other specified diabetes mellitus with ketoacidosis without coma: Secondary | ICD-10-CM

## 2014-10-07 DIAGNOSIS — E1110 Type 2 diabetes mellitus with ketoacidosis without coma: Secondary | ICD-10-CM

## 2014-10-07 DIAGNOSIS — I639 Cerebral infarction, unspecified: Secondary | ICD-10-CM

## 2014-10-07 DIAGNOSIS — I2510 Atherosclerotic heart disease of native coronary artery without angina pectoris: Secondary | ICD-10-CM

## 2014-10-07 DIAGNOSIS — R258 Other abnormal involuntary movements: Secondary | ICD-10-CM

## 2014-10-07 MED ORDER — INSULIN GLARGINE 100 UNIT/ML ~~LOC~~ SOLN: 20.0000 [IU] | Freq: Every day | SUBCUTANEOUS | Status: DC

## 2014-10-07 MED ORDER — INSULIN ASPART 100 UNIT/ML ~~LOC~~ SOLN: 5.0000 [IU] | Freq: Three times a day (TID) | SUBCUTANEOUS | Status: DC
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
## 2014-10-08 ENCOUNTER — Non-Acute Institutional Stay (SKILLED_NURSING_FACILITY): Payer: Medicare Other | Admitting: Internal Medicine

## 2014-10-08 ENCOUNTER — Encounter: Payer: Self-pay | Admitting: Internal Medicine

## 2014-10-08 DIAGNOSIS — E1165 Type 2 diabetes mellitus with hyperglycemia: Secondary | ICD-10-CM

## 2014-10-08 DIAGNOSIS — I2583 Coronary atherosclerosis due to lipid rich plaque: Secondary | ICD-10-CM

## 2014-10-08 DIAGNOSIS — IMO0002 Reserved for concepts with insufficient information to code with codable children: Secondary | ICD-10-CM | POA: Insufficient documentation

## 2014-10-08 DIAGNOSIS — E43 Unspecified severe protein-calorie malnutrition: Secondary | ICD-10-CM

## 2014-10-08 DIAGNOSIS — Z8673 Personal history of transient ischemic attack (TIA), and cerebral infarction without residual deficits: Secondary | ICD-10-CM

## 2014-10-08 DIAGNOSIS — I2510 Atherosclerotic heart disease of native coronary artery without angina pectoris: Secondary | ICD-10-CM

## 2014-10-08 DIAGNOSIS — M62422 Contracture of muscle, left upper arm: Secondary | ICD-10-CM

## 2014-10-08 DIAGNOSIS — I10 Essential (primary) hypertension: Secondary | ICD-10-CM

## 2014-10-08 DIAGNOSIS — J449 Chronic obstructive pulmonary disease, unspecified: Secondary | ICD-10-CM

## 2014-10-08 DIAGNOSIS — F1910 Other psychoactive substance abuse, uncomplicated: Secondary | ICD-10-CM
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
## 2014-10-09 ENCOUNTER — Telehealth: Payer: Self-pay | Admitting: *Deleted

## 2014-10-09 NOTE — Telephone Encounter (Signed)
Then we will take him off of the schedule until we can clear this up. Thank you.

## 2014-10-09 NOTE — Telephone Encounter (Signed)
Spoke to patient and he is currently living at [facility] home. She states he has not been to any doctors lately and he does not have bed bugs. Patient is confused on what he is suppose to do and does not remember talking about Botox.

## 2014-10-12 ENCOUNTER — Ambulatory Visit: Payer: Medicare Other | Admitting: Neurology

## 2014-10-13 ENCOUNTER — Non-Acute Institutional Stay (SKILLED_NURSING_FACILITY): Payer: Medicare Other | Admitting: Adult Health

## 2014-10-13 DIAGNOSIS — R609 Edema, unspecified: Secondary | ICD-10-CM

## 2014-10-13 DIAGNOSIS — L03116 Cellulitis of left lower limb: Secondary | ICD-10-CM

## 2014-11-02 ENCOUNTER — Encounter: Payer: Self-pay | Admitting: Adult Health

## 2014-11-02 DIAGNOSIS — R609 Edema, unspecified: Secondary | ICD-10-CM | POA: Insufficient documentation

## 2014-11-02 DIAGNOSIS — L03116 Cellulitis of left lower limb: Secondary | ICD-10-CM | POA: Insufficient documentation

## 2014-11-02 MED ORDER — POTASSIUM CHLORIDE CRYS ER 10 MEQ PO TBCR: 10.0000 meq | EXTENDED_RELEASE_TABLET | Freq: Every day | ORAL | Status: DC

## 2014-11-02 MED ORDER — FUROSEMIDE 20 MG PO TABS: 20.0000 mg | ORAL_TABLET | Freq: Every day | ORAL | Status: DC
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
## 2014-12-24 ENCOUNTER — Encounter: Payer: Self-pay | Admitting: Neurology

## 2016-04-29 ENCOUNTER — Emergency Department (HOSPITAL_COMMUNITY): Payer: Medicare Other

## 2016-04-29 ENCOUNTER — Other Ambulatory Visit: Payer: Self-pay

## 2016-04-29 ENCOUNTER — Encounter (HOSPITAL_COMMUNITY): Payer: Self-pay | Admitting: Emergency Medicine

## 2016-04-29 ENCOUNTER — Emergency Department (HOSPITAL_COMMUNITY)
Admission: EM | Admit: 2016-04-29 | Discharge: 2016-04-29 | Disposition: A | Discharge status: Home / Self Care | Payer: Medicare Other | Attending: Emergency Medicine | Admitting: Emergency Medicine

## 2016-04-29 DIAGNOSIS — N390 Urinary tract infection, site not specified: Secondary | ICD-10-CM

## 2016-04-29 DIAGNOSIS — Z7984 Long term (current) use of oral hypoglycemic drugs: Secondary | ICD-10-CM | POA: Insufficient documentation

## 2016-04-29 DIAGNOSIS — N329 Bladder disorder, unspecified: Secondary | ICD-10-CM | POA: Insufficient documentation

## 2016-04-29 DIAGNOSIS — I10 Essential (primary) hypertension: Secondary | ICD-10-CM | POA: Insufficient documentation

## 2016-04-29 DIAGNOSIS — J449 Chronic obstructive pulmonary disease, unspecified: Secondary | ICD-10-CM | POA: Diagnosis not present

## 2016-04-29 DIAGNOSIS — Z8673 Personal history of transient ischemic attack (TIA), and cerebral infarction without residual deficits: Secondary | ICD-10-CM | POA: Insufficient documentation

## 2016-04-29 DIAGNOSIS — E119 Type 2 diabetes mellitus without complications: Secondary | ICD-10-CM | POA: Insufficient documentation

## 2016-04-29 DIAGNOSIS — I2510 Atherosclerotic heart disease of native coronary artery without angina pectoris: Secondary | ICD-10-CM | POA: Diagnosis not present

## 2016-04-29 DIAGNOSIS — Z79899 Other long term (current) drug therapy: Secondary | ICD-10-CM | POA: Diagnosis not present

## 2016-04-29 DIAGNOSIS — Z794 Long term (current) use of insulin: Secondary | ICD-10-CM | POA: Diagnosis not present

## 2016-04-29 DIAGNOSIS — I252 Old myocardial infarction: Secondary | ICD-10-CM | POA: Insufficient documentation

## 2016-04-29 DIAGNOSIS — R319 Hematuria, unspecified: Secondary | ICD-10-CM

## 2016-04-29 DIAGNOSIS — R1030 Lower abdominal pain, unspecified: Secondary | ICD-10-CM | POA: Insufficient documentation

## 2016-04-29 DIAGNOSIS — F17210 Nicotine dependence, cigarettes, uncomplicated: Secondary | ICD-10-CM | POA: Insufficient documentation

## 2016-04-29 DIAGNOSIS — N3289 Other specified disorders of bladder: Secondary | ICD-10-CM

## 2016-04-29 DIAGNOSIS — R300 Dysuria: Secondary | ICD-10-CM | POA: Diagnosis present

## 2016-04-29 LAB — URINALYSIS, ROUTINE W REFLEX MICROSCOPIC
Bilirubin Urine: NEGATIVE
GLUCOSE, UA: NEGATIVE mg/dL
Ketones, ur: NEGATIVE mg/dL
Nitrite: NEGATIVE
PROTEIN: 100 mg/dL — AB
SPECIFIC GRAVITY, URINE: 1.019 (ref 1.005–1.030)
pH: 6.5 (ref 5.0–8.0)

## 2016-04-29 LAB — COMPREHENSIVE METABOLIC PANEL
ALT: 13 U/L — ABNORMAL LOW (ref 17–63)
AST: 18 U/L (ref 15–41)
Albumin: 3.4 g/dL — ABNORMAL LOW (ref 3.5–5.0)
Alkaline Phosphatase: 98 U/L (ref 38–126)
Anion gap: 6 (ref 5–15)
BUN: 14 mg/dL (ref 6–20)
CO2: 26 mmol/L (ref 22–32)
Calcium: 9.2 mg/dL (ref 8.9–10.3)
Chloride: 106 mmol/L (ref 101–111)
Creatinine, Ser: 0.93 mg/dL (ref 0.61–1.24)
GFR calc Af Amer: >60 mL/min (ref >60)
Glucose, Bld: 179 mg/dL — ABNORMAL HIGH (ref 65–99)
POTASSIUM: 4.1 mmol/L (ref 3.5–5.1)
Sodium: 138 mmol/L (ref 135–145)
Total Bilirubin: 0.5 mg/dL (ref 0.3–1.2)
Total Protein: 7.7 g/dL (ref 6.5–8.1)

## 2016-04-29 LAB — CBC WITH DIFFERENTIAL/PLATELET
Basophils Absolute: 0 10*3/uL (ref 0.0–0.1)
Basophils Relative: 0 %
Eosinophils Absolute: 0.2 10*3/uL (ref 0.0–0.7)
Eosinophils Relative: 3 %
HEMATOCRIT: 37.9 % — AB (ref 39.0–52.0)
Hemoglobin: 11.6 g/dL — ABNORMAL LOW (ref 13.0–17.0)
LYMPHS ABS: 2.4 10*3/uL (ref 0.7–4.0)
Lymphocytes Relative: 29 %
MCH: 24.2 pg — AB (ref 26.0–34.0)
MCHC: 30.6 g/dL (ref 30.0–36.0)
MCV: 79.1 fL (ref 78.0–100.0)
Monocytes Absolute: 0.5 10*3/uL (ref 0.1–1.0)
Monocytes Relative: 6 %
NEUTROS PCT: 62 %
Neutro Abs: 5.4 10*3/uL (ref 1.7–7.7)
Platelets: 380 10*3/uL (ref 150–400)
RBC: 4.79 MIL/uL (ref 4.22–5.81)
RDW: 18.3 % — ABNORMAL HIGH (ref 11.5–15.5)
WBC: 8.5 10*3/uL (ref 4.0–10.5)

## 2016-04-29 LAB — URINE MICROSCOPIC-ADD ON: Squamous Epithelial / LPF: NONE SEEN

## 2016-04-29 LAB — I-STAT TROPONIN, ED: Troponin i, poc: 0 ng/mL (ref 0.00–0.08)

## 2016-04-29 MED ORDER — CEPHALEXIN 500 MG PO CAPS: 500.0000 mg | ORAL_CAPSULE | Freq: Three times a day (TID) | ORAL | 0 refills | Status: DC

## 2016-04-29 MED ORDER — CEFTRIAXONE SODIUM 1 G IJ SOLR
1.0000 g | Freq: Once | INTRAMUSCULAR | Status: AC
  Administered 2016-04-29: 1 g via INTRAVENOUS
  Filled 2016-04-29: qty 10

## 2016-04-29 NOTE — ED Notes (Signed)
Patient denies pain.  Assisted patient to void with urinal.

## 2016-04-29 NOTE — ED Notes (Signed)
Bed: [id number] Expected date:  Expected time:  Means of arrival:  Comments: 75 yo hematuria

## 2016-04-29 NOTE — ED Notes (Signed)
Facility was called to transfer patient back to facility.  Spoke to [facility]
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
## 2016-04-29 NOTE — ED Triage Notes (Signed)
Per EMS, patient is from [facility].  Facility states he has some blood in urine, difficulty to urinate and painful when urinating.     BP:148/90 HR:70 O2:96%  R:16

## 2016-04-29 NOTE — ED Notes (Signed)
Call placed to [facility] 

## 2016-04-29 NOTE — Discharge Instructions (Signed)
You have a urinary tract infection as well as a possible tumor in your bladder. You need to see a urologist in the next week to have this possible tumor further evaluated. If you have significant pain, fevers or cannot empty your bladder get rechecked immediately. Do not take your aspirin until the urologist tells you it is okay to restart it.

## 2016-04-29 NOTE — ED Notes (Signed)
Patient understood discharge instructions.  Patient is A & O x 4.

## 2016-05-01 LAB — URINE CULTURE

## 2016-05-04 ENCOUNTER — Inpatient Hospital Stay (HOSPITAL_COMMUNITY)
Admission: EM | Admit: 2016-05-04 | Discharge: 2016-05-07 | DRG: 637 | Disposition: A | Discharge status: Skilled Nursing Facility | Payer: Medicare Other | Attending: Family Medicine | Admitting: Family Medicine

## 2016-05-04 ENCOUNTER — Encounter (HOSPITAL_COMMUNITY): Payer: Self-pay | Admitting: Emergency Medicine

## 2016-05-04 ENCOUNTER — Emergency Department (HOSPITAL_COMMUNITY): Payer: Medicare Other

## 2016-05-04 DIAGNOSIS — I2510 Atherosclerotic heart disease of native coronary artery without angina pectoris: Secondary | ICD-10-CM | POA: Diagnosis present

## 2016-05-04 DIAGNOSIS — G9340 Encephalopathy, unspecified: Secondary | ICD-10-CM | POA: Diagnosis present

## 2016-05-04 DIAGNOSIS — E11649 Type 2 diabetes mellitus with hypoglycemia without coma: Principal | ICD-10-CM | POA: Diagnosis present

## 2016-05-04 DIAGNOSIS — J449 Chronic obstructive pulmonary disease, unspecified: Secondary | ICD-10-CM | POA: Diagnosis present

## 2016-05-04 DIAGNOSIS — IMO0002 Reserved for concepts with insufficient information to code with codable children: Secondary | ICD-10-CM | POA: Diagnosis present

## 2016-05-04 DIAGNOSIS — M069 Rheumatoid arthritis, unspecified: Secondary | ICD-10-CM | POA: Diagnosis present

## 2016-05-04 DIAGNOSIS — I252 Old myocardial infarction: Secondary | ICD-10-CM | POA: Diagnosis not present

## 2016-05-04 DIAGNOSIS — Z8673 Personal history of transient ischemic attack (TIA), and cerebral infarction without residual deficits: Secondary | ICD-10-CM

## 2016-05-04 DIAGNOSIS — N179 Acute kidney failure, unspecified: Secondary | ICD-10-CM | POA: Diagnosis present

## 2016-05-04 DIAGNOSIS — R4182 Altered mental status, unspecified: Secondary | ICD-10-CM | POA: Diagnosis present

## 2016-05-04 DIAGNOSIS — Z6822 Body mass index (BMI) 22.0-22.9, adult: Secondary | ICD-10-CM | POA: Diagnosis not present

## 2016-05-04 DIAGNOSIS — E1165 Type 2 diabetes mellitus with hyperglycemia: Secondary | ICD-10-CM | POA: Diagnosis present

## 2016-05-04 DIAGNOSIS — R112 Nausea with vomiting, unspecified: Secondary | ICD-10-CM

## 2016-05-04 DIAGNOSIS — Z87442 Personal history of urinary calculi: Secondary | ICD-10-CM

## 2016-05-04 DIAGNOSIS — E46 Unspecified protein-calorie malnutrition: Secondary | ICD-10-CM | POA: Diagnosis present

## 2016-05-04 DIAGNOSIS — Z8701 Personal history of pneumonia (recurrent): Secondary | ICD-10-CM | POA: Diagnosis not present

## 2016-05-04 DIAGNOSIS — R319 Hematuria, unspecified: Secondary | ICD-10-CM

## 2016-05-04 DIAGNOSIS — E118 Type 2 diabetes mellitus with unspecified complications: Secondary | ICD-10-CM | POA: Diagnosis not present

## 2016-05-04 DIAGNOSIS — I10 Essential (primary) hypertension: Secondary | ICD-10-CM | POA: Diagnosis present

## 2016-05-04 DIAGNOSIS — F0390 Unspecified dementia without behavioral disturbance: Secondary | ICD-10-CM | POA: Diagnosis present

## 2016-05-04 DIAGNOSIS — E162 Hypoglycemia, unspecified: Secondary | ICD-10-CM

## 2016-05-04 DIAGNOSIS — E440 Moderate protein-calorie malnutrition: Secondary | ICD-10-CM | POA: Insufficient documentation

## 2016-05-04 DIAGNOSIS — E860 Dehydration: Secondary | ICD-10-CM | POA: Diagnosis present

## 2016-05-04 DIAGNOSIS — E876 Hypokalemia: Secondary | ICD-10-CM | POA: Diagnosis present

## 2016-05-04 DIAGNOSIS — Z87440 Personal history of urinary (tract) infections: Secondary | ICD-10-CM | POA: Diagnosis not present

## 2016-05-04 DIAGNOSIS — N390 Urinary tract infection, site not specified: Secondary | ICD-10-CM

## 2016-05-04 DIAGNOSIS — F17210 Nicotine dependence, cigarettes, uncomplicated: Secondary | ICD-10-CM | POA: Diagnosis present

## 2016-05-04 DIAGNOSIS — E43 Unspecified severe protein-calorie malnutrition: Secondary | ICD-10-CM | POA: Diagnosis present

## 2016-05-04 DIAGNOSIS — R0902 Hypoxemia: Secondary | ICD-10-CM

## 2016-05-04 DIAGNOSIS — R252 Cramp and spasm: Secondary | ICD-10-CM | POA: Diagnosis present

## 2016-05-04 LAB — CBC WITH DIFFERENTIAL/PLATELET
BASOS ABS: 0 10*3/uL (ref 0.0–0.1)
BASOS PCT: 0 %
EOS ABS: 0.2 10*3/uL (ref 0.0–0.7)
Eosinophils Relative: 2 %
HCT: 33.2 % — ABNORMAL LOW (ref 39.0–52.0)
HEMOGLOBIN: 10.1 g/dL — AB (ref 13.0–17.0)
Lymphocytes Relative: 18 %
Lymphs Abs: 1.8 10*3/uL (ref 0.7–4.0)
MCH: 24.1 pg — ABNORMAL LOW (ref 26.0–34.0)
MCHC: 30.4 g/dL (ref 30.0–36.0)
MCV: 79.2 fL (ref 78.0–100.0)
Monocytes Absolute: 0.7 10*3/uL (ref 0.1–1.0)
Monocytes Relative: 7 %
NEUTROS PCT: 73 %
Neutro Abs: 7.4 10*3/uL (ref 1.7–7.7)
Platelets: 369 10*3/uL (ref 150–400)
RBC: 4.19 MIL/uL — AB (ref 4.22–5.81)
RDW: 18.6 % — ABNORMAL HIGH (ref 11.5–15.5)
WBC: 10.1 10*3/uL (ref 4.0–10.5)

## 2016-05-04 LAB — COMPREHENSIVE METABOLIC PANEL
ALBUMIN: 3.2 g/dL — AB (ref 3.5–5.0)
ALT: 12 U/L — AB (ref 17–63)
AST: 21 U/L (ref 15–41)
Alkaline Phosphatase: 81 U/L (ref 38–126)
Anion gap: 10 (ref 5–15)
BUN: 24 mg/dL — ABNORMAL HIGH (ref 6–20)
CALCIUM: 9.1 mg/dL (ref 8.9–10.3)
CO2: 22 mmol/L (ref 22–32)
Chloride: 107 mmol/L (ref 101–111)
Creatinine, Ser: 1.57 mg/dL — ABNORMAL HIGH (ref 0.61–1.24)
GFR calc non Af Amer: 41 mL/min — ABNORMAL LOW (ref >60)
GFR, EST AFRICAN AMERICAN: 48 mL/min — AB (ref >60)
Glucose, Bld: 70 mg/dL (ref 65–99)
Potassium: 3.1 mmol/L — ABNORMAL LOW (ref 3.5–5.1)
Sodium: 139 mmol/L (ref 135–145)
TOTAL PROTEIN: 6.6 g/dL (ref 6.5–8.1)
Total Bilirubin: 0.2 mg/dL — ABNORMAL LOW (ref 0.3–1.2)

## 2016-05-04 LAB — CBG MONITORING, ED
GLUCOSE-CAPILLARY: 214 mg/dL — AB (ref 65–99)
GLUCOSE-CAPILLARY: 68 mg/dL (ref 65–99)
GLUCOSE-CAPILLARY: 56 mg/dL — AB (ref 65–99)
Glucose-Capillary: 171 mg/dL — ABNORMAL HIGH (ref 65–99)
Glucose-Capillary: >600 mg/dL (ref 65–99)
Glucose-Capillary: 112 mg/dL — ABNORMAL HIGH (ref 65–99)
Glucose-Capillary: 67 mg/dL (ref 65–99)

## 2016-05-04 LAB — URINALYSIS, ROUTINE W REFLEX MICROSCOPIC
Bilirubin Urine: NEGATIVE
Glucose, UA: NEGATIVE mg/dL
Ketones, ur: NEGATIVE mg/dL
NITRITE: NEGATIVE
PROTEIN: NEGATIVE mg/dL
Specific Gravity, Urine: 1.016 (ref 1.005–1.030)
pH: 5 (ref 5.0–8.0)

## 2016-05-04 LAB — MAGNESIUM: MAGNESIUM: 1.7 mg/dL (ref 1.7–2.4)

## 2016-05-04 LAB — URINE MICROSCOPIC-ADD ON

## 2016-05-04 LAB — BRAIN NATRIURETIC PEPTIDE: B Natriuretic Peptide: 43.8 pg/mL (ref 0.0–100.0)

## 2016-05-04 LAB — RAPID URINE DRUG SCREEN, HOSP PERFORMED
AMPHETAMINES: NOT DETECTED
BARBITURATES: NOT DETECTED
Benzodiazepines: NOT DETECTED
Cocaine: NOT DETECTED
OPIATES: NOT DETECTED
TETRAHYDROCANNABINOL: NOT DETECTED

## 2016-05-04 LAB — SALICYLATE LEVEL: Salicylate Lvl: <4 mg/dL (ref 2.8–30.0)

## 2016-05-04 LAB — LACTIC ACID, PLASMA: Lactic Acid, Venous: 2 mmol/L (ref 0.5–1.9)

## 2016-05-04 LAB — ETHANOL: Alcohol, Ethyl (B): <5 mg/dL (ref <5)

## 2016-05-04 LAB — ACETAMINOPHEN LEVEL: Acetaminophen (Tylenol), Serum: <10 ug/mL — ABNORMAL LOW (ref 10–30)

## 2016-05-04 LAB — I-STAT CG4 LACTIC ACID, ED: LACTIC ACID, VENOUS: 1.98 mmol/L — AB (ref 0.5–1.9)

## 2016-05-04 MED ORDER — LEVOFLOXACIN IN D5W 750 MG/150ML IV SOLN
750.0000 mg | INTRAVENOUS | Status: DC
  Administered 2016-05-04: 750 mg via INTRAVENOUS
  Filled 2016-05-04: qty 150

## 2016-05-04 MED ORDER — SODIUM CHLORIDE 0.9% FLUSH
3.0000 mL | Freq: Two times a day (BID) | INTRAVENOUS | Status: DC
  Administered 2016-05-04 – 2016-05-07 (×4): 3 mL via INTRAVENOUS

## 2016-05-04 MED ORDER — DEXTROSE 50 % IV SOLN
INTRAVENOUS | Status: AC
  Administered 2016-05-04: 50 mL
  Filled 2016-05-04: qty 50

## 2016-05-04 MED ORDER — VENLAFAXINE HCL ER 75 MG PO CP24
150.0000 mg | ORAL_CAPSULE | Freq: Every day | ORAL | Status: DC
  Administered 2016-05-05 – 2016-05-07 (×3): 150 mg via ORAL
  Filled 2016-05-04 (×4): qty 2

## 2016-05-04 MED ORDER — SIMVASTATIN 10 MG PO TABS
5.0000 mg | ORAL_TABLET | Freq: Every day | ORAL | Status: DC
  Administered 2016-05-05 – 2016-05-06 (×2): 5 mg via ORAL
  Filled 2016-05-04 (×4): qty 1

## 2016-05-04 MED ORDER — INSULIN ASPART 100 UNIT/ML ~~LOC~~ SOLN
0.0000 [IU] | Freq: Three times a day (TID) | SUBCUTANEOUS | Status: DC
  Administered 2016-05-05: 1 [IU] via SUBCUTANEOUS
  Administered 2016-05-05 – 2016-05-06 (×4): 2 [IU] via SUBCUTANEOUS
  Administered 2016-05-07: 5 [IU] via SUBCUTANEOUS
  Administered 2016-05-07: 2 [IU] via SUBCUTANEOUS

## 2016-05-04 MED ORDER — FOLIC ACID 1 MG PO TABS
1.0000 mg | ORAL_TABLET | Freq: Every day | ORAL | Status: DC
Start: 2016-05-05 — End: 2016-05-07
  Administered 2016-05-05 – 2016-05-07 (×3): 1 mg via ORAL
  Filled 2016-05-04 (×3): qty 1

## 2016-05-04 MED ORDER — SODIUM CHLORIDE 0.9 % IV SOLN: INTRAVENOUS | Status: DC

## 2016-05-04 MED ORDER — VITAMIN B-1 100 MG PO TABS
100.0000 mg | ORAL_TABLET | Freq: Every day | ORAL | Status: DC
  Administered 2016-05-05 – 2016-05-07 (×3): 100 mg via ORAL
  Filled 2016-05-04 (×3): qty 1

## 2016-05-04 MED ORDER — DEXTROSE 50 % IV SOLN
INTRAVENOUS | Status: AC
  Filled 2016-05-04: qty 50

## 2016-05-04 MED ORDER — DEXTROSE-NACL 5-0.9 % IV SOLN
INTRAVENOUS | Status: DC
  Administered 2016-05-04 – 2016-05-06 (×3): via INTRAVENOUS

## 2016-05-04 MED ORDER — SODIUM CHLORIDE 0.9 % IV BOLUS (SEPSIS)
1000.0000 mL | Freq: Once | INTRAVENOUS | Status: AC
  Administered 2016-05-04: 1000 mL via INTRAVENOUS

## 2016-05-04 MED ORDER — INSULIN ASPART 100 UNIT/ML ~~LOC~~ SOLN
10.0000 [IU] | Freq: Once | SUBCUTANEOUS | Status: AC
  Administered 2016-05-04: 10 [IU] via SUBCUTANEOUS
  Filled 2016-05-04: qty 1

## 2016-05-04 MED ORDER — POTASSIUM CHLORIDE 10 MEQ/100ML IV SOLN
10.0000 meq | INTRAVENOUS | Status: AC
  Administered 2016-05-04 (×3): 10 meq via INTRAVENOUS
  Filled 2016-05-04 (×3): qty 100

## 2016-05-04 MED ORDER — INSULIN ASPART 100 UNIT/ML ~~LOC~~ SOLN: 10.0000 [IU] | Freq: Once | SUBCUTANEOUS | Status: DC

## 2016-05-04 MED ORDER — AMLODIPINE BESYLATE 2.5 MG PO TABS
2.5000 mg | ORAL_TABLET | Freq: Every day | ORAL | Status: DC
  Administered 2016-05-05 – 2016-05-07 (×3): 2.5 mg via ORAL
  Filled 2016-05-04 (×3): qty 1

## 2016-05-04 MED ORDER — DEXTROSE-NACL 5-0.9 % IV SOLN
INTRAVENOUS | Status: DC
  Filled 2016-05-04 (×2): qty 1000
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
## 2016-05-04 NOTE — ED Notes (Signed)
Last cbg by ems was 150

## 2016-05-04 NOTE — ED Triage Notes (Signed)
Pt here from [facility] with cbg of 48 pt received D10 , cbg went up to 272, pt is now at baseline with history of dementia

## 2016-05-04 NOTE — ED Notes (Signed)
Report called to [place], RN at this time.  Receiving nurse denies having any further questions at this time.
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
## 2016-05-04 NOTE — ED Notes (Signed)
Pt now alert, able to communicate verbally w/ RN. Still confused. Admitting at bedside

## 2016-05-04 NOTE — Progress Notes (Signed)
CRITICAL VALUE ALERT  Critical value received:  Lactic acid 2.0  Date of notification:  05/04/16   Time of notification:  2217  Critical value read back:Yes.    Nurse who received alert:  Reap, Jon Gills   MD notified (1st page):  FMTS intern  Time of first page:  2218  MD notified (2nd page):  Time of second page:  Responding MD:  FMTS intern  Time MD responded:  2221

## 2016-05-04 NOTE — ED Notes (Signed)
Pt lethargic, this RN checked CBG - resulted as 68. Dr. Maryan Rued aware and verbal order to give amp d50 and start d10 at 11ml/hr.

## 2016-05-04 NOTE — Progress Notes (Signed)
Hypoglycemic Event  CBG: 30  Treatment: D50 IV 50 mL  Symptoms: None Lethargy, tachycardia 108  Follow-up CBG: Time:2235 CBG Result:129  Possible Reasons for Event: Unknown  Comments/MD notified: FMTS, orders to continue to assess CBG and d50 PRN, D5NS at 125 infusing.    Reap, Jon Gills

## 2016-05-05 ENCOUNTER — Inpatient Hospital Stay (HOSPITAL_COMMUNITY): Payer: Medicare Other

## 2016-05-05 DIAGNOSIS — Z794 Long term (current) use of insulin: Secondary | ICD-10-CM

## 2016-05-05 DIAGNOSIS — I2510 Atherosclerotic heart disease of native coronary artery without angina pectoris: Secondary | ICD-10-CM

## 2016-05-05 DIAGNOSIS — E440 Moderate protein-calorie malnutrition: Secondary | ICD-10-CM | POA: Insufficient documentation

## 2016-05-05 DIAGNOSIS — N179 Acute kidney failure, unspecified: Secondary | ICD-10-CM

## 2016-05-05 DIAGNOSIS — E1165 Type 2 diabetes mellitus with hyperglycemia: Secondary | ICD-10-CM

## 2016-05-05 DIAGNOSIS — E118 Type 2 diabetes mellitus with unspecified complications: Secondary | ICD-10-CM

## 2016-05-05 DIAGNOSIS — E876 Hypokalemia: Secondary | ICD-10-CM

## 2016-05-05 DIAGNOSIS — I10 Essential (primary) hypertension: Secondary | ICD-10-CM

## 2016-05-05 DIAGNOSIS — R4182 Altered mental status, unspecified: Secondary | ICD-10-CM

## 2016-05-05 LAB — BASIC METABOLIC PANEL
ANION GAP: 7 (ref 5–15)
BUN: 15 mg/dL (ref 6–20)
CHLORIDE: 107 mmol/L (ref 101–111)
CO2: 25 mmol/L (ref 22–32)
Calcium: 8.6 mg/dL — ABNORMAL LOW (ref 8.9–10.3)
Creatinine, Ser: 1.05 mg/dL (ref 0.61–1.24)
GFR calc non Af Amer: >60 mL/min (ref >60)
GLUCOSE: 88 mg/dL (ref 65–99)
Potassium: 3.3 mmol/L — ABNORMAL LOW (ref 3.5–5.1)
Sodium: 139 mmol/L (ref 135–145)

## 2016-05-05 LAB — GLUCOSE, CAPILLARY
GLUCOSE-CAPILLARY: 104 mg/dL — AB (ref 65–99)
GLUCOSE-CAPILLARY: 128 mg/dL — AB (ref 65–99)
GLUCOSE-CAPILLARY: 129 mg/dL — AB (ref 65–99)
GLUCOSE-CAPILLARY: 153 mg/dL — AB (ref 65–99)
GLUCOSE-CAPILLARY: 207 mg/dL — AB (ref 65–99)
GLUCOSE-CAPILLARY: 63 mg/dL — AB (ref 65–99)
GLUCOSE-CAPILLARY: 92 mg/dL (ref 65–99)
GLUCOSE-CAPILLARY: 92 mg/dL (ref 65–99)
GLUCOSE-CAPILLARY: 99 mg/dL (ref 65–99)
Glucose-Capillary: 121 mg/dL — ABNORMAL HIGH (ref 65–99)
Glucose-Capillary: 143 mg/dL — ABNORMAL HIGH (ref 65–99)
Glucose-Capillary: 149 mg/dL — ABNORMAL HIGH (ref 65–99)
Glucose-Capillary: 150 mg/dL — ABNORMAL HIGH (ref 65–99)
Glucose-Capillary: 157 mg/dL — ABNORMAL HIGH (ref 65–99)
Glucose-Capillary: 187 mg/dL — ABNORMAL HIGH (ref 65–99)
Glucose-Capillary: 30 mg/dL — CL (ref 65–99)

## 2016-05-05 LAB — CBC
HEMATOCRIT: 32.7 % — AB (ref 39.0–52.0)
HEMOGLOBIN: 9.8 g/dL — AB (ref 13.0–17.0)
MCH: 24 pg — AB (ref 26.0–34.0)
MCHC: 30 g/dL (ref 30.0–36.0)
MCV: 80.1 fL (ref 78.0–100.0)
Platelets: 338 10*3/uL (ref 150–400)
RBC: 4.08 MIL/uL — AB (ref 4.22–5.81)
RDW: 18.6 % — ABNORMAL HIGH (ref 11.5–15.5)
WBC: 9.9 10*3/uL (ref 4.0–10.5)

## 2016-05-05 LAB — MRSA PCR SCREENING: MRSA by PCR: INVALID — AB

## 2016-05-05 LAB — URINE CULTURE: Culture: NO GROWTH

## 2016-05-05 LAB — VITAMIN B12: VITAMIN B 12: 786 pg/mL (ref 180–914)

## 2016-05-05 LAB — TSH: TSH: 0.256 u[IU]/mL — AB (ref 0.350–4.500)

## 2016-05-05 LAB — LACTIC ACID, PLASMA: LACTIC ACID, VENOUS: 1.6 mmol/L (ref 0.5–1.9)

## 2016-05-05 MED ORDER — CETYLPYRIDINIUM CHLORIDE 0.05 % MT LIQD
7.0000 mL | Freq: Two times a day (BID) | OROMUCOSAL | Status: DC
  Administered 2016-05-05 – 2016-05-07 (×5): 7 mL via OROMUCOSAL

## 2016-05-05 MED ORDER — LEVOFLOXACIN 750 MG PO TABS
750.0000 mg | ORAL_TABLET | ORAL | Status: DC
  Administered 2016-05-05 – 2016-05-07 (×3): 750 mg via ORAL
  Filled 2016-05-05 (×3): qty 1

## 2016-05-05 MED ORDER — DEXTROSE 5 % IV SOLN
2.0000 g | INTRAVENOUS | Status: DC
  Administered 2016-05-05: 2 g via INTRAVENOUS
  Filled 2016-05-05: qty 2

## 2016-05-05 MED ORDER — ENSURE ENLIVE PO LIQD
237.0000 mL | Freq: Two times a day (BID) | ORAL | Status: DC
  Administered 2016-05-05 – 2016-05-07 (×5): 237 mL via ORAL

## 2016-05-05 MED ORDER — POTASSIUM CHLORIDE CRYS ER 20 MEQ PO TBCR
40.0000 meq | EXTENDED_RELEASE_TABLET | Freq: Once | ORAL | Status: AC
  Administered 2016-05-05: 40 meq via ORAL
  Filled 2016-05-05: qty 2

## 2016-05-05 MED ORDER — ENOXAPARIN SODIUM 40 MG/0.4ML ~~LOC~~ SOLN
40.0000 mg | SUBCUTANEOUS | Status: DC
  Administered 2016-05-05 – 2016-05-07 (×3): 40 mg via SUBCUTANEOUS
  Filled 2016-05-05 (×3): qty 0.4

## 2016-05-05 NOTE — Progress Notes (Signed)
Hypoglycemic Event  CBG: 63  Treatment: D50 IV 50 mL  Symptoms: lethargy  Follow-up CBG: Time:0015 CBG Result:207  Possible Reasons for Event: Unknown  Comments/MD notified:    Reap, Jon Gills

## 2016-05-05 NOTE — Progress Notes (Signed)
Family Medicine Teaching Service Daily Progress Note Intern Pager: [phone]  Patient name: [name]. Medical record number: [id number] Date of birth: [birth year] Age: 75 y.o. Gender: male  Primary Care Provider: Elizabeth Palau, MD (Inactive) Consultants: None Code Status: FULL  Pt Overview and Major Events to Date:  Levaquin start date 8/4  Assessment and Plan:  [name]. is a 75 y.o. male presenting from [facility] via EMS with altered mental status thought to be secondary to hypoglycemia. PMH is significant for HTN, DM2, CAD, CVA, COPD, ETOH use, tobacco disorder, protein calorie malnutrition, dementia  Acute Encephalopathy: BG 48 per EMS, given D10.  BG noted to be in 60s in ED and was placed on D10 infusion.  Recently treated for UTI in ED.  He was noted to have a bladder mass on CT renal stone study 04/29/16.  UA with persistent large hgb, moderate leuks, rare bacteria.  CXR also revealing a new left lung base opacity concerning for atelectasis vs pna.  Patient with new O2 requirement, needing 2L [place], for O2 saturation 88% on RA.  He is afebrile, VSS.  No leukocytosis.  LA 1.98.  On exam, he is cachetic appearing.  He responds to questions but is not making sense.  He does follow some commands.  AMS seems to be multifactorial.  Patient appears dehydrated on exam.  He has documented hypoglycemic episodes.  He has what may be a UTI +/- pneumonia w. Hypoxia.  Unfortunately, no family at bedside and very limited paperwork from nursing facility so unsure as to what patient's mental baseline actually is. - Admit to telemetry under Dr Mingo Amber - VS per floor protocol - O2 via [place] prn to maintain sats >92% - Empiric abx with Levaquin renally dosed to cover UTI and possible pna. Renal function improved, Pharm recc switch from Q48 to Q24 - Urine culture pending, Bcx ordered and pending - am CBC hgb 9.8, crit 32,7, BMP K+ 3.3 rest wnl - Negative for ASA, Tylenol and ETOH -UDS:  Negative - Monitor BG as below, most recent sugars ~90s - CT head w/o contrast: No intracranial hemorrhage, unchanged chronic small vessel ischemia, remote lacunar infarcts -CXR: Left lung base opacity, may reflect atelectasis vs PNA. PNA possible but felt less likely, vascular congestion without overt pulmonary edema.  -2view CXR ordered due to unclear imaging: - Neuro checks routine -Daughter Kalman Shan contacted and updated about her father's admission. Code status verified: Full code. Baseline function verified with her. On exam appears to be close to baseline, oriented to person and time, not oriented to place. Otherwise, holding basic conversation and responding appropriately to questions.  -Additional labs ordered for workup: follow up -Thyroid -B12  -Folate -RPR -HIV   AKI: Cr 1.57 on admission, Baseline 0.6-0.9.  Cachetic so this is significant. - s/p 1L bolus - Continue MIVF - Avoid nephrotoxic agents - repeat BMP in am Cr. 1.05, improved.  # Hypokalemia: K 3.1. BG 70 at that time.  Repeat BMP in am K+ 3.3 -68mEq Kdur po one time dose ordered, recheck BMP in am. - Mg level 1.7 wnl  # DM2: s/p 2 hypoglycemic episodes, now hyperglycemic to >600.  - STOP D10 infusion - MIVFs - Monitor BG q2 x4, then q6 - SSI sensitive   # HTN/ CAD: BP 164/99 on admission. On Norvasc and Cozaar at home - Hold ARB - Continue Norvasc in am - unsure as to why he is not on a BB if has h/o CAD.  #  h/o CVA. On Zocor 5.  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 - Continue statin - Hold baclofen for now until mental status improves - Consider changing to statin with less interactions - Consider c/s PT/ OT once mental status improves  # COPD - Monitor - O2 prn as above  # Protein calorie malnutrition - Nutrition consulted  for assessment and recs  FEN/GI: Heart healthy Prophylaxis: Lovenox 40 Qdaily, IV D5NS decreased to 75cc/hr  Disposition: SNF pending mental status  improvement  Subjective:  Overnight patient states he did well, is alert to person and time but not to place. Thought he was at [facility]. He has no current complaints. VSS overnight and remained afebrile. Was informed that his daughter Kalman Shan was contacted about his admission and that we will keep her updated. After speaking with her regarding his baseline mental status, he appears to be consistent with that at current time.   Objective: Temp:  [97 F (36.1 C)-98.4 F (36.9 C)] 98.2 F (36.8 C) (08/04 1137) Pulse Rate:  [75-98] 85 (08/04 1200) Resp:  [8-19] 18 (08/04 1200) BP: (123-173)/(69-153) 149/77 (08/04 1200) SpO2:  [88 %-100 %] 96 % (08/04 1200) Weight:  [151 lb 7.3 oz (68.7 kg)] 151 lb 7.3 oz (68.7 kg) (08/03 2152)    Physical Exam:  General: awake, alert and oriented x2 (not to place), sitting up in bed eating breakfast Eyes: arcus senilis bilaterally, sclera white, EOMI ENTM: MMM, poor dentition Neck: no LAD Cardiovascular: RRR, no murmurs appreciated on exam Respiratory: decreased breath sounds on the left base, otherwise CTAB, no crackles/ wheeze, normal WOB on 2L [place] Abdomen: soft, NT/ND, +BS MSK: thin, poor tone, LUE with contracture, no edema, diminished pulses b/l Skin: no rashes Neuro: follows some commands (opens eyes, wiggles toes); attempts to answer questions but speech not clear Psych: mood appropriate  Laboratory:  Recent Labs Lab 04/29/16 1513 05/04/16 1555 05/05/16 0358  WBC 8.5 10.1 9.9  HGB 11.6* 10.1* 9.8*  HCT 37.9* 33.2* 32.7*  PLT 380 369 338    Recent Labs Lab 04/29/16 1513 05/04/16 1555 05/05/16 0358  NA 138 139 139  K 4.1 3.1* 3.3*  CL 106 107 107  CO2 26 22 25   BUN 14 24* 15  CREATININE 0.93 1.57* 1.05  CALCIUM 9.2 9.1 8.6*  PROT 7.7 6.6  --   BILITOT 0.5 0.2*  --   ALKPHOS 98 81  --   ALT 13* 12*  --   AST 18 21  --   GLUCOSE 179* 70 88     Imaging/Diagnostic Tests:  Dg Chest 2 View  Result Date:  05/05/2016 CLINICAL DATA:  Hypoxia. EXAM: CHEST  2 VIEW COMPARISON:  05/04/2016 FINDINGS: Mild cardiomegaly. Linear densities in the lung bases, likely atelectasis. No visible effusions. No acute bony abnormality. IMPRESSION: Cardiomegaly.  Bibasilar atelectasis. Electronically Signed   By: Rolm Baptise M.D.   On: 05/05/2016 11:03   Ct Head Wo Contrast  Result Date: 05/05/2016 CLINICAL DATA:  Sudden onset of altered mental status. Lethargy and difficult to arouse. EXAM: CT HEAD WITHOUT CONTRAST TECHNIQUE: Contiguous axial images were obtained from the base of the skull through the vertex without intravenous contrast. COMPARISON:  Head CT 10/01/2014 FINDINGS: Brain: Unchanged right occipital encephalomalacia. Unchanged chronic small vessel ischemic change and remote lacunar infarcts in the left basal ganglia and right thalamus. No intracranial hemorrhage, mass effect, or midline shift. No hydrocephalus. The basilar cisterns are patent. No evidence of territorial infarct. No intracranial fluid collection.  Vascular: No hyperdense vessel or abnormal calcification. Skull: Negative for fracture or focal lesion. Calvarium is intact. Sinuses/Orbits: Chronic mucosal thickening of the left frontal sinus and scattered throughout the ethmoid air cells. Mastoid air cells are well aerated. Other: None. IMPRESSION: 1.  No acute intracranial abnormality. 2. Unchanged chronic small vessel ischemia, remote lacunar infarcts, and right occipital encephalomalacia. Electronically Signed   By: Jeb Levering M.D.   On: 05/05/2016 01:12   Dg Chest Portable 1 View  Result Date: 05/04/2016 CLINICAL DATA:  Lethargy, AMS today per nursing home. Hx of HTN, PNA, stroke, CAD, premature atrial contraction. EXAM: PORTABLE CHEST 1 VIEW COMPARISON:  04/29/2016 FINDINGS: Cardiac silhouette is mildly enlarged. Small to moderate hiatal hernia is stable. No mediastinal or hilar masses. There is vascular congestion increased compared the prior  study. There is some increased opacity in the left lung base which may be due to atelectasis. Pneumonia is possible. No definite pleural effusion. No pneumothorax. Bony thorax is grossly intact. IMPRESSION: 1. Left lung base opacity which may reflect atelectasis. Pneumonia is possible but felt less likely. 2. Cardiomegaly and vascular congestion without overt pulmonary edema. Electronically Signed   By: Lajean Manes M.D.   On: 05/04/2016 18:14    Lovenia Kim, MD 05/05/2016, 12:55 PM PGY-1, [place] Intern pager: [phone], text pages welcome

## 2016-05-05 NOTE — Progress Notes (Signed)
Initial Nutrition Assessment  DOCUMENTATION CODES:   Non-severe (moderate) malnutrition in context of chronic illness  INTERVENTION:   -Ensure Enlive po BID, each supplement provides 350 kcal and 20 grams of protein  NUTRITION DIAGNOSIS:   Inadequate oral intake related to poor appetite, other (see comment) (limited food acceptance) as evidenced by meal completion < 50%, per patient/family report.  GOAL:   Patient will meet greater than or equal to 90% of their needs  MONITOR:   PO intake, Supplement acceptance, Labs, Skin, I & O's  REASON FOR ASSESSMENT:   Consult Assessment of nutrition requirement/status  ASSESSMENT:   [name]. is a 75 y.o. male presenting from [facility] via EMS with altered mental status thought to be secondary to hypoglycemia. PMH is significant for HTN, DM2, CAD, CVA, COPD, ETOH use, tobacco disorder, protein calorie malnutrition, dementia  Pt admitted with acute encephalopathy. He is a resident of [facility].   Hx obtained mainly from pt daughter at bedside. She shares that pt has hx of malnutrition, particularly several years ago prior to entering nursing facility. She endorses that pt has actually gained weight over the years; noted >20# wt gain over the past 1.5 years.   Pt is historically a very selective eater; per pt daughter, pt does not eat a lot of food off meal trays, but enjoys junk foods such as cookies. Pt also enjoys Ensure supplements and often receives them at the facility where he resides ("he'd drink a whole 6 pack if you'd let him"). Meal intake has been very variable; PO: 0-100%. Pt would benefit from Ensure supplements over Glucerna supplements, due to pt preference, as well as variable meal intake and hx of hypoglycemic episodes.   Nutrition-Focused physical exam completed. Findings are mild to moderate fat depletion, mild to moderate muscle depletion, and no edema. Per pt daughter, pt is mobile at facility, however,  needs assistance when walking the hallways.  Case discussed with RN, who confirmed hx provided by daughter. She reports blood sugars typically drop PTA due to minimal PO intake and insulin dosages.   Labs reviewed: K: 3.3, CBGS: 92-121. Noted two hypoglycemic events overnight, per RN documentation.   Labs reviewed: K: 3.3, CBGS: 92-121.   Diet Order:  Diet heart healthy/carb modified Room service appropriate? Yes; Fluid consistency: Thin  Skin:  Reviewed, no issues  Last BM:  05/04/16  Height:   Ht Readings from Last 1 Encounters:  05/04/16 5' 9.5" (1.765 m)    Weight:   Wt Readings from Last 1 Encounters:  05/04/16 151 lb 7.3 oz (68.7 kg)    Ideal Body Weight:  74.1 kg  BMI:  Body mass index is 22.05 kg/m.  Estimated Nutritional Needs:   Kcal:  1550-1750  Protein:  80-95 grams  Fluid:  1.6-1.8 L  EDUCATION NEEDS:   Education needs addressed  Seher Schlagel A. Jimmye Norman, RD, LDN, CDE Pager: [phone] After hours Pager: [phone]

## 2016-05-06 DIAGNOSIS — R112 Nausea with vomiting, unspecified: Secondary | ICD-10-CM

## 2016-05-06 DIAGNOSIS — E440 Moderate protein-calorie malnutrition: Secondary | ICD-10-CM

## 2016-05-06 DIAGNOSIS — E162 Hypoglycemia, unspecified: Secondary | ICD-10-CM

## 2016-05-06 DIAGNOSIS — J449 Chronic obstructive pulmonary disease, unspecified: Secondary | ICD-10-CM

## 2016-05-06 DIAGNOSIS — E43 Unspecified severe protein-calorie malnutrition: Secondary | ICD-10-CM

## 2016-05-06 LAB — CBC
HCT: 34 % — ABNORMAL LOW (ref 39.0–52.0)
Hemoglobin: 10.2 g/dL — ABNORMAL LOW (ref 13.0–17.0)
MCH: 23.8 pg — ABNORMAL LOW (ref 26.0–34.0)
MCHC: 30 g/dL (ref 30.0–36.0)
MCV: 79.4 fL (ref 78.0–100.0)
PLATELETS: 338 10*3/uL (ref 150–400)
RBC: 4.28 MIL/uL (ref 4.22–5.81)
RDW: 18.7 % — ABNORMAL HIGH (ref 11.5–15.5)
WBC: 9.9 10*3/uL (ref 4.0–10.5)

## 2016-05-06 LAB — BASIC METABOLIC PANEL
ANION GAP: 8 (ref 5–15)
BUN: 10 mg/dL (ref 6–20)
CALCIUM: 9.1 mg/dL (ref 8.9–10.3)
CO2: 25 mmol/L (ref 22–32)
Chloride: 104 mmol/L (ref 101–111)
Creatinine, Ser: 0.9 mg/dL (ref 0.61–1.24)
GFR calc non Af Amer: >60 mL/min (ref >60)
GLUCOSE: 147 mg/dL — AB (ref 65–99)
POTASSIUM: 3.5 mmol/L (ref 3.5–5.1)
SODIUM: 137 mmol/L (ref 135–145)

## 2016-05-06 LAB — HEMOGLOBIN A1C
HEMOGLOBIN A1C: 6.8 % — AB (ref 4.8–5.6)
Mean Plasma Glucose: 148 mg/dL

## 2016-05-06 LAB — GLUCOSE, CAPILLARY
GLUCOSE-CAPILLARY: 166 mg/dL — AB (ref 65–99)
GLUCOSE-CAPILLARY: 166 mg/dL — AB (ref 65–99)
GLUCOSE-CAPILLARY: 217 mg/dL — AB (ref 65–99)
Glucose-Capillary: 158 mg/dL — ABNORMAL HIGH (ref 65–99)
Glucose-Capillary: 103 mg/dL — ABNORMAL HIGH (ref 65–99)

## 2016-05-06 LAB — T4, FREE: FREE T4: 0.8 ng/dL (ref 0.61–1.12)

## 2016-05-06 LAB — RPR: RPR: NONREACTIVE

## 2016-05-06 LAB — HIV ANTIBODY (ROUTINE TESTING W REFLEX): HIV SCREEN 4TH GENERATION: NONREACTIVE

## 2016-05-06 LAB — FOLATE: Folate: 77.4 ng/mL (ref >5.9)

## 2016-05-06 MED ORDER — BACLOFEN 10 MG PO TABS
10.0000 mg | ORAL_TABLET | Freq: Two times a day (BID) | ORAL | Status: DC
  Administered 2016-05-06 – 2016-05-07 (×3): 10 mg via ORAL
  Filled 2016-05-06 (×3): qty 1

## 2016-05-06 MED ORDER — LOSARTAN POTASSIUM 50 MG PO TABS
25.0000 mg | ORAL_TABLET | Freq: Every day | ORAL | Status: DC
  Administered 2016-05-06 – 2016-05-07 (×2): 25 mg via ORAL
  Filled 2016-05-06 (×2): qty 1

## 2016-05-06 MED ORDER — LEVOFLOXACIN 750 MG PO TABS: 750.0000 mg | ORAL_TABLET | Freq: Every day | ORAL | 0 refills | Status: DC

## 2016-05-06 MED ORDER — ACETAMINOPHEN 500 MG PO TABS
500.0000 mg | ORAL_TABLET | Freq: Four times a day (QID) | ORAL | Status: DC | PRN
  Administered 2016-05-06: 500 mg via ORAL
  Filled 2016-05-06: qty 1

## 2016-05-06 MED ORDER — INSULIN ASPART 100 UNIT/ML ~~LOC~~ SOLN: SUBCUTANEOUS | 11 refills | Status: DC

## 2016-05-06 MED ORDER — ENSURE ENLIVE PO LIQD: 237.0000 mL | Freq: Two times a day (BID) | ORAL | 12 refills | Status: DC

## 2016-05-06 NOTE — Progress Notes (Signed)
Family Medicine Teaching Service Daily Progress Note Intern Pager: [phone]  Patient name: [name]. Medical record number: [id number] Date of birth: Feb 04, 1941 Age: 75 y.o. Gender: male  Primary Care Provider: Elizabeth Palau, MD (Inactive) Consultants: None Code Status: FULL  Pt Overview and Major Events to Date:  Levaquin start date 8/4  Assessment and Plan:  Adiel Horng. is a 75 y.o. male presenting from [facility] via EMS with altered mental status thought to be secondary to hypoglycemia. PMH is significant for HTN, DM2, CAD, CVA, COPD, ETOH use, tobacco disorder, protein calorie malnutrition, dementia  Acute Encephalopathy: BG 48 per EMS, given D10.  BG noted to be in 60s in ED and was placed on D10 infusion.  Recently treated for UTI in ED.  He was noted to have a bladder mass on CT renal stone study 04/29/16.  UA with persistent large hgb, moderate leuks, rare bacteria. Initial CXR revealed new left lung base opacity concerning for atelectasis vs pna. Patient initially with new O2 requirement, needing 2L [place], for O2 saturation 88% on RA. Now maintaining SpO2 above 90% on RA. He is afebrile, VSS.  No leukocytosis. Daughter Kalman Shan contacted and updated about her father's admission. Code status verified: Full code. Baseline function verified with her. On exam appears to be close to baseline, oriented to person and time, not oriented to place. Otherwise, holding basic conversation and responding appropriately to questions.  - Admitted to telemetry under Dr Mingo Amber - VS per floor protocol - O2 via [place] prn to maintain sats >92% - CBGs stable 90s to high 100s - Empiric abx with Levaquin started 8/3. Renal function improved, Pharm recc switch from Q48 to Q24. Day 3 of 5 (today is 3rd dose). - Urine culture no growth (final) - Blood culture no growth < 12 hrs - am CBC hgb 10.2, BMP K+ 3.5 rest wnl - Negative for ASA, Tylenol and ETOH -UDS: Negative - CT head w/o contrast: No  intracranial hemorrhage, unchanged chronic small vessel ischemia, remote lacunar infarcts -CXR: Left lung base opacity, may reflect atelectasis vs PNA. PNA possible but felt less likely, vascular congestion without overt pulmonary edema.  -2view CXR ordered due to unclear imaging: Bibasilar atelectasis noted.  - Neuro checks routine -Additional labs ordered for workup 8/4, reviewed today:  -Thyroid TSH 0.256 --> ordered free T3, T4 -B12 786 -Folate -- needs to be ordered -RPR NR -HIV NR  AKI: Resolved. Cr 1.57 on admission, Baseline 0.6-0.9. Cachetic so this is significant. - s/p 1L bolus - Continue MIVF - Avoid nephrotoxic agents - BMP 8/5 --> 0.90  # Hypokalemia: Resolved. Initial K 3.1. BG 70 at that time.  Repeat BMP in am K+ 3.3 40 mEq Kdur given. - K 3.5 8/5 - Mg level 1.7 wnl  # DM2: s/p 2 hypoglycemic episodes, now maintaining CBGs in 100s. Hgb A1c 6.8.  - MIVFs - Monitor BG q6 - SSI sensitive --> 5u novolog given 8/4  # HTN/ CAD: BP 164/99 on admission. Systolic BP in the [id number] overnight. On Norvasc and Cozaar at home - Restart Cozaar - Continue Norvasc in am - will d/c IVFs as eating well - unsure as to why he is not on a BB if has h/o CAD.  # h/o CVA. On Zocor 5.  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 - Continue statin - Restart home baclofen for complaint of contracture - PT/OT consults  # COPD - Monitor - O2 prn as above  # Protein calorie malnutrition -  Nutrition consulted  for assessment and recs --> Ensure BID  FEN/GI: Heart healthy Prophylaxis: Lovenox 40 Qdaily, dc'ed IVFs  Disposition: ALF vs SNF pending PT evaluation; will transfer out of SDU if not discharged today  Subjective:  Patient with no complaints this morning. He says he feels well and would like to go back to his ALF. He denies SOB, chest pain. He is craving cigarettes and was interested in getting a nicotine patch.   Objective: Temp:  [98.2 F (36.8 C)-98.9 F (37.2 C)]  98.3 F (36.8 C) (08/05 0811) Pulse Rate:  [76-113] 95 (08/05 0811) Resp:  [16-25] 22 (08/05 0811) BP: (120-175)/(77-94) 159/90 (08/05 0811) SpO2:  [90 %-98 %] 94 % (08/05 0811)    Physical Exam:  General: awake, alert and oriented x2 (not to year, knew [facility] but not hospital name), sitting up in bed eating breakfast Eyes: arcus senilis bilaterally, sclera white, EOMI ENTM: MMM, poor dentition Cardiovascular: RRR, no murmurs appreciated on exam Respiratory: decreased breath sounds throughout, otherwise CTAB, no crackles/ wheeze, no increased WOB on RA Abdomen: soft, NT/ND, +BS MSK: thin, poor tone, LUE with contracture, no edema, diminished pulses b/l Neuro: follows some commands (opens eyes, wiggles toes); speech relatively clear Psych: normal mood and affect  Laboratory:  Recent Labs Lab 05/04/16 1555 05/05/16 0358 05/06/16 0505  WBC 10.1 9.9 9.9  HGB 10.1* 9.8* 10.2*  HCT 33.2* 32.7* 34.0*  PLT 369 338 338    Recent Labs Lab 04/29/16 1513 05/04/16 1555 05/05/16 0358 05/06/16 0505  NA 138 139 139 137  K 4.1 3.1* 3.3* 3.5  CL 106 107 107 104  CO2 26 22 25 25   BUN 14 24* 15 10  CREATININE 0.93 1.57* 1.05 0.90  CALCIUM 9.2 9.1 8.6* 9.1  PROT 7.7 6.6  --   --   BILITOT 0.5 0.2*  --   --   ALKPHOS 98 81  --   --   ALT 13* 12*  --   --   AST 18 21  --   --   GLUCOSE 179* 70 88 147*     Imaging/Diagnostic Tests:  Dg Chest 2 View  Result Date: 05/05/2016 CLINICAL DATA:  Hypoxia. EXAM: CHEST  2 VIEW COMPARISON:  05/04/2016 FINDINGS: Mild cardiomegaly. Linear densities in the lung bases, likely atelectasis. No visible effusions. No acute bony abnormality. IMPRESSION: Cardiomegaly.  Bibasilar atelectasis. Electronically Signed   By: Rolm Baptise M.D.   On: 05/05/2016 11:03    Rogue Bussing, MD 05/06/2016, 8:20 AM PGY-1, [place] Intern pager: [phone], text pages welcome

## 2016-05-06 NOTE — Progress Notes (Signed)
[name] and was informed patient could not be accepted over the weekend because he had been admitted for more than 24 hours, medication changes were made, and no administrators are present over the weekend to review medication changes. Began process of trying to discharge around 1640 after patient had been cleared by PT and could not reach Social Work. Tried reaching SW in ED, as well. Updated daughter of this change. Will contact SW during business hours tomorrow to determine disposition options.

## 2016-05-06 NOTE — Progress Notes (Signed)
Report given to [place] on 5W. Patient daughter [name] made aware of patients new room # and transfer to [phone].
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
## 2016-05-07 DIAGNOSIS — R112 Nausea with vomiting, unspecified: Secondary | ICD-10-CM

## 2016-05-07 LAB — BASIC METABOLIC PANEL
Anion gap: 7 (ref 5–15)
BUN: 8 mg/dL (ref 6–20)
CALCIUM: 9.3 mg/dL (ref 8.9–10.3)
CHLORIDE: 104 mmol/L (ref 101–111)
CO2: 26 mmol/L (ref 22–32)
CREATININE: 0.88 mg/dL (ref 0.61–1.24)
GFR calc Af Amer: >60 mL/min (ref >60)
GFR calc non Af Amer: >60 mL/min (ref >60)
Glucose, Bld: 152 mg/dL — ABNORMAL HIGH (ref 65–99)
Potassium: 3.5 mmol/L (ref 3.5–5.1)
Sodium: 137 mmol/L (ref 135–145)

## 2016-05-07 LAB — MRSA CULTURE

## 2016-05-07 LAB — GLUCOSE, CAPILLARY
GLUCOSE-CAPILLARY: 193 mg/dL — AB (ref 65–99)
Glucose-Capillary: 151 mg/dL — ABNORMAL HIGH (ref 65–99)
Glucose-Capillary: 164 mg/dL — ABNORMAL HIGH (ref 65–99)
Glucose-Capillary: 181 mg/dL — ABNORMAL HIGH (ref 65–99)
Glucose-Capillary: 274 mg/dL — ABNORMAL HIGH (ref 65–99)

## 2016-05-07 LAB — T3, FREE: T3, Free: 2.5 pg/mL (ref 2.0–4.4)

## 2016-05-07 MED ORDER — ACETAMINOPHEN 500 MG PO TABS: 500.0000 mg | ORAL_TABLET | Freq: Four times a day (QID) | ORAL | 0 refills | Status: DC | PRN

## 2016-05-07 MED ORDER — GI COCKTAIL ~~LOC~~
30.0000 mL | Freq: Once | ORAL | Status: AC
  Administered 2016-05-07: 30 mL via ORAL
  Filled 2016-05-07: qty 30

## 2016-05-07 MED ORDER — PROMETHAZINE HCL 25 MG/ML IJ SOLN
12.5000 mg | Freq: Once | INTRAMUSCULAR | Status: AC
Start: 2016-05-07 — End: 2016-05-07
  Administered 2016-05-07: 12.5 mg via INTRAVENOUS
  Filled 2016-05-07: qty 1

## 2016-05-07 MED ORDER — LEVOFLOXACIN 750 MG PO TABS: 750.0000 mg | ORAL_TABLET | Freq: Every day | ORAL | 0 refills | Status: AC

## 2016-05-07 NOTE — Progress Notes (Signed)
Pt was throwing up when his transportation arrived. I paged the doctor Mingo Amber) she called and after patient stated he was just a little nauseous and no longer felt the urge to vomit...she determined it was still ok for him to go.

## 2016-05-07 NOTE — NC FL2 (Signed)
[place] LEVEL OF CARE SCREENING TOOL     IDENTIFICATION  Patient Name: [name]. Birthdate: 1941-09-19 Sex: male Admission Date (Current Location): 05/04/2016  [place] and [place] Number:  Kathleen Argue [id number] [place] and Address:  [facility], [place] [address], [place], [place] [address]      Provider Number: [id number]  Attending Physician Name and Address:  Alveda Reasons, MD  Relative Name and Phone Number:       Current Level of Care: Hospital Recommended Level of Care: [place] Prior Approval Number:    Date Approved/Denied:   PASRR Number:    Discharge Plan: Domiciliary (Rest home)    Current Diagnoses: Patient Active Problem List   Diagnosis Date Noted  . Hypoglycemia   . Malnutrition of moderate degree 05/05/2016  . AKI (acute kidney injury) ([place]) 05/04/2016  . Hypokalemia 05/04/2016  . Edema 11/02/2014  . Cellulitis of leg, left 11/02/2014  . DM (diabetes mellitus), type 2, uncontrolled ([place]) 10/08/2014  . COPD, severity to be determined ([place]) 10/08/2014  . Severe protein-calorie malnutrition ([place]) 10/08/2014  . History of CVA (cerebrovascular accident) 10/08/2014  . 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 Encephalopathy   . Essential hypertension   . Altered mental status   . Pain of left upper extremity   . 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 CAD (coronary artery disease) 05/04/2013  . GI bleeding 05/04/2013  . Abdominal pain, other specified site 12/12/2012  . H/O medication noncompliance 09/03/2012  . Tobacco and Alcohol use 12/02/2011    Orientation RESPIRATION BLADDER Height & Weight     Self, Situation, Place  Normal Continent Weight: 151 lb 7.3 oz (68.7 kg) Height:  5' 9.5" (176.5 cm)  BEHAVIORAL SYMPTOMS/MOOD NEUROLOGICAL BOWEL NUTRITION STATUS      Continent Diet (Heart healthy/carb modified)  AMBULATORY STATUS COMMUNICATION OF NEEDS Skin   Limited Assist Verbally  Normal                       Personal Care Assistance Level of Assistance  Bathing, Feeding, Dressing Bathing Assistance: Limited assistance Feeding assistance: Independent Dressing Assistance: Limited assistance     Functional Limitations Info  Sight, Hearing, Speech Sight Info: Adequate Hearing Info: Adequate Speech Info: Adequate    SPECIAL CARE FACTORS FREQUENCY                       Contractures Contractures Info: Not present    Additional Factors Info  Code Status, Allergies, Psychotropic, Insulin Sliding Scale Code Status Info: Full Allergies Info: NKDA Psychotropic Info: Effexor Insulin Sliding Scale Info: 3 times daily        Current Medications (05/07/2016):  This is the current hospital active medication list Current Facility-Administered Medications  Medication Dose Route Frequency Provider Last Rate Last Dose  . acetaminophen (TYLENOL) tablet 500 mg  500 mg Oral Q6H PRN Rogue Bussing, MD   500 mg at 05/06/16 1155  . amLODipine (NORVASC) tablet 2.5 mg  2.5 mg Oral Daily Ashly M Gottschalk, DO   2.5 mg at 05/07/16 1015  . antiseptic oral rinse (CPC / CETYLPYRIDINIUM CHLORIDE 0.05%) solution 7 mL  7 mL Mouth Rinse BID Alveda Reasons, MD   7 mL at 05/07/16 1015  . baclofen (LIORESAL) tablet 10 mg  10 mg Oral BID Bufford Lope, DO   10 mg at 05/07/16 1015  . enoxaparin (LOVENOX) injection 40 mg  40 mg Subcutaneous Q24H Smiley Houseman, MD   40 mg at 05/06/16 1155  . feeding supplement (ENSURE ENLIVE) (ENSURE ENLIVE) liquid 237 mL  237 mL Oral BID BM Alveda Reasons, MD   237 mL at 05/07/16 1013  . folic acid (FOLVITE) tablet 1 mg  1 mg Oral Daily Ashly M Gottschalk, DO   1 mg at 05/07/16 1014  . gi cocktail (Maalox,Lidocaine,Donnatal)  30 mL Oral Once Rogue Bussing, MD      . insulin aspart (novoLOG) injection 0-9 Units  0-9 Units Subcutaneous TID WC Janora Norlander, DO   2 Units at 05/07/16 1013  . levofloxacin (LEVAQUIN)  tablet 750 mg  750 mg Oral Q24H Lovenia Kim, MD   750 mg at 05/07/16 1013  . losartan (COZAAR) tablet 25 mg  25 mg Oral Daily Rogue Bussing, MD   25 mg at 05/07/16 1014  . promethazine (PHENERGAN) injection 12.5 mg  12.5 mg Intravenous Once Rogue Bussing, MD      . simvastatin (ZOCOR) tablet 5 mg  5 mg Oral QHS Janora Norlander, DO   5 mg at 05/06/16 2128  . sodium chloride flush (NS) 0.9 % injection 3 mL  3 mL Intravenous Q12H Ashly M Gottschalk, DO   3 mL at 05/07/16 1017  . thiamine (VITAMIN B-1) tablet 100 mg  100 mg Oral Daily Ashly M Gottschalk, DO   100 mg at 05/07/16 1013  . venlafaxine XR (EFFEXOR-XR) 24 hr capsule 150 mg  150 mg Oral Q breakfast Ashly M Gottschalk, DO   150 mg at 05/07/16 0846     Discharge Medications: TAKE these medications   acetaminophen 500 MG tablet Commonly known as:  TYLENOL Take 1 tablet (500 mg total) by mouth every 6 (six) hours as needed for mild pain or moderate pain.  amLODipine 2.5 MG tablet Commonly known as:  NORVASC Take 2.5 mg by mouth daily.  ARIPiprazole 2 MG tablet Commonly known as:  ABILIFY Take 2 mg by mouth daily.  aspirin 81 MG chewable tablet Chew 81 mg by mouth daily.  baclofen 10 MG tablet Commonly known as:  LIORESAL Take 10 mg by mouth 2 (two) times daily.  famotidine 20 MG tablet Commonly known as:  PEPCID Take 20 mg by mouth 2 (two) times daily.  feeding supplement (ENSURE ENLIVE) Liqd Take 237 mLs by mouth 2 (two) times daily between meals.  folic acid 1 MG tablet Commonly known as:  FOLVITE Take 1 tablet (1 mg total) by mouth daily.  furosemide 40 MG tablet Commonly known as:  LASIX Take 40 mg by mouth.  gabapentin 300 MG capsule Commonly known as:  NEURONTIN Take 300 mg by mouth 3 (three) times daily.  insulin aspart 100 UNIT/ML injection Commonly known as:  novoLOG CBG < 70:              implement hypoglycemia protocol              CBG 70 - 120:            0 units                CBG 121 -  150:                       1 unit                  CBG 151 - 200:  2 units                CBG 201 - 250:                       3 units                CBG 251 - 300:                       5 units                CBG 301 - 350:                       7 units                CBG 351 - 400           9 units                CBG > 400                  call MD and obtain STAT lab verification  levofloxacin 750 MG tablet Commonly known as:  LEVAQUIN Take 1 tablet (750 mg total) by mouth daily.  losartan 25 MG tablet Commonly known as:  COZAAR Take 25 mg by mouth daily.  Melatonin 3 MG Tabs Take 3 mg by mouth at bedtime.  metFORMIN 500 MG tablet Commonly known as:  GLUCOPHAGE Take 1 tablet (500 mg total) by mouth 2 (two) times daily with a meal.  nitroGLYCERIN 0.4 MG SL tablet Commonly known as:  NITROSTAT Place 1 tablet (0.4 mg total) under the tongue every 5 (five) minutes as needed for chest pain.  omeprazole 20 MG capsule Commonly known as:  PRILOSEC Take 20 mg by mouth daily.  OVER THE COUNTER MEDICATION Take 1 Can by mouth 3 (three) times daily. Mighty Shakes  potassium chloride 10 MEQ tablet Commonly known as:  K-DUR,KLOR-CON Take 1 tablet (10 mEq total) by mouth daily.  simvastatin 5 MG tablet Commonly known as:  ZOCOR Take 1 tablet (5 mg total) by mouth at bedtime.  thiamine 100 MG tablet Take 1 tablet (100 mg total) by mouth daily.  venlafaxine XR 150 MG 24 hr capsule Commonly known as:  EFFEXOR-XR Take 150 mg by mouth daily with breakfast.  Vitamin D 2000 units Caps Take 2,000 Units by mouth daily.       Relevant Imaging Results:  Relevant Lab Results:   Additional [place], MD [phone]  Reviewed and agree with above. Olene Floss, MD, [place], PGY-2

## 2016-05-07 NOTE — Progress Notes (Signed)
Pt HR increases with ambulation. Few occurences of tachycardia during night while pt was ambulating.

## 2016-05-07 NOTE — Clinical Social Work Note (Signed)
Clinical Social Work Assessment  Patient Details  Name: [name]. MRN: [id number] Date of Birth: 10-11-1940  Date of referral:  05/07/16               Reason for consult:  Facility Placement                Permission sought to share information with:  Family Supports Permission granted to share information::  Yes, Verbal Permission Granted  Name::     Freight forwarder::     Relationship::  Dtr  Contact Information:     Housing/Transportation Living arrangements for the past 2 months:  [place] of Information:  Patient Patient Interpreter Needed:  None Criminal Activity/Legal Involvement Pertinent to Current Situation/Hospitalization:  No - Comment as needed Significant Relationships:  Adult Children Lives with:  Facility Resident Do you feel safe going back to the place where you live?  Yes Need for family participation in patient care:  Yes (Comment)  Care giving concerns:  ALF resident    Social Worker assessment / plan:  Pt is resident at [facility] and is ready to return. CSW completed FL2 and sent information along with DC summary to ALF to review. CSW spoke with [place] who reviewed DC info and reports pt can return. CSW informed pt and dtr of DC plans and they requested PTAR for transportation. DC packet arranged with FL2, DC summary, face sheet and PTAR forms. CSW arranged for transportation and signed off.   Employment status:  Retired Forensic scientist:  Information systems manager, Medicaid In [place] PT Recommendations:  No Follow Up Information / Referral to community resources:  Other (Comment Required) (ALF)  Patient/Family's Response to care:  Agreeable to return to ALF.  Patient/Family's Understanding of and Emotional Response to Diagnosis, Current Treatment, and Prognosis:  Pt is to return to ALF today.   Emotional Assessment Appearance:  Appears stated age Attitude/Demeanor/Rapport:  Other (Appropriate) Affect (typically observed):   Appropriate Orientation:  Oriented to Self, Oriented to Place, Oriented to  Time Alcohol / Substance use:  Not Applicable Psych involvement (Current and /or in the community):  No (Comment)  Discharge Needs  Concerns to be addressed:  No discharge needs identified Readmission within the last 30 days:  No Current discharge risk:  None Barriers to Discharge:  No Barriers Identified   Boone Master, [place] 05/07/2016, 3:25 PM Weekend Coverage

## 2016-05-07 NOTE — Progress Notes (Signed)
Family Medicine Teaching Service Daily Progress Note Intern Pager: [phone]  Patient name: [name]. Medical record number: [id number] Date of birth: 1941/03/30 Age: 75 y.o. Gender: male  Primary Care Provider: Elizabeth Palau, MD (Inactive) Consultants: None Code Status: FULL  Pt Overview and Major Events to Date:  Levaquin start date 8/4  Assessment and Plan: [name]. is a 75 y.o. male presenting from [facility] via EMS with altered mental status thought to be secondary to hypoglycemia. PMH is significant for HTN, DM2, CAD, CVA, COPD, ETOH use, tobacco disorder, protein calorie malnutrition, dementia  Acute Encephalopathy: BG 48 per EMS, given D10.  BG noted to be in 60s in ED and was placed on D10 infusion.  Recently treated for UTI in ED.  He was noted to have a bladder mass on CT renal stone study 04/29/16. UA with persistent large hgb, moderate leuks, rare bacteria. Initial CXR revealed new left lung base opacity concerning for atelectasis vs pna. Patient initially with new O2 requirement, needing 2L [place], for O2 saturation 88% on RA. Now maintaining SpO2 above 92% on RA. He is afebrile, VSS.  No leukocytosis. Daughter Kalman Shan contacted and updated about her father's admission. Code status verified: Full code. Baseline function verified with her. On exam appears to be close to baseline, oriented to person and time, not oriented to place. Otherwise, holding basic conversation and responding appropriately to questions.  - Admitted to telemetry under Dr Mingo Amber - VS per floor protocol - O2 via [place] prn to maintain sats >92% - CBGs stable 90s to high 100s - Empiric abx with Levaquin started 8/3. Renal function improved, Pharm recc switch from Q48 to Q24. Day 4 of 5 (today is 4th dose). - Urine culture no growth (final) - Blood culture no growth x 1 day - Negative for ASA, Tylenol and ETOH -UDS: Negative - CT head w/o contrast: No intracranial hemorrhage, unchanged chronic small  vessel ischemia, remote lacunar infarcts -CXR: Left lung base opacity, may reflect atelectasis vs PNA. PNA possible but felt less likely, vascular congestion without overt pulmonary edema.  - 2 view CXR ordered due to unclear imaging: Bibasilar atelectasis noted.  -Additional labs ordered for workup 8/4, reviewed again today:  -Thyroid TSH 0.256 --> free T3 was 2.5, T4 was 0.80 (both WNL) -B12 786 -Folate -- 77.4 WNL -RPR NR -HIV NR  Nausea: Began last night. May be due to lack of tobacco. CBGs not low or high. No abdominal pain. No fever or leukocytosis to suggest infection. - try phenergan (QTc prolonged at 451 but anticipate need for only 1 dose) - GI cocktail  AKI: Resolved. Cr 1.57 on admission, Baseline 0.6-0.9. Cachetic so this is significant. - s/p 1L bolus - Continue MIVF - Avoid nephrotoxic agents - BMP 8/6 --> 0.88  # Hypokalemia: Resolved. Initial K 3.1. BG 70 at that time.  Repeat BMP in am K+ 3.3 40 mEq Kdur given. - K 3.5 8/6 - Mg level 1.7 wnl  # DM2: s/p 2 hypoglycemic episodes, now maintaining CBGs in 100s. Hgb A1c 6.8.  - MIVFs - Monitor BG qAC QHS - SSI sensitive --> 4u novolog given 8/5  # HTN/ CAD: BP 164/99 on admission. AM BP 143/95.  - Continue Cozaar and Norvasc - Plan to restart lasix upon discharge - unsure as to why he is not on a BB if has h/o CAD.  # h/o CVA. On Zocor 5.  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 - Continue statin - Restart  home baclofen for complaint of contracture - PT did not recommend higher level of care than ALF  # COPD - Monitor  # Protein calorie malnutrition - Nutrition consulted  for assessment and recs --> Ensure BID  FEN/GI: Heart healthy Prophylaxis: Lovenox 40 Qdaily  Disposition: ALF once patient tolerates meal today  Subjective:  Patient complains of nausea since last night. Was not able to eat dinner or breakfast. Denies abdominal pain. Denies SOB.   Objective: Temp:  [97.6 F (36.4 C)-99 F (37.2  C)] 98.2 F (36.8 C) (08/06 0549) Pulse Rate:  [95-98] 98 (08/06 0549) Resp:  [15-17] 16 (08/06 0549) BP: (140-172)/(80-99) 143/95 (08/06 0549) SpO2:  [94 %-98 %] 94 % (08/06 0549)    Physical Exam:  General: awake, sitting up in bed Eyes: arcus senilis bilaterally, sclera white, PERRLA ENTM: MMM, poor dentition Cardiovascular: RRR, no murmurs appreciated on exam Respiratory: decreased breath sounds throughout, otherwise CTAB, no crackles/ wheeze, no increased WOB on RA Abdomen: soft, NT/ND, +BS MSK: thin, poor tone, LUE with contracture, no edema, diminished pulses b/l Neuro: AOx3 today, follows some commands (opens eyes, wiggles toes); speech clear Psych: normal mood and affect  Laboratory:  Recent Labs Lab 05/04/16 1555 05/05/16 0358 05/06/16 0505  WBC 10.1 9.9 9.9  HGB 10.1* 9.8* 10.2*  HCT 33.2* 32.7* 34.0*  PLT 369 338 338    Recent Labs Lab 05/04/16 1555 05/05/16 0358 05/06/16 0505 05/07/16 0656  NA 139 139 137 137  K 3.1* 3.3* 3.5 3.5  CL 107 107 104 104  CO2 22 25 25 26   BUN 24* 15 10 8   CREATININE 1.57* 1.05 0.90 0.88  CALCIUM 9.1 8.6* 9.1 9.3  PROT 6.6  --   --   --   BILITOT 0.2*  --   --   --   ALKPHOS 81  --   --   --   ALT 12*  --   --   --   AST 21  --   --   --   GLUCOSE 70 88 147* 152*     Imaging/Diagnostic Tests:  No results found.  [place] Bussing, MD 05/07/2016, 11:39 AM PGY-2, [place] Intern pager: [phone], text pages welcome

## 2016-05-08 LAB — BLOOD CULTURE ID PANEL (REFLEXED)
Acinetobacter baumannii: NOT DETECTED
CANDIDA ALBICANS: NOT DETECTED
CANDIDA GLABRATA: NOT DETECTED
CANDIDA PARAPSILOSIS: NOT DETECTED
CANDIDA TROPICALIS: NOT DETECTED
Candida krusei: NOT DETECTED
Carbapenem resistance: NOT DETECTED
ENTEROBACTER CLOACAE COMPLEX: NOT DETECTED
ESCHERICHIA COLI: NOT DETECTED
Enterobacteriaceae species: NOT DETECTED
Enterococcus species: NOT DETECTED
Haemophilus influenzae: NOT DETECTED
KLEBSIELLA OXYTOCA: NOT DETECTED
KLEBSIELLA PNEUMONIAE: NOT DETECTED
Listeria monocytogenes: NOT DETECTED
METHICILLIN RESISTANCE: NOT DETECTED
Neisseria meningitidis: NOT DETECTED
PROTEUS SPECIES: NOT DETECTED
Pseudomonas aeruginosa: NOT DETECTED
STREPTOCOCCUS PNEUMONIAE: NOT DETECTED
Serratia marcescens: NOT DETECTED
Staphylococcus aureus (BCID): NOT DETECTED
Staphylococcus species: NOT DETECTED
Streptococcus agalactiae: NOT DETECTED
Streptococcus pyogenes: NOT DETECTED
Streptococcus species: NOT DETECTED
Vancomycin resistance: NOT DETECTED

## 2016-05-10 ENCOUNTER — Encounter: Payer: Self-pay | Admitting: Emergency Medicine

## 2016-05-10 ENCOUNTER — Encounter (HOSPITAL_COMMUNITY): Payer: Self-pay | Admitting: Radiology

## 2016-05-10 ENCOUNTER — Emergency Department (HOSPITAL_COMMUNITY): Payer: Medicare Other

## 2016-05-10 ENCOUNTER — Inpatient Hospital Stay (HOSPITAL_COMMUNITY)
Admission: EM | Admit: 2016-05-10 | Discharge: 2016-05-15 | DRG: 871 | Disposition: A | Discharge status: Skilled Nursing Facility | Payer: Medicare Other | Attending: Family Medicine | Admitting: Family Medicine

## 2016-05-10 ENCOUNTER — Other Ambulatory Visit (HOSPITAL_COMMUNITY): Payer: Self-pay | Admitting: Radiology

## 2016-05-10 DIAGNOSIS — I2510 Atherosclerotic heart disease of native coronary artery without angina pectoris: Secondary | ICD-10-CM | POA: Diagnosis present

## 2016-05-10 DIAGNOSIS — Z6824 Body mass index (BMI) 24.0-24.9, adult: Secondary | ICD-10-CM

## 2016-05-10 DIAGNOSIS — T68XXXA Hypothermia, initial encounter: Secondary | ICD-10-CM

## 2016-05-10 DIAGNOSIS — N179 Acute kidney failure, unspecified: Secondary | ICD-10-CM | POA: Diagnosis present

## 2016-05-10 DIAGNOSIS — S0101XD Laceration without foreign body of scalp, subsequent encounter: Secondary | ICD-10-CM | POA: Diagnosis not present

## 2016-05-10 DIAGNOSIS — J449 Chronic obstructive pulmonary disease, unspecified: Secondary | ICD-10-CM | POA: Diagnosis not present

## 2016-05-10 DIAGNOSIS — E46 Unspecified protein-calorie malnutrition: Secondary | ICD-10-CM | POA: Diagnosis present

## 2016-05-10 DIAGNOSIS — D62 Acute posthemorrhagic anemia: Secondary | ICD-10-CM | POA: Diagnosis present

## 2016-05-10 DIAGNOSIS — F0390 Unspecified dementia without behavioral disturbance: Secondary | ICD-10-CM | POA: Diagnosis not present

## 2016-05-10 DIAGNOSIS — E119 Type 2 diabetes mellitus without complications: Secondary | ICD-10-CM | POA: Diagnosis not present

## 2016-05-10 DIAGNOSIS — S0101XA Laceration without foreign body of scalp, initial encounter: Secondary | ICD-10-CM

## 2016-05-10 DIAGNOSIS — E1149 Type 2 diabetes mellitus with other diabetic neurological complication: Secondary | ICD-10-CM

## 2016-05-10 DIAGNOSIS — W010XXA Fall on same level from slipping, tripping and stumbling without subsequent striking against object, initial encounter: Secondary | ICD-10-CM | POA: Diagnosis present

## 2016-05-10 DIAGNOSIS — E43 Unspecified severe protein-calorie malnutrition: Secondary | ICD-10-CM | POA: Diagnosis present

## 2016-05-10 DIAGNOSIS — E860 Dehydration: Secondary | ICD-10-CM | POA: Diagnosis not present

## 2016-05-10 DIAGNOSIS — E162 Hypoglycemia, unspecified: Secondary | ICD-10-CM | POA: Diagnosis not present

## 2016-05-10 DIAGNOSIS — N390 Urinary tract infection, site not specified: Secondary | ICD-10-CM | POA: Diagnosis present

## 2016-05-10 DIAGNOSIS — F17210 Nicotine dependence, cigarettes, uncomplicated: Secondary | ICD-10-CM | POA: Diagnosis present

## 2016-05-10 DIAGNOSIS — I10 Essential (primary) hypertension: Secondary | ICD-10-CM | POA: Diagnosis present

## 2016-05-10 DIAGNOSIS — I69354 Hemiplegia and hemiparesis following cerebral infarction affecting left non-dominant side: Secondary | ICD-10-CM | POA: Diagnosis not present

## 2016-05-10 DIAGNOSIS — R55 Syncope and collapse: Secondary | ICD-10-CM | POA: Diagnosis not present

## 2016-05-10 DIAGNOSIS — D649 Anemia, unspecified: Secondary | ICD-10-CM

## 2016-05-10 DIAGNOSIS — Z794 Long term (current) use of insulin: Secondary | ICD-10-CM | POA: Diagnosis not present

## 2016-05-10 DIAGNOSIS — W19XXXD Unspecified fall, subsequent encounter: Secondary | ICD-10-CM | POA: Diagnosis not present

## 2016-05-10 DIAGNOSIS — Z79899 Other long term (current) drug therapy: Secondary | ICD-10-CM | POA: Diagnosis not present

## 2016-05-10 DIAGNOSIS — A419 Sepsis, unspecified organism: Principal | ICD-10-CM | POA: Diagnosis present

## 2016-05-10 DIAGNOSIS — F329 Major depressive disorder, single episode, unspecified: Secondary | ICD-10-CM | POA: Diagnosis present

## 2016-05-10 DIAGNOSIS — E440 Moderate protein-calorie malnutrition: Secondary | ICD-10-CM | POA: Insufficient documentation

## 2016-05-10 DIAGNOSIS — W19XXXA Unspecified fall, initial encounter: Secondary | ICD-10-CM

## 2016-05-10 HISTORY — DX: Type 2 diabetes mellitus without complications: E11.9

## 2016-05-10 LAB — COMPREHENSIVE METABOLIC PANEL
ALBUMIN: 2.8 g/dL — AB (ref 3.5–5.0)
ALK PHOS: 64 U/L (ref 38–126)
ALT: 20 U/L (ref 17–63)
AST: 31 U/L (ref 15–41)
Anion gap: 11 (ref 5–15)
BILIRUBIN TOTAL: 0.5 mg/dL (ref 0.3–1.2)
BUN: 47 mg/dL — AB (ref 6–20)
CALCIUM: 8.8 mg/dL — AB (ref 8.9–10.3)
CO2: 19 mmol/L — ABNORMAL LOW (ref 22–32)
Chloride: 105 mmol/L (ref 101–111)
Creatinine, Ser: 3.66 mg/dL — ABNORMAL HIGH (ref 0.61–1.24)
GFR calc Af Amer: 17 mL/min — ABNORMAL LOW (ref >60)
GFR, EST NON AFRICAN AMERICAN: 15 mL/min — AB (ref >60)
GLUCOSE: 296 mg/dL — AB (ref 65–99)
POTASSIUM: 4.8 mmol/L (ref 3.5–5.1)
Sodium: 135 mmol/L (ref 135–145)
TOTAL PROTEIN: 6 g/dL — AB (ref 6.5–8.1)

## 2016-05-10 LAB — RAPID URINE DRUG SCREEN, HOSP PERFORMED
AMPHETAMINES: NOT DETECTED
BARBITURATES: NOT DETECTED
BENZODIAZEPINES: NOT DETECTED
Cocaine: NOT DETECTED
Opiates: POSITIVE — AB
Tetrahydrocannabinol: NOT DETECTED

## 2016-05-10 LAB — CBC WITH DIFFERENTIAL/PLATELET
BASOS ABS: 0 10*3/uL (ref 0.0–0.1)
BASOS PCT: 0 %
EOS PCT: 0 %
Eosinophils Absolute: 0 10*3/uL (ref 0.0–0.7)
HCT: 26.5 % — ABNORMAL LOW (ref 39.0–52.0)
Hemoglobin: 8 g/dL — ABNORMAL LOW (ref 13.0–17.0)
Lymphocytes Relative: 11 %
Lymphs Abs: 1.6 10*3/uL (ref 0.7–4.0)
MCH: 24.4 pg — ABNORMAL LOW (ref 26.0–34.0)
MCHC: 30.2 g/dL (ref 30.0–36.0)
MCV: 80.8 fL (ref 78.0–100.0)
MONO ABS: 0.8 10*3/uL (ref 0.1–1.0)
Monocytes Relative: 6 %
Neutro Abs: 11.7 10*3/uL — ABNORMAL HIGH (ref 1.7–7.7)
Neutrophils Relative %: 83 %
PLATELETS: 309 10*3/uL (ref 150–400)
RBC: 3.28 MIL/uL — ABNORMAL LOW (ref 4.22–5.81)
RDW: 19.7 % — AB (ref 11.5–15.5)
WBC: 14.1 10*3/uL — ABNORMAL HIGH (ref 4.0–10.5)

## 2016-05-10 LAB — PREPARE FRESH FROZEN PLASMA
UNIT DIVISION: 0
Unit division: 0

## 2016-05-10 LAB — CULTURE, BLOOD (ROUTINE X 2): CULTURE: NO GROWTH

## 2016-05-10 LAB — MRSA PCR SCREENING: MRSA by PCR: NEGATIVE

## 2016-05-10 LAB — URINE MICROSCOPIC-ADD ON

## 2016-05-10 LAB — URINALYSIS, ROUTINE W REFLEX MICROSCOPIC
BILIRUBIN URINE: NEGATIVE
GLUCOSE, UA: NEGATIVE mg/dL
Ketones, ur: NEGATIVE mg/dL
Nitrite: NEGATIVE
PH: 5 (ref 5.0–8.0)
Protein, ur: NEGATIVE mg/dL
SPECIFIC GRAVITY, URINE: 1.042 — AB (ref 1.005–1.030)

## 2016-05-10 LAB — GLUCOSE, CAPILLARY: GLUCOSE-CAPILLARY: 154 mg/dL — AB (ref 65–99)

## 2016-05-10 LAB — TSH: TSH: 1.423 u[IU]/mL (ref 0.350–4.500)

## 2016-05-10 LAB — ETHANOL

## 2016-05-10 LAB — LACTIC ACID, PLASMA: Lactic Acid, Venous: 1.5 mmol/L (ref 0.5–1.9)

## 2016-05-10 LAB — LIPASE, BLOOD: LIPASE: 12 U/L (ref 11–51)

## 2016-05-10 LAB — I-STAT CG4 LACTIC ACID, ED: Lactic Acid, Venous: 3.02 mmol/L (ref 0.5–1.9)

## 2016-05-10 LAB — ABO/RH: ABO/RH(D): O POS

## 2016-05-10 LAB — CK: CK TOTAL: 271 U/L (ref 49–397)

## 2016-05-10 MED ORDER — SODIUM CHLORIDE 0.9 % IV BOLUS (SEPSIS)
1000.0000 mL | Freq: Once | INTRAVENOUS | Status: AC
  Administered 2016-05-10: 1000 mL via INTRAVENOUS

## 2016-05-10 MED ORDER — ARTIFICIAL TEARS 0.1-0.3 % OP SOLN: 2.0000 [drp] | Freq: Every day | OPHTHALMIC | Status: DC

## 2016-05-10 MED ORDER — HEPARIN SODIUM (PORCINE) 5000 UNIT/ML IJ SOLN
5000.0000 [IU] | Freq: Three times a day (TID) | INTRAMUSCULAR | Status: DC
Start: 2016-05-10 — End: 2016-05-11
  Administered 2016-05-10 – 2016-05-11 (×2): 5000 [IU] via SUBCUTANEOUS
  Filled 2016-05-10 (×2): qty 1

## 2016-05-10 MED ORDER — FOLIC ACID 1 MG PO TABS
1.0000 mg | ORAL_TABLET | Freq: Every day | ORAL | Status: DC
  Administered 2016-05-11 – 2016-05-15 (×5): 1 mg via ORAL
  Filled 2016-05-10 (×5): qty 1

## 2016-05-10 MED ORDER — SIMVASTATIN 10 MG PO TABS
5.0000 mg | ORAL_TABLET | Freq: Every day | ORAL | Status: DC
  Administered 2016-05-11 – 2016-05-15 (×5): 5 mg via ORAL
  Filled 2016-05-10 (×5): qty 1

## 2016-05-10 MED ORDER — ARIPIPRAZOLE 2 MG PO TABS
2.0000 mg | ORAL_TABLET | Freq: Every day | ORAL | Status: DC
  Administered 2016-05-11 – 2016-05-15 (×5): 2 mg via ORAL
  Filled 2016-05-10 (×5): qty 1

## 2016-05-10 MED ORDER — FAMOTIDINE 20 MG PO TABS
20.0000 mg | ORAL_TABLET | Freq: Every day | ORAL | Status: DC
  Administered 2016-05-11 – 2016-05-15 (×5): 20 mg via ORAL
  Filled 2016-05-10 (×5): qty 1

## 2016-05-10 MED ORDER — PIPERACILLIN-TAZOBACTAM 3.375 G IVPB 30 MIN
3.3750 g | Freq: Once | INTRAVENOUS | Status: AC
  Administered 2016-05-10: 3.375 g via INTRAVENOUS
  Filled 2016-05-10: qty 50

## 2016-05-10 MED ORDER — VANCOMYCIN HCL IN DEXTROSE 1-5 GM/200ML-% IV SOLN
1000.0000 mg | Freq: Once | INTRAVENOUS | Status: AC
  Administered 2016-05-10: 1000 mg via INTRAVENOUS
  Filled 2016-05-10: qty 200

## 2016-05-10 MED ORDER — SODIUM CHLORIDE 0.9 % IV BOLUS (SEPSIS)
1500.0000 mL | Freq: Once | INTRAVENOUS | Status: AC
  Administered 2016-05-10: 1500 mL via INTRAVENOUS

## 2016-05-10 MED ORDER — SODIUM CHLORIDE 0.9 % IV BOLUS (SEPSIS)
500.0000 mL | Freq: Once | INTRAVENOUS | Status: AC
  Administered 2016-05-10: 500 mL via INTRAVENOUS

## 2016-05-10 MED ORDER — PIPERACILLIN-TAZOBACTAM 3.375 G IVPB
3.3750 g | Freq: Two times a day (BID) | INTRAVENOUS | Status: DC
  Administered 2016-05-10 – 2016-05-11 (×2): 3.375 g via INTRAVENOUS
  Filled 2016-05-10 (×3): qty 50

## 2016-05-10 MED ORDER — ACETAMINOPHEN 650 MG RE SUPP: 650.0000 mg | Freq: Four times a day (QID) | RECTAL | Status: DC | PRN

## 2016-05-10 MED ORDER — SODIUM CHLORIDE 0.9 % IV SOLN
INTRAVENOUS | Status: DC
  Administered 2016-05-10 – 2016-05-11 (×2): via INTRAVENOUS
  Administered 2016-05-11: 125 mL via INTRAVENOUS
  Administered 2016-05-11 – 2016-05-12 (×2): via INTRAVENOUS

## 2016-05-10 MED ORDER — POLYVINYL ALCOHOL 1.4 % OP SOLN
2.0000 [drp] | Freq: Every day | OPHTHALMIC | Status: DC
  Administered 2016-05-12 – 2016-05-15 (×4): 2 [drp] via OPHTHALMIC
  Filled 2016-05-10 (×3): qty 15

## 2016-05-10 MED ORDER — VITAMIN B-1 100 MG PO TABS
100.0000 mg | ORAL_TABLET | Freq: Every day | ORAL | Status: DC
  Administered 2016-05-11 – 2016-05-15 (×5): 100 mg via ORAL
  Filled 2016-05-10 (×5): qty 1

## 2016-05-10 MED ORDER — IOPAMIDOL (ISOVUE-300) INJECTION 61%
INTRAVENOUS | Status: AC
  Administered 2016-05-10: 100 mL
  Filled 2016-05-10: qty 100

## 2016-05-10 MED ORDER — GABAPENTIN 300 MG PO CAPS
300.0000 mg | ORAL_CAPSULE | Freq: Three times a day (TID) | ORAL | Status: DC
  Administered 2016-05-11 – 2016-05-15 (×14): 300 mg via ORAL
  Filled 2016-05-10 (×14): qty 1

## 2016-05-10 MED ORDER — INSULIN ASPART 100 UNIT/ML ~~LOC~~ SOLN
0.0000 [IU] | Freq: Every day | SUBCUTANEOUS | Status: DC
Start: 2016-05-10 — End: 2016-05-15
  Administered 2016-05-13: 2 [IU] via SUBCUTANEOUS
  Administered 2016-05-14: 3 [IU] via SUBCUTANEOUS

## 2016-05-10 MED ORDER — INSULIN ASPART 100 UNIT/ML ~~LOC~~ SOLN
0.0000 [IU] | Freq: Three times a day (TID) | SUBCUTANEOUS | Status: DC
  Administered 2016-05-11 – 2016-05-12 (×3): 2 [IU] via SUBCUTANEOUS
  Administered 2016-05-12: 3 [IU] via SUBCUTANEOUS
  Administered 2016-05-12 – 2016-05-13 (×4): 2 [IU] via SUBCUTANEOUS
  Administered 2016-05-14 (×2): 5 [IU] via SUBCUTANEOUS
  Administered 2016-05-15: 2 [IU] via SUBCUTANEOUS
  Administered 2016-05-15: 3 [IU] via SUBCUTANEOUS

## 2016-05-10 MED ORDER — VANCOMYCIN HCL IN DEXTROSE 750-5 MG/150ML-% IV SOLN
750.0000 mg | INTRAVENOUS | Status: DC
  Administered 2016-05-11: 750 mg via INTRAVENOUS
  Filled 2016-05-10 (×2): qty 150

## 2016-05-10 MED ORDER — ACETAMINOPHEN 325 MG PO TABS: 650.0000 mg | ORAL_TABLET | Freq: Four times a day (QID) | ORAL | Status: DC | PRN

## 2016-05-10 MED ORDER — VITAMIN D 1000 UNITS PO TABS
2000.0000 [IU] | ORAL_TABLET | Freq: Every day | ORAL | Status: DC
  Administered 2016-05-11 – 2016-05-15 (×5): 2000 [IU] via ORAL
  Filled 2016-05-10 (×5): qty 2

## 2016-05-10 MED ORDER — SODIUM CHLORIDE 0.9 % IV BOLUS (SEPSIS): 1000.0000 mL | Freq: Once | INTRAVENOUS | Status: DC

## 2016-05-10 MED ORDER — ASPIRIN EC 81 MG PO TBEC: 81.0000 mg | DELAYED_RELEASE_TABLET | Freq: Every day | ORAL | Status: DC

## 2016-05-10 MED ORDER — SODIUM CHLORIDE 0.9 % IV BOLUS (SEPSIS): 500.0000 mL | Freq: Once | INTRAVENOUS | Status: DC

## 2016-05-10 NOTE — ED Notes (Signed)
c-collar removed per Dr. Venora Maples

## 2016-05-10 NOTE — Progress Notes (Signed)
Orthopedic Tech Progress Note Patient Details:  [name] [birth year] [id number]  Patient ID: Vincent Anes., male   DOB: 1941-01-03, 75 y.o.   MRN: [id number]   [name] 05/10/2016, 1:08 PM Made level 1 trauma visit

## 2016-05-10 NOTE — ED Triage Notes (Signed)
See trauma chart
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
## 2016-05-10 NOTE — ED Notes (Signed)
PT resting quietly with no complaints at this time

## 2016-05-10 NOTE — ED Notes (Signed)
Condom cath applied

## 2016-05-10 NOTE — ED Notes (Signed)
Report given to Daralene Milch, RN

## 2016-05-10 NOTE — ED Notes (Signed)
Family at beside. Family given emotional support.-- Daughter [name]-- contact person-- spoke with Dr. Venora Maples -- updated on condition.

## 2016-05-10 NOTE — H&P (Signed)
History   [name]. is an 75 y.o. male.   Chief Complaint: No chief complaint on file.   Ground level fall, maybe up to 4-5 hours, found down with large area of blood loss, hypotensive on evaluation.  Alert and talking.  Initial BP was 68 in ED, buyt there is a big differential between his right and left arms, with the left being much lower and where the BP had been taken.  Much better perfusion than SBP 68.  BP on the right was 138/8-0   Fall  This is a new problem. The current episode started today. The problem has been unchanged. Associated symptoms include headaches. Nothing aggravates the symptoms.    Past Medical History:  Diagnosis Date  . Diabetes mellitus without complication ([place])     No past surgical history on file.  No family history on file. Social History:  has no tobacco, alcohol, and drug history on file.  Allergies  Not on File  Home Medications   (Not in a hospital admission)  Trauma Course   Results for orders placed or performed during the hospital encounter of 05/10/16 (from the past 48 hour(s))  Type and screen     Status: None   Collection Time: 05/10/16 12:39 PM  Result Value Ref Range   ABO/RH(D) PENDING    Antibody Screen PENDING    Sample Expiration 05/13/2016    Unit Number [id number]    Blood Component Type RBC LR PHER2    Unit division 00    Status of Unit REL FROM [facility]    Unit tag comment VERBAL ORDERS PER DR CAMPOS    Transfusion Status OK TO TRANSFUSE    Crossmatch Result PENDING    Unit Number [id number]    Blood Component Type RED CELLS,LR    Unit division 00    Status of Unit REL FROM [facility]    Unit tag comment VERBAL ORDERS PER DR CAMPOS    Transfusion Status OK TO TRANSFUSE    Crossmatch Result PENDING   Prepare fresh frozen plasma     Status: None   Collection Time: 05/10/16 12:39 PM  Result Value Ref Range   Unit Number [id number]    Blood Component Type LIQ PLASMA    Unit division 00    Status of Unit  REL FROM [facility]    Unit tag comment VERBAL ORDERS PER DR CAMPOS    Transfusion Status OK TO TRANSFUSE    Unit Number [id number]    Blood Component Type LIQ PLASMA    Unit division 00    Status of Unit REL FROM [facility]    Unit tag comment VERBAL ORDERS PER DR CAMPOS    Transfusion Status OK TO TRANSFUSE   CBC with Differential/Platelet     Status: Abnormal   Collection Time: 05/10/16 12:59 PM  Result Value Ref Range   WBC 14.1 (H) 4.0 - 10.5 K/uL   RBC 3.28 (L) 4.22 - 5.81 MIL/uL   Hemoglobin 8.0 (L) 13.0 - 17.0 g/dL   HCT 26.5 (L) 39.0 - 52.0 %   MCV 80.8 78.0 - 100.0 fL   MCH 24.4 (L) 26.0 - 34.0 pg   MCHC 30.2 30.0 - 36.0 g/dL   RDW 19.7 (H) 11.5 - 15.5 %   Platelets 309 150 - 400 K/uL   Neutrophils Relative % 83 %   Neutro Abs 11.7 (H) 1.7 - 7.7 K/uL   Lymphocytes Relative 11 %   Lymphs Abs 1.6 0.7 - 4.0 K/uL  Monocytes Relative 6 %   Monocytes Absolute 0.8 0.1 - 1.0 K/uL   Eosinophils Relative 0 %   Eosinophils Absolute 0.0 0.0 - 0.7 K/uL   Basophils Relative 0 %   Basophils Absolute 0.0 0.0 - 0.1 K/uL  I-Stat CG4 Lactic Acid, ED     Status: Abnormal   Collection Time: 05/10/16  1:11 PM  Result Value Ref Range   Lactic Acid, Venous 3.02 (HH) 0.5 - 1.9 mmol/L   Comment NOTIFIED PHYSICIAN    Dg Pelvis Portable  Result Date: 05/10/2016 CLINICAL DATA:  Trauma. EXAM: PORTABLE PELVIS 1-2 VIEWS COMPARISON:  None. FINDINGS: There is no evidence of pelvic fracture or diastasis. No pelvic bone lesions are seen. Hips show normal alignment bilaterally without evidence of fracture or dislocation. IMPRESSION: Negative. Electronically Signed   By: Aletta Edouard M.D.   On: 05/10/2016 13:36   Dg Chest Portable 1 View  Result Date: 05/10/2016 CLINICAL DATA:  Fall. EXAM: PORTABLE CHEST 1 VIEW COMPARISON:  None. FINDINGS: The heart is mildly enlarged. There is no evidence of pulmonary edema, consolidation, pneumothorax, nodule or pleural fluid. The visualized skeletal structures are  unremarkable. IMPRESSION: No acute findings in the chest.  The heart is mildly enlarged. Electronically Signed   By: Aletta Edouard M.D.   On: 05/10/2016 13:35    Review of Systems  Neurological: Positive for headaches.    Pulse 95, temperature (!) 93.1 F (33.9 C), temperature source Rectal, resp. rate 16, SpO2 98 %. Physical Exam  Constitutional: He is oriented to person, place, and time. He appears well-developed and well-nourished.  HENT:  Head: Normocephalic. Head is with laceration (see the diagram).    Cardiovascular: Normal rate, regular rhythm and normal heart sounds.   Respiratory: Effort normal.  GI: Soft. Bowel sounds are normal.  CT negative with the exception of gallstonoes  Musculoskeletal:       Arms: Neurological: He is oriented to person, place, and time.  Skin: Skin is warm and dry.     Assessment/Plan GLF Scalp laceration with blood loss. Hypotension?, normal now. Anemic--Hemoglobin 8, but does not need blood now.  Last known hemoglobin recently was 10.7 Will follow, but does not need admission to trauma--possible other issues  [name] 05/10/2016, 1:45 PM   Procedures

## 2016-05-10 NOTE — Progress Notes (Signed)
RT responded to trauma call. Airway intact at this time. SAT 98% on RA. MD okay with RT leaving. Will monitor as needed

## 2016-05-10 NOTE — Progress Notes (Signed)
Pharmacy Antibiotic Note [name]. is a 75 y.o. male admitted on 05/10/2016 with concern for sepsis and ground level fall.  Noted to have AKI on admission with SCr 3.6 with baseline reportedly ~ 0.8.  UA from admission fairly dirty.   Pharmacy has been consulted for Zosyn and vancomycin dosing.  Plan: 1. Begin Zosyn 3.375 grams IV every 12 hours (infused over 4 hours) based on current renal fxn  2. Vancomycin 750 mg IV every 24 hours 3. F/u renal fxn and empirically adjust abx doses/intervals as needed  4. Can hopefully scale back abx in next 1 - 2 days (? UTI)  Height: 5\' 9"  (175.3 cm) Weight: 153 lb (69.4 kg) IBW/kg (Calculated) : 70.7  Temp (24hrs), Avg:96.1 F (35.6 C), Min:93.1 F (33.9 C), Max:98.9 F (37.2 C)   Recent Labs Lab 05/10/16 1259 05/10/16 1311 05/10/16 1904  WBC 14.1*  --   --   CREATININE 3.66*  --   --   LATICACIDVEN  --  3.02* 1.5    Estimated Creatinine Clearance: 17.1 mL/min (by C-G formula based on SCr of 3.66 mg/dL).    Not on File  Antimicrobials this admission: 8/9 Zosyn >>  8/9  Vancomycin >>   Dose adjustments this admission: n/a  Microbiology results: 8/9 BCx: px 8/9 UCx: px    Thank you for allowing pharmacy to be a part of this patient's care.  Vincenza Hews, PharmD, BCPS 05/10/2016, 7:51 PM Pager: [phone]

## 2016-05-10 NOTE — ED Notes (Signed)
Pt resting with eyes closed but will arouse to verbal stimuli.  Condom cath in place to obtain urine specimen

## 2016-05-10 NOTE — H&P (Signed)
[facility] Admission History and Physical Service Pager: [phone]  Patient name: [name]. Medical record number: [id number] Date of birth: Feb 28, 1941 Age: 75 y.o. Gender: male  Primary Care Provider: No PCP Per Patient Consultants: None Code Status: Full  Chief Complaint: Fall  Assessment and Plan: Vincent Crosslin. is a 75 y.o. male presenting after fall . PMH is significant for HTN, DM2, CAD, CVA, COPD, ETOH use, tobacco disorder, protein calorie malnutrition, dementia   Sepsis- Unclear etiology. Hypotensive, hypothermic on presentation. WBC 14.1.  Lactic acid 3. S/p fluid bolus with early initiation of Vanc/Zosyn. Blood pressure improved in the ED with 2.5 liters. -  admit to step down Dr Gwendlyn Deutscher attending -continue aggressive fluid hydration - trend lactate - consider procalcitonin - vanc/zosyn - follow blood culture - follow urine studies - of note received BCx result from previous admission which showed Micrococcus species in 1/2 bottles- which is likely a contaminant  Fall- Unclear cause of fall with as patient is a poor historian and this was an unwitnessed event. Although he was found unresponsive in the nursing home, he improved with EMS and was responsive in the ED. Blood sugar with EMS was 368. CT head negative for acute pathology, oriented x4 in the ED. History of possible dizziness concerning for syncopal etiology however as he reports he tripped the fall is likely mechanical in nature. However, must consider cardiac event in setting of h/o CAD. Denies chest pain at this time making it less likely and EKG unchanged from prior on 8/3. No O2 requirement, with normal vitals signs makes PE less likely cause as well. Wells score 1 for increasing heart rate. Patient not not tachycardic on admission, but given concern for sepsis this is potentially due to this especially in setting of normal O2 saturations and no SOB.   - telemetry -  echocardiogram - consider troponin if develops chest pain or EKG changes - PT/OT as clinically improves - falls precautions - f/u UDS, etoh level - staples were used to head laceration- remove in 7-10 days  Blood loss Anemia- Hgb 8 from 10.2 on 8/5 after blood loss from fall. No current bleeding at this time. Transfusion threshhold 8 given CAD - trend CBC  AKI: Cr 3.66 on admission, Baseline 0.6-0.9. Potentially due to dehydration. - s/p 1L bolus - Continue MIVF - Avoid nephrotoxic agents - repeat BMP in am - follow urine studies which will help with hydration status    DM2: Recent admission for hypoglycemia but blood glucose has been within normal limits during this admission. After speaking to the ED his last blood sugar was 128 at 5 pm yesterday evening, and 368 with EMS this AM with normal blood glucose  - MIVFs - SSI sensitive, ACHS - Neurontin 300 TID     HTN/ CAD: Hypotensive on admission to 58/palpable improved with IVF. BPs now stable in the 120s/60s-70s  - Hold home antihypertensives - continue MIVF, bolus as needed - continue to monitor   h/o CVA. On Zocor 5.  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 Stable - Continue statin - hold baclofen for now given renal injury    COPD - Monitor - O2 prn    Protein calorie malnutrition - Consulted nutrition for assessment at last visit  Depression - continue home abilify  FEN/GI: heart/carb modified, NSIV, pepcid Prophylaxis: heparin   Disposition: Pending clinical improvement  History of Present Illness:  [name]. is a 75 y.o. male presenting after being found  down in his [place]. He was in his usual state of health yesterday and early this AM. This AM he was noted to refuse breakfast, then he locked himself in his room. He was alone for approximately 5 hours. When the nursing staff went to check on him for lunch, they found he was lying unresponsive on the floor in a pool of blood where he  had hit his head. EMS was called at which time his blood glucose was 368. He became responsive with EMS and continued to improve in the ED. In the ED he as noted to be hypotensive and hypothermic and fluids and antibiotics were started. His head laceration was repaired with staples He reports that he tripped over something which made him fall but did feel dizzy prior to falling. He remembers the fall, but not immediately afterwards. He denies chest pain, SOB, recent fevers or illness  Review Of Systems: Per HPI with the following additions:  Otherwise the remainder of the systems were negative.  Patient Active Problem List   Diagnosis Date Noted  . AKI (acute kidney injury) ([place]) 05/10/2016    Past Medical History: Past Medical History:  Diagnosis Date  . Diabetes mellitus without complication [facility])     Past Surgical History: No past surgical history on file.  Social History: Social History  Substance Use Topics  . Smoking status: Current Every Day Smoker    Types: Cigarettes  . Smokeless tobacco: Never Used  . Alcohol use Yes     Comment: duaghter states that someone is bringing him ETOH at the nursing home.    Additional social history:  Please also refer to relevant sections of EMR.   Allergies and Medications: Not on File No current facility-administered medications on file prior to encounter.    No current outpatient prescriptions on file prior to encounter.    Objective: BP 120/67   Pulse 102   Temp (!) 96.4 F (35.8 C) (Rectal)   Resp 20   Ht 5\' 11"  (1.803 m)   Wt 158 lb (71.7 kg)   SpO2 96%   BMI 22.04 kg/m  Exam: General: NAD, lying in bed Head: dried blood over head, obscuring laceration over occiput Eyes: EOMI, PERRL Neck: left IJ access in place Cardiovascular: RRR Respiratory: CTAB Abdomen: soft, non tender, +BS MSK: left hand contracture Neuro: AOx4, CNII-X grossly intact, 4/5 strength b/l upper extremities 5/5 strength bilateral lower  extremities Psych: normla mood and affect  Labs and Imaging: CBC BMET   Recent Labs Lab 05/10/16 1259  WBC 14.1*  HGB 8.0*  HCT 26.5*  PLT 309    Recent Labs Lab 05/10/16 1259  NA 135  K 4.8  CL 105  CO2 19*  BUN 47*  CREATININE 3.66*  GLUCOSE 296*  CALCIUM 8.8*     CXR No acute findings in the chest.  The heart is mildly enlarged  CT head/C spine Scalp laceration at the posterior vertex without evidence of skull fracture or underlying brain injury. Advanced small vessel ischemic disease present as well as old infarct of the right occipital lobe. 2. No evidence of acute cervical fracture. 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 No acute injuries identified in the abdomen or pelvis. 2. Cholelithiasis. 3. Evidence of calcified chronic pancreatitis. 4. Large hiatal hernia. 5. Aortic atherosclerosis without evidence of aneurysm.  Veatrice Bourbon, MD 05/10/2016, 4:27 PM PGY-3, [place]  Intern pager: [phone], text pages welcome

## 2016-05-10 NOTE — ED Notes (Signed)
Admitting MD at bedside for assessment

## 2016-05-10 NOTE — ED Notes (Signed)
Pt voided via condom cath, urine specimen sent to lab

## 2016-05-11 ENCOUNTER — Inpatient Hospital Stay (HOSPITAL_COMMUNITY): Payer: Medicare Other

## 2016-05-11 ENCOUNTER — Encounter (HOSPITAL_COMMUNITY): Payer: Self-pay | Admitting: Family Medicine

## 2016-05-11 DIAGNOSIS — R55 Syncope and collapse: Secondary | ICD-10-CM

## 2016-05-11 DIAGNOSIS — S0101XA Laceration without foreign body of scalp, initial encounter: Secondary | ICD-10-CM

## 2016-05-11 DIAGNOSIS — A419 Sepsis, unspecified organism: Principal | ICD-10-CM

## 2016-05-11 DIAGNOSIS — N179 Acute kidney failure, unspecified: Secondary | ICD-10-CM

## 2016-05-11 DIAGNOSIS — E118 Type 2 diabetes mellitus with unspecified complications: Secondary | ICD-10-CM

## 2016-05-11 DIAGNOSIS — W19XXXA Unspecified fall, initial encounter: Secondary | ICD-10-CM

## 2016-05-11 DIAGNOSIS — E1149 Type 2 diabetes mellitus with other diabetic neurological complication: Secondary | ICD-10-CM

## 2016-05-11 DIAGNOSIS — D649 Anemia, unspecified: Secondary | ICD-10-CM

## 2016-05-11 LAB — TROPONIN I: Troponin I: <0.03 ng/mL (ref <0.03)

## 2016-05-11 LAB — CBC
HCT: 21.1 % — ABNORMAL LOW (ref 39.0–52.0)
HCT: 25.4 % — ABNORMAL LOW (ref 39.0–52.0)
HEMOGLOBIN: 7.9 g/dL — AB (ref 13.0–17.0)
Hemoglobin: 6.4 g/dL — CL (ref 13.0–17.0)
MCH: 23.5 pg — ABNORMAL LOW (ref 26.0–34.0)
MCH: 25.3 pg — ABNORMAL LOW (ref 26.0–34.0)
MCHC: 29.9 g/dL — AB (ref 30.0–36.0)
MCHC: 31.1 g/dL (ref 30.0–36.0)
MCV: 78.7 fL (ref 78.0–100.0)
MCV: 81.4 fL (ref 78.0–100.0)
PLATELETS: 259 10*3/uL (ref 150–400)
Platelets: 238 10*3/uL (ref 150–400)
RBC: 2.68 MIL/uL — ABNORMAL LOW (ref 4.22–5.81)
RBC: 3.12 MIL/uL — ABNORMAL LOW (ref 4.22–5.81)
RDW: 19.7 % — AB (ref 11.5–15.5)
RDW: 19.1 % — AB (ref 11.5–15.5)
WBC: 10.7 10*3/uL — AB (ref 4.0–10.5)
WBC: 10.4 10*3/uL (ref 4.0–10.5)

## 2016-05-11 LAB — PREPARE RBC (CROSSMATCH)

## 2016-05-11 LAB — BASIC METABOLIC PANEL
Anion gap: 6 (ref 5–15)
BUN: 36 mg/dL — AB (ref 6–20)
CHLORIDE: 111 mmol/L (ref 101–111)
CO2: 22 mmol/L (ref 22–32)
CREATININE: 2.39 mg/dL — AB (ref 0.61–1.24)
Calcium: 8.1 mg/dL — ABNORMAL LOW (ref 8.9–10.3)
GFR calc Af Amer: 29 mL/min — ABNORMAL LOW (ref >60)
GFR calc non Af Amer: 25 mL/min — ABNORMAL LOW (ref >60)
GLUCOSE: 137 mg/dL — AB (ref 65–99)
Potassium: 3.7 mmol/L (ref 3.5–5.1)
SODIUM: 139 mmol/L (ref 135–145)

## 2016-05-11 LAB — GLUCOSE, CAPILLARY
GLUCOSE-CAPILLARY: 170 mg/dL — AB (ref 65–99)
GLUCOSE-CAPILLARY: 169 mg/dL — AB (ref 65–99)
Glucose-Capillary: 136 mg/dL — ABNORMAL HIGH (ref 65–99)
Glucose-Capillary: 145 mg/dL — ABNORMAL HIGH (ref 65–99)
Glucose-Capillary: 172 mg/dL — ABNORMAL HIGH (ref 65–99)

## 2016-05-11 LAB — URINE CULTURE

## 2016-05-11 LAB — ECHOCARDIOGRAM COMPLETE
Height: 69 in
Weight: 2447.99 oz

## 2016-05-11 LAB — BLOOD PRODUCT ORDER (VERBAL) VERIFICATION

## 2016-05-11 MED ORDER — ENSURE ENLIVE PO LIQD
237.0000 mL | Freq: Two times a day (BID) | ORAL | Status: DC
  Administered 2016-05-12 – 2016-05-15 (×8): 237 mL via ORAL

## 2016-05-11 MED ORDER — PIPERACILLIN-TAZOBACTAM 3.375 G IVPB
3.3750 g | Freq: Three times a day (TID) | INTRAVENOUS | Status: DC
  Administered 2016-05-11 – 2016-05-12 (×3): 3.375 g via INTRAVENOUS
  Filled 2016-05-11 (×4): qty 50

## 2016-05-11 MED ORDER — ASPIRIN EC 81 MG PO TBEC
81.0000 mg | DELAYED_RELEASE_TABLET | Freq: Every day | ORAL | Status: DC
  Administered 2016-05-11 – 2016-05-15 (×5): 81 mg via ORAL
  Filled 2016-05-11 (×5): qty 1

## 2016-05-11 MED ORDER — SODIUM CHLORIDE 0.9 % IV SOLN: Freq: Once | INTRAVENOUS | Status: AC

## 2016-05-11 MED ORDER — HEPARIN SODIUM (PORCINE) 5000 UNIT/ML IJ SOLN
5000.0000 [IU] | Freq: Three times a day (TID) | INTRAMUSCULAR | Status: DC
  Administered 2016-05-11 – 2016-05-15 (×12): 5000 [IU] via SUBCUTANEOUS
  Filled 2016-05-11 (×12): qty 1

## 2016-05-11 NOTE — Care Management Important Message (Signed)
Important Message  Patient Details  Name: [name]. MRN: [id number] Date of Birth: 05-16-1941   Medicare Important Message Given:  Yes    Loann Quill 05/11/2016, 8:24 AM

## 2016-05-11 NOTE — Progress Notes (Signed)
Inpatient Diabetes Program Recommendations  AACE/ADA: New Consensus Statement on Inpatient Glycemic Control (2015)  Target Ranges:  Prepandial:   less than 140 mg/dL      Peak postprandial:   less than 180 mg/dL (1-2 hours)      Critically ill patients:  140 - 180 mg/dL   Results for CORDERIUS, GASKA (MRN [id number]) as of 05/11/2016 09:06  Ref. Range 05/10/2016 21:56 05/11/2016 08:49  Glucose-Capillary Latest Ref Range: 65 - 99 mg/dL 154 (H) 170 (H)    Review of Glycemic Control  Diabetes history: DM 2 Outpatient Diabetes medications: Lantus 22 units hs+Humalog 7 units tid meal coverage+Metformin 500 bid Current orders for Inpatient glycemic control: Novolog correction 0-9 units tid + 0-5 units hs  Inpatient Diabetes Program Recommendations:  Please consider A1c to determine prehospital glycemic control. Also Lantus 11 units (50% home dose) and will need meal coverage with Novolog when eating.  Thank you, Nani Gasser. Judithe Keetch, RN, MSN, CDE Inpatient Glycemic Control Team Team Pager [phone] (8am-5pm) 05/11/2016 9:08 AM

## 2016-05-11 NOTE — Progress Notes (Signed)
CRITICAL VALUE ALERT  Critical value received:  hgb 6.4  Date of notification:  05/11/16  Time of notification:  0530  Critical value read back: yes  Nurse who received alert:  Verdis Frederickson  MD notified (1st page): Teaching service on call  Time of first page:  0535  MD notified (2nd page):  Time of second page:  Responding MD:  Dr. Reesa Chew  Time MD responded: 0600

## 2016-05-11 NOTE — Progress Notes (Signed)
*  PRELIMINARY RESULTS* Echocardiogram 2D Echocardiogram has been performed.  [name] 05/11/2016, 3:33 PM

## 2016-05-11 NOTE — Progress Notes (Signed)
Patient ID: [name]., male   DOB: 22-Aug-1941, 75 y.o.   MRN: [id number]    Subjective: No HA, no abdominal pain  Objective: Vital signs in last 24 hours: Temp:  [93.1 F (33.9 C)-98.9 F (37.2 C)] 98.1 F (36.7 C) (08/10 0740) Pulse Rate:  [89-122] 102 (08/10 0740) Resp:  [10-22] 12 (08/10 0740) BP: (95-157)/(58-80) 131/70 (08/10 0740) SpO2:  [92 %-98 %] 92 % (08/10 0740) Weight:  [69.4 kg (153 lb)-71.7 kg (158 lb)] 69.4 kg (153 lb) (08/09 1939)    Intake/Output from previous day: 08/09 0701 - 08/10 0700 In: 4433.3 [I.V.:4383.3; IV Piggyback:50] Out: [id number] [Urine:1150] Intake/Output this shift: No intake/output data recorded.  General appearance: cooperative Head: small scalp lac with no bleeding Resp: clear to auscultation bilaterally GI: soft, non-tender  Lab Results: CBC   Recent Labs  05/10/16 1259 05/11/16 0345  WBC 14.1* 10.7*  HGB 8.0* 6.4*  HCT 26.5* 21.1*  PLT 309 259   BMET  Recent Labs  05/10/16 1259 05/11/16 0345  NA 135 139  K 4.8 3.7  CL 105 111  CO2 19* 22  GLUCOSE 296* 137*  BUN 47* 36*  CREATININE 3.66* 2.39*  CALCIUM 8.8* 8.1*   PT/INR No results for input(s): LABPROT, INR in the last 72 hours. ABG No results for input(s): PHART, HCO3 in the last 72 hours.  Invalid input(s): PCO2, PO2  Studies/Results: Ct Head Wo Contrast  Result Date: 05/10/2016 CLINICAL DATA:  Fall with posterior scalp laceration and head injury. EXAM: CT HEAD WITHOUT CONTRAST CT CERVICAL SPINE WITHOUT CONTRAST TECHNIQUE: Multidetector CT imaging of the head and cervical spine was performed following the standard protocol without intravenous contrast. Multiplanar CT image reconstructions of the cervical spine were also generated. COMPARISON:  None. FINDINGS: CT HEAD FINDINGS The brain shows moderately advanced small vessel ischemic changes in the periventricular white matter. There is an old right-sided posterior cerebral territory infarct involving the  occipital lobe. Old lacunar infarct identified in the right thalamus. The brain demonstrates no evidence of hemorrhage, infarction, edema, mass effect, extra-axial fluid collection, hydrocephalus or mass lesion. Skin staples are present at the posterior vertex. No underlying skull fracture identified. No evidence of soft tissue foreign body. Mucosal thickening is present in left-sided frontal air cells. CT CERVICAL SPINE FINDINGS The cervical spine shows normal alignment. There is no evidence of acute fracture or subluxation. No soft tissue swelling or hematoma is identified. Advanced spondylosis noted throughout the cervical spine with near complete bony fusion at the C3-4 and C4-5 levels and severe disc space narrowing at the C5-6 and C6-7 levels. No bony or soft tissue lesions are seen. The visualized airway is normally patent. IMPRESSION: 1. Scalp laceration at the posterior vertex without evidence of skull fracture or underlying brain injury. Advanced small vessel ischemic disease present as well as old infarct of the right occipital lobe. 2. No evidence of acute cervical fracture. 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 Electronically Signed   By: Aletta Edouard M.D.   On: 05/10/2016 13:52   Ct Cervical Spine Wo Contrast  Result Date: 05/10/2016 CLINICAL DATA:  Fall with posterior scalp laceration and head injury. EXAM: CT HEAD WITHOUT CONTRAST CT CERVICAL SPINE WITHOUT CONTRAST TECHNIQUE: Multidetector CT imaging of the head and cervical spine was performed following the standard protocol without intravenous contrast. Multiplanar CT image reconstructions of the cervical spine were also generated. COMPARISON:  None. FINDINGS: CT HEAD FINDINGS The brain shows moderately  advanced small vessel ischemic changes in the periventricular white matter. There is an old right-sided posterior cerebral territory infarct involving the occipital lobe. Old lacunar infarct  identified in the right thalamus. The brain demonstrates no evidence of hemorrhage, infarction, edema, mass effect, extra-axial fluid collection, hydrocephalus or mass lesion. Skin staples are present at the posterior vertex. No underlying skull fracture identified. No evidence of soft tissue foreign body. Mucosal thickening is present in left-sided frontal air cells. CT CERVICAL SPINE FINDINGS The cervical spine shows normal alignment. There is no evidence of acute fracture or subluxation. No soft tissue swelling or hematoma is identified. Advanced spondylosis noted throughout the cervical spine with near complete bony fusion at the C3-4 and C4-5 levels and severe disc space narrowing at the C5-6 and C6-7 levels. No bony or soft tissue lesions are seen. The visualized airway is normally patent. IMPRESSION: 1. Scalp laceration at the posterior vertex without evidence of skull fracture or underlying brain injury. Advanced small vessel ischemic disease present as well as old infarct of the right occipital lobe. 2. No evidence of acute cervical fracture. 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 Electronically Signed   By: Aletta Edouard M.D.   On: 05/10/2016 13:52   Ct Abdomen Pelvis W Contrast  Result Date: 05/10/2016 CLINICAL DATA:  Fall.  Hypotensive. EXAM: CT ABDOMEN AND PELVIS WITH CONTRAST TECHNIQUE: Multidetector CT imaging of the abdomen and pelvis was performed using the standard protocol following bolus administration of intravenous contrast. CONTRAST:  159mL ISOVUE-300 IOPAMIDOL (ISOVUE-300) INJECTION 61% COMPARISON:  None. FINDINGS: Lower chest:  Bibasilar scarring present. Hepatobiliary: The liver appears normal. The gallbladder contains numerous calcified gallstones. There is no evidence of gallbladder inflammation or biliary ductal dilatation. Pancreas: The pancreas is atrophic and demonstrates extensive parenchymal calcifications, consistent with chronic  pancreatitis. Spleen: Within normal limits in size and appearance. Adrenals/Urinary Tract: Adrenal glands and kidneys are unremarkable bilaterally. The bladder has a normal appearance. Stomach/Bowel: There is a large hiatal hernia. Bowel shows no evidence of obstruction, inflammation or perforation. Diverticulosis of the descending and sigmoid colon present without evidence of diverticulitis. No free air or abnormal fluid collections. Vascular/Lymphatic: No pathologically enlarged lymph nodes. The abdominal aorta is diffusely calcified without evidence of of aneurysm. Other: No hernias identified. Musculoskeletal: No evidence of fractures. Moderate disc space narrowing is present at L5-S1. IMPRESSION: 1. No acute injuries identified in the abdomen or pelvis. 2. Cholelithiasis. 3. Evidence of calcified chronic pancreatitis. 4. Large hiatal hernia. 5. Aortic atherosclerosis without evidence of aneurysm. Electronically Signed   By: Aletta Edouard M.D.   On: 05/10/2016 13:56   Dg Pelvis Portable  Result Date: 05/10/2016 CLINICAL DATA:  Trauma. EXAM: PORTABLE PELVIS 1-2 VIEWS COMPARISON:  None. FINDINGS: There is no evidence of pelvic fracture or diastasis. No pelvic bone lesions are seen. Hips show normal alignment bilaterally without evidence of fracture or dislocation. IMPRESSION: Negative. Electronically Signed   By: Aletta Edouard M.D.   On: 05/10/2016 13:36   Dg Chest Portable 1 View  Result Date: 05/10/2016 CLINICAL DATA:  Fall. EXAM: PORTABLE CHEST 1 VIEW COMPARISON:  None. FINDINGS: The heart is mildly enlarged. There is no evidence of pulmonary edema, consolidation, pneumothorax, nodule or pleural fluid. The visualized skeletal structures are unremarkable. IMPRESSION: No acute findings in the chest.  The heart is mildly enlarged. Electronically Signed   By: Aletta Edouard M.D.   On: 05/10/2016 13:35    Anti-infectives: Anti-infectives    Start  Dose/Rate Route Frequency Ordered Stop   05/11/16  1100  vancomycin (VANCOCIN) IVPB 750 mg/150 ml premix     750 mg 150 mL/hr over 60 Minutes Intravenous Every 24 hours 05/10/16 1948     05/10/16 2200  piperacillin-tazobactam (ZOSYN) IVPB 3.375 g     3.375 g 12.5 mL/hr over 240 Minutes Intravenous Every 12 hours 05/10/16 1948     05/10/16 1330  vancomycin (VANCOCIN) IVPB 1000 mg/200 mL premix     1,000 mg 200 mL/hr over 60 Minutes Intravenous  Once 05/10/16 1322 05/10/16 1542   05/10/16 1330  piperacillin-tazobactam (ZOSYN) IVPB 3.375 g     3.375 g 100 mL/hr over 30 Minutes Intravenous  Once 05/10/16 1322 05/10/16 1435      Assessment/Plan: Scalp lac ABL anemia in setting of chronic anemia - TF per primary service  Please re-call trauma PRN   LOS: 1 day    Georganna Skeans, MD, MPH, FACS Trauma: [phone] General Surgery: [phone]  05/11/2016

## 2016-05-11 NOTE — Progress Notes (Signed)
Dr. Reesa Chew called and updated on patient status. New orders received fior CBC at 1100 today. Physician states she would hold off on ordering transfusion now.

## 2016-05-11 NOTE — Progress Notes (Signed)
Family Medicine Teaching Service Daily Progress Note Intern Pager: [phone]  Patient name: [name]. Medical record number: [id number] Date of birth: [birth year] Age: 75 y.o. Gender: male  Primary Care Provider: No PCP Per Patient Consultants: none Code Status: FULL  Pt Overview and Major Events to Date:  8/9 Admitted to SDU for sepsis of unknown source  Assessment and Plan: Lynn Wagaman. is a 75 y.o. male presenting after fall . PMH is significant for HTN, DM2, CAD, CVA, COPD, ETOH use, tobacco disorder, protein calorie malnutrition, dementia  Sepsis- Unclear etiology, potential UTI given UA showing few bacteria, numerous WBC, mod leuks but neg for nitrites and [id number] squamous so uncertain. Was hypotensive and hypothermic on presentation, with improvement overnight but still tachycardiac.  WBC 14.1 on admit, now 10.7 likely from hemodilution.  Lactic acid 3 on admit now 1.5. Denies dysuria, urinary frequency or urgency. Of note recent ED 04/29/16 visit for UTI with keflex as outpatient. - continue aggressive fluid hydration.  - consider procalcitonin - vanc/zosyn (8/9-) - follow blood culture and urine studies - of note received BCx result from previous admission which showed Micrococcus species in 1/2 bottles- which is likely a contaminant  Tachycardia in setting of h/o CAD HR 90-110 with EKG changes last night with T inversion in V4-V6 that are improved on this am EKG likely 2/2 demand from sepsis. Last echo 2013 with EF 50-55% probable akinesis of the basal inferior myocardium. Denies chest pain.  - monitor on tele - troponin stat pending - echo pending - consider Ddimer, hold off since chest pain free and O2 sat 98% on room air  Asymptomatic Anemia. Last admit was discharged with Hgb 10.2, baseline 9-11  On admit 8.0 likely from scalp laceration but now down to 6.4 likely 2/2 hemodiluation given decrease across all cell lines overnight with aggressive IVF - monitor CBC -  transfuse <8 - 1U pRBC today, check post transfusion H&H - hold ASA81 and heparin  Fall- Unclear cause of fall with as patient is a poor historian and this was an unwitnessed event. CT head negative for acute pathology, oriented x4 in the ED. History of possible dizziness concerning for syncopal etiology however as he reports he tripped the fall is likely mechanical in nature.  - telemetry - echocardiogram - PT/OT as clinically improves - falls precautions - staples were used to head laceration, remove in 7-10 days (05/17/16-05/19/16)  AKI, improving: Cr 3.66 on admit now 2.39, Baseline 0.6-0.9. Likely due to dehydration given improvement with s/p 1L bolus - Continue MIVF - Avoid nephrotoxic agents - repeat BMP in am - follow urine studies which will help with hydration status   DM2: Recent admission for hypoglycemia but blood glucose has been within normal limits during this hospital stay. Last a1c 6.8 on 05/05/16 - MIVFs - SSI sensitive, ACHS - Neurontin 300 TID   HTN: Hypotensive on admission to 58/palpable improved with IVF to 120s/60s-70s. This am BP 131/70.   - Hold home antihypertensives - continue MIVF, bolus as needed - monitor BP  h/o CVA. On Zocor 5. 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 Stable - Continue statin - hold baclofen for now given renal injury    COPD, not in exacerbation - Monitor - O2 prn    Protein calorie malnutrition - Consulted nutrition for assessment at last visit  Depression - continue home abilify  FEN/GI: NS@125 ,  heart/carb modified, pepcid Prophylaxis: SCDs  Disposition: pending  Subjective:  States feels well this am. Only  complaint is dry lips. Denies Cp. Denies dysuria, urinary frequency or urgency, fever/chills.  Objective: Temp:  [93.1 F (33.9 C)-98.9 F (37.2 C)] 97.5 F (36.4 C) (08/10 0408) Pulse Rate:  [89-122] 111 (08/10 0408) Resp:  [10-22] 19 (08/10 0408) BP: (95-157)/(58-80) 108/58 (08/10 0408) SpO2:  [92 %-98 %]  92 % (08/10 0408) Weight:  [153 lb (69.4 kg)-158 lb (71.7 kg)] 153 lb (69.4 kg) (08/09 1939) Physical Exam: General: lying in bed with eyes closed, in NAD Cardiovascular: tachycardic, no murmurs appreciated Respiratory: CTAB, normal effort Abdomen: soft, non tender, +BS Extremities: left hand contracture Neuro: alert & oriented, no focal deficits Skin: laceration on occiput with staples intact  Laboratory:  Recent Labs Lab 05/10/16 1259 05/11/16 0345  WBC 14.1* 10.7*  HGB 8.0* 6.4*  HCT 26.5* 21.1*  PLT 309 259    Recent Labs Lab 05/10/16 1259 05/11/16 0345  NA 135 139  K 4.8 3.7  CL 105 111  CO2 19* 22  BUN 47* 36*  CREATININE 3.66* 2.39*  CALCIUM 8.8* 8.1*  PROT 6.0*  --   BILITOT 0.5  --   ALKPHOS 64  --   ALT 20  --   AST 31  --   GLUCOSE 296* 137*   Urinalysis    Component Value Date/Time   COLORURINE YELLOW 05/10/2016 1741   APPEARANCEUR CLOUDY (A) 05/10/2016 1741   LABSPEC 1.042 (H) 05/10/2016 1741   PHURINE 5.0 05/10/2016 1741   GLUCOSEU NEGATIVE 05/10/2016 1741   HGBUR MODERATE (A) 05/10/2016 1741   BILIRUBINUR NEGATIVE 05/10/2016 1741   KETONESUR NEGATIVE 05/10/2016 1741   PROTEINUR NEGATIVE 05/10/2016 1741   NITRITE NEGATIVE 05/10/2016 1741   LEUKOCYTESUR MODERATE (A) 05/10/2016 1741     Imaging/Diagnostic Tests: Ct Head Wo Contrast  Result Date: 05/10/2016 CLINICAL DATA:  Fall with posterior scalp laceration and head injury. EXAM: CT HEAD WITHOUT CONTRAST CT CERVICAL SPINE WITHOUT CONTRAST TECHNIQUE: Multidetector CT imaging of the head and cervical spine was performed following the standard protocol without intravenous contrast. Multiplanar CT image reconstructions of the cervical spine were also generated. COMPARISON:  None. FINDINGS: CT HEAD FINDINGS The brain shows moderately advanced small vessel ischemic changes in the periventricular white matter. There is an old right-sided posterior cerebral territory infarct involving the occipital  lobe. Old lacunar infarct identified in the right thalamus. The brain demonstrates no evidence of hemorrhage, infarction, edema, mass effect, extra-axial fluid collection, hydrocephalus or mass lesion. Skin staples are present at the posterior vertex. No underlying skull fracture identified. No evidence of soft tissue foreign body. Mucosal thickening is present in left-sided frontal air cells. CT CERVICAL SPINE FINDINGS The cervical spine shows normal alignment. There is no evidence of acute fracture or subluxation. No soft tissue swelling or hematoma is identified. Advanced spondylosis noted throughout the cervical spine with near complete bony fusion at the C3-4 and C4-5 levels and severe disc space narrowing at the C5-6 and C6-7 levels. No bony or soft tissue lesions are seen. The visualized airway is normally patent. IMPRESSION: 1. Scalp laceration at the posterior vertex without evidence of skull fracture or underlying brain injury. Advanced small vessel ischemic disease present as well as old infarct of the right occipital lobe. 2. No evidence of acute cervical fracture. 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 Electronically Signed   By: Aletta Edouard M.D.   On: 05/10/2016 13:52   Ct Cervical Spine Wo Contrast  Result Date: 05/10/2016 CLINICAL DATA:  Fall  with posterior scalp laceration and head injury. EXAM: CT HEAD WITHOUT CONTRAST CT CERVICAL SPINE WITHOUT CONTRAST TECHNIQUE: Multidetector CT imaging of the head and cervical spine was performed following the standard protocol without intravenous contrast. Multiplanar CT image reconstructions of the cervical spine were also generated. COMPARISON:  None. FINDINGS: CT HEAD FINDINGS The brain shows moderately advanced small vessel ischemic changes in the periventricular white matter. There is an old right-sided posterior cerebral territory infarct involving the occipital lobe. Old lacunar infarct identified in  the right thalamus. The brain demonstrates no evidence of hemorrhage, infarction, edema, mass effect, extra-axial fluid collection, hydrocephalus or mass lesion. Skin staples are present at the posterior vertex. No underlying skull fracture identified. No evidence of soft tissue foreign body. Mucosal thickening is present in left-sided frontal air cells. CT CERVICAL SPINE FINDINGS The cervical spine shows normal alignment. There is no evidence of acute fracture or subluxation. No soft tissue swelling or hematoma is identified. Advanced spondylosis noted throughout the cervical spine with near complete bony fusion at the C3-4 and C4-5 levels and severe disc space narrowing at the C5-6 and C6-7 levels. No bony or soft tissue lesions are seen. The visualized airway is normally patent. IMPRESSION: 1. Scalp laceration at the posterior vertex without evidence of skull fracture or underlying brain injury. Advanced small vessel ischemic disease present as well as old infarct of the right occipital lobe. 2. No evidence of acute cervical fracture. 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 Electronically Signed   By: Aletta Edouard M.D.   On: 05/10/2016 13:52   Ct Abdomen Pelvis W Contrast  Result Date: 05/10/2016 CLINICAL DATA:  Fall.  Hypotensive. EXAM: CT ABDOMEN AND PELVIS WITH CONTRAST TECHNIQUE: Multidetector CT imaging of the abdomen and pelvis was performed using the standard protocol following bolus administration of intravenous contrast. CONTRAST:  126mL ISOVUE-300 IOPAMIDOL (ISOVUE-300) INJECTION 61% COMPARISON:  None. FINDINGS: Lower chest:  Bibasilar scarring present. Hepatobiliary: The liver appears normal. The gallbladder contains numerous calcified gallstones. There is no evidence of gallbladder inflammation or biliary ductal dilatation. Pancreas: The pancreas is atrophic and demonstrates extensive parenchymal calcifications, consistent with chronic pancreatitis.  Spleen: Within normal limits in size and appearance. Adrenals/Urinary Tract: Adrenal glands and kidneys are unremarkable bilaterally. The bladder has a normal appearance. Stomach/Bowel: There is a large hiatal hernia. Bowel shows no evidence of obstruction, inflammation or perforation. Diverticulosis of the descending and sigmoid colon present without evidence of diverticulitis. No free air or abnormal fluid collections. Vascular/Lymphatic: No pathologically enlarged lymph nodes. The abdominal aorta is diffusely calcified without evidence of of aneurysm. Other: No hernias identified. Musculoskeletal: No evidence of fractures. Moderate disc space narrowing is present at L5-S1. IMPRESSION: 1. No acute injuries identified in the abdomen or pelvis. 2. Cholelithiasis. 3. Evidence of calcified chronic pancreatitis. 4. Large hiatal hernia. 5. Aortic atherosclerosis without evidence of aneurysm. Electronically Signed   By: Aletta Edouard M.D.   On: 05/10/2016 13:56   Dg Pelvis Portable  Result Date: 05/10/2016 CLINICAL DATA:  Trauma. EXAM: PORTABLE PELVIS 1-2 VIEWS COMPARISON:  None. FINDINGS: There is no evidence of pelvic fracture or diastasis. No pelvic bone lesions are seen. Hips show normal alignment bilaterally without evidence of fracture or dislocation. IMPRESSION: Negative. Electronically Signed   By: Aletta Edouard M.D.   On: 05/10/2016 13:36   Dg Chest Portable 1 View  Result Date: 05/10/2016 CLINICAL DATA:  Fall. EXAM: PORTABLE CHEST 1 VIEW COMPARISON:  None. FINDINGS: The heart  is mildly enlarged. There is no evidence of pulmonary edema, consolidation, pneumothorax, nodule or pleural fluid. The visualized skeletal structures are unremarkable. IMPRESSION: No acute findings in the chest.  The heart is mildly enlarged. Electronically Signed   By: Aletta Edouard M.D.   On: 05/10/2016 13:35    Vincent Lope, DO 05/11/2016, 7:07 AM PGY-1, [place] Intern pager: [phone], text pages  welcome

## 2016-05-11 NOTE — Progress Notes (Signed)
Pharmacy Antibiotic Note  Add [name]. is a 75 y.o. male admitted on 05/10/2016 with possible sepsis.  Pharmacy has been consulted for vancomycin and Zosyn dosing.  Patient's renal function is improving.  Plan: - Continue vanc 750mg  IV Q24H - Change Zosyn to 3.375gm IV Q8H, 4 hr infusion - Monitor renal fxn, micro data, vanc trough as indicated - Consider resuming home med baclofen, and reducing gabapentin dose during AKI   Height: 5\' 9"  (175.3 cm) Weight: 153 lb (69.4 kg) IBW/kg (Calculated) : 70.7  Temp (24hrs), Avg:97.7 F (36.5 C), Min:93.1 F (33.9 C), Max:98.9 F (37.2 C)   Recent Labs Lab 05/10/16 1259 05/10/16 1311 05/10/16 1904 05/11/16 0345 05/11/16 1210  WBC 14.1*  --   --  10.7* 10.4  CREATININE 3.66*  --   --  2.39*  --   LATICACIDVEN  --  3.02* 1.5  --   --     Estimated Creatinine Clearance: 26.2 mL/min (by C-G formula based on SCr of 2.39 mg/dL).    Not on File  Antimicrobials this admission: Vanc 8/9 >> Zosyn 8/9 >>  Dose adjustments this admission: N/A  Microbiology results: 8/9 MRSA PCR - negative 8/9 UCx -  8/9 BCx x2 -   Chrisann Melaragno D. Mina Marble, PharmD, BCPS Pager:  [phone] 05/11/2016, 1:12 PM

## 2016-05-12 DIAGNOSIS — S0101XD Laceration without foreign body of scalp, subsequent encounter: Secondary | ICD-10-CM

## 2016-05-12 DIAGNOSIS — W19XXXD Unspecified fall, subsequent encounter: Secondary | ICD-10-CM

## 2016-05-12 LAB — BASIC METABOLIC PANEL
Anion gap: 6 (ref 5–15)
BUN: 14 mg/dL (ref 6–20)
CHLORIDE: 108 mmol/L (ref 101–111)
CO2: 23 mmol/L (ref 22–32)
Calcium: 8.4 mg/dL — ABNORMAL LOW (ref 8.9–10.3)
Creatinine, Ser: 1.2 mg/dL (ref 0.61–1.24)
GFR calc Af Amer: >60 mL/min (ref >60)
GFR calc non Af Amer: 57 mL/min — ABNORMAL LOW (ref >60)
GLUCOSE: 195 mg/dL — AB (ref 65–99)
POTASSIUM: 3.3 mmol/L — AB (ref 3.5–5.1)
Sodium: 137 mmol/L (ref 135–145)

## 2016-05-12 LAB — URINE CULTURE: Culture: <10000 — AB

## 2016-05-12 LAB — LIPID PANEL
CHOL/HDL RATIO: 3.7 ratio
Cholesterol: 70 mg/dL (ref 0–200)
HDL: 19 mg/dL — AB (ref >40)
LDL Cholesterol: 29 mg/dL (ref 0–99)
Triglycerides: 112 mg/dL (ref <150)
VLDL: 22 mg/dL (ref 0–40)

## 2016-05-12 LAB — TYPE AND SCREEN
ABO/RH(D): O POS
ANTIBODY SCREEN: NEGATIVE
UNIT DIVISION: 0
UNIT DIVISION: 0
Unit division: 0

## 2016-05-12 LAB — GLUCOSE, CAPILLARY
Glucose-Capillary: 204 mg/dL — ABNORMAL HIGH (ref 65–99)
Glucose-Capillary: 167 mg/dL — ABNORMAL HIGH (ref 65–99)
Glucose-Capillary: 57 mg/dL — ABNORMAL LOW (ref 65–99)
Glucose-Capillary: 110 mg/dL — ABNORMAL HIGH (ref 65–99)

## 2016-05-12 LAB — TROPONIN I

## 2016-05-12 LAB — CBC
HEMATOCRIT: 23.5 % — AB (ref 39.0–52.0)
Hemoglobin: 7.3 g/dL — ABNORMAL LOW (ref 13.0–17.0)
MCH: 25.3 pg — ABNORMAL LOW (ref 26.0–34.0)
MCHC: 31.1 g/dL (ref 30.0–36.0)
MCV: 81.3 fL (ref 78.0–100.0)
Platelets: 235 10*3/uL (ref 150–400)
RBC: 2.89 MIL/uL — ABNORMAL LOW (ref 4.22–5.81)
RDW: 19 % — ABNORMAL HIGH (ref 11.5–15.5)
WBC: 9.4 10*3/uL (ref 4.0–10.5)

## 2016-05-12 MED ORDER — BACLOFEN 10 MG PO TABS
10.0000 mg | ORAL_TABLET | Freq: Two times a day (BID) | ORAL | Status: DC
  Administered 2016-05-12 – 2016-05-15 (×7): 10 mg via ORAL
  Filled 2016-05-12 (×7): qty 1

## 2016-05-12 MED ORDER — LEVOFLOXACIN 500 MG PO TABS
500.0000 mg | ORAL_TABLET | Freq: Every day | ORAL | Status: AC
  Administered 2016-05-12 – 2016-05-13 (×2): 500 mg via ORAL
  Filled 2016-05-12 (×2): qty 1

## 2016-05-12 MED ORDER — POTASSIUM CHLORIDE CRYS ER 20 MEQ PO TBCR
40.0000 meq | EXTENDED_RELEASE_TABLET | Freq: Once | ORAL | Status: AC
  Administered 2016-05-12: 40 meq via ORAL
  Filled 2016-05-12: qty 2

## 2016-05-12 MED ORDER — NICOTINE 14 MG/24HR TD PT24
14.0000 mg | MEDICATED_PATCH | Freq: Every day | TRANSDERMAL | Status: DC
  Administered 2016-05-12 – 2016-05-13 (×2): 14 mg via TRANSDERMAL
  Filled 2016-05-12 (×3): qty 1

## 2016-05-12 NOTE — Progress Notes (Signed)
Initial Nutrition Assessment  DOCUMENTATION CODES:   Non-severe (moderate) malnutrition in context of chronic illness  INTERVENTION:  Continue Ensure Enlive po BID, each supplement provides 350 kcal and 20 grams of protein.  Encourage adequate PO intake.   NUTRITION DIAGNOSIS:   Malnutrition related to chronic illness as evidenced by moderate depletions of muscle mass, moderate depletion of body fat.  GOAL:   Patient will meet greater than or equal to 90% of their needs  MONITOR:   PO intake, Supplement acceptance, Labs, Weight trends, Skin, I & O's  REASON FOR ASSESSMENT:   Malnutrition Screening Tool    ASSESSMENT:   75 y.o. male presenting after fall . PMH is significant for HTN, DM2, CAD, CVA, COPD, ETOH use, tobacco disorder, protein calorie malnutrition, dementia  Meal completion has been 40-75%. Pt reports appetite is just "ok" during time of visit. Pt reports eating well PTA with usual consumption of at least 3 meals a day with Ensure shakes at least three times daily. Usual body weight unknown to patient. No weight record in Epic. Pt currently has Ensure ordered and has been consuming them. RD to continue with current orders. Pt encouraged to eat his food at meals and to drink his supplements.   Nutrition-Focused physical exam completed. Findings are moderate fat depletion, moderate muscle depletion, and no edema.   Labs and medications reviewed.   Diet Order:  Diet heart healthy/carb modified Room service appropriate? Yes; Fluid consistency: Thin  Skin:   (Laceration on head)  Last BM:  Unknown  Height:   Ht Readings from Last 1 Encounters:  05/10/16 5\' 9"  (1.753 m)    Weight:   Wt Readings from Last 1 Encounters:  05/12/16 163 lb 12.8 oz (74.3 kg)    Ideal Body Weight:  72.7 kg  BMI:  Body mass index is 24.19 kg/m.  Estimated Nutritional Needs:   Kcal:  1900-2100  Protein:  90-100 grams  Fluid:  1.9 - 2.1 L/day  EDUCATION NEEDS:   No  education needs identified at this time  Corrin Parker, MS, RD, LDN Pager # [phone] After hours/ weekend pager # [phone]

## 2016-05-12 NOTE — Progress Notes (Signed)
Family Medicine Teaching Service Daily Progress Note Intern Pager: [phone]  Patient name: [name]. Medical record number: [id number] Date of birth: [birth year] Age: 75 y.o. Gender: male  Primary Care Provider: No PCP Per Patient Consultants: none Code Status: FULL  Pt Overview and Major Events to Date:  8/9 Admitted to SDU for sepsis of unknown source  Assessment and Plan: [name]. is a 75 y.o. male presenting after fall . PMH is significant for HTN, DM2, CAD, CVA, COPD, ETOH use, tobacco disorder, protein calorie malnutrition, dementia  Sepsis - Unclear etiology, potential UTI given UA showing few bacteria, numerous WBC, mod leuks but neg for nitrites and [id number] squamous so uncertain. Lactic acid 3 on admit now 1.5. Afebrile, no further hypothermic temps since admit. Denies dysuria, urinary frequency or urgency. Of note recent ED 04/29/16 visit for UTI with keflex as outpatient. - DC vanc/zosyn (8/9-8/11), po levaquin 500mg  qd for 2 more days - follow blood culture and urine studies - of note received BCx result from previous admission which showed Micrococcus species in 1/2 bottles- which is likely a contaminant  Tachycardia in setting of h/o CAD, improving. Likely 2/2 anemia.  Denies chest pain. Troponin <0.03 x3  On Echo 05/12/16 EF 55-60% without regional wall abnormalities improved from echo in 2013. EKG this morning showing NSR with 1 PVC. - monitor on tele  Asymptomatic Anemia. baseline Hgb 9-11. On admit 8.0 from scalp laceration, then decreased to 6.4 likely 2/2 hemodiluation given decrease across all cell lines overnight with aggressive IV, given 1U pRBC yesterday 05/11/16 with improvement to 7.9. Hgb now 7.3 - monitor CBC - transfuse <8 - restarted ASA81 and heparin s/p transfusion  Fall- Unclear cause of fall with as patient is a poor historian and this was an unwitnessed event. CT head negative for acute pathology, oriented x4 in the ED. History of possible  dizziness concerning for syncopal etiology however as he reports he tripped the fall is likely mechanical in nature.  - telemetry - PT/OT eval, appreciate recommendations - falls precautions - staples were used to head laceration, remove in 7-10 days (on 05/17/16-05/19/16)  AKI, improving: Cr 3.66 on admit, improved to 1.2 with baseline 0.6-0.9. Likely due to dehydration given improvement with s/p 1L bolus - Continue MIVF - Avoid nephrotoxic agents - monitor BMP    DM2: Recent admission for hypoglycemia but blood glucose has been within normal limits during this hospital stay. Last a1c 6.8 on 05/05/16 - MIVFs - SSI sensitive, ACHS - Neurontin 300 TID   HTN: Hypotensive on admission to 58/palpable improved with IVF to 120s/60s-70s. This am BP 131/70.   - Hold home antihypertensives - continue MIVF, bolus as needed - monitor BP  h/o CVA. On Zocor 5. 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 Stable - Continue statin - restart home baclofen    COPD, not in exacerbation - Monitor - O2 prn    Protein calorie malnutrition  - Consulted nutrition for assessment at last visit  Depression - continue home abilify  FEN/GI: heart/carb modified, pepcid Prophylaxis: SCDs  Disposition: pending  Subjective:  States feels well this am. Has no complaints this morning. States eating well. Denies CP or SOB. Continues to deny urinary symptoms.  Objective: Temp:  [97.8 F (36.6 C)-99 F (37.2 C)] 98 F (36.7 C) (08/11 0400) Pulse Rate:  [94-110] 106 (08/11 0400) Resp:  [11-22] 19 (08/11 0600) BP: (122-164)/(69-88) 135/70 (08/11 0600) SpO2:  [91 %-100 %] 92 % (08/11 0600) Weight:  [id number]  lb 12.8 oz (74.3 kg)] 163 lb 12.8 oz (74.3 kg) (08/11 0400) Physical Exam: General: lying in bed, alert, in NAD Cardiovascular: tachycardic, no murmurs appreciated Respiratory: CTAB, normal effort Abdomen: soft, non tender, +BS Extremities: left hand contracture Neuro: alert & oriented, no focal  deficits Skin: laceration on occiput with staples intact  Laboratory:  Recent Labs Lab 05/10/16 1259 05/11/16 0345 05/11/16 1210  WBC 14.1* 10.7* 10.4  HGB 8.0* 6.4* 7.9*  HCT 26.5* 21.1* 25.4*  PLT 309 259 238    Recent Labs Lab 05/10/16 1259 05/11/16 0345  NA 135 139  K 4.8 3.7  CL 105 111  CO2 19* 22  BUN 47* 36*  CREATININE 3.66* 2.39*  CALCIUM 8.8* 8.1*  PROT 6.0*  --   BILITOT 0.5  --   ALKPHOS 64  --   ALT 20  --   AST 31  --   GLUCOSE 296* 137*   Urinalysis    Component Value Date/Time   COLORURINE YELLOW 05/10/2016 1741   APPEARANCEUR CLOUDY (A) 05/10/2016 1741   LABSPEC 1.042 (H) 05/10/2016 1741   PHURINE 5.0 05/10/2016 1741   GLUCOSEU NEGATIVE 05/10/2016 1741   HGBUR MODERATE (A) 05/10/2016 1741   BILIRUBINUR NEGATIVE 05/10/2016 1741   KETONESUR NEGATIVE 05/10/2016 1741   PROTEINUR NEGATIVE 05/10/2016 1741   NITRITE NEGATIVE 05/10/2016 1741   LEUKOCYTESUR MODERATE (A) 05/10/2016 1741     Imaging/Diagnostic Tests: Ct Head Wo Contrast  Result Date: 05/10/2016 CLINICAL DATA:  Fall with posterior scalp laceration and head injury. EXAM: CT HEAD WITHOUT CONTRAST CT CERVICAL SPINE WITHOUT CONTRAST TECHNIQUE: Multidetector CT imaging of the head and cervical spine was performed following the standard protocol without intravenous contrast. Multiplanar CT image reconstructions of the cervical spine were also generated. COMPARISON:  None. FINDINGS: CT HEAD FINDINGS The brain shows moderately advanced small vessel ischemic changes in the periventricular white matter. There is an old right-sided posterior cerebral territory infarct involving the occipital lobe. Old lacunar infarct identified in the right thalamus. The brain demonstrates no evidence of hemorrhage, infarction, edema, mass effect, extra-axial fluid collection, hydrocephalus or mass lesion. Skin staples are present at the posterior vertex. No underlying skull fracture identified. No evidence of soft  tissue foreign body. Mucosal thickening is present in left-sided frontal air cells. CT CERVICAL SPINE FINDINGS The cervical spine shows normal alignment. There is no evidence of acute fracture or subluxation. No soft tissue swelling or hematoma is identified. Advanced spondylosis noted throughout the cervical spine with near complete bony fusion at the C3-4 and C4-5 levels and severe disc space narrowing at the C5-6 and C6-7 levels. No bony or soft tissue lesions are seen. The visualized airway is normally patent. IMPRESSION: 1. Scalp laceration at the posterior vertex without evidence of skull fracture or underlying brain injury. Advanced small vessel ischemic disease present as well as old infarct of the right occipital lobe. 2. No evidence of acute cervical fracture. 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 Electronically Signed   By: Aletta Edouard M.D.   On: 05/10/2016 13:52   Ct Cervical Spine Wo Contrast  Result Date: 05/10/2016 CLINICAL DATA:  Fall with posterior scalp laceration and head injury. EXAM: CT HEAD WITHOUT CONTRAST CT CERVICAL SPINE WITHOUT CONTRAST TECHNIQUE: Multidetector CT imaging of the head and cervical spine was performed following the standard protocol without intravenous contrast. Multiplanar CT image reconstructions of the cervical spine were also generated. COMPARISON:  None. FINDINGS: CT HEAD FINDINGS The  brain shows moderately advanced small vessel ischemic changes in the periventricular white matter. There is an old right-sided posterior cerebral territory infarct involving the occipital lobe. Old lacunar infarct identified in the right thalamus. The brain demonstrates no evidence of hemorrhage, infarction, edema, mass effect, extra-axial fluid collection, hydrocephalus or mass lesion. Skin staples are present at the posterior vertex. No underlying skull fracture identified. No evidence of soft tissue foreign body. Mucosal thickening  is present in left-sided frontal air cells. CT CERVICAL SPINE FINDINGS The cervical spine shows normal alignment. There is no evidence of acute fracture or subluxation. No soft tissue swelling or hematoma is identified. Advanced spondylosis noted throughout the cervical spine with near complete bony fusion at the C3-4 and C4-5 levels and severe disc space narrowing at the C5-6 and C6-7 levels. No bony or soft tissue lesions are seen. The visualized airway is normally patent. IMPRESSION: 1. Scalp laceration at the posterior vertex without evidence of skull fracture or underlying brain injury. Advanced small vessel ischemic disease present as well as old infarct of the right occipital lobe. 2. No evidence of acute cervical fracture. 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 Electronically Signed   By: Aletta Edouard M.D.   On: 05/10/2016 13:52   Ct Abdomen Pelvis W Contrast  Result Date: 05/10/2016 CLINICAL DATA:  Fall.  Hypotensive. EXAM: CT ABDOMEN AND PELVIS WITH CONTRAST TECHNIQUE: Multidetector CT imaging of the abdomen and pelvis was performed using the standard protocol following bolus administration of intravenous contrast. CONTRAST:  176mL ISOVUE-300 IOPAMIDOL (ISOVUE-300) INJECTION 61% COMPARISON:  None. FINDINGS: Lower chest:  Bibasilar scarring present. Hepatobiliary: The liver appears normal. The gallbladder contains numerous calcified gallstones. There is no evidence of gallbladder inflammation or biliary ductal dilatation. Pancreas: The pancreas is atrophic and demonstrates extensive parenchymal calcifications, consistent with chronic pancreatitis. Spleen: Within normal limits in size and appearance. Adrenals/Urinary Tract: Adrenal glands and kidneys are unremarkable bilaterally. The bladder has a normal appearance. Stomach/Bowel: There is a large hiatal hernia. Bowel shows no evidence of obstruction, inflammation or perforation. Diverticulosis of the  descending and sigmoid colon present without evidence of diverticulitis. No free air or abnormal fluid collections. Vascular/Lymphatic: No pathologically enlarged lymph nodes. The abdominal aorta is diffusely calcified without evidence of of aneurysm. Other: No hernias identified. Musculoskeletal: No evidence of fractures. Moderate disc space narrowing is present at L5-S1. IMPRESSION: 1. No acute injuries identified in the abdomen or pelvis. 2. Cholelithiasis. 3. Evidence of calcified chronic pancreatitis. 4. Large hiatal hernia. 5. Aortic atherosclerosis without evidence of aneurysm. Electronically Signed   By: Aletta Edouard M.D.   On: 05/10/2016 13:56   Dg Pelvis Portable  Result Date: 05/10/2016 CLINICAL DATA:  Trauma. EXAM: PORTABLE PELVIS 1-2 VIEWS COMPARISON:  None. FINDINGS: There is no evidence of pelvic fracture or diastasis. No pelvic bone lesions are seen. Hips show normal alignment bilaterally without evidence of fracture or dislocation. IMPRESSION: Negative. Electronically Signed   By: Aletta Edouard M.D.   On: 05/10/2016 13:36   Dg Chest Portable 1 View  Result Date: 05/10/2016 CLINICAL DATA:  Fall. EXAM: PORTABLE CHEST 1 VIEW COMPARISON:  None. FINDINGS: The heart is mildly enlarged. There is no evidence of pulmonary edema, consolidation, pneumothorax, nodule or pleural fluid. The visualized skeletal structures are unremarkable. IMPRESSION: No acute findings in the chest.  The heart is mildly enlarged. Electronically Signed   By: Aletta Edouard M.D.   On: 05/10/2016 13:35    Turner Kunzman Nigel Sloop,  DO 05/12/2016, 6:48 AM PGY-1, [facility] Intern pager: [phone], text pages welcome

## 2016-05-12 NOTE — Discharge Summary (Signed)
[facility] Discharge Summary  Patient name: [name]. Medical record number: [id number] Date of birth: 10/06/1940 Age: 75 y.o. Gender: male Date of Admission: 05/10/2016  Date of Discharge: 05/15/16 Admitting Physician: Kinnie Feil, MD  Primary Care Provider: No PCP Per Patient Consultants: none  Indication for Hospitalization: Sepsis of unknown source  Discharge Diagnoses/Problem List:  Sepsis of unknown source Tachycardia in the setting of h/o CAD Asymptomatic anemia s/p fall and scalp laceration AKI HTN H/o CVA DM, type 2 COPD Severe protein-calorie malnutrition Depression  Disposition: Assisted living facility  Discharge Condition: Stable, improved  Discharge Exam:  General: sitting up in bed, in NAD Cardiovascular: RRR, no murmurs appreciated Respiratory: CTAB, normal effort Abdomen: soft, non tender, +BS Extremities: left hand contracture Neuro: alert, no focal deficits besides limited movement of left arm Skin: laceration on occiput with staples intact  Brief Hospital Course:  [name]. is a 75 y.o. male presenting after fall . PMH is significant for HTN, DM2, CAD, CVA, COPD, ETOH use, tobacco disorder, protein calorie malnutrition, dementia  Scalp laceration s/p fall with subsequent asymptomaticanemia.  Patient is from [facility], was in usual state of health on day of presentation when refused breakfast and locked himself in his room for approximately 5 hours. Was found down unresponsive on the floor in a pool of blood where he had hit his head. EMS was called at which time his blood glucose was 368. He became responsive with EMS and continued to improve in the ED. Patient stated that he remembered going to turn TV off and tripping before falling, so this was suspected to be a mechanical fall. His baseline Hgb is around 9-11 and on admit was 8.0 from scalp laceration. After receving fluid boluses for  suspected sepsis, Hgb then decreased to 6.4 likely 2/2 hemodiluation given decrease across all cell lines overnight with aggressive IV. He was given 1U pRBC with improvement to Hgb in high 7s and then needed an additional 1U pRBC. Hgb monitored during hospital stay and at discharge was stable at 9.1. He will need staples removed on 05/17/16 - 05/19/16.  Sepsis of unknown source  On presentation to the ED, patient was hypotensive and hypothermic with WBC 14.1 and lactic acid 3 so was given aggressive IV hydration and started on vanc/zosyn. It was thought that possible source was a UTI since urinalysis showed few bacteria, numerous WBC, mod leuks but neg for nitrites and [id number] squamous so uncertain. Of note, patient had recent ED 04/29/16 visit for UTI with keflex as outpatient. During his hospital stay, lactic acid trended down and patient was afebrile without further hypothermic temperatures. He denied urinary symptoms including dysuria, urinary frequency or urgency so was transitioned to po levaquin for a total of 5 days on ABX. Blood and urine cultures did not show any growth this hospital stay. Patient clinically remained stable on ABX.   Tachycardia in setting of h/o CAD Patient developed tachycardia in the setting of hypotension on admission but remained in HR low 100s even after BP improved. Likely  Some component of his asymptomatic anemia vs sepsis of unknown source. He denied chest pain, palpitations, SOB during his entire hospital stay and his troponins were <0.03 x3. He had an echo on 05/12/16 EF 55-60% without regional wall abnormalities improved from echo in 2013. He was monitored on telemetry and remained stable.   The remainder of his chronic medical conditions were stable and changes were made to his  home medications.  Issues for Follow Up:  1. Please removed staples for scalp laceration on 05/17/16-05/20/16 2. Pleas monitor blood sugar off insulin and on metformin only 3. Patient home lasix  was discontinued since his echo this hospital stay showed improvement and home lasix was thought to possibly contribute to falls  Significant Procedures: none  Significant Labs and Imaging:   Recent Labs Lab 05/12/16 0858 05/13/16 0315 05/14/16 0452 05/15/16 0205  WBC 9.4 15.0* 12.2*  --   HGB 7.3* 8.1* 7.5* 9.1*  HCT 23.5* 25.3* 24.0* 28.4*  PLT 235 254 257  --     Recent Labs Lab 05/10/16 1259 05/11/16 0345 05/12/16 0858 05/13/16 0315 05/14/16 0452  NA 135 139 137 137 138  K 4.8 3.7 3.3* 3.6 3.3*  CL 105 111 108 105 103  CO2 19* 22 23 20* 25  GLUCOSE 296* 137* 195* 171* 146*  BUN 47* 36* 14 11 9   CREATININE 3.66* 2.39* 1.20 1.03 1.00  CALCIUM 8.8* 8.1* 8.4* 9.0 8.7*  ALKPHOS 64  --   --   --   --   AST 31  --   --   --   --   ALT 20  --   --   --   --   ALBUMIN 2.8*  --   --   --   --    Transthoracic Echocardiography Study date: 05/11/2016.  Study Conclusions  - Left ventricle: The cavity size was normal. There was moderate concentric hypertrophy. Systolic function was normal. The estimated ejection fraction was in the range of 55% to 60%. Wall motion was normal; there were no regional wall motion   abnormalities. - Aortic valve: Transvalvular velocity was within the normal range. There was no stenosis. There was no regurgitation. - Mitral valve: There was mild regurgitation. - Right ventricle: The cavity size was normal. Wall thickness was normal. Systolic function was normal. - Tricuspid valve: There was trivial regurgitation.  Ct Head Wo Contrast Result Date: 05/10/2016 CLINICAL DATA:  Fall with posterior scalp laceration and head injury. EXAM: CT HEAD WITHOUT CONTRAST CT CERVICAL SPINE WITHOUT CONTRAST TECHNIQUE: Multidetector CT imaging of the head and cervical spine was performed following the standard protocol without intravenous contrast. Multiplanar CT image reconstructions of the cervical spine were also generated. COMPARISON:  None. FINDINGS: CT HEAD  FINDINGS The brain shows moderately advanced small vessel ischemic changes in the periventricular white matter. There is an old right-sided posterior cerebral territory infarct involving the occipital lobe. Old lacunar infarct identified in the right thalamus. The brain demonstrates no evidence of hemorrhage, infarction, edema, mass effect, extra-axial fluid collection, hydrocephalus or mass lesion. Skin staples are present at the posterior vertex. No underlying skull fracture identified. No evidence of soft tissue foreign body. Mucosal thickening is present in left-sided frontal air cells. CT CERVICAL SPINE FINDINGS The cervical spine shows normal alignment. There is no evidence of acute fracture or subluxation. No soft tissue swelling or hematoma is identified. Advanced spondylosis noted throughout the cervical spine with near complete bony fusion at the C3-4 and C4-5 levels and severe disc space narrowing at the C5-6 and C6-7 levels. No bony or soft tissue lesions are seen. The visualized airway is normally patent. IMPRESSION: 1. Scalp laceration at the posterior vertex without evidence of skull fracture or underlying brain injury. Advanced small vessel ischemic disease present as well as old infarct of the right occipital lobe. 2. No evidence of acute cervical fracture. Advanced cervical spondylosis present with  near complete bony fusion at C3-4 and C4-5. Electronically Signed   By: Aletta Edouard M.D.   On: 05/10/2016 13:52   Ct Cervical Spine Wo Contrast Result Date: 05/10/2016 CLINICAL DATA:  Fall with posterior scalp laceration and head injury. EXAM: CT HEAD WITHOUT CONTRAST CT CERVICAL SPINE WITHOUT CONTRAST TECHNIQUE: Multidetector CT imaging of the head and cervical spine was performed following the standard protocol without intravenous contrast. Multiplanar CT image reconstructions of the cervical spine were also generated. COMPARISON:  None. FINDINGS: CT HEAD FINDINGS The brain shows moderately  advanced small vessel ischemic changes in the periventricular white matter. There is an old right-sided posterior cerebral territory infarct involving the occipital lobe. Old lacunar infarct identified in the right thalamus. The brain demonstrates no evidence of hemorrhage, infarction, edema, mass effect, extra-axial fluid collection, hydrocephalus or mass lesion. Skin staples are present at the posterior vertex. No underlying skull fracture identified. No evidence of soft tissue foreign body. Mucosal thickening is present in left-sided frontal air cells. CT CERVICAL SPINE FINDINGS The cervical spine shows normal alignment. There is no evidence of acute fracture or subluxation. No soft tissue swelling or hematoma is identified. Advanced spondylosis noted throughout the cervical spine with near complete bony fusion at the C3-4 and C4-5 levels and severe disc space narrowing at the C5-6 and C6-7 levels. No bony or soft tissue lesions are seen. The visualized airway is normally patent. IMPRESSION: 1. Scalp laceration at the posterior vertex without evidence of skull fracture or underlying brain injury. Advanced small vessel ischemic disease present as well as old infarct of the right occipital lobe. 2. No evidence of acute cervical fracture. 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 Electronically Signed   By: Aletta Edouard M.D.   On: 05/10/2016 13:52   Ct Abdomen Pelvis W Contrast Result Date: 05/10/2016 CLINICAL DATA:  Fall.  Hypotensive. EXAM: CT ABDOMEN AND PELVIS WITH CONTRAST TECHNIQUE: Multidetector CT imaging of the abdomen and pelvis was performed using the standard protocol following bolus administration of intravenous contrast. CONTRAST:  157mL ISOVUE-300 IOPAMIDOL (ISOVUE-300) INJECTION 61% COMPARISON:  None. FINDINGS: Lower chest:  Bibasilar scarring present. Hepatobiliary: The liver appears normal. The gallbladder contains numerous calcified gallstones.  There is no evidence of gallbladder inflammation or biliary ductal dilatation. Pancreas: The pancreas is atrophic and demonstrates extensive parenchymal calcifications, consistent with chronic pancreatitis. Spleen: Within normal limits in size and appearance. Adrenals/Urinary Tract: Adrenal glands and kidneys are unremarkable bilaterally. The bladder has a normal appearance. Stomach/Bowel: There is a large hiatal hernia. Bowel shows no evidence of obstruction, inflammation or perforation. Diverticulosis of the descending and sigmoid colon present without evidence of diverticulitis. No free air or abnormal fluid collections. Vascular/Lymphatic: No pathologically enlarged lymph nodes. The abdominal aorta is diffusely calcified without evidence of of aneurysm. Other: No hernias identified. Musculoskeletal: No evidence of fractures. Moderate disc space narrowing is present at L5-S1. IMPRESSION: 1. No acute injuries identified in the abdomen or pelvis. 2. Cholelithiasis. 3. Evidence of calcified chronic pancreatitis. 4. Large hiatal hernia. 5. Aortic atherosclerosis without evidence of aneurysm. Electronically Signed   By: Aletta Edouard M.D.   On: 05/10/2016 13:56   Dg Pelvis Portable Result Date: 05/10/2016 CLINICAL DATA:  Trauma. EXAM: PORTABLE PELVIS 1-2 VIEWS COMPARISON:  None. FINDINGS: There is no evidence of pelvic fracture or diastasis. No pelvic bone lesions are seen. Hips show normal alignment bilaterally without evidence of fracture or dislocation. IMPRESSION: Negative. Electronically Signed   By: Eulas Post  Kathlene Cote M.D.   On: 05/10/2016 13:36   Dg Chest Portable 1 View Result Date: 05/10/2016 CLINICAL DATA:  Fall. EXAM: PORTABLE CHEST 1 VIEW COMPARISON:  None. FINDINGS: The heart is mildly enlarged. There is no evidence of pulmonary edema, consolidation, pneumothorax, nodule or pleural fluid. The visualized skeletal structures are unremarkable. IMPRESSION: No acute findings in the chest.  The heart is  mildly enlarged. Electronically Signed   By: Aletta Edouard M.D.   On: 05/10/2016 13:35   Results/Tests Pending at Time of Discharge: none  Discharge Medications:    Medication List    STOP taking these medications   furosemide 40 MG tablet Commonly known as:  LASIX   HUMALOG KWIKPEN 100 UNIT/ML KiwkPen Generic drug:  insulin lispro   insulin aspart 100 UNIT/ML injection Commonly known as:  novoLOG   insulin glargine 100 UNIT/ML injection Commonly known as:  LANTUS   omeprazole 20 MG capsule Commonly known as:  PRILOSEC   simvastatin 5 MG tablet Commonly known as:  ZOCOR     TAKE these medications   amLODipine 2.5 MG tablet Commonly known as:  NORVASC Take 2.5 mg by mouth daily.   ARIPiprazole 2 MG tablet Commonly known as:  ABILIFY Take 2 mg by mouth daily.   ARTIFICIAL TEARS 0.1-0.3 % Soln Apply 2 drops to eye daily.   aspirin 81 MG tablet Take 81 mg by mouth daily.   baclofen 10 MG tablet Commonly known as:  LIORESAL Take 10 mg by mouth 2 (two) times daily.   famotidine 20 MG tablet Commonly known as:  PEPCID Take 20 mg by mouth 2 (two) times daily.   folic acid 1 MG tablet Commonly known as:  FOLVITE Take 1 mg by mouth daily.   gabapentin 300 MG capsule Commonly known as:  NEURONTIN Take 300 mg by mouth 3 (three) times daily.   Melatonin 3 MG Caps Take 3 mg by mouth at bedtime.   metFORMIN 500 MG tablet Commonly known as:  GLUCOPHAGE Take 500 mg by mouth 2 (two) times daily with a meal.   nitroGLYCERIN 0.4 MG SL tablet Commonly known as:  NITROSTAT Place 0.4 mg under the tongue every 5 (five) minutes as needed for chest pain.   ondansetron 4 MG tablet Commonly known as:  ZOFRAN Take 4 mg by mouth every 8 (eight) hours as needed for nausea or vomiting.   potassium chloride 10 MEQ tablet Commonly known as:  K-DUR,KLOR-CON Take 10 mEq by mouth daily.   rosuvastatin 20 MG tablet Commonly known as:  CRESTOR Take 1 tablet (20 mg total) by  mouth daily.   thiamine 100 MG tablet Commonly known as:  VITAMIN B-1 Take 100 mg by mouth daily.   Vitamin D 2000 units tablet Take 2,000 Units by mouth daily.       Discharge Instructions: Please refer to Patient Instructions section of EMR for full details.  Patient was counseled important signs and symptoms that should prompt return to medical care, changes in medications, dietary instructions, activity restrictions, and follow up appointments.   Follow-Up Appointments: None since patient is going to assisted living facility  Bufford Lope, DO 05/15/2016, 12:17 PM PGY-1, [place]

## 2016-05-12 NOTE — Progress Notes (Signed)
Hypoglycemic Event  CBG: 57  Treatment: 15 GM carbohydrate snack  Symptoms: None  Follow-up CBG: Time:11:17 PM  CBG Result:110  Possible Reasons for Event: unknown  Comments/MD notified:    Reap, Jon Gills

## 2016-05-12 NOTE — ED Provider Notes (Signed)
[facility] DEPT Provider Note   CSN: [id number] Arrival date & time: 05/10/16  1248  First Provider Contact:  First MD Initiated Contact with Patient 05/10/16 1245        History   Chief Complaint Chief Complaint  Patient presents with  . level 1 trauma   Level V caveat: Acuity of condition, mental status change  HPI [name]. is a 75 y.o. male.   HPI Patient presents to the emergency department as level I trauma after being found down at the nursing home with a scalp laceration with significant blood everywhere and a blood pressure of 60 systolic.  He comes from a nursing facility and there is no additional report from the nursing facility at this time.  He has a history of prior stroke and has a contracted left upper extremity.  EMS report no weakness of the legs at this time.  As reported that the fall was unwitnessed.  No other information is available at time of initial evaluation.    Past Medical History:  Diagnosis Date  . Diabetes mellitus without complication [facility])     Patient Active Problem List   Diagnosis Date Noted  . Absolute anemia   . Scalp laceration   . Fall   . Type 2 diabetes mellitus with complication, without long-term current use of insulin ([place])   . AKI (acute kidney injury) ([place]) 05/10/2016  . Sepsis ([place]) 05/10/2016    History reviewed. No pertinent surgical history.     Home Medications    Prior to Admission medications   Medication Sig Start Date End Date Taking? Authorizing Provider  amLODipine (NORVASC) 2.5 MG tablet Take 2.5 mg by mouth daily.   Yes Historical Provider, MD  ARIPiprazole (ABILIFY) 2 MG tablet Take 2 mg by mouth daily.   Yes Historical Provider, MD  ARTIFICIAL TEARS 0.1-0.3 % SOLN Apply 2 drops to eye daily.   Yes Historical Provider, MD  aspirin 81 MG tablet Take 81 mg by mouth daily.   Yes Historical Provider, MD  baclofen (LIORESAL) 10 MG tablet Take 10 mg by mouth 2 (two) times daily.   Yes Historical  Provider, MD  Cholecalciferol (VITAMIN D) 2000 units tablet Take 2,000 Units by mouth daily.   Yes Historical Provider, MD  famotidine (PEPCID) 20 MG tablet Take 20 mg by mouth 2 (two) times daily.   Yes Historical Provider, MD  folic acid (FOLVITE) 1 MG tablet Take 1 mg by mouth daily.   Yes Historical Provider, MD  furosemide (LASIX) 40 MG tablet Take 40 mg by mouth.   Yes Historical Provider, MD  gabapentin (NEURONTIN) 300 MG capsule Take 300 mg by mouth 3 (three) times daily.   Yes Historical Provider, MD  insulin aspart (NOVOLOG) 100 UNIT/ML injection Inject 1-9 Units into the skin 3 (three) times daily before meals. Patient does sliding scale. Sugar reading less than 120, give 0 unit. Anything greater than 400, call MD   Yes Historical Provider, MD  insulin glargine (LANTUS) 100 UNIT/ML injection Inject 22 Units into the skin at bedtime.   Yes Historical Provider, MD  insulin lispro (HUMALOG KWIKPEN) 100 UNIT/ML KiwkPen Inject 7 Units into the skin 3 (three) times daily. Before meals   Yes Historical Provider, MD  Melatonin 3 MG CAPS Take 3 mg by mouth at bedtime.   Yes Historical Provider, MD  metFORMIN (GLUCOPHAGE) 500 MG tablet Take 500 mg by mouth 2 (two) times daily with a meal.   Yes Historical Provider, MD  nitroGLYCERIN (NITROSTAT) 0.4 MG SL tablet Place 0.4 mg under the tongue every 5 (five) minutes as needed for chest pain.   Yes Historical Provider, MD  omeprazole (PRILOSEC) 20 MG capsule Take 20 mg by mouth daily.   Yes Historical Provider, MD  ondansetron (ZOFRAN) 4 MG tablet Take 4 mg by mouth every 8 (eight) hours as needed for nausea or vomiting.   Yes Historical Provider, MD  potassium chloride (K-DUR,KLOR-CON) 10 MEQ tablet Take 10 mEq by mouth daily.    Yes Historical Provider, MD  simvastatin (ZOCOR) 5 MG tablet Take 5 mg by mouth daily.   Yes Historical Provider, MD  thiamine (VITAMIN B-1) 100 MG tablet Take 100 mg by mouth daily.   Yes Historical Provider, MD    Family  History History reviewed. No pertinent family history.  Social History Social History  Substance Use Topics  . Smoking status: Current Every Day Smoker    Types: Cigarettes  . Smokeless tobacco: Never Used  . Alcohol use Yes     Comment: duaghter states that someone is bringing him ETOH at the nursing home.      Allergies   Review of patient's allergies indicates not on file.   Review of Systems Review of Systems  Unable to perform ROS: Acuity of condition     Physical Exam Updated Vital Signs BP 140/70 (BP Location: Right Arm)   Pulse 98   Temp 99 F (37.2 C) (Oral)   Resp 18   Ht 5\' 9"  (1.753 m)   Wt 153 lb (69.4 kg)   SpO2 92%   BMI 22.59 kg/m   Physical Exam  Constitutional: He appears well-developed and well-nourished.  HENT:  Head: Normocephalic.  2 cm scalp laceration without active bleeding at this time small associated hematoma  Eyes: EOM are normal.  Neck:  Cervical collar in place.  No obvious C-spine step-offs or tenderness.  Cardiovascular: Normal rate, regular rhythm, normal heart sounds and intact distal pulses.   Pulmonary/Chest: Effort normal and breath sounds normal. No respiratory distress.  Abdominal: Soft. He exhibits no distension. There is no tenderness.  Musculoskeletal:  Full range of motion bilateral hips and knees.  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  Full range of motion of right shoulder and right elbow.  No thoracic or lumbar point tenderness  Neurological: He is alert.  Follow simple commands  Skin: Skin is warm and dry.  Psychiatric: He has a normal mood and affect. Judgment normal.  Nursing note and vitals reviewed.    ED Treatments / Results  Labs (all labs ordered are listed, but only abnormal results are displayed) Labs Reviewed  URINE CULTURE - Abnormal; Notable for the following:       Result Value   Culture MULTIPLE SPECIES PRESENT, SUGGEST RECOLLECTION (*)    All other components within normal  limits  CBC WITH DIFFERENTIAL/PLATELET - Abnormal; Notable for the following:    WBC 14.1 (*)    RBC 3.28 (*)    Hemoglobin 8.0 (*)    HCT 26.5 (*)    MCH 24.4 (*)    RDW 19.7 (*)    Neutro Abs 11.7 (*)    All other components within normal limits  COMPREHENSIVE METABOLIC PANEL - Abnormal; Notable for the following:    CO2 19 (*)    Glucose, Bld 296 (*)    BUN 47 (*)    Creatinine, Ser 3.66 (*)    Calcium 8.8 (*)    Total Protein 6.0 (*)  Albumin 2.8 (*)    GFR calc non Af Amer 15 (*)    GFR calc Af Amer 17 (*)    All other components within normal limits  URINALYSIS, ROUTINE W REFLEX MICROSCOPIC (NOT AT [facility]) - Abnormal; Notable for the following:    APPearance CLOUDY (*)    Specific Gravity, Urine 1.042 (*)    Hgb urine dipstick MODERATE (*)    Leukocytes, UA MODERATE (*)    All other components within normal limits  URINE RAPID DRUG SCREEN, HOSP PERFORMED - Abnormal; Notable for the following:    Opiates POSITIVE (*)    All other components within normal limits  URINE MICROSCOPIC-ADD ON - Abnormal; Notable for the following:    Squamous Epithelial / LPF 6-30 (*)    Bacteria, UA FEW (*)    Casts HYALINE CASTS (*)    Crystals CA OXALATE CRYSTALS (*)    All other components within normal limits  BASIC METABOLIC PANEL - Abnormal; Notable for the following:    Glucose, Bld 137 (*)    BUN 36 (*)    Creatinine, Ser 2.39 (*)    Calcium 8.1 (*)    GFR calc non Af Amer 25 (*)    GFR calc Af Amer 29 (*)    All other components within normal limits  CBC - Abnormal; Notable for the following:    WBC 10.7 (*)    RBC 2.68 (*)    Hemoglobin 6.4 (*)    HCT 21.1 (*)    MCH 23.5 (*)    MCHC 29.9 (*)    RDW 19.7 (*)    All other components within normal limits  GLUCOSE, CAPILLARY - Abnormal; Notable for the following:    Glucose-Capillary 154 (*)    All other components within normal limits  GLUCOSE, CAPILLARY - Abnormal; Notable for the following:    Glucose-Capillary 170  (*)    All other components within normal limits  GLUCOSE, CAPILLARY - Abnormal; Notable for the following:    Glucose-Capillary 169 (*)    All other components within normal limits  CBC - Abnormal; Notable for the following:    RBC 3.12 (*)    Hemoglobin 7.9 (*)    HCT 25.4 (*)    MCH 25.3 (*)    RDW 19.1 (*)    All other components within normal limits  GLUCOSE, CAPILLARY - Abnormal; Notable for the following:    Glucose-Capillary 172 (*)    All other components within normal limits  GLUCOSE, CAPILLARY - Abnormal; Notable for the following:    Glucose-Capillary 136 (*)    All other components within normal limits  GLUCOSE, CAPILLARY - Abnormal; Notable for the following:    Glucose-Capillary 145 (*)    All other components within normal limits  I-STAT CG4 LACTIC ACID, ED - Abnormal; Notable for the following:    Lactic Acid, Venous 3.02 (*)    All other components within normal limits  CULTURE, BLOOD (ROUTINE X 2)  CULTURE, BLOOD (ROUTINE X 2)  MRSA PCR SCREENING  URINE CULTURE  CK  LIPASE, BLOOD  ETHANOL  LACTIC ACID, PLASMA  TSH  TROPONIN I  TROPONIN I  TROPONIN I  LIPID PANEL  TROPONIN I  CBC  BASIC METABOLIC PANEL  TYPE AND SCREEN  PREPARE FRESH FROZEN PLASMA  ABO/RH  PREPARE RBC (CROSSMATCH)  BLOOD PRODUCT ORDER (VERBAL) VERIFICATION    EKG  EKG Interpretation None       Radiology Ct Head Wo Contrast  Result Date: 05/10/2016 CLINICAL DATA:  Fall with posterior scalp laceration and head injury. EXAM: CT HEAD WITHOUT CONTRAST CT CERVICAL SPINE WITHOUT CONTRAST TECHNIQUE: Multidetector CT imaging of the head and cervical spine was performed following the standard protocol without intravenous contrast. Multiplanar CT image reconstructions of the cervical spine were also generated. COMPARISON:  None. FINDINGS: CT HEAD FINDINGS The brain shows moderately advanced small vessel ischemic changes in the periventricular white matter. There is an old right-sided  posterior cerebral territory infarct involving the occipital lobe. Old lacunar infarct identified in the right thalamus. The brain demonstrates no evidence of hemorrhage, infarction, edema, mass effect, extra-axial fluid collection, hydrocephalus or mass lesion. Skin staples are present at the posterior vertex. No underlying skull fracture identified. No evidence of soft tissue foreign body. Mucosal thickening is present in left-sided frontal air cells. CT CERVICAL SPINE FINDINGS The cervical spine shows normal alignment. There is no evidence of acute fracture or subluxation. No soft tissue swelling or hematoma is identified. Advanced spondylosis noted throughout the cervical spine with near complete bony fusion at the C3-4 and C4-5 levels and severe disc space narrowing at the C5-6 and C6-7 levels. No bony or soft tissue lesions are seen. The visualized airway is normally patent. IMPRESSION: 1. Scalp laceration at the posterior vertex without evidence of skull fracture or underlying brain injury. Advanced small vessel ischemic disease present as well as old infarct of the right occipital lobe. 2. No evidence of acute cervical fracture. 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 Electronically Signed   By: Aletta Edouard M.D.   On: 05/10/2016 13:52   Ct Cervical Spine Wo Contrast  Result Date: 05/10/2016 CLINICAL DATA:  Fall with posterior scalp laceration and head injury. EXAM: CT HEAD WITHOUT CONTRAST CT CERVICAL SPINE WITHOUT CONTRAST TECHNIQUE: Multidetector CT imaging of the head and cervical spine was performed following the standard protocol without intravenous contrast. Multiplanar CT image reconstructions of the cervical spine were also generated. COMPARISON:  None. FINDINGS: CT HEAD FINDINGS The brain shows moderately advanced small vessel ischemic changes in the periventricular white matter. There is an old right-sided posterior cerebral territory infarct  involving the occipital lobe. Old lacunar infarct identified in the right thalamus. The brain demonstrates no evidence of hemorrhage, infarction, edema, mass effect, extra-axial fluid collection, hydrocephalus or mass lesion. Skin staples are present at the posterior vertex. No underlying skull fracture identified. No evidence of soft tissue foreign body. Mucosal thickening is present in left-sided frontal air cells. CT CERVICAL SPINE FINDINGS The cervical spine shows normal alignment. There is no evidence of acute fracture or subluxation. No soft tissue swelling or hematoma is identified. Advanced spondylosis noted throughout the cervical spine with near complete bony fusion at the C3-4 and C4-5 levels and severe disc space narrowing at the C5-6 and C6-7 levels. No bony or soft tissue lesions are seen. The visualized airway is normally patent. IMPRESSION: 1. Scalp laceration at the posterior vertex without evidence of skull fracture or underlying brain injury. Advanced small vessel ischemic disease present as well as old infarct of the right occipital lobe. 2. No evidence of acute cervical fracture. 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 Electronically Signed   By: Aletta Edouard M.D.   On: 05/10/2016 13:52   Ct Abdomen Pelvis W Contrast  Result Date: 05/10/2016 CLINICAL DATA:  Fall.  Hypotensive. EXAM: CT ABDOMEN AND PELVIS WITH CONTRAST TECHNIQUE: Multidetector CT imaging of the  abdomen and pelvis was performed using the standard protocol following bolus administration of intravenous contrast. CONTRAST:  154mL ISOVUE-300 IOPAMIDOL (ISOVUE-300) INJECTION 61% COMPARISON:  None. FINDINGS: Lower chest:  Bibasilar scarring present. Hepatobiliary: The liver appears normal. The gallbladder contains numerous calcified gallstones. There is no evidence of gallbladder inflammation or biliary ductal dilatation. Pancreas: The pancreas is atrophic and demonstrates extensive  parenchymal calcifications, consistent with chronic pancreatitis. Spleen: Within normal limits in size and appearance. Adrenals/Urinary Tract: Adrenal glands and kidneys are unremarkable bilaterally. The bladder has a normal appearance. Stomach/Bowel: There is a large hiatal hernia. Bowel shows no evidence of obstruction, inflammation or perforation. Diverticulosis of the descending and sigmoid colon present without evidence of diverticulitis. No free air or abnormal fluid collections. Vascular/Lymphatic: No pathologically enlarged lymph nodes. The abdominal aorta is diffusely calcified without evidence of of aneurysm. Other: No hernias identified. Musculoskeletal: No evidence of fractures. Moderate disc space narrowing is present at L5-S1. IMPRESSION: 1. No acute injuries identified in the abdomen or pelvis. 2. Cholelithiasis. 3. Evidence of calcified chronic pancreatitis. 4. Large hiatal hernia. 5. Aortic atherosclerosis without evidence of aneurysm. Electronically Signed   By: Aletta Edouard M.D.   On: 05/10/2016 13:56   Dg Pelvis Portable  Result Date: 05/10/2016 CLINICAL DATA:  Trauma. EXAM: PORTABLE PELVIS 1-2 VIEWS COMPARISON:  None. FINDINGS: There is no evidence of pelvic fracture or diastasis. No pelvic bone lesions are seen. Hips show normal alignment bilaterally without evidence of fracture or dislocation. IMPRESSION: Negative. Electronically Signed   By: Aletta Edouard M.D.   On: 05/10/2016 13:36   Dg Chest Portable 1 View  Result Date: 05/10/2016 CLINICAL DATA:  Fall. EXAM: PORTABLE CHEST 1 VIEW COMPARISON:  None. FINDINGS: The heart is mildly enlarged. There is no evidence of pulmonary edema, consolidation, pneumothorax, nodule or pleural fluid. The visualized skeletal structures are unremarkable. IMPRESSION: No acute findings in the chest.  The heart is mildly enlarged. Electronically Signed   By: Aletta Edouard M.D.   On: 05/10/2016 13:35    Procedures .Critical Care Performed by:  Jola Schmidt Authorized by: Jola Schmidt    CRITICAL CARE Performed by: Hoy Morn Total critical care time: 30 minutes Critical care time was exclusive of separately billable procedures and treating other patients. Critical care was necessary to treat or prevent imminent or life-threatening deterioration. Critical care was time spent personally by me on the following activities: development of treatment plan with patient and/or surrogate as well as nursing, discussions with consultants, evaluation of patient's response to treatment, examination of patient, obtaining history from patient or surrogate, ordering and performing treatments and interventions, ordering and review of laboratory studies, ordering and review of radiographic studies, pulse oximetry and re-evaluation of patient's condition.  ++++++++++++++++++++++++++++++++++++++++++++++++++++++++  Angiocath insertion Performed by: Hoy Morn Consent: Verbal consent obtained. Risks and benefits: risks, benefits and alternatives were discussed Time out: Immediately prior to procedure a "time out" was called to verify the correct patient, procedure, equipment, support staff and site/side marked as required. Preparation: Patient was prepped and draped in the usual sterile fashion. Vein Location: Left external jugular Gauge: 18 Normal blood return and flush without difficulty Patient tolerance: Patient tolerated the procedure well with no immediate complications.    LACERATION REPAIR Performed by: Hoy Morn Consent: Verbal consent obtained. Risks and benefits: risks, benefits and alternatives were discussed Patient identity confirmed: provided demographic data Time out performed prior to procedure Prepped and Draped in normal sterile fashion Wound explored Laceration Location: Scalp Laceration Length: 2 cm  No Foreign Bodies seen or palpated Anesthesia: None  Amount of cleaning: standard Skin closure: Staples    Number of sutures or staples: 3  Technique: Staples  Patient tolerance: Patient tolerated the procedure well with no immediate complications.   ++++++++++++++++++++++++++++++++++++++++++++++++++++++++++++  Medications Ordered in ED Medications  ARIPiprazole (ABILIFY) tablet 2 mg (2 mg Oral Given 05/11/16 0917)  cholecalciferol (VITAMIN D) tablet 2,000 Units (2,000 Units Oral Given 05/11/16 0917)  famotidine (PEPCID) tablet 20 mg (20 mg Oral Given [id number])  folic acid (FOLVITE) tablet 1 mg (1 mg Oral Given 05/11/16 0918)  gabapentin (NEURONTIN) capsule 300 mg (300 mg Oral Given 05/11/16 2204)  simvastatin (ZOCOR) tablet 5 mg (5 mg Oral Given 05/11/16 0918)  thiamine (VITAMIN B-1) tablet 100 mg (100 mg Oral Given 05/11/16 0917)  0.9 %  sodium chloride infusion ( Intravenous New Bag/Given 05/11/16 2304)  acetaminophen (TYLENOL) tablet 650 mg (not administered)    Or  acetaminophen (TYLENOL) suppository 650 mg (not administered)  insulin aspart (novoLOG) injection 0-9 Units (2 Units Subcutaneous Given 05/11/16 1751)  insulin aspart (novoLOG) injection 0-5 Units (0 Units Subcutaneous Not Given 05/11/16 2206)  vancomycin (VANCOCIN) IVPB 750 mg/150 ml premix (750 mg Intravenous Given 05/11/16 1231)  polyvinyl alcohol (LIQUIFILM TEARS) 1.4 % ophthalmic solution 2 drop (2 drops Both Eyes Not Given 05/11/16 1000)  piperacillin-tazobactam (ZOSYN) IVPB 3.375 g (3.375 g Intravenous Given 05/12/16 0008)  heparin injection 5,000 Units (5,000 Units Subcutaneous Given 05/11/16 2204)  feeding supplement (ENSURE ENLIVE) (ENSURE ENLIVE) liquid 237 mL (not administered)  aspirin EC tablet 81 mg (81 mg Oral Given 05/11/16 2204)  sodium chloride 0.9 % bolus 1,000 mL (0 mLs Intravenous Stopped 05/10/16 1505)  iopamidol (ISOVUE-300) 61 % injection (100 mLs  Contrast Given 05/10/16 1330)  vancomycin (VANCOCIN) IVPB 1000 mg/200 mL premix (0 mg Intravenous Stopped 05/10/16 1542)  piperacillin-tazobactam (ZOSYN) IVPB 3.375 g  (0 g Intravenous Stopped 05/10/16 1435)  sodium chloride 0.9 % bolus 1,500 mL (0 mLs Intravenous Stopped 05/10/16 1651)  sodium chloride 0.9 % bolus 500 mL (0 mLs Intravenous Stopped 05/10/16 1812)  0.9 %  sodium chloride infusion ( Intravenous Duplicate [id number])     Initial Impression / Assessment and Plan / ED Course  I have reviewed the triage vital signs and the nursing notes.  Pertinent labs & imaging results that were available during my care of the patient were reviewed by me and considered in my medical decision making (see chart for details).  Clinical Course    Patient presented as level I trauma given the question of hemorrhagic shock.  After further information is obtained sounds like the amount of bleeding at the scene was not significant enough to cause his hypotension.  He was given IV fluids and after arrival to the emergency department and IV fluid resuscitation began his blood pressure came up.  He does appear to be in acute renal failure.  Question of sepsis given the severe hypothermia of 93patient given broad-spectrum antibiotics.  Blood and urine cultures.  Family medicine to admit..  Final Clinical Impressions(s) / ED Diagnoses   Final diagnoses:  AKI (acute kidney injury) ([place])  Scalp laceration, initial encounter  Anemia, unspecified anemia type  Sepsis, due to unspecified organism ([place])  Hypothermia, initial encounter    New Prescriptions Current Discharge Medication List       Jola Schmidt, MD 05/12/16 [phone]

## 2016-05-12 NOTE — Evaluation (Signed)
Physical Therapy Evaluation Patient Details Name: [name]. MRN: [id number] DOB: 16-Nov-1940 Today's Date: 05/12/2016   History of Present Illness  Patient is a 75 yo male admitted 05/10/16 after being found down following unwitnessed fall.  Patient with scalp laceration, hypotension, anemia, sepsis, AKI, tachycardia.   PMH:  HTN, DM, CAD, CVA with LUE contractures, COPD, ETOH/tobacco use, depression    Clinical Impression  Patient presents with problems listed below.  Will benefit from acute PT to maximize functional mobility prior to discharge.  Patient with decreased deficit awareness/safety awareness.  Patient with LE's off each side of recliner with foot of recliner raised, stating "this bell goes off every time I try to get up to walk".  Patient lost balance during gait with PT x2 requiring assist to prevent fall.  Feel patient at high fall risk.  Recommend SNF at d/c for continued therapy for mobility, gait, safety.    Follow Up Recommendations SNF;Supervision/Assistance - 24 hour    Equipment Recommendations  None recommended by PT    Recommendations for Other Services       Precautions / Restrictions Precautions Precautions: Fall Restrictions Weight Bearing Restrictions: No      Mobility  Bed Mobility               General bed mobility comments: Patient in chair  Transfers Overall transfer level: Needs assistance Equipment used: 1 person hand held assist Transfers: Sit to/from Stand Sit to Stand: Min assist         General transfer comment: Verbal cues to wait until PT has all lines prepared before standing.  Assist to steady during transfers.  Ambulation/Gait Ambulation/Gait assistance: Min assist;Mod assist Ambulation Distance (Feet): 30 Feet Assistive device: 1 person hand held assist Gait Pattern/deviations: Step-through pattern;Decreased step length - right;Decreased step length - left;Decreased stride length;Shuffle;Staggering left;Staggering  right Gait velocity: decreased Gait velocity interpretation: Below normal speed for age/gender General Gait Details: Patient with slow, unsteady gait.  Lost balance x2 requiring assist to prevent fall.  Patient ran into [facility] on his left during gait, and required several seconds to locate the door to return to room.   Question vision issues as well.  Stairs            Wheelchair Mobility    Modified Rankin (Stroke Patients Only)       Balance Overall balance assessment: Needs assistance;History of Falls         Standing balance support: Single extremity supported Standing balance-Leahy Scale: Poor Standing balance comment: Loss of balance during gait                             Pertinent Vitals/Pain Pain Assessment: No/denies pain    Home Living Family/patient expects to be discharged to:: Assisted living               Home Equipment: Walker - 2 wheels;Cane - single point;Shower seat      Prior Function Level of Independence: Independent with assistive device(s);Needs assistance   Gait / Transfers Assistance Needed: Patient reports he uses a cane and a walker (unsure how uses RW with LUE function.  ADL's / Homemaking Assistance Needed: Reports he completes his own bathing and dressing.  Facility provides meals.  Comments: Patient with difficulty providing information - unsure of accuracy.  Reports he has had no falls.     Hand Dominance   Dominant Hand: Right    Extremity/Trunk Assessment  Upper Extremity Assessment: LUE deficits/detail;Defer to OT evaluation       LUE Deficits / Details: Deficits from prior CVA   Lower Extremity Assessment: Generalized weakness         Communication   Communication: No difficulties  Cognition Arousal/Alertness: Awake/alert Behavior During Therapy: Impulsive;Restless Overall Cognitive Status: No family/caregiver present to determine baseline cognitive functioning Area of Impairment:  Memory;Safety/judgement;Problem solving;Awareness     Memory: Decreased short-term memory   Safety/Judgement: Decreased awareness of deficits;Decreased awareness of safety   Problem Solving: Slow processing;Difficulty sequencing;Requires verbal cues General Comments: Patient with decreased safety awareness and impulsive.    General Comments      Exercises        Assessment/Plan    PT Assessment Patient needs continued PT services  PT Diagnosis Difficulty walking;Abnormality of gait;Generalized weakness;Altered mental status   PT Problem List Decreased strength;Decreased balance;Decreased mobility;Decreased cognition;Decreased knowledge of use of DME;Decreased safety awareness  PT Treatment Interventions DME instruction;Gait training;Functional mobility training;Therapeutic activities;Balance training;Cognitive remediation;Patient/family education   PT Goals (Current goals can be found in the Care Plan section) Acute Rehab PT Goals Patient Stated Goal: To walk around my room PT Goal Formulation: With patient Time For Goal Achievement: 05/19/16 Potential to Achieve Goals: Good    Frequency Min 3X/week   Barriers to discharge        Co-evaluation               End of Session Equipment Utilized During Treatment: Gait belt Activity Tolerance: Patient tolerated treatment well Patient left: in chair;with call bell/phone within reach;with chair alarm set Nurse Communication: Mobility status (Safety awareness)         Time: [id number] PT Time Calculation (min) (ACUTE ONLY): 16 min   Charges:   PT Evaluation $PT Eval Moderate Complexity: 1 Procedure     PT G CodesDespina Pole May 29, 2016, 5:30 PM Carita Pian. Sanjuana Kava, [place] Pager [phone]

## 2016-05-13 DIAGNOSIS — E162 Hypoglycemia, unspecified: Secondary | ICD-10-CM

## 2016-05-13 LAB — GLUCOSE, CAPILLARY
Glucose-Capillary: 157 mg/dL — ABNORMAL HIGH (ref 65–99)
Glucose-Capillary: 196 mg/dL — ABNORMAL HIGH (ref 65–99)
Glucose-Capillary: 212 mg/dL — ABNORMAL HIGH (ref 65–99)

## 2016-05-13 LAB — BASIC METABOLIC PANEL
ANION GAP: 12 (ref 5–15)
BUN: 11 mg/dL (ref 6–20)
CHLORIDE: 105 mmol/L (ref 101–111)
CO2: 20 mmol/L — AB (ref 22–32)
Calcium: 9 mg/dL (ref 8.9–10.3)
Creatinine, Ser: 1.03 mg/dL (ref 0.61–1.24)
GFR calc non Af Amer: >60 mL/min (ref >60)
GLUCOSE: 171 mg/dL — AB (ref 65–99)
Potassium: 3.6 mmol/L (ref 3.5–5.1)
Sodium: 137 mmol/L (ref 135–145)

## 2016-05-13 LAB — CBC
HEMATOCRIT: 25.3 % — AB (ref 39.0–52.0)
HEMOGLOBIN: 8.1 g/dL — AB (ref 13.0–17.0)
MCH: 25.6 pg — AB (ref 26.0–34.0)
MCHC: 32 g/dL (ref 30.0–36.0)
MCV: 79.8 fL (ref 78.0–100.0)
Platelets: 254 10*3/uL (ref 150–400)
RBC: 3.17 MIL/uL — AB (ref 4.22–5.81)
RDW: 18.8 % — ABNORMAL HIGH (ref 11.5–15.5)
WBC: 15 10*3/uL — ABNORMAL HIGH (ref 4.0–10.5)

## 2016-05-13 MED ORDER — AMLODIPINE BESYLATE 2.5 MG PO TABS
2.5000 mg | ORAL_TABLET | Freq: Every day | ORAL | Status: DC
  Administered 2016-05-13 – 2016-05-15 (×3): 2.5 mg via ORAL
  Filled 2016-05-13 (×4): qty 1

## 2016-05-13 NOTE — Progress Notes (Signed)
Family Medicine Teaching Service Daily Progress Note Intern Pager: [phone]  Patient name: [name]. Medical record number: [id number] Date of birth: 19-Aug-1941 Age: 75 y.o. Gender: male  Primary Care Provider: No PCP Per Patient Consultants: none Code Status: FULL  Pt Overview and Major Events to Date:  8/9 Admitted to SDU for sepsis of unknown source  Assessment and Plan: Gillian Halpert. is a 75 y.o. male presenting after fall . PMH is significant for HTN, DM2, CAD, CVA, COPD, ETOH use, tobacco disorder, protein calorie malnutrition, dementia  Sepsis: Unclear etiology, questionable UTI. Blood cultures negative, urine cultures with insignificant growth. Recently treated for UTI with Keflex as outpatient. Recent admission with blood cultures growing Micrococcus species in 1/2 bottles, likely contaminant. CXR on admission was clear, but was only a 1 view. WBC increased from 9.4 > 15, but Pt has been clinically improving and has been afebrile. - s/p Vanc/Zosyn, now on Levaquin. Will continue for 1 more day. - Monitor WBC - Transfer to floor  Tachycardia in setting of h/o CAD, improving: Likely 2/2 anemia. ACS unlikely with normal EKG and troponins neg x 3. Pt also denying chest pain. - monitor on tele  Normocytic Anemia, asymptomatic: Improving. baseline Hgb 9-11. S/p 1 unit pRBC 8/10 after Hgb 6.4 (2/2 acute blood loss in the setting of scalp laceration, also with likely hemodilution). Hgb improved from 7.3 > 8.1 this morning. - monitor CBC - transfuse <8 - continue ASA 81 and heparin  Fall with scalp laceration: Likely mechanical vs presyncope in the setting of dehydration. CT head negative. - Telemetry - PT/OT recommending SNF - Fall precautions - Will need staples removed in 7-10 days (on 05/17/16-05/19/16)  AKI, resolved: Pre-renal in the setting of dehydration. Cr 3.66 on admit, improved to 1.2 with baseline 0.6-0.9.  - Avoid nephrotoxic agents - monitor BMP    DM2: Recent admission for hypoglycemia but blood glucose has been within normal limits during this hospital stay. Last a1c 6.8 on 05/05/16. Likely do not need such tight blood sugar control in this 74 year old. Goal A1c <8. Low blood sugar to 57 yesterday evening. Only eating ~10% of meals. - SSI sensitive, ACHS - Neurontin 300 TID  - Need to decrease Lantus and Novolog doses on discharge.  Hypotension (resolved) with history of HTN: Hypotension resolved with IVFs. BP 155/80 this morning.  - Hold home Norvasc. Can add back if BPs remain elevated today. - monitor BP  h/o CVA. On Zocor 5. 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 Stable. - Continue statin - Continue home baclofen    COPD, not in exacerbation. Stable on room air. - Monitor - O2 prn    Protein calorie malnutrition  - Consulted nutrition for assessment at last visit  Depression - continue home abilify  FEN/GI: heart/carb modified, pepcid Prophylaxis: SCDs  Disposition: PT/OT recommending SNF. SW consulted for placement.  Subjective:  Pt did well overnight. Has no concerns this morning. Denies chest pain, abdominal pain, or shortness of breath.  Objective: Temp:  [97.8 F (36.6 C)-99.2 F (37.3 C)] 98.5 F (36.9 C) (08/12 0400) Resp:  [16-26] 20 (08/12 0400) BP: (126-159)/(72-98) 155/80 (08/12 0400) SpO2:  [92 %-100 %] 92 % (08/12 0400) Physical Exam: General: sitting up in bed, in NAD Cardiovascular: tachycardic, regular rhythm, no murmurs appreciated Respiratory: CTAB, normal effort Abdomen: soft, non tender, +BS Extremities: left hand contracture Neuro: alert, no focal deficits besides limited movement of left arm Skin: laceration on occiput with staples intact  Laboratory:  Recent Labs Lab 05/11/16 1210 05/12/16 0858 05/13/16 0315  WBC 10.4 9.4 15.0*  HGB 7.9* 7.3* 8.1*  HCT 25.4* 23.5* 25.3*  PLT 238 235 254    Recent Labs Lab 05/10/16 1259 05/11/16 0345 05/12/16 0858 05/13/16 0315  NA 135  139 137 137  K 4.8 3.7 3.3* 3.6  CL 105 111 108 105  CO2 19* 22 23 20*  BUN 47* 36* 14 11  CREATININE 3.66* 2.39* 1.20 1.03  CALCIUM 8.8* 8.1* 8.4* 9.0  PROT 6.0*  --   --   --   BILITOT 0.5  --   --   --   ALKPHOS 64  --   --   --   ALT 20  --   --   --   AST 31  --   --   --   GLUCOSE 296* 137* 195* 171*   Urinalysis    Component Value Date/Time   COLORURINE YELLOW 05/10/2016 1741   APPEARANCEUR CLOUDY (A) 05/10/2016 1741   LABSPEC 1.042 (H) 05/10/2016 1741   PHURINE 5.0 05/10/2016 1741   GLUCOSEU NEGATIVE 05/10/2016 1741   HGBUR MODERATE (A) 05/10/2016 1741   BILIRUBINUR NEGATIVE 05/10/2016 1741   KETONESUR NEGATIVE 05/10/2016 1741   PROTEINUR NEGATIVE 05/10/2016 1741   NITRITE NEGATIVE 05/10/2016 1741   LEUKOCYTESUR MODERATE (A) 05/10/2016 1741     Imaging/Diagnostic Tests: No results found.  Sela Hua, MD 05/13/2016, 7:28 AM PGY-2, [place] Intern pager: [phone], text pages welcome

## 2016-05-14 DIAGNOSIS — E440 Moderate protein-calorie malnutrition: Secondary | ICD-10-CM | POA: Insufficient documentation

## 2016-05-14 LAB — CBC
HEMATOCRIT: 24 % — AB (ref 39.0–52.0)
Hemoglobin: 7.5 g/dL — ABNORMAL LOW (ref 13.0–17.0)
MCH: 25.6 pg — AB (ref 26.0–34.0)
MCHC: 31.3 g/dL (ref 30.0–36.0)
MCV: 81.9 fL (ref 78.0–100.0)
Platelets: 257 10*3/uL (ref 150–400)
RBC: 2.93 MIL/uL — ABNORMAL LOW (ref 4.22–5.81)
RDW: 19.6 % — AB (ref 11.5–15.5)
WBC: 12.2 10*3/uL — ABNORMAL HIGH (ref 4.0–10.5)

## 2016-05-14 LAB — GLUCOSE, CAPILLARY
GLUCOSE-CAPILLARY: 291 mg/dL — AB (ref 65–99)
Glucose-Capillary: 163 mg/dL — ABNORMAL HIGH (ref 65–99)
Glucose-Capillary: 256 mg/dL — ABNORMAL HIGH (ref 65–99)

## 2016-05-14 LAB — BASIC METABOLIC PANEL
Anion gap: 10 (ref 5–15)
BUN: 9 mg/dL (ref 6–20)
CALCIUM: 8.7 mg/dL — AB (ref 8.9–10.3)
CO2: 25 mmol/L (ref 22–32)
CREATININE: 1 mg/dL (ref 0.61–1.24)
Chloride: 103 mmol/L (ref 101–111)
GFR calc Af Amer: >60 mL/min (ref >60)
GFR calc non Af Amer: >60 mL/min (ref >60)
GLUCOSE: 146 mg/dL — AB (ref 65–99)
Potassium: 3.3 mmol/L — ABNORMAL LOW (ref 3.5–5.1)
Sodium: 138 mmol/L (ref 135–145)

## 2016-05-14 LAB — PREPARE RBC (CROSSMATCH)

## 2016-05-14 MED ORDER — ONDANSETRON HCL 4 MG PO TABS: 4.0000 mg | ORAL_TABLET | Freq: Three times a day (TID) | ORAL | Status: DC | PRN

## 2016-05-14 MED ORDER — SODIUM CHLORIDE 0.9 % IV SOLN: Freq: Once | INTRAVENOUS | Status: DC

## 2016-05-14 MED ORDER — AMLODIPINE BESYLATE 2.5 MG PO TABS: 2.5000 mg | ORAL_TABLET | Freq: Every day | ORAL | Status: DC

## 2016-05-14 MED ORDER — ONDANSETRON HCL 4 MG/2ML IJ SOLN
4.0000 mg | Freq: Three times a day (TID) | INTRAMUSCULAR | Status: DC | PRN
  Administered 2016-05-15: 4 mg via INTRAVENOUS
  Filled 2016-05-14 (×2): qty 2

## 2016-05-14 NOTE — Progress Notes (Signed)
Family Medicine Teaching Service Daily Progress Note Intern Pager: [phone]  Patient name: [name]. Medical record number: [id number] Date of birth: 12-29-1940 Age: 75 y.o. Gender: male  Primary Care Provider: No PCP Per Patient Consultants: none Code Status: FULL  Pt Overview and Major Events to Date:  8/9 Admitted to SDU for sepsis of unknown source  Assessment and Plan: Tjay Cooperwood. is a 75 y.o. male presenting after fall . PMH is significant for HTN, DM2, CAD, CVA, COPD, ETOH use, tobacco disorder, protein calorie malnutrition, dementia  Sepsis: Vital signs now stable.  Unclear etiology, questionable UTI. Blood cultures negative, urine cultures with insignificant growth. Recently treated for UTI with Keflex as outpatient. Recent admission with blood cultures growing Micrococcus species in 1/2 bottles, likely contaminant. CXR on admission was clear, but was only a 1 view. WBC increased from 9.4 > 15, but Pt has been clinically improving and has been afebrile. WBC now down-trending to 12.2.  - s/p Vanc/Zosyn, now on Levaquin. Will complete course of Levaquin today, 8/13.  - Monitor WBC   Tachycardia in setting of h/o CAD, improving: Likely 2/2 anemia. ACS unlikely with normal EKG and troponins neg x 3. Pt also denying chest pain. - monitor on tele  Normocytic Anemia, asymptomatic: Improving. baseline Hgb 9-11. S/p 1 unit pRBC 8/10 after Hgb 6.4 (2/2 acute blood loss in the setting of scalp laceration, also with likely hemodilution). Hgb 7.5 this morning.  - monitor CBC - transfuse <8, will give 1u PRBC this AM  - continue ASA 81 and heparin  Fall with scalp laceration: Likely mechanical vs presyncope in the setting of dehydration. CT head negative. - Telemetry - PT/OT recommending SNF - Fall precautions - Will need staples removed in 7-10 days (on 05/17/16-05/19/16)  AKI, resolved: Pre-renal in the setting of dehydration. Cr 3.66 on admit, improved to 1.0 with  baseline 0.6-0.9.  - Avoid nephrotoxic agents - monitor BMP    DM2: Recent admission for hypoglycemia but blood glucose has been within normal limits during this hospital stay. Last a1c 6.8 on 05/05/16. Likely do not need such tight blood sugar control in this 75 year old. Goal A1c <8.  - SSI sensitive, ACHS - Neurontin 300 TID  - at discharge would d/c insulin regimen and continue home metformin given well controlled A1C   Hypotension (resolved) with history of HTN: Hypotension resolved with IVFs. BP 137/68 this AM.   - Hold home Norvasc. Can add back if BPs remain elevated today. - monitor BP  h/o CVA. On Zocor 5. 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 Stable. - Continue statin - Continue home baclofen    COPD, not in exacerbation. Stable on room air. - Monitor - O2 prn    Protein calorie malnutrition  - Consulted nutrition for assessment at last visit  Depression - continue home abilify  FEN/GI: heart/carb modified, pepcid Prophylaxis: SCDs  Disposition: PT/OT recommending SNF. SW consulted for placement.  Subjective:  Pt did well overnight. Has no concerns this morning. Denies chest pain, abdominal pain, or shortness of breath.  Objective: Temp:  [97 F (36.1 C)-98.9 F (37.2 C)] 98.9 F (37.2 C) (08/12 2116) Pulse Rate:  [90-94] 94 (08/12 2116) Resp:  [14-20] 18 (08/12 2116) BP: (135-141)/(67-82) 137/68 (08/12 2116) SpO2:  [94 %-99 %] 99 % (08/12 2116) Weight:  [153 lb 10.6 oz (69.7 kg)] 153 lb 10.6 oz (69.7 kg) (08/13 0358) Physical Exam: General: sitting up in bed, in NAD Cardiovascular: tachycardic, regular rhythm, no  murmurs appreciated Respiratory: CTAB, normal effort Abdomen: soft, non tender, +BS Extremities: left hand contracture Neuro: alert, no focal deficits besides limited movement of left arm Skin: laceration on occiput with staples intact  Laboratory:  Recent Labs Lab 05/12/16 0858 05/13/16 0315 05/14/16 0452  WBC 9.4 15.0* 12.2*  HGB  7.3* 8.1* 7.5*  HCT 23.5* 25.3* 24.0*  PLT 235 254 257    Recent Labs Lab 05/10/16 1259  05/12/16 0858 05/13/16 0315 05/14/16 0452  NA 135  < > 137 137 138  K 4.8  < > 3.3* 3.6 3.3*  CL 105  < > 108 105 103  CO2 19*  < > 23 20* 25  BUN 47*  < > 14 11 9   CREATININE 3.66*  < > 1.20 1.03 1.00  CALCIUM 8.8*  < > 8.4* 9.0 8.7*  PROT 6.0*  --   --   --   --   BILITOT 0.5  --   --   --   --   ALKPHOS 64  --   --   --   --   ALT 20  --   --   --   --   AST 31  --   --   --   --   GLUCOSE 296*  < > 195* 171* 146*  < > = values in this interval not displayed. Urinalysis    Component Value Date/Time   COLORURINE YELLOW 05/10/2016 1741   APPEARANCEUR CLOUDY (A) 05/10/2016 1741   LABSPEC 1.042 (H) 05/10/2016 1741   PHURINE 5.0 05/10/2016 1741   GLUCOSEU NEGATIVE 05/10/2016 1741   HGBUR MODERATE (A) 05/10/2016 1741   BILIRUBINUR NEGATIVE 05/10/2016 1741   KETONESUR NEGATIVE 05/10/2016 1741   PROTEINUR NEGATIVE 05/10/2016 1741   NITRITE NEGATIVE 05/10/2016 1741   LEUKOCYTESUR MODERATE (A) 05/10/2016 1741     Imaging/Diagnostic Tests: No results found.  Nicolette Bang, DO 05/14/2016, 8:12 AM PGY-2, [place] Intern pager: [phone], text pages welcome

## 2016-05-14 NOTE — Progress Notes (Signed)
MD notified due to acute change in patient status. Vital signs and labs are listed below. MEWS fired, possible because of HR elevated & temp. 99.0.  Tele call and stated HR was up in 140-150 sustained, down to 105 now at time of page.  MD notified(1st page) Time of 1st page:  2000 Responding MD:  Dr. Juleen China Time MD responded: 2012 MD response: when given information of pt. N/V, MD placed new orders.  Vital Signs Vitals:   05/14/16 1001 05/14/16 1440 05/14/16 1839 05/14/16 1948  BP: 136/79 (!) 146/70 (!) 155/74 (!) 140/91  Pulse: 99 89 93 (!) 114  Resp:  20 (!) 22 18  Temp:  98.3 F (36.8 C) 99 F (37.2 C) 98.9 F (37.2 C)  TempSrc:  Oral Oral Oral  SpO2:  98% 96% 97%  Weight:      Height:         Lab Results WBC  Date/Time Value Ref Range Status  05/14/2016 04:52 AM 12.2 (H) 4.0 - 10.5 K/uL Final  05/13/2016 03:15 AM 15.0 (H) 4.0 - 10.5 K/uL Final  05/12/2016 08:58 AM 9.4 4.0 - 10.5 K/uL Final   Neutrophils Relative %  Date/Time Value Ref Range Status  05/10/2016 12:59 PM 83 % Final   No results found for: PCO2ART Lactic Acid, Venous  Date/Time Value Ref Range Status  05/10/2016 07:04 PM 1.5 0.5 - 1.9 mmol/L Final  05/10/2016 01:11 PM 3.02 (HH) 0.5 - 1.9 mmol/L Final   No results found for: Ralene Muskrat, Babette Relic, RN 05/14/2016, 8:01 PM

## 2016-05-14 NOTE — Evaluation (Signed)
Occupational Therapy Evaluation Patient Details Name: [name]. MRN: [id number] DOB: [birth year] Today's Date: 05/14/2016    History of Present Illness Patient is a 75 yo male admitted 05/10/16 after being found down following unwitnessed fall.  Patient with scalp laceration, hypotension, anemia, sepsis, AKI, tachycardia.   PMH:  HTN, DM, CAD, CVA with LUE contractures, COPD, ETOH/tobacco use, depression   Clinical Impression   Pt reports having been independent in self care and ambulating with a cane at his ALF. Pt presents with baseline L hemiplegia, impaired cognition, decreased balance and generalized weakness interfering with ability to mobilize and perform ADL at his baseline. Will follow acutely.     Follow Up Recommendations  SNF;Supervision/Assistance - 24 hour    Equipment Recommendations       Recommendations for Other Services       Precautions / Restrictions Precautions Precautions: Fall Restrictions Weight Bearing Restrictions: No      Mobility Bed Mobility Overal bed mobility: Needs Assistance Bed Mobility: Supine to Sit     Supine to sit: Supervision     General bed mobility comments: HOB up, use of rail, increased time  Transfers Overall transfer level: Needs assistance Equipment used: 1 person hand held assist Transfers: Sit to/from [facility] Sit to Stand: Min assist Stand pivot transfers: Min assist       General transfer comment: min assist to rise and for balance    Balance Overall balance assessment: Needs assistance   Sitting balance-Leahy Scale: Fair       Standing balance-Leahy Scale: Poor                              ADL Overall ADL's : Needs assistance/impaired Eating/Feeding: Set up;Sitting Eating/Feeding Details (indicate cue type and reason): assist to open packages Grooming: Wash/dry hands;Wash/dry face;Bed level;Set up   Upper Body Bathing: Minimal assitance;Sitting   Lower Body  Bathing: Maximal assistance;Sit to/from stand   Upper Body Dressing : Minimal assistance;Sitting   Lower Body Dressing: Maximal assistance;Sit to/from stand   Toilet Transfer: Minimal assistance;Stand-pivot;BSC   Toileting- Clothing Manipulation and Hygiene: Maximal assistance;Sit to/from stand               Vision Additional Comments: appears to have L field cut, likely baseline   Perception     Praxis      Pertinent Vitals/Pain Pain Assessment: Faces Faces Pain Scale: Hurts little more Pain Location: buttocks Pain Descriptors / Indicators: Sore Pain Intervention(s): Repositioned;Monitored during session     Hand Dominance Right   Extremity/Trunk Assessment Upper Extremity Assessment Upper Extremity Assessment: LUE deficits/detail LUE Deficits / Details: Deficits/contractures from prior CVA LUE Coordination: decreased fine motor;decreased gross motor   Lower Extremity Assessment Lower Extremity Assessment: Defer to PT evaluation       Communication Communication Communication: No difficulties   Cognition Arousal/Alertness: Awake/alert Behavior During Therapy: Flat affect Overall Cognitive Status: No family/caregiver present to determine baseline cognitive functioning Area of Impairment: Memory     Memory: Decreased short-term memory             General Comments       Exercises       Shoulder Instructions      Home Living Family/patient expects to be discharged to:: Assisted living                             Home Equipment:  Walker - 2 wheels;Cane - single point;Shower seat          Prior Functioning/Environment Level of Independence: Independent with assistive device(s);Needs assistance  Gait / Transfers Assistance Needed: Patient reports he uses a cane and a walker (unsure how uses RW with LUE function. ADL's / Homemaking Assistance Needed: Reports he completes his own bathing and dressing.  Facility provides meals.    Comments: pt with hx of dementia, unsure if he is a reliable historian    OT Diagnosis: Generalized weakness;Cognitive deficits;Acute pain;Disturbance of vision;Hemiplegia non-dominant side   OT Problem List: Decreased strength;Decreased range of motion;Decreased activity tolerance;Impaired balance (sitting and/or standing);Decreased coordination;Decreased cognition;Impaired vision/perception;Decreased safety awareness;Impaired UE functional use;Pain;Impaired tone   OT Treatment/Interventions: Self-care/ADL training;DME and/or AE instruction;Visual/perceptual remediation/compensation;Patient/family education;Balance training;Therapeutic activities    OT Goals(Current goals can be found in the care plan section) Acute Rehab OT Goals Patient Stated Goal: to get off his bottom OT Goal Formulation: With patient Time For Goal Achievement: 05/28/16 Potential to Achieve Goals: Good ADL Goals Pt Will Perform Grooming: with min guard assist;standing Pt Will Perform Upper Body Dressing: with supervision;with set-up;sitting Pt Will Perform Lower Body Dressing: with min assist;sit to/from stand Pt Will Transfer to Toilet: with min guard assist;ambulating;bedside commode (over toilet) Pt Will Perform Toileting - Clothing Manipulation and hygiene: with min guard assist;sit to/from stand  OT Frequency: Min 2X/week   Barriers to D/C:            Co-evaluation              End of Session Equipment Utilized During Treatment: Gait belt Nurse Communication: Mobility status  Activity Tolerance: Patient tolerated treatment well Patient left: in chair;with call bell/phone within reach;with chair alarm set;with nursing/sitter in room   Time: [id number] OT Time Calculation (min): 24 min Charges:  OT General Charges $OT Visit: 1 Procedure OT Evaluation $OT Eval Moderate Complexity: 1 Procedure OT Treatments $Self Care/Home Management : 8-22 mins G-Codes:    Malka So 05/14/2016,  10:18 AM  [phone]

## 2016-05-15 LAB — TYPE AND SCREEN
ABO/RH(D): O POS
Antibody Screen: NEGATIVE
Unit division: 0

## 2016-05-15 LAB — GLUCOSE, CAPILLARY
GLUCOSE-CAPILLARY: 154 mg/dL — AB (ref 65–99)
GLUCOSE-CAPILLARY: 206 mg/dL — AB (ref 65–99)

## 2016-05-15 LAB — CULTURE, BLOOD (ROUTINE X 2)
CULTURE: NO GROWTH
Culture: NO GROWTH

## 2016-05-15 LAB — HEMOGLOBIN AND HEMATOCRIT, BLOOD
HCT: 28.4 % — ABNORMAL LOW (ref 39.0–52.0)
HEMOGLOBIN: 9.1 g/dL — AB (ref 13.0–17.0)

## 2016-05-15 MED ORDER — ROSUVASTATIN CALCIUM 20 MG PO TABS
20.0000 mg | ORAL_TABLET | Freq: Every day | ORAL | Status: DC
  Administered 2016-05-15: 20 mg via ORAL
  Filled 2016-05-15: qty 1

## 2016-05-15 MED ORDER — LOPERAMIDE HCL 2 MG PO CAPS: 2.0000 mg | ORAL_CAPSULE | ORAL | Status: DC | PRN

## 2016-05-15 MED ORDER — ROSUVASTATIN CALCIUM 20 MG PO TABS: 20.0000 mg | ORAL_TABLET | Freq: Every day | ORAL | 0 refills | Status: DC

## 2016-05-15 NOTE — Progress Notes (Signed)
NURSING PROGRESS NOTE  Euel Townsend [id number] Discharge Data: 05/15/2016 4:45 PM Attending Provider: No att. providers found PCP:No PCP Per Patient     [name] to be D/C'd Skilled nursing facility, [facility],  per MD order.  Discussed with the patient the After Visit Summary and all questions fully answered. All IV's discontinued with no bleeding noted. All belongings returned to patient for patient to take home.   Last Vital Signs:  Blood pressure 127/74, pulse 93, temperature 98.1 F (36.7 C), temperature source Oral, resp. rate 17, height 5\' 9"  (1.753 m), weight 69.7 kg (153 lb 10.6 oz), SpO2 100 %.  Discharge Medication List   Medication List    STOP taking these medications   furosemide 40 MG tablet Commonly known as:  LASIX   HUMALOG KWIKPEN 100 UNIT/ML KiwkPen Generic drug:  insulin lispro   insulin aspart 100 UNIT/ML injection Commonly known as:  novoLOG   insulin glargine 100 UNIT/ML injection Commonly known as:  LANTUS   omeprazole 20 MG capsule Commonly known as:  PRILOSEC   simvastatin 5 MG tablet Commonly known as:  ZOCOR     TAKE these medications   amLODipine 2.5 MG tablet Commonly known as:  NORVASC Take 2.5 mg by mouth daily.   ARIPiprazole 2 MG tablet Commonly known as:  ABILIFY Take 2 mg by mouth daily.   ARTIFICIAL TEARS 0.1-0.3 % Soln Apply 2 drops to eye daily.   aspirin 81 MG tablet Take 81 mg by mouth daily.   baclofen 10 MG tablet Commonly known as:  LIORESAL Take 10 mg by mouth 2 (two) times daily.   famotidine 20 MG tablet Commonly known as:  PEPCID Take 20 mg by mouth 2 (two) times daily.   folic acid 1 MG tablet Commonly known as:  FOLVITE Take 1 mg by mouth daily.   gabapentin 300 MG capsule Commonly known as:  NEURONTIN Take 300 mg by mouth 3 (three) times daily.   Melatonin 3 MG Caps Take 3 mg by mouth at bedtime.   metFORMIN 500 MG tablet Commonly known as:  GLUCOPHAGE Take 500 mg by mouth 2 (two)  times daily with a meal.   nitroGLYCERIN 0.4 MG SL tablet Commonly known as:  NITROSTAT Place 0.4 mg under the tongue every 5 (five) minutes as needed for chest pain.   ondansetron 4 MG tablet Commonly known as:  ZOFRAN Take 4 mg by mouth every 8 (eight) hours as needed for nausea or vomiting.   potassium chloride 10 MEQ tablet Commonly known as:  K-DUR,KLOR-CON Take 10 mEq by mouth daily.   rosuvastatin 20 MG tablet Commonly known as:  CRESTOR Take 1 tablet (20 mg total) by mouth daily.   thiamine 100 MG tablet Commonly known as:  VITAMIN B-1 Take 100 mg by mouth daily.   Vitamin D 2000 units tablet Take 2,000 Units by mouth daily.       Report given to [facility], nurse receiving pt.

## 2016-05-15 NOTE — Progress Notes (Signed)
Texted Family medicine in reference to patient throwing pvcs and having multifocal pvcs. Has been tachy also. Right now, patient is sleeping comfortably with a HR of 101.

## 2016-05-15 NOTE — Progress Notes (Signed)
CSW consulted for SNF placement- patient and patient dtr in agreement for SNF and prefers [facility] rehab- plan is to return to [facility] ALF after rehab stay  Patient will discharge to [place] Anticipated discharge date: 8/14 Family notified: pt dtr Transportation by [employer]- called at 3:30pm  CSW signing off.  Domenica Reamer, [place] Social Worker [phone]

## 2016-05-15 NOTE — Care Management Important Message (Signed)
Important Message  Patient Details  Name: [name]. MRN: [id number] Date of Birth: 1941-02-13   Medicare Important Message Given:  Yes    [name] Abena 05/15/2016, 10:52 AM

## 2016-05-15 NOTE — Care Management Note (Signed)
Case Management Note  Patient Details  Name: [name]. MRN: [id number] Date of Birth: December 31, 1940  Subjective/Objective:                 Patient to DC to SNF today as facilitated by CSW.   Action/Plan:   Expected Discharge Date:                  Expected Discharge Plan:  Skilled Nursing Facility  In-House Referral:  Clinical Social Work  Discharge planning Services  CM Consult  Post Acute Care Choice:  NA Choice offered to:  NA  DME Arranged:  N/A DME Agency:  NA  HH Arranged:  NA HH Agency:  NA  Status of Service:  Completed, signed off  If discussed at [place] of Stay Meetings, dates discussed:    Additional Comments:  Carles Collet, RN 05/15/2016, 1:35 PM

## 2016-05-15 NOTE — Clinical Social Work Placement (Signed)
   CLINICAL SOCIAL WORK PLACEMENT  NOTE  Date:  05/15/2016  Patient Details  Name: [name]. MRN: [id number] Date of Birth: [birth year]  Clinical Social Work is seeking post-discharge placement for this patient at the [place] level of care (*CSW will initial, date and re-position this form in  chart as items are completed):  Yes   Patient/family provided with [place] Work Department's list of facilities offering this level of care within the geographic area requested by the patient (or if unable, by the patient's family).  Yes   Patient/family informed of their freedom to choose among providers that offer the needed level of care, that participate in Medicare, Medicaid or managed care program needed by the patient, have an available bed and are willing to accept the patient.  Yes   Patient/family informed of [place] [facility]'s ownership interest in [facility] and [facility], as well as of the fact that they are under no obligation to receive care at these facilities.  PASRR submitted to EDS on 05/15/16     PASRR number received on 05/15/16     Existing PASRR number confirmed on       FL2 transmitted to all facilities in geographic area requested by pt/family on 05/15/16     FL2 transmitted to all facilities within larger geographic area on       Patient informed that his/her managed care company has contracts with or will negotiate with certain facilities, including the following:        Yes   Patient/family informed of bed offers received.  Patient chooses bed at [facility]     Physician recommends and patient chooses bed at      Patient to be transferred to [facility] on 05/15/16.  Patient to be transferred to facility by ptar     Patient family notified on 05/15/16 of transfer.  Name of family member notified:        PHYSICIAN       Additional Comment:     _______________________________________________ [name], LCSW 05/15/2016, 3:44 PM

## 2016-05-15 NOTE — Discharge Instructions (Signed)
You were admitted for a scalp laceration after a fall at your assisted living facility and possible infection (sepsis) and were treated with antibiotics. Your scalp was stapled and the staples need to removed on 05/17/16-05/19/16.  We decided to change your diabetes medication since your blood sugars have been under good control. Please continue your metformin but stop taking insulin.

## 2016-05-15 NOTE — Progress Notes (Signed)
Family Medicine Teaching Service Daily Progress Note Intern Pager: [phone]  Patient name: [name]. Medical record number: [id number] Date of birth: Jan 26, 1941 Age: 75 y.o. Gender: male  Primary Care Provider: No PCP Per Patient Consultants: none Code Status: FULL  Pt Overview and Major Events to Date:  8/9 Admitted to SDU for sepsis of unknown source  Assessment and Plan: [name]. is a 75 y.o. male presenting after fall . PMH is significant for HTN, DM2, CAD, CVA, COPD, ETOH use, tobacco disorder, protein calorie malnutrition, dementia  Sepsis, resolved. Vital signs now stable.  Unclear etiology, questionable UTI. Blood cultures negative, urine cultures with insignificant growth. Recently treated for UTI with Keflex as outpatient. Recent admission with blood cultures growing Micrococcus species in 1/2 bottles, likely contaminant. CXR on admission was clear, but was only a 1 view.  WBC down-trending from 15 to 12.2. Afebrile throughout hospital stay. S/p Vanc/zosyn (8/9-8/11), po levaquin (8/11-8/13).  - Completed 5 day course of antibiotics, monitor off antibiotics - Monitor WBC  Tachycardia in setting of h/o CAD, resolved: Likely 2/2 anemia. ACS unlikely with normal EKG and troponins neg x 3. Pt also denying chest pain. HR now 89-97 - monitor on tele  Normocytic Anemia, asymptomatic: resolved. Currently at baseline Hgb 9-11 with Hgb 9.1 this am - monitor CBC - transfuse <8, will give 1u PRBC this AM  - continue ASA 81 and heparin  Fall with scalp laceration: Likely mechanical vs presyncope in the setting of dehydration. CT head negative. - Telemetry - PT/OT recommending SNF - Fall precautions - Will need staples removed in 7-10 days (on 05/17/16-05/19/16)  AKI, resolved: Pre-renal in the setting of dehydration. Cr 3.66 on admit, improved to 1.0 with baseline 0.6-0.9.  - Avoid nephrotoxic agents - monitor BMP    DM2: Recent admission for hypoglycemia but blood  glucose has been within normal limits during this hospital stay. Last a1c 6.8 on 05/05/16. Likely do not need such tight blood sugar control in this 75 year old. Goal A1c <8.  - SSI sensitive, ACHS - Neurontin 300 TID  - at discharge would d/c insulin regimen and continue home metformin given well controlled A1C   Hypotension (resolved) with history of HTN: Hypotension resolved with IVFs. BP 132/67 this AM.   - Hold home Norvasc. Can add back if BPs remain elevated today. - monitor BP  h/o CVA. On Zocor 5 at home. 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 Stable. - start crestor 20mg  instead given comorbidities - Continue home baclofen    COPD, not in exacerbation. Stable on room air. - Monitor - O2 prn    Protein calorie malnutrition  - Consulted nutrition for assessment at last visit  Depression - continue home abilify  FEN/GI: heart/carb modified, pepcid Prophylaxis: SCDs  Disposition: PT/OT recommending SNF. SW consulted for placement.  Subjective:  States feels well this morning and has no concerns. Denies chest pain, abdominal pain,0 shortness of breath, urinary sx  Objective: Temp:  [98.1 F (36.7 C)-99 F (37.2 C)] 98.1 F (36.7 C) (08/14 0443) Pulse Rate:  [88-114] 97 (08/14 0443) Resp:  [18-22] 18 (08/14 0443) BP: (132-157)/(67-91) 132/67 (08/14 0443) SpO2:  [90 %-98 %] 90 % (08/14 0443) Physical Exam:  General: sitting up in bed, in NAD Cardiovascular: RRR, no murmurs appreciated Respiratory: CTAB, normal effort Abdomen: soft, non tender, +BS Extremities: left hand contracture Neuro: alert, no focal deficits besides limited movement of left arm Skin: laceration on occiput with staples intact  Laboratory:  Recent Labs Lab 05/12/16 0858 05/13/16 0315 05/14/16 0452 05/15/16 0205  WBC 9.4 15.0* 12.2*  --   HGB 7.3* 8.1* 7.5* 9.1*  HCT 23.5* 25.3* 24.0* 28.4*  PLT 235 254 257  --     Recent Labs Lab 05/10/16 1259  05/12/16 0858 05/13/16 0315  05/14/16 0452  NA 135  < > 137 137 138  K 4.8  < > 3.3* 3.6 3.3*  CL 105  < > 108 105 103  CO2 19*  < > 23 20* 25  BUN 47*  < > 14 11 9   CREATININE 3.66*  < > 1.20 1.03 1.00  CALCIUM 8.8*  < > 8.4* 9.0 8.7*  PROT 6.0*  --   --   --   --   BILITOT 0.5  --   --   --   --   ALKPHOS 64  --   --   --   --   ALT 20  --   --   --   --   AST 31  --   --   --   --   GLUCOSE 296*  < > 195* 171* 146*  < > = values in this interval not displayed. Urinalysis    Component Value Date/Time   COLORURINE YELLOW 05/10/2016 1741   APPEARANCEUR CLOUDY (A) 05/10/2016 1741   LABSPEC 1.042 (H) 05/10/2016 1741   PHURINE 5.0 05/10/2016 1741   GLUCOSEU NEGATIVE 05/10/2016 1741   HGBUR MODERATE (A) 05/10/2016 1741   BILIRUBINUR NEGATIVE 05/10/2016 1741   KETONESUR NEGATIVE 05/10/2016 1741   PROTEINUR NEGATIVE 05/10/2016 1741   NITRITE NEGATIVE 05/10/2016 1741   LEUKOCYTESUR MODERATE (A) 05/10/2016 1741     Imaging/Diagnostic Tests: No results found.  Bufford Lope, DO 05/15/2016, 6:45 AM PGY-1, [place] Intern pager: [phone], text pages welcome

## 2016-05-16 ENCOUNTER — Encounter: Payer: Self-pay | Admitting: Adult Health

## 2016-05-16 ENCOUNTER — Non-Acute Institutional Stay (SKILLED_NURSING_FACILITY): Payer: Medicare Other | Admitting: Adult Health

## 2016-05-16 DIAGNOSIS — I2510 Atherosclerotic heart disease of native coronary artery without angina pectoris: Secondary | ICD-10-CM

## 2016-05-16 DIAGNOSIS — D649 Anemia, unspecified: Secondary | ICD-10-CM | POA: Diagnosis not present

## 2016-05-16 DIAGNOSIS — E876 Hypokalemia: Secondary | ICD-10-CM

## 2016-05-16 DIAGNOSIS — J449 Chronic obstructive pulmonary disease, unspecified: Secondary | ICD-10-CM

## 2016-05-16 DIAGNOSIS — F323 Major depressive disorder, single episode, severe with psychotic features: Secondary | ICD-10-CM | POA: Diagnosis not present

## 2016-05-16 DIAGNOSIS — E1169 Type 2 diabetes mellitus with other specified complication: Secondary | ICD-10-CM | POA: Diagnosis not present

## 2016-05-16 DIAGNOSIS — I2583 Coronary atherosclerosis due to lipid rich plaque: Principal | ICD-10-CM

## 2016-05-16 DIAGNOSIS — Z8673 Personal history of transient ischemic attack (TIA), and cerebral infarction without residual deficits: Secondary | ICD-10-CM

## 2016-05-16 DIAGNOSIS — I10 Essential (primary) hypertension: Secondary | ICD-10-CM | POA: Diagnosis not present

## 2016-05-16 DIAGNOSIS — M62422 Contracture of muscle, left upper arm: Secondary | ICD-10-CM

## 2016-05-16 DIAGNOSIS — E785 Hyperlipidemia, unspecified: Secondary | ICD-10-CM

## 2016-05-16 DIAGNOSIS — K219 Gastro-esophageal reflux disease without esophagitis: Secondary | ICD-10-CM

## 2016-05-16 LAB — GLUCOSE, CAPILLARY
Glucose-Capillary: 196 mg/dL — ABNORMAL HIGH (ref 65–99)
Glucose-Capillary: 193 mg/dL — ABNORMAL HIGH (ref 65–99)
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
## 2016-05-22 ENCOUNTER — Encounter: Payer: Self-pay | Admitting: Internal Medicine

## 2016-05-22 ENCOUNTER — Non-Acute Institutional Stay (SKILLED_NURSING_FACILITY): Payer: Medicare Other | Admitting: Internal Medicine

## 2016-05-22 DIAGNOSIS — I10 Essential (primary) hypertension: Secondary | ICD-10-CM

## 2016-05-22 DIAGNOSIS — F1010 Alcohol abuse, uncomplicated: Secondary | ICD-10-CM

## 2016-05-22 DIAGNOSIS — D171 Benign lipomatous neoplasm of skin and subcutaneous tissue of trunk: Secondary | ICD-10-CM

## 2016-05-22 DIAGNOSIS — M62422 Contracture of muscle, left upper arm: Secondary | ICD-10-CM | POA: Diagnosis not present

## 2016-05-22 DIAGNOSIS — K219 Gastro-esophageal reflux disease without esophagitis: Secondary | ICD-10-CM

## 2016-05-22 DIAGNOSIS — F1011 Alcohol abuse, in remission: Secondary | ICD-10-CM

## 2016-05-22 DIAGNOSIS — E876 Hypokalemia: Secondary | ICD-10-CM

## 2016-05-22 DIAGNOSIS — E440 Moderate protein-calorie malnutrition: Secondary | ICD-10-CM | POA: Diagnosis not present

## 2016-05-22 DIAGNOSIS — F323 Major depressive disorder, single episode, severe with psychotic features: Secondary | ICD-10-CM | POA: Diagnosis not present

## 2016-05-22 DIAGNOSIS — D649 Anemia, unspecified: Secondary | ICD-10-CM

## 2016-05-22 DIAGNOSIS — K429 Umbilical hernia without obstruction or gangrene: Secondary | ICD-10-CM | POA: Diagnosis not present

## 2016-05-22 DIAGNOSIS — Z8673 Personal history of transient ischemic attack (TIA), and cerebral infarction without residual deficits: Secondary | ICD-10-CM

## 2016-05-22 LAB — CBC AND DIFFERENTIAL
HCT: 32 % — AB (ref 41–53)
HEMOGLOBIN: 9.3 g/dL — AB (ref 13.5–17.5)
PLATELETS: 515 10*3/uL — AB (ref 150–399)
WBC: 12.9 10*3/mL
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
## 2016-05-31 ENCOUNTER — Encounter: Payer: Self-pay | Admitting: Adult Health

## 2016-05-31 ENCOUNTER — Non-Acute Institutional Stay (SKILLED_NURSING_FACILITY): Payer: Medicare Other | Admitting: Adult Health

## 2016-05-31 DIAGNOSIS — E1169 Type 2 diabetes mellitus with other specified complication: Secondary | ICD-10-CM | POA: Diagnosis not present

## 2016-05-31 DIAGNOSIS — Z8673 Personal history of transient ischemic attack (TIA), and cerebral infarction without residual deficits: Secondary | ICD-10-CM

## 2016-05-31 DIAGNOSIS — E1149 Type 2 diabetes mellitus with other diabetic neurological complication: Secondary | ICD-10-CM

## 2016-05-31 DIAGNOSIS — I10 Essential (primary) hypertension: Secondary | ICD-10-CM | POA: Diagnosis not present

## 2016-05-31 DIAGNOSIS — E785 Hyperlipidemia, unspecified: Secondary | ICD-10-CM
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
## 2016-10-04 ENCOUNTER — Ambulatory Visit (INDEPENDENT_AMBULATORY_CARE_PROVIDER_SITE_OTHER): Payer: Medicare Other | Admitting: Gastroenterology

## 2016-10-04 ENCOUNTER — Encounter: Payer: Self-pay | Admitting: Gastroenterology

## 2016-10-04 VITALS — BP 124/76 | HR 70 | Ht 69.5 in | Wt 155.0 lb

## 2016-10-04 DIAGNOSIS — K2210 Ulcer of esophagus without bleeding: Secondary | ICD-10-CM | POA: Diagnosis not present

## 2016-10-04 DIAGNOSIS — K449 Diaphragmatic hernia without obstruction or gangrene: Secondary | ICD-10-CM

## 2016-10-04 DIAGNOSIS — R112 Nausea with vomiting, unspecified: Secondary | ICD-10-CM

## 2016-10-04 MED ORDER — PANTOPRAZOLE SODIUM 40 MG PO TBEC: 40.0000 mg | DELAYED_RELEASE_TABLET | Freq: Two times a day (BID) | ORAL | 3 refills | Status: DC
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
## 2016-10-04 NOTE — Patient Instructions (Addendum)
Stop Famotidine.  Start Pantoprazole 40 mg twice a day.  You have been scheduled for an abdominal xray at [facility] Radiology (1st floor of hospital) on 10/12/16 at 9:30 am. Please arrive 15 minutes prior to your appointment for registration. Make certain not to have anything to eat or drink after midnight prior to your appointment. Should you need to reschedule your appointment, please contact radiology at [phone]. This test typically takes about 30 minutes to perform.

## 2016-10-05 DIAGNOSIS — K449 Diaphragmatic hernia without obstruction or gangrene: Secondary | ICD-10-CM | POA: Insufficient documentation

## 2016-10-05 DIAGNOSIS — K2210 Ulcer of esophagus without bleeding: Secondary | ICD-10-CM | POA: Insufficient documentation

## 2016-10-05 NOTE — Progress Notes (Signed)
Agree with Ms. Alphia Kava management.  ? Paraesophageal hiatal hernia issues  Gatha Mayer, MD, [facility]

## 2016-10-12 ENCOUNTER — Telehealth: Payer: Self-pay

## 2016-10-12 ENCOUNTER — Ambulatory Visit (HOSPITAL_COMMUNITY)
Admission: RE | Admit: 2016-10-12 | Discharge: 2016-10-12 | Disposition: A | Discharge status: Home / Self Care | Payer: Medicare Other | Source: Ambulatory Visit | Attending: Gastroenterology | Admitting: Gastroenterology

## 2016-10-12 DIAGNOSIS — R112 Nausea with vomiting, unspecified: Secondary | ICD-10-CM

## 2016-10-12 DIAGNOSIS — K219 Gastro-esophageal reflux disease without esophagitis: Secondary | ICD-10-CM | POA: Diagnosis not present

## 2016-10-12 DIAGNOSIS — K449 Diaphragmatic hernia without obstruction or gangrene: Secondary | ICD-10-CM | POA: Insufficient documentation

## 2016-10-12 NOTE — Telephone Encounter (Signed)
Call report from [facility] radiology on UGI, let Alonza Bogus, PA know of report.

## 2016-10-13 ENCOUNTER — Encounter: Payer: Self-pay | Admitting: Internal Medicine
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
## 2016-12-06 ENCOUNTER — Other Ambulatory Visit: Payer: Self-pay

## 2016-12-06 DIAGNOSIS — K449 Diaphragmatic hernia without obstruction or gangrene: Secondary | ICD-10-CM

## 2016-12-06 NOTE — Progress Notes (Signed)
Referral has been faxed to [facility] Dr Hassell Done

## 2017-03-12 ENCOUNTER — Ambulatory Visit (INDEPENDENT_AMBULATORY_CARE_PROVIDER_SITE_OTHER): Payer: Medicare Other | Admitting: Orthopaedic Surgery

## 2017-04-25 ENCOUNTER — Emergency Department (HOSPITAL_COMMUNITY): Payer: Medicare Other

## 2017-04-25 ENCOUNTER — Other Ambulatory Visit: Payer: Self-pay

## 2017-04-25 ENCOUNTER — Inpatient Hospital Stay (HOSPITAL_COMMUNITY): Payer: Medicare Other

## 2017-04-25 ENCOUNTER — Inpatient Hospital Stay (HOSPITAL_COMMUNITY)
Admission: EM | Admit: 2017-04-25 | Discharge: 2017-05-15 | DRG: 100 | Disposition: A | Discharge status: Skilled Nursing Facility | Payer: Medicare Other | Attending: Internal Medicine | Admitting: Internal Medicine

## 2017-04-25 DIAGNOSIS — R1110 Vomiting, unspecified: Secondary | ICD-10-CM

## 2017-04-25 DIAGNOSIS — Z8701 Personal history of pneumonia (recurrent): Secondary | ICD-10-CM

## 2017-04-25 DIAGNOSIS — Z9842 Cataract extraction status, left eye: Secondary | ICD-10-CM

## 2017-04-25 DIAGNOSIS — I130 Hypertensive heart and chronic kidney disease with heart failure and stage 1 through stage 4 chronic kidney disease, or unspecified chronic kidney disease: Secondary | ICD-10-CM | POA: Diagnosis not present

## 2017-04-25 DIAGNOSIS — J9811 Atelectasis: Secondary | ICD-10-CM | POA: Diagnosis present

## 2017-04-25 DIAGNOSIS — E876 Hypokalemia: Secondary | ICD-10-CM | POA: Diagnosis present

## 2017-04-25 DIAGNOSIS — J9601 Acute respiratory failure with hypoxia: Secondary | ICD-10-CM

## 2017-04-25 DIAGNOSIS — E874 Mixed disorder of acid-base balance: Secondary | ICD-10-CM | POA: Diagnosis not present

## 2017-04-25 DIAGNOSIS — Z7982 Long term (current) use of aspirin: Secondary | ICD-10-CM

## 2017-04-25 DIAGNOSIS — J9621 Acute and chronic respiratory failure with hypoxia: Secondary | ICD-10-CM | POA: Diagnosis not present

## 2017-04-25 DIAGNOSIS — N390 Urinary tract infection, site not specified: Secondary | ICD-10-CM | POA: Diagnosis present

## 2017-04-25 DIAGNOSIS — Z789 Other specified health status: Secondary | ICD-10-CM | POA: Diagnosis not present

## 2017-04-25 DIAGNOSIS — Z794 Long term (current) use of insulin: Secondary | ICD-10-CM

## 2017-04-25 DIAGNOSIS — Z515 Encounter for palliative care: Secondary | ICD-10-CM | POA: Diagnosis not present

## 2017-04-25 DIAGNOSIS — J9690 Respiratory failure, unspecified, unspecified whether with hypoxia or hypercapnia: Secondary | ICD-10-CM

## 2017-04-25 DIAGNOSIS — R627 Adult failure to thrive: Secondary | ICD-10-CM

## 2017-04-25 DIAGNOSIS — G309 Alzheimer's disease, unspecified: Secondary | ICD-10-CM | POA: Diagnosis present

## 2017-04-25 DIAGNOSIS — Z79899 Other long term (current) drug therapy: Secondary | ICD-10-CM

## 2017-04-25 DIAGNOSIS — Z9911 Dependence on respirator [ventilator] status: Secondary | ICD-10-CM

## 2017-04-25 DIAGNOSIS — Z4659 Encounter for fitting and adjustment of other gastrointestinal appliance and device: Secondary | ICD-10-CM | POA: Diagnosis not present

## 2017-04-25 DIAGNOSIS — R570 Cardiogenic shock: Secondary | ICD-10-CM | POA: Diagnosis not present

## 2017-04-25 DIAGNOSIS — I69354 Hemiplegia and hemiparesis following cerebral infarction affecting left non-dominant side: Secondary | ICD-10-CM | POA: Diagnosis not present

## 2017-04-25 DIAGNOSIS — Z9841 Cataract extraction status, right eye: Secondary | ICD-10-CM

## 2017-04-25 DIAGNOSIS — Z961 Presence of intraocular lens: Secondary | ICD-10-CM | POA: Diagnosis present

## 2017-04-25 DIAGNOSIS — E43 Unspecified severe protein-calorie malnutrition: Secondary | ICD-10-CM | POA: Diagnosis present

## 2017-04-25 DIAGNOSIS — I472 Ventricular tachycardia: Secondary | ICD-10-CM | POA: Diagnosis present

## 2017-04-25 DIAGNOSIS — N182 Chronic kidney disease, stage 2 (mild): Secondary | ICD-10-CM | POA: Diagnosis present

## 2017-04-25 DIAGNOSIS — R579 Shock, unspecified: Secondary | ICD-10-CM

## 2017-04-25 DIAGNOSIS — I10 Essential (primary) hypertension: Secondary | ICD-10-CM | POA: Diagnosis not present

## 2017-04-25 DIAGNOSIS — E860 Dehydration: Secondary | ICD-10-CM | POA: Diagnosis present

## 2017-04-25 DIAGNOSIS — G40901 Epilepsy, unspecified, not intractable, with status epilepticus: Principal | ICD-10-CM | POA: Diagnosis present

## 2017-04-25 DIAGNOSIS — Z9289 Personal history of other medical treatment: Secondary | ICD-10-CM | POA: Diagnosis not present

## 2017-04-25 DIAGNOSIS — I442 Atrioventricular block, complete: Secondary | ICD-10-CM | POA: Diagnosis present

## 2017-04-25 DIAGNOSIS — Z5320 Procedure and treatment not carried out because of patient's decision for unspecified reasons: Secondary | ICD-10-CM | POA: Diagnosis present

## 2017-04-25 DIAGNOSIS — F17210 Nicotine dependence, cigarettes, uncomplicated: Secondary | ICD-10-CM | POA: Diagnosis present

## 2017-04-25 DIAGNOSIS — R569 Unspecified convulsions: Secondary | ICD-10-CM | POA: Diagnosis not present

## 2017-04-25 DIAGNOSIS — B9620 Unspecified Escherichia coli [E. coli] as the cause of diseases classified elsewhere: Secondary | ICD-10-CM | POA: Diagnosis present

## 2017-04-25 DIAGNOSIS — Z7189 Other specified counseling: Secondary | ICD-10-CM | POA: Diagnosis not present

## 2017-04-25 DIAGNOSIS — G9340 Encephalopathy, unspecified: Secondary | ICD-10-CM | POA: Diagnosis not present

## 2017-04-25 DIAGNOSIS — I639 Cerebral infarction, unspecified: Secondary | ICD-10-CM | POA: Diagnosis not present

## 2017-04-25 DIAGNOSIS — G9389 Other specified disorders of brain: Secondary | ICD-10-CM | POA: Diagnosis present

## 2017-04-25 DIAGNOSIS — I252 Old myocardial infarction: Secondary | ICD-10-CM | POA: Diagnosis not present

## 2017-04-25 DIAGNOSIS — Z9889 Other specified postprocedural states: Secondary | ICD-10-CM | POA: Diagnosis not present

## 2017-04-25 DIAGNOSIS — Z6822 Body mass index (BMI) 22.0-22.9, adult: Secondary | ICD-10-CM

## 2017-04-25 DIAGNOSIS — K5900 Constipation, unspecified: Secondary | ICD-10-CM | POA: Diagnosis present

## 2017-04-25 DIAGNOSIS — N179 Acute kidney failure, unspecified: Secondary | ICD-10-CM | POA: Diagnosis present

## 2017-04-25 DIAGNOSIS — I2510 Atherosclerotic heart disease of native coronary artery without angina pectoris: Secondary | ICD-10-CM | POA: Diagnosis present

## 2017-04-25 DIAGNOSIS — J9622 Acute and chronic respiratory failure with hypercapnia: Secondary | ICD-10-CM | POA: Diagnosis not present

## 2017-04-25 DIAGNOSIS — F0280 Dementia in other diseases classified elsewhere without behavioral disturbance: Secondary | ICD-10-CM | POA: Diagnosis present

## 2017-04-25 DIAGNOSIS — E873 Alkalosis: Secondary | ICD-10-CM | POA: Diagnosis present

## 2017-04-25 DIAGNOSIS — I959 Hypotension, unspecified: Secondary | ICD-10-CM | POA: Diagnosis present

## 2017-04-25 DIAGNOSIS — I5022 Chronic systolic (congestive) heart failure: Secondary | ICD-10-CM | POA: Diagnosis present

## 2017-04-25 DIAGNOSIS — E1165 Type 2 diabetes mellitus with hyperglycemia: Secondary | ICD-10-CM | POA: Diagnosis present

## 2017-04-25 DIAGNOSIS — E785 Hyperlipidemia, unspecified: Secondary | ICD-10-CM | POA: Diagnosis present

## 2017-04-25 DIAGNOSIS — R061 Stridor: Secondary | ICD-10-CM | POA: Diagnosis present

## 2017-04-25 DIAGNOSIS — E1122 Type 2 diabetes mellitus with diabetic chronic kidney disease: Secondary | ICD-10-CM | POA: Diagnosis present

## 2017-04-25 DIAGNOSIS — I4891 Unspecified atrial fibrillation: Secondary | ICD-10-CM | POA: Diagnosis not present

## 2017-04-25 DIAGNOSIS — R34 Anuria and oliguria: Secondary | ICD-10-CM | POA: Diagnosis present

## 2017-04-25 DIAGNOSIS — I480 Paroxysmal atrial fibrillation: Secondary | ICD-10-CM | POA: Diagnosis present

## 2017-04-25 DIAGNOSIS — E872 Acidosis: Secondary | ICD-10-CM | POA: Diagnosis present

## 2017-04-25 DIAGNOSIS — D649 Anemia, unspecified: Secondary | ICD-10-CM | POA: Diagnosis present

## 2017-04-25 DIAGNOSIS — D72825 Bandemia: Secondary | ICD-10-CM

## 2017-04-25 DIAGNOSIS — R0600 Dyspnea, unspecified: Secondary | ICD-10-CM

## 2017-04-25 LAB — COMPREHENSIVE METABOLIC PANEL
ALBUMIN: 2.1 g/dL — AB (ref 3.5–5.0)
ALK PHOS: 118 U/L (ref 38–126)
ALT: 37 U/L (ref 17–63)
AST: 83 U/L — AB (ref 15–41)
Anion gap: 11 (ref 5–15)
BILIRUBIN TOTAL: 0.8 mg/dL (ref 0.3–1.2)
BUN: 12 mg/dL (ref 6–20)
CALCIUM: 8.2 mg/dL — AB (ref 8.9–10.3)
CO2: 25 mmol/L (ref 22–32)
CREATININE: 1.35 mg/dL — AB (ref 0.61–1.24)
Chloride: 101 mmol/L (ref 101–111)
GFR calc Af Amer: 57 mL/min — ABNORMAL LOW (ref >60)
GFR calc non Af Amer: 49 mL/min — ABNORMAL LOW (ref >60)
GLUCOSE: 197 mg/dL — AB (ref 65–99)
Potassium: 2.5 mmol/L — CL (ref 3.5–5.1)
Sodium: 137 mmol/L (ref 135–145)
TOTAL PROTEIN: 7.4 g/dL (ref 6.5–8.1)

## 2017-04-25 LAB — I-STAT ARTERIAL BLOOD GAS, ED
Acid-Base Excess: 4 mmol/L — ABNORMAL HIGH (ref 0.0–2.0)
BICARBONATE: 30.1 mmol/L — AB (ref 20.0–28.0)
O2 Saturation: 100 %
TCO2: 32 mmol/L (ref 0–100)
pCO2 arterial: 52.5 mmHg — ABNORMAL HIGH (ref 32.0–48.0)
pH, Arterial: 7.367 (ref 7.350–7.450)
pO2, Arterial: 321 mmHg — ABNORMAL HIGH (ref 83.0–108.0)

## 2017-04-25 LAB — CBC
HEMATOCRIT: 35.2 % — AB (ref 39.0–52.0)
HEMOGLOBIN: 10.8 g/dL — AB (ref 13.0–17.0)
MCH: 21.7 pg — ABNORMAL LOW (ref 26.0–34.0)
MCHC: 30.7 g/dL (ref 30.0–36.0)
MCV: 70.7 fL — AB (ref 78.0–100.0)
Platelets: 257 10*3/uL (ref 150–400)
RBC: 4.98 MIL/uL (ref 4.22–5.81)
RDW: 22.6 % — ABNORMAL HIGH (ref 11.5–15.5)
WBC: 9 10*3/uL (ref 4.0–10.5)

## 2017-04-25 LAB — I-STAT CHEM 8, ED
BUN: 16 mg/dL (ref 6–20)
CALCIUM ION: 0.99 mmol/L — AB (ref 1.15–1.40)
CHLORIDE: 99 mmol/L — AB (ref 101–111)
CREATININE: 1.2 mg/dL (ref 0.61–1.24)
GLUCOSE: 201 mg/dL — AB (ref 65–99)
HCT: 37 % — ABNORMAL LOW (ref 39.0–52.0)
Hemoglobin: 12.6 g/dL — ABNORMAL LOW (ref 13.0–17.0)
Potassium: 2.6 mmol/L — CL (ref 3.5–5.1)
Sodium: 138 mmol/L (ref 135–145)
TCO2: 28 mmol/L (ref 0–100)

## 2017-04-25 LAB — I-STAT TROPONIN, ED: Troponin i, poc: 0.03 ng/mL (ref 0.00–0.08)

## 2017-04-25 LAB — MAGNESIUM: Magnesium: 1.7 mg/dL (ref 1.7–2.4)

## 2017-04-25 LAB — DIFFERENTIAL
BASOS ABS: 0 10*3/uL (ref 0.0–0.1)
BASOS PCT: 0 %
EOS PCT: 0 %
Eosinophils Absolute: 0 10*3/uL (ref 0.0–0.7)
Lymphocytes Relative: 28 %
Lymphs Abs: 2.5 10*3/uL (ref 0.7–4.0)
MONOS PCT: 7 %
Monocytes Absolute: 0.6 10*3/uL (ref 0.1–1.0)
Neutro Abs: 5.9 10*3/uL (ref 1.7–7.7)
Neutrophils Relative %: 65 %

## 2017-04-25 LAB — APTT: APTT: 26 s (ref 24–36)

## 2017-04-25 LAB — PROTIME-INR
INR: 1.2
Prothrombin Time: 15.3 seconds — ABNORMAL HIGH (ref 11.4–15.2)

## 2017-04-25 LAB — PHOSPHORUS: PHOSPHORUS: 3.1 mg/dL (ref 2.5–4.6)

## 2017-04-25 MED ORDER — POTASSIUM CHLORIDE 10 MEQ/100ML IV SOLN
INTRAVENOUS | Status: AC
  Filled 2017-04-25: qty 100

## 2017-04-25 MED ORDER — MIDAZOLAM HCL 2 MG/2ML IJ SOLN
2.0000 mg | Freq: Once | INTRAMUSCULAR | Status: AC
  Administered 2017-04-25: 2 mg via INTRAVENOUS
  Filled 2017-04-25: qty 2

## 2017-04-25 MED ORDER — POTASSIUM CHLORIDE 10 MEQ/100ML IV SOLN
10.0000 meq | INTRAVENOUS | Status: AC
  Administered 2017-04-26 (×4): 10 meq via INTRAVENOUS
  Filled 2017-04-25 (×2): qty 100

## 2017-04-25 MED ORDER — INSULIN ASPART 100 UNIT/ML ~~LOC~~ SOLN: 2.0000 [IU] | SUBCUTANEOUS | Status: DC

## 2017-04-25 MED ORDER — FENTANYL CITRATE (PF) 100 MCG/2ML IJ SOLN
100.0000 ug | Freq: Once | INTRAMUSCULAR | Status: AC
  Administered 2017-04-25: 100 ug via INTRAVENOUS

## 2017-04-25 MED ORDER — DEXTROSE 50 % IV SOLN
INTRAVENOUS | Status: AC
  Administered 2017-04-25: 17:00:00
  Filled 2017-04-25: qty 50

## 2017-04-25 MED ORDER — FENTANYL CITRATE (PF) 100 MCG/2ML IJ SOLN
50.0000 ug | INTRAMUSCULAR | Status: DC | PRN
Start: 2017-04-25 — End: 2017-04-27
  Administered 2017-04-25 – 2017-04-26 (×2): 50 ug via INTRAVENOUS
  Filled 2017-04-25 (×2): qty 2

## 2017-04-25 MED ORDER — SODIUM CHLORIDE 0.9 % IV SOLN
1000.0000 mg | Freq: Once | INTRAVENOUS | Status: AC
  Administered 2017-04-25: 1000 mg via INTRAVENOUS
  Filled 2017-04-25: qty 20

## 2017-04-25 MED ORDER — PANTOPRAZOLE SODIUM 40 MG IV SOLR
40.0000 mg | Freq: Every day | INTRAVENOUS | Status: DC
  Administered 2017-04-26 (×2): 40 mg via INTRAVENOUS
  Filled 2017-04-25 (×2): qty 40

## 2017-04-25 MED ORDER — EPINEPHRINE PF 1 MG/10ML IJ SOSY
PREFILLED_SYRINGE | INTRAMUSCULAR | Status: AC | PRN
  Administered 2017-04-25 (×5): 1 mg via INTRAVENOUS

## 2017-04-25 MED ORDER — LEVETIRACETAM 500 MG/5ML IV SOLN
500.0000 mg | Freq: Two times a day (BID) | INTRAVENOUS | Status: DC
  Administered 2017-04-26 – 2017-05-11 (×31): 500 mg via INTRAVENOUS
  Filled 2017-04-25 (×38): qty 5

## 2017-04-25 MED ORDER — LORAZEPAM 2 MG/ML IJ SOLN
2.0000 mg | Freq: Once | INTRAMUSCULAR | Status: AC
Start: 2017-04-25 — End: 2017-04-25
  Administered 2017-04-25: 2 mg via INTRAVENOUS

## 2017-04-25 MED ORDER — FENTANYL CITRATE (PF) 100 MCG/2ML IJ SOLN
50.0000 ug | INTRAMUSCULAR | Status: DC | PRN
  Administered 2017-04-26 – 2017-05-01 (×15): 50 ug via INTRAVENOUS
  Filled 2017-04-25 (×16): qty 2

## 2017-04-25 MED ORDER — SODIUM CHLORIDE 0.9 % IV SOLN
1000.0000 mg | Freq: Once | INTRAVENOUS | Status: AC
  Administered 2017-04-25: 1000 mg via INTRAVENOUS
  Filled 2017-04-25: qty 10

## 2017-04-25 MED ORDER — LORAZEPAM 2 MG/ML IJ SOLN
INTRAMUSCULAR | Status: AC
  Administered 2017-04-25: 1 mg via INTRAVENOUS
  Filled 2017-04-25: qty 1

## 2017-04-25 MED ORDER — DEXTROSE 5 % IV SOLN
0.0000 ug/min | Freq: Once | INTRAVENOUS | Status: DC
  Filled 2017-04-25: qty 4

## 2017-04-25 MED ORDER — HEPARIN SODIUM (PORCINE) 5000 UNIT/ML IJ SOLN
5000.0000 [IU] | Freq: Three times a day (TID) | INTRAMUSCULAR | Status: DC
  Administered 2017-04-26 – 2017-05-15 (×57): 5000 [IU] via SUBCUTANEOUS
  Filled 2017-04-25 (×60): qty 1

## 2017-04-25 MED ORDER — EPINEPHRINE PF 1 MG/ML IJ SOLN
0.5000 ug/min | INTRAMUSCULAR | Status: DC
  Administered 2017-04-25: 5 ug/min via INTRAVENOUS
  Filled 2017-04-25: qty 4

## 2017-04-25 MED ORDER — LORAZEPAM 2 MG/ML IJ SOLN
1.0000 mg | Freq: Once | INTRAMUSCULAR | Status: AC
Start: 2017-04-25 — End: 2017-04-25
  Administered 2017-04-25: 1 mg via INTRAMUSCULAR

## 2017-04-25 MED ORDER — POTASSIUM CHLORIDE 20 MEQ/15ML (10%) PO SOLN
40.0000 meq | Freq: Once | ORAL | Status: AC
  Administered 2017-04-26: 40 meq
  Filled 2017-04-25: qty 30

## 2017-04-25 MED ORDER — POTASSIUM CHLORIDE 10 MEQ/100ML IV SOLN
10.0000 meq | Freq: Once | INTRAVENOUS | Status: AC
Start: 2017-04-25 — End: 2017-04-25
  Administered 2017-04-25: 10 meq via INTRAVENOUS

## 2017-04-25 MED ORDER — SODIUM CHLORIDE 0.9 % IV SOLN
0.5000 mg/h | INTRAVENOUS | Status: DC
  Administered 2017-04-25: 0.5 mg/h via INTRAVENOUS
  Filled 2017-04-25: qty 10

## 2017-04-25 MED ORDER — SODIUM CHLORIDE 0.9 % IV SOLN
250.0000 mL | INTRAVENOUS | Status: DC | PRN
  Administered 2017-05-12: 250 mL via INTRAVENOUS

## 2017-04-25 MED ORDER — FENTANYL CITRATE (PF) 100 MCG/2ML IJ SOLN
INTRAMUSCULAR | Status: AC
  Filled 2017-04-25: qty 2

## 2017-04-25 MED ORDER — FENTANYL CITRATE (PF) 100 MCG/2ML IJ SOLN
50.0000 ug | Freq: Once | INTRAMUSCULAR | Status: AC
  Administered 2017-04-25: 50 ug via INTRAVENOUS
  Filled 2017-04-25: qty 2

## 2017-04-25 MED ORDER — NOREPINEPHRINE BITARTRATE 1 MG/ML IV SOLN
0.0000 ug/min | INTRAVENOUS | Status: DC
  Administered 2017-04-25: 2 ug/min via INTRAVENOUS
  Administered 2017-04-26: 6 ug/min via INTRAVENOUS
  Filled 2017-04-25 (×2): qty 4

## 2017-04-25 MED ORDER — LORAZEPAM 2 MG/ML IJ SOLN
INTRAMUSCULAR | Status: AC
  Administered 2017-04-25: 1 mg via INTRAMUSCULAR
  Filled 2017-04-25: qty 1
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
## 2017-04-25 NOTE — ED Notes (Signed)
Pt HR noted to be in the 40's. MD at the bedside. EPI orders given verbally.
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
## 2017-04-25 NOTE — Progress Notes (Signed)
S/O: Intubated and sedated. S/P code blue due to cardiac side effects of Dilantin infusion. Dilantin was discontinued prior to finishing load.   BP (!) 116/92   Pulse (!) 136   Resp 16   SpO2 100%   Gen: Frail appearing elderly patient with no spontaneous movement.  Ment: No response to verbal or noxious stimuli. Eyes closed; does not open to verbal or tactile stimuli. No attempts to communicated. Does not attend to any external stimulus. Mildly sedated with 0.5 mg/hr Versed.  CN: No blink to threat. Pupils sluggishly reactive bilaterally. Eyes conjugate. Weak doll's eye reflex. Flaccid facial motor tone.  Motor/Sensory:  RUE: No movement to sternal rub. Decreased muscle bulk. Increased tone. LUE: Flexion contracture at elbow, wrist, PIP and DIP joints. Chronic hyperextension of MCP joints. No movement to noxious stimuli.  RLE: Weak 2-5 withdrawal to noxious LLE: Weak 2-5 withdrawal to noxious Decreased muscle bulk x 4.  Reflexes: Hypoactive x 4.   EEG reviewed. Preliminary findings are as follows: Diffuse slowing. No electrographic seizures. Occasional right sided sharp waves.   A/R: 76 year old male with history of dementia and stroke with residual left-sided weakness, presents with slurred speech, followed by new onset seizure activity in the ED. 1. Current exam most consistent with postictal state. No electrographic seizures on EEG.  2. Continue Keppra 1000 mg. Continue scheduled Keppra at 500 mg IV BID. 3. Continue low-dose Versed. Taper off in the AM.  4. Seizure precautions.  5. Dilantin discontinued due to adverse cardiac effects.  6. Frequent neuro checks 7. MRI brain when stable  Electronically signed: Dr. Kerney Elbe

## 2017-04-25 NOTE — ED Notes (Signed)
Pt family left. Please call Rosa with room number when assigned.

## 2017-04-25 NOTE — ED Notes (Signed)
Pt noted to have seizure like activity. MD notified. MD verbal order for 2mg  IV Versed. bolused from bag.

## 2017-04-25 NOTE — ED Notes (Signed)
Paged Wannetta Sender, [place] to [facility]

## 2017-04-25 NOTE — Progress Notes (Signed)
Informed by the emergency room attending that after receiving Dilantin IV, the patient went into complete heart block and coded. He is currently intubated and being evaluated by cardiology for a possible pacemaker. I have recommended to use Versed drip. Also can use Vimpat in case third AED needed.

## 2017-04-25 NOTE — ED Notes (Signed)
Family remains at bedside.

## 2017-04-25 NOTE — Code Documentation (Signed)
76yo male arriving to [facility] via [place] at 68.  Patient from home with left sided deficits per EMS.  LKW at 1230 per report.  Patient with h/o stroke.  Of note, patient with hypoglycemic event yesterday requiring EMS treatment, however, no transfer to the hospital.  Today patient found altered by family.  Patient with focal motor seizure on arrival to the ED per EMS.  Neurologist at the bedside on patient arrival. Patient to CT.  Stroke RN to the bedside.  CT completed.  Patient with left facial twitching while in CT.  Ativan 1mg  IM given per MD.  PIV placed and patient transferred to A10.  Oxygen applied, CBG and vital signs assessed.  Ativan 2mg  IVP given per MD followed by Keppra IV.  NIHSS 31, see documentation for details and code stroke times.  Neurologist and ED MD at the bedside for plan of care.  Code stroke canceled.  Handoff with ED RN Crystal.

## 2017-04-25 NOTE — ED Notes (Signed)
[id number] [name]
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
## 2017-04-25 NOTE — ED Notes (Signed)
Pt still not responding appropriately. MD to intubate. Pt BP dropped to the 94'K systolic.

## 2017-04-25 NOTE — Progress Notes (Signed)
STAT LTM EEG, order changed to STAT spot EEG per Dr Cheral Marker. Results pending

## 2017-04-26 ENCOUNTER — Encounter (HOSPITAL_COMMUNITY): Payer: Self-pay | Admitting: *Deleted

## 2017-04-26 ENCOUNTER — Inpatient Hospital Stay (HOSPITAL_COMMUNITY): Payer: Medicare Other

## 2017-04-26 DIAGNOSIS — R569 Unspecified convulsions: Secondary | ICD-10-CM

## 2017-04-26 DIAGNOSIS — G9340 Encephalopathy, unspecified: Secondary | ICD-10-CM

## 2017-04-26 DIAGNOSIS — J9601 Acute respiratory failure with hypoxia: Secondary | ICD-10-CM

## 2017-04-26 LAB — RAPID URINE DRUG SCREEN, HOSP PERFORMED
Amphetamines: NOT DETECTED
BENZODIAZEPINES: POSITIVE — AB
Barbiturates: NOT DETECTED
COCAINE: NOT DETECTED
Opiates: NOT DETECTED
Tetrahydrocannabinol: NOT DETECTED

## 2017-04-26 LAB — BASIC METABOLIC PANEL
Anion gap: 7 (ref 5–15)
BUN: 12 mg/dL (ref 6–20)
CO2: 27 mmol/L (ref 22–32)
CREATININE: 1.19 mg/dL (ref 0.61–1.24)
Calcium: 7.4 mg/dL — ABNORMAL LOW (ref 8.9–10.3)
Chloride: 104 mmol/L (ref 101–111)
GFR, EST NON AFRICAN AMERICAN: 58 mL/min — AB (ref >60)
Glucose, Bld: 349 mg/dL — ABNORMAL HIGH (ref 65–99)
Potassium: 2.8 mmol/L — ABNORMAL LOW (ref 3.5–5.1)
SODIUM: 138 mmol/L (ref 135–145)

## 2017-04-26 LAB — PHOSPHORUS
PHOSPHORUS: 3.3 mg/dL (ref 2.5–4.6)
PHOSPHORUS: 3 mg/dL (ref 2.5–4.6)
Phosphorus: 2.3 mg/dL — ABNORMAL LOW (ref 2.5–4.6)

## 2017-04-26 LAB — CBC
HCT: 29 % — ABNORMAL LOW (ref 39.0–52.0)
Hemoglobin: 8.6 g/dL — ABNORMAL LOW (ref 13.0–17.0)
MCH: 21 pg — ABNORMAL LOW (ref 26.0–34.0)
MCHC: 29.7 g/dL — ABNORMAL LOW (ref 30.0–36.0)
MCV: 70.9 fL — ABNORMAL LOW (ref 78.0–100.0)
PLATELETS: 224 10*3/uL (ref 150–400)
RBC: 4.09 MIL/uL — ABNORMAL LOW (ref 4.22–5.81)
RDW: 22.3 % — AB (ref 11.5–15.5)
WBC: 10.5 10*3/uL (ref 4.0–10.5)

## 2017-04-26 LAB — GLUCOSE, CAPILLARY
GLUCOSE-CAPILLARY: 173 mg/dL — AB (ref 65–99)
GLUCOSE-CAPILLARY: 91 mg/dL (ref 65–99)
GLUCOSE-CAPILLARY: 81 mg/dL (ref 65–99)
GLUCOSE-CAPILLARY: 54 mg/dL — AB (ref 65–99)
GLUCOSE-CAPILLARY: 102 mg/dL — AB (ref 65–99)
Glucose-Capillary: 327 mg/dL — ABNORMAL HIGH (ref 65–99)
Glucose-Capillary: 281 mg/dL — ABNORMAL HIGH (ref 65–99)
Glucose-Capillary: 50 mg/dL — ABNORMAL LOW (ref 65–99)
Glucose-Capillary: 103 mg/dL — ABNORMAL HIGH (ref 65–99)
Glucose-Capillary: 18 mg/dL — CL (ref 65–99)
Glucose-Capillary: 60 mg/dL — ABNORMAL LOW (ref 65–99)
Glucose-Capillary: 115 mg/dL — ABNORMAL HIGH (ref 65–99)

## 2017-04-26 LAB — ECHOCARDIOGRAM COMPLETE
Height: 72 in
Weight: 2243.4 oz

## 2017-04-26 LAB — MRSA PCR SCREENING: MRSA by PCR: NEGATIVE

## 2017-04-26 LAB — PROCALCITONIN: PROCALCITONIN: 0.5 ng/mL

## 2017-04-26 LAB — CBG MONITORING, ED: GLUCOSE-CAPILLARY: 320 mg/dL — AB (ref 65–99)

## 2017-04-26 LAB — MAGNESIUM
MAGNESIUM: 1.5 mg/dL — AB (ref 1.7–2.4)
MAGNESIUM: 2.4 mg/dL (ref 1.7–2.4)
Magnesium: 1.9 mg/dL (ref 1.7–2.4)

## 2017-04-26 MED ORDER — POTASSIUM CHLORIDE 20 MEQ/15ML (10%) PO SOLN
40.0000 meq | ORAL | Status: AC
  Administered 2017-04-26 (×2): 40 meq
  Filled 2017-04-26 (×2): qty 30

## 2017-04-26 MED ORDER — POTASSIUM CHLORIDE 10 MEQ/100ML IV SOLN
INTRAVENOUS | Status: AC
Start: 2017-04-26 — End: 2017-04-26
  Filled 2017-04-26: qty 100

## 2017-04-26 MED ORDER — MAGNESIUM SULFATE 2 GM/50ML IV SOLN
2.0000 g | Freq: Once | INTRAVENOUS | Status: AC
  Administered 2017-04-26: 2 g via INTRAVENOUS
  Filled 2017-04-26: qty 50

## 2017-04-26 MED ORDER — POTASSIUM CHLORIDE 10 MEQ/100ML IV SOLN
INTRAVENOUS | Status: AC
  Filled 2017-04-26: qty 100

## 2017-04-26 MED ORDER — PRO-STAT SUGAR FREE PO LIQD
30.0000 mL | Freq: Two times a day (BID) | ORAL | Status: DC
  Administered 2017-04-26: 30 mL
  Filled 2017-04-26: qty 30

## 2017-04-26 MED ORDER — SODIUM CHLORIDE 0.9 % IV SOLN
0.0000 ug/min | INTRAVENOUS | Status: DC
  Administered 2017-04-26: 50 ug/min via INTRAVENOUS
  Filled 2017-04-26 (×2): qty 1

## 2017-04-26 MED ORDER — INSULIN ASPART 100 UNIT/ML ~~LOC~~ SOLN
0.0000 [IU] | SUBCUTANEOUS | Status: DC
  Administered 2017-04-26: 5 [IU] via SUBCUTANEOUS
  Administered 2017-04-26: 7 [IU] via SUBCUTANEOUS

## 2017-04-26 MED ORDER — VITAL HIGH PROTEIN PO LIQD
1000.0000 mL | ORAL | Status: DC
  Filled 2017-04-26: qty 1000

## 2017-04-26 MED ORDER — LACTATED RINGERS IV BOLUS (SEPSIS)
500.0000 mL | Freq: Once | INTRAVENOUS | Status: AC
Start: 2017-04-26 — End: 2017-04-26
  Administered 2017-04-26: 500 mL via INTRAVENOUS

## 2017-04-26 MED ORDER — DEXTROSE 50 % IV SOLN
25.0000 mL | Freq: Once | INTRAVENOUS | Status: AC
  Administered 2017-04-26: 25 mL via INTRAVENOUS

## 2017-04-26 MED ORDER — DEXTROSE-NACL 5-0.45 % IV SOLN
INTRAVENOUS | Status: DC
  Administered 2017-04-26: 13:00:00 via INTRAVENOUS

## 2017-04-26 MED ORDER — MAGNESIUM SULFATE 2 GM/50ML IV SOLN
2.0000 g | Freq: Once | INTRAVENOUS | Status: AC
Start: 2017-04-26 — End: 2017-04-26
  Administered 2017-04-26: 2 g via INTRAVENOUS
  Filled 2017-04-26: qty 50

## 2017-04-26 MED ORDER — DEXTROSE 50 % IV SOLN
INTRAVENOUS | Status: AC
  Administered 2017-04-26: 50 mL
  Filled 2017-04-26: qty 50

## 2017-04-26 MED ORDER — INSULIN ASPART 100 UNIT/ML ~~LOC~~ SOLN: 5.0000 [IU] | SUBCUTANEOUS | Status: DC

## 2017-04-26 MED ORDER — CHLORHEXIDINE GLUCONATE 0.12% ORAL RINSE (MEDLINE KIT)
15.0000 mL | Freq: Two times a day (BID) | OROMUCOSAL | Status: DC
  Administered 2017-04-26 – 2017-05-03 (×16): 15 mL via OROMUCOSAL

## 2017-04-26 MED ORDER — DEXTROSE 50 % IV SOLN
INTRAVENOUS | Status: AC
  Filled 2017-04-26: qty 50

## 2017-04-26 MED ORDER — POTASSIUM CHLORIDE 10 MEQ/100ML IV SOLN
10.0000 meq | INTRAVENOUS | Status: AC
  Administered 2017-04-26 (×3): 10 meq via INTRAVENOUS
  Filled 2017-04-26 (×7): qty 100

## 2017-04-26 MED ORDER — POTASSIUM CHLORIDE 20 MEQ/15ML (10%) PO SOLN: 40.0000 meq | Freq: Once | ORAL | Status: DC

## 2017-04-26 MED ORDER — VITAL HIGH PROTEIN PO LIQD
1000.0000 mL | ORAL | Status: DC
  Administered 2017-04-26 – 2017-04-30 (×6): 1000 mL
  Filled 2017-04-26 (×3): qty 1000

## 2017-04-26 MED ORDER — ORAL CARE MOUTH RINSE
15.0000 mL | OROMUCOSAL | Status: DC
  Administered 2017-04-26 – 2017-05-04 (×80): 15 mL via OROMUCOSAL

## 2017-04-26 MED FILL — Medication: Qty: 1 | Status: AC

## 2017-04-26 NOTE — Progress Notes (Signed)
Neurology progress note  Subjective: [name] was in bed on my visit, intubated and unresponsive to voice or noxious stimuli. Overnight, no reported seizure activity. EEG completed.  Current Pertinent Medications: Keppra 500mg  BID Versed .5mg /hr stopped 7/26 Levophed 23mcg/min  Pertinent Labs/Diagnostics: CT head 7/25: Atrophy and chronic ischemic change.  No acute abnormality. EEG pending MRI pending  Physical Examination: Vitals:   04/26/17 0700 04/26/17 0808  BP: 117/73 107/65  Pulse: (!) 45 (!) 110  Resp: 17   Temp:  (!) 96.1 F (35.6 C)    General: WDWN male.  HEENT:  Normocephalic, no lesions, without obvious abnormality.  Normal external eye and conjunctiva.  Normal TM's bilaterally.  Normal auditory canals and external ears. Normal external nose, mucus membranes and septum.  Normal pharynx. Cardiovascular: S1, S2 normal, pulses palpable throughout   Pulmonary: Chest clear. Ventilated Abdomen: Soft Extremities: no joint deformities, effusion, or inflammation Musculoskeletal: no  deformity or swelling Skin: warm and dry, no hyperpigmentation, vitiligo, or suspicious lesions  Neurological Examination:  CN: Pupils are equal and round, 44mm, nonreactive.  Motor: Does not attend to any external stimulus. No movement of the left to noxious stimuli. Right with minimal withdrawal Sensation: As above DTRs: 1+ throughout  Coordination: He does not perform   Assessment: 76 year old male with a past medical history significant for stroke with left sided deficits and dementia presented to ED with slurred speech that was followed by witnessed seizure activity. Coded with partial dilantin infusion. Medication changes pending EEG findings. Stroke workup pending MRI findings.  Recommendations: 1) Continue medications as above 2) We will continue to follow   Solon Augusta PA-C [facility] [phone]  04/26/2017, 9:51 AM  Neuro hospitalist  addendum S: Patient seen and examined as above. Was on Versed drip until early morning when it was discontinued. On PRN sedatives for now.  O: Examination Response to voice or noxious stim. Pupils equal round reactive to light. No gaze deviation. Breathing over the vent Corneals positive Cough positive gag positive No response to noxious stim in terms of motor movements.  EEG done overnight-no seizures. Generalized slowing. MRI pending Hgb trending down. WBC count stable. Cr. Stable and in normal range  A: 76 year old woman past medical history of stroke left-sided deficits, dementia presented in A. 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 He was given benzodiazepines, and IV Keppra followed by IV Dilantin. His heart went into a complete heart block with partial infusion of Dilantin. He had to be resuscitated, intubated and has been admitted into the ICU since yesterday evening. Impression Seizure Epilepsia partialis continua - resolved Toxic metabolic encephalopathy  Recs: -Continue with Keppra 500 BID -PRN benzos for clinical seizures -If exam does not improve off of sedation, consider cEEG for possible underlying non convulsive status. -Correction of toxic/metabolic derangements per PCCM. -MRI when stable and able. We will follow. ---- Amie Portland, MD [facility] [phone]  If 7pm to 7am, please call on call as listed on AMION.

## 2017-04-26 NOTE — Progress Notes (Signed)
  Echocardiogram 2D Echocardiogram has been performed.  [name] 04/26/2017, 10:50 AM

## 2017-04-26 NOTE — Progress Notes (Signed)
Patient transported to radiology department for MRI. Placed on MRI compatible monitoring devices, moved to MRI stretcher. Patient with stable vital signs. Rhythm noted however to change frequently between NSR and atrial fibrillation.

## 2017-04-26 NOTE — Procedures (Signed)
EEG report.  Date of recording 04/25/2017.  Referring physician Dr. Bruce Donath  Reason for the study At 76 year old male with history of diabetes hypertension and prior history of stroke presented with new onset seizures. Currently patient is intubated.  Technical This is a multichannel digital EEG recording using the international 10-20 placement system  Description of the recording. EEG comprised of generalized delta and theta slowing. Posterior background varies between 4-8 Hz and is symmetrical. Epileptiform features were not seen during this recording.  Impression EEG is abnormal and findings are suggestive of moderate generalized cerebral dysfunction. Epileptiform features were not seen during this recording.

## 2017-04-26 NOTE — Progress Notes (Signed)
Patient transported back to 4N23 after MRI scan completed. Patient stable on arrival. Brief report given to bedside RN.

## 2017-04-26 NOTE — Progress Notes (Signed)
Pt now having episodes of hypoglycemia  Plan Cont CBG checks Add D51/2 saline Start tubefeeds after MRI Dc insulin for now.  May need to add back sensitive scale.   Erick Colace ACNP-BC [place] Pager # [phone] OR # [phone] if no answer

## 2017-04-26 NOTE — Progress Notes (Signed)
[place] Progress Note Patient Name: [name]. DOB: 1941-01-26 MRN: [id number]   Date of Service  04/26/2017  HPI/Events of Note  Blood glucose = 327.  eICU Interventions  Will change to Q 4 hour sensative Novolog SSI.     Intervention Category Major Interventions: Hyperglycemia - active titration of insulin therapy  Kroy Sprung [place] 04/26/2017, 1:46 AM

## 2017-04-26 NOTE — ED Notes (Signed)
Pt watch and clothes sent with Pt to 4N

## 2017-04-26 NOTE — Progress Notes (Signed)
Per protocol I&O cath done after bladder scan indicated 588cc in bladder. 200cc of bright red bloody urine emptied with large clots noted. Marni Griffon NP informed. Order for foley catheter and irrigation given.

## 2017-04-26 NOTE — Progress Notes (Signed)
Inpatient Diabetes Program Recommendations  AACE/ADA: New Consensus Statement on Inpatient Glycemic Control (2015)  Target Ranges:  Prepandial:   less than 140 mg/dL      Peak postprandial:   less than 180 mg/dL (1-2 hours)      Critically ill patients:  140 - 180 mg/dL   Lab Results  Component Value Date   GLUCAP 81 04/26/2017   HGBA1C 6.8 (H) 05/05/2016    Review of Glycemic Control  Diabetes history: DM2 Outpatient Diabetes medications: Lantus 20 units QHS Current orders for Inpatient glycemic control: CBG monitoring Q4H. Novolog 0-9 units Q4H discontinued  Hyperglycemia in 300s early am. Hypoglycemia (18 mg/dL) at 1127. Had received 12 units of Novolog for hyperglycemia. To start TFs today.   Inpatient Diabetes Program Recommendations:    ICU Glycemic Control Order Set Need updated HgbA1C  Will follow closely.  Thank you. Lorenda Peck, RD, LDN, CDE Inpatient Diabetes Coordinator [phone]

## 2017-04-26 NOTE — Progress Notes (Signed)
Initial Nutrition Assessment  DOCUMENTATION CODES:   Not applicable  INTERVENTION:   Tube Feeding:   Vital High Protein at 60 ml/hr providing 1440 kcals, 127 g of protein and 1210 mL of free water. Meets 100% estimated calorie and protein needs. PEPuP initiated.   Electrolytes being rechecked via Adult Tube Feeding Protocol   NUTRITION DIAGNOSIS:   Inadequate oral intake related to acute illness as evidenced by NPO status.  GOAL:   Provide needs based on ASPEN/SCCM guidelines  MONITOR:   TF tolerance, Vent status, Labs, Weight trends  REASON FOR ASSESSMENT:   Consult, Ventilator Enteral/tube feeding initiation and management  ASSESSMENT:    76 yo male admitted with VDRF due to inability to protect airway in setting of seizures and AMS, possible aspiration during intubation. Pt with hx of dementia, DM, old CVA, HTN, ulcerative esophagitis   Patient is currently intubated on ventilator support MV: 7.6 L/min Temp (24hrs), Avg:97.2 F (36.2 C), Min:96.1 F (35.6 C), Max:97.7 F (36.5 C)  OG tube in place  Unable to complete Nutrition-Focused physical exam at this time.   Labs: potassium 2.8 (supplemented), magnesium 1.5 (supplemented), phosphorus wdl; CBGs 50-327 Meds: fentanyl, versed  Diet Order:  Diet NPO time specified  Skin:  Reviewed, no issues  Last BM:  no documented BM  Height:   Ht Readings from Last 1 Encounters:  04/26/17 6' (1.829 m)    Weight:   Wt Readings from Last 1 Encounters:  04/26/17 140 lb 3.4 oz (63.6 kg)    Ideal Body Weight:  80.9 kg  BMI:  Body mass index is 19.02 kg/m.  Estimated Nutritional Needs:   Kcal:  1445 kcals  Protein:  95-127 g  Fluid:  >/= 1.6 L  EDUCATION NEEDS:   No education needs identified at this time  [place], [place], LDN [phone] Pager  [phone] Weekend/On-Call Pager

## 2017-04-26 NOTE — Progress Notes (Signed)
[facility] Progress Note Patient Name: [name]. DOB: 03-Sep-1941 MRN: [id number]   Date of Service  04/26/2017  HPI/Events of Note  K+ = 2.8, Mg++ = 1.5 and Creatinine = 1.19.  eICU Interventions  Will replace K+ and Mg++.     Intervention Category Major Interventions: Electrolyte abnormality - evaluation and management  Sommer,Steven Eugene 04/26/2017, 4:56 AM

## 2017-04-26 NOTE — Progress Notes (Signed)
PULMONARY / CRITICAL CARE MEDICINE   Name: [name], [name]    ADMISSION DATE:  04/25/2017 CONSULTATION DATE:  04/25/17  REFERRING MD:  Thomasene Lot  CHIEF COMPLAINT:  AMS  HISTORY OF PRESENT ILLNESS:  Pt is encephelopathic; therefore, this HPI is obtained from chart review. [name]. is a 76 y.o. M w/ h/o but not limited to CAD, stroke with residual left-sided weakness, right PCA infarct with possible left-sided visual field cuts, DM, dementia.  He presented [facility] on 7/25 with AMS and as a code stroke. Apparently he had left-sided deficits per AMS. One day prior, he had a hypoglycemic event and required treatment by EMS; however, he was not transferred to hospital.  Upon arrival to ED, he had a focal seizure. Neurology evaluated him at bedside and obtain CT of the head. Immediately following CT, he had left facial twitching for which he received Ativan followed by Keppra as well as Dilantin. He developed  Complete heart block and associated shock. Subsequently intubated for airway protection. Prior to intubation, there was concern for aspiration event.  He was evaluated by cardiology for possible pacemaker needs.   SUBJECTIVE:   Unresponsive. Ve excellent on PSV of 10 but MS preventing extubation Still on levophed gtt.   VITAL SIGNS: BP 107/65   Pulse (!) 110   Temp (!) 96.1 F (35.6 C) (Axillary)   Resp 17   Ht 6' (1.829 m)   Wt 140 lb 3.4 oz (63.6 kg)   SpO2 100%   BMI 19.02 kg/m   HEMODYNAMICS:    VENTILATOR SETTINGS: Vent Mode: CPAP;PSV FiO2 (%):  [40 %-100 %] 40 % Set Rate:  [16 bmp] 16 bmp Vt Set:  [500 mL] 500 mL PEEP:  [5 cmH20] 5 cmH20 Pressure Support:  [10 cmH20] 10 cmH20 Plateau Pressure:  [11 cmH20-16 cmH20] 12 cmH20  INTAKE / OUTPUT: I/O last 3 completed shifts: In: 202.6 [I.V.:202.6] Out: 100 [Urine:100]  PHYSICAL EXAMINATION: Physical Exam  Constitutional: He appears well-nourished. He is intubated.  HENT:  Head:  Normocephalic and atraumatic.  Mouth/Throat: Oropharynx is clear and moist.  Eyes: Pupils are equal, round, and reactive to light. Conjunctivae and EOM are normal.  Cardiovascular: Normal heart sounds and normal pulses.  An irregularly irregular rhythm present. Tachycardia present.   Pulmonary/Chest: No accessory muscle usage. He is intubated. No respiratory distress. He has rhonchi.  Abdominal: Soft. Normal appearance and bowel sounds are normal. There is no tenderness.  Musculoskeletal:       Arms: Neurological: He is unresponsive. GCS eye subscore is 1. GCS verbal subscore is 1. GCS motor subscore is 1.  Skin: Skin is warm, dry and intact.   LABS:  BMET  Recent Labs Lab 04/25/17 1634 04/25/17 1643 04/26/17 0227  NA 137 138 138  K 2.5* 2.6* 2.8*  CL 101 99* 104  CO2 25  --  27  BUN 12 16 12   CREATININE 1.35* 1.20 1.19  GLUCOSE 197* 201* 349*    Electrolytes  Recent Labs Lab 04/25/17 1634 04/26/17 0227  CALCIUM 8.2* 7.4*  MG 1.7 1.5*  PHOS 3.1 3.3    CBC  Recent Labs Lab 04/25/17 1634 04/25/17 1643 04/26/17 0227  WBC 9.0  --  10.5  HGB 10.8* 12.6* 8.6*  HCT 35.2* 37.0* Ramsey.0*  PLT 257  --  224    Coag's  Recent Labs Lab 04/25/17 1634  APTT 26  INR 1.20    Sepsis Markers No results for input(s): LATICACIDVEN,  PROCALCITON, O2SATVEN in the last 168 hours.  ABG  Recent Labs Lab 04/25/17 1827  PHART 7.367  PCO2ART 52.5*  PO2ART 321.0*    Liver Enzymes  Recent Labs Lab 04/25/17 1634  AST 83*  ALT 37  ALKPHOS 118  BILITOT 0.8  ALBUMIN 2.1*    Cardiac Enzymes No results for input(s): TROPONINI, PROBNP in the last 168 hours.  Glucose  Recent Labs Lab 04/26/17 0006 04/26/17 0137 04/26/17 0354 04/26/17 0759 04/26/17 0851  GLUCAP 320* 327* 281* 50* 103*    Imaging Dg Chest Port 1 View  Result Date: 04/26/2017 CLINICAL DATA:  Endotracheal tube placement. EXAM: PORTABLE CHEST 1 VIEW COMPARISON:  Radiograph April 25, 2017.  FINDINGS: Stable cardiomediastinal silhouette. Atherosclerosis of thoracic aorta is noted. Endotracheal tube is unchanged in position. There is interval placement of nasogastric tube which is seen entering stomach. No pneumothorax is noted. Right lung is clear. Stable left basilar opacity is noted concerning for atelectasis or infiltrate with associated pleural effusion. Bony thorax is unremarkable. IMPRESSION: Aortic atherosclerosis. Endotracheal tube in grossly good position. Interval placement of nasogastric tube. Stable left basilar opacity concerning for atelectasis or infiltrate with associated pleural effusion. Electronically Signed   By: Marijo Conception, M.D.   On: 04/26/2017 07:21   Dg Chest Portable 1 View  Result Date: 04/25/2017 CLINICAL DATA:  Intubation. EXAM: PORTABLE CHEST 1 VIEW COMPARISON:  05/05/2016 and prior exams FINDINGS: An endotracheal tube is identified with tip 2.5 cm above the carina. Cardiomegaly and bibasilar atelectasis noted. Mild fullness of the right hilar region is probably technical. Defibrillator pads overlying the left chest are present. There is no evidence of pneumothorax or definite pleural effusion. IMPRESSION: Endotracheal tube tip 2.5 cm above the carina. Bibasilar atelectasis. Electronically Signed   By: Margarette Canada M.D.   On: 04/25/2017 18:53   Dg Abd Portable 1 View  Result Date: 04/26/2017 CLINICAL DATA:  Orogastric tube placement EXAM: PORTABLE ABDOMEN - 1 VIEW COMPARISON:  None. FINDINGS: The orogastric tube extends into the stomach with tip at the expected location of the distal gastric body. No other significant interval change. Numerous gallbladder calculi incidentally noted. IMPRESSION: Orogastric tube extends into the stomach. Electronically Signed   By: Andreas Newport M.D.   On: 04/26/2017 00:07   Ct Head Code Stroke W/o Cm  Result Date: 04/25/2017 CLINICAL DATA:  Code stroke. New onset left facial droop and left arm weakness. Drooling EXAM: CT  HEAD WITHOUT CONTRAST TECHNIQUE: Contiguous axial images were obtained from the base of the skull through the vertex without intravenous contrast. COMPARISON:  CT head 05/05/2016 FINDINGS: Brain: Moderate atrophy. Chronic right PCA infarct involving the right thalamus and right occipital lobe. Chronic microvascular ischemia in the white matter. Negative for acute infarct, hemorrhage, or mass. Vascular: Negative for hyperdense vessel Skull: Negative Sinuses/Orbits: Mucosal edema paranasal sinuses most notably in the left frontal sinus. Bilateral cataract removal. Other: None ASPECTS ([place] Stroke Program Early CT Score) - Ganglionic level infarction (caudate, lentiform nuclei, internal capsule, insula, M1-M3 cortex): 7 - Supraganglionic infarction (M4-M6 cortex): 3 Total score (0-10 with 10 being normal): 10 IMPRESSION: 1. Atrophy and chronic ischemic change.  No acute abnormality. 2. ASPECTS is 10 These results were called by telephone at the time of interpretation on 04/25/2017 at 4:59 pm to Dr. Rory Percy, who verbally acknowledged these results. Electronically Signed   By: Franchot Gallo M.D.   On: 04/25/2017 17:00     STUDIES:  CT head 7/25 > atrophy and chronic  ischemic changes, no acute abnormality. CXR 7/25 > bibasilar atelectasis. EEG 7/25:>>>  CULTURES: None.  ANTIBIOTICS: None.  SIGNIFICANT EVENTS: 7/25 > admitted.  LINES/TUBES: ETT 7/25 >   DISCUSSION: 76 y.o M admitted 7/25 with AMS and seizures. Received Ativan, Keppra, Dilantin. After receiving Dilantin, had postictal state followed by third-degree heart block and near arrest. He was subsequently intubated and PCCM was asked to admit. MS still poor and won't support extubation -has several electrolyte issues we are addressing -CXR improved some -start tubefeeds -has some RVR today, hope that changing him to neo for this should help.  -cont supportive care -needs MRI   ASSESSMENT / PLAN:  PULMONARY A: VDRF - due to  inability to protect airway in setting of seizures and AMS. Possible aspiration during intubation. Tobacco dependence. pcxr personally reviewed showing improved aeration on the right. Still has bibasilar airspace disease which could represent atx vs infiltrate.  No fever or leukocytosis  abg reviewed MS will not support extubation as of yet ? Post-itcal ? Subclinical seizure?  P:   Cont PSV VAP protocol CXR in am   CARDIOVASCULAR A:  CHB - following administration of Dilantin. Cardiology called by EDP. Shock - cardiogenic, improved but still on pressors Hx HTN, CAD, LVH, MI. Atrial fib w/ intermittent RVR P:  Cont tele  Holding norvasc  Change pressor to neo from levophed Will probably need central access to support pressor needs. Would also help better assess etiology of hypotension    RENAL A:   AKI Hypokalemia & hypomagnesemia   Scr improved.  K still low got 60 meq, this won't be enough  P:   Change rest of K to VT, give 80 meq VT (40 q 4) Replace Mg Cont strict I&O Am chemistry  Avoid hypotension  GASTROINTESTINAL A:   Nutrition. GI prophylaxis. Hx pancreatitis. P:   Cont ppi Start tubefeeds  HEMATOLOGIC A:   Anemia (at baseline) P:  Trend cbc Cont [place] heparin   INFECTIOUS A:   Likely aspiration event  P:   unasyn started 7/25 Ck PCT, if neg consider dc abx   ENDOCRINE A:   Hx DM w/ hyperglycemia   P:   cbgs and ssi  Add aspart   NEUROLOGIC A:   Acute encephalopathy secondary to post ictal state and possible hypoperfusion injury Seizures - no history of this ? CVA  P:   RAS goal 0 to -1 F/u EEG Cont Keppra  Wean versed to off.  Seizure precautions  MRI brain at some point    FAMILY  - Updates: Daughter updated in full.  - Inter-disciplinary family meet or Palliative Care meeting due by:  7/31  My cct 39 min Erick Colace ACNP-BC [place] Pager # [phone] OR # [phone] if no answer  04/26/2017  9:57 AM  Attending Note:  76 year old male with history of seizure disorder presenting to the hospital post ictal and in respiratory failure.  Intubated on exam with clear lungs.  I reviewed CXR myself, ETT in good position.  Discussed with PCCM-NP.  Will continue PS trials with no extubation given mental status.  Continue Keppra 500 mg IV BID.  Continue low dose versed and taper off today if seizure allows.  Seizure precautions.  Begin TF per nutrition.  Neuro checks.  Will need MRI when stable.  The patient is critically ill with multiple organ systems failure and requires high complexity decision making for assessment and support, frequent evaluation and titration of  therapies, application of advanced monitoring technologies and extensive interpretation of multiple databases.   Critical Care Time devoted to patient care services described in this note is  35  Minutes. This time reflects time of care of this signee Dr Jennet Maduro. This critical care time does not reflect procedure time, or teaching time or supervisory time of PA/NP/Med student/Med Resident etc but could involve care discussion time.  Rush Farmer, M.D. [facility] Pulmonary/Critical Care Medicine. Pager: [phone]. After hours pager: [phone].

## 2017-04-26 NOTE — Plan of Care (Signed)
Problem: Nutritional: Goal: Intake of prescribed amount of daily calories will improve Tube feeds started 7/26

## 2017-04-26 NOTE — Progress Notes (Signed)
[facility] Progress Note Patient Name: [name]. DOB: 02-22-1941 MRN: [id number]   Date of Service  04/26/2017  HPI/Events of Note  Notified of oliguria. Foley catheter in place. Patient off pressor as of approximately 2 PM today. Currently on FiO2 0.4 via ventilator.   eICU Interventions  1. Ordering LR 500 mL bolus over one hour 2. Continuing to monitor urine output with Foley catheter      Intervention Category Intermediate Interventions: Oliguria - evaluation and management  Tera Partridge 04/26/2017, 9:33 PM

## 2017-04-26 NOTE — Progress Notes (Signed)
SLP Cancellation Note  Patient Details Name: [name]. MRN: [id number] DOB: [birth year]   Cancelled treatment:       Reason Eval/Treat Not Completed: Patient not medically ready. Pt intubated after orders placed. Will sign off and await new orders when appropriate.    Rafael Salway, Katherene Ponto 04/26/2017, 8:57 AM

## 2017-04-27 ENCOUNTER — Inpatient Hospital Stay (HOSPITAL_COMMUNITY): Payer: Medicare Other

## 2017-04-27 DIAGNOSIS — I4891 Unspecified atrial fibrillation: Secondary | ICD-10-CM

## 2017-04-27 LAB — BLOOD GAS, ARTERIAL
ACID-BASE EXCESS: 0.5 mmol/L (ref 0.0–2.0)
BICARBONATE: 24.8 mmol/L (ref 20.0–28.0)
DRAWN BY: 51147
FIO2: 40
LHR: 16 {breaths}/min
MECHVT: 500 mL
O2 SAT: 97.7 %
PEEP/CPAP: 5 cmH2O
PH ART: 7.397 (ref 7.350–7.450)
PO2 ART: 96.5 mmHg (ref 83.0–108.0)
Patient temperature: 98.6
pCO2 arterial: 41.2 mmHg (ref 32.0–48.0)

## 2017-04-27 LAB — GLUCOSE, CAPILLARY
GLUCOSE-CAPILLARY: 384 mg/dL — AB (ref 65–99)
GLUCOSE-CAPILLARY: 45 mg/dL — AB (ref 65–99)
GLUCOSE-CAPILLARY: 101 mg/dL — AB (ref 65–99)
Glucose-Capillary: 141 mg/dL — ABNORMAL HIGH (ref 65–99)
Glucose-Capillary: 228 mg/dL — ABNORMAL HIGH (ref 65–99)
Glucose-Capillary: 335 mg/dL — ABNORMAL HIGH (ref 65–99)
Glucose-Capillary: 376 mg/dL — ABNORMAL HIGH (ref 65–99)
Glucose-Capillary: 88 mg/dL (ref 65–99)

## 2017-04-27 LAB — BASIC METABOLIC PANEL
ANION GAP: 5 (ref 5–15)
BUN: 16 mg/dL (ref 6–20)
CHLORIDE: 108 mmol/L (ref 101–111)
CO2: 24 mmol/L (ref 22–32)
Calcium: 7.4 mg/dL — ABNORMAL LOW (ref 8.9–10.3)
Creatinine, Ser: 0.97 mg/dL (ref 0.61–1.24)
GFR calc Af Amer: >60 mL/min (ref >60)
GFR calc non Af Amer: >60 mL/min (ref >60)
GLUCOSE: 388 mg/dL — AB (ref 65–99)
POTASSIUM: 4 mmol/L (ref 3.5–5.1)
Sodium: 137 mmol/L (ref 135–145)

## 2017-04-27 LAB — MAGNESIUM
Magnesium: 2.2 mg/dL (ref 1.7–2.4)
Magnesium: 1.8 mg/dL (ref 1.7–2.4)

## 2017-04-27 LAB — PHOSPHORUS
Phosphorus: 1.8 mg/dL — ABNORMAL LOW (ref 2.5–4.6)
Phosphorus: 2.3 mg/dL — ABNORMAL LOW (ref 2.5–4.6)

## 2017-04-27 LAB — PROCALCITONIN: PROCALCITONIN: 0.53 ng/mL

## 2017-04-27 MED ORDER — LACTATED RINGERS IV SOLN
INTRAVENOUS | Status: DC
  Administered 2017-04-27 – 2017-05-02 (×5): via INTRAVENOUS

## 2017-04-27 MED ORDER — FREE WATER
200.0000 mL | Status: DC
  Administered 2017-04-27 – 2017-05-02 (×30): 200 mL

## 2017-04-27 MED ORDER — DEXTROSE 50 % IV SOLN
INTRAVENOUS | Status: AC
  Filled 2017-04-27: qty 50

## 2017-04-27 MED ORDER — INSULIN ASPART 100 UNIT/ML ~~LOC~~ SOLN
4.0000 [IU] | SUBCUTANEOUS | Status: DC
  Administered 2017-04-27: 4 [IU] via SUBCUTANEOUS

## 2017-04-27 MED ORDER — DILTIAZEM 12 MG/ML ORAL SUSPENSION
60.0000 mg | Freq: Four times a day (QID) | ORAL | Status: DC
  Administered 2017-04-27 – 2017-05-07 (×32): 60 mg
  Filled 2017-04-27 (×42): qty 6

## 2017-04-27 MED ORDER — PANTOPRAZOLE SODIUM 40 MG PO PACK
40.0000 mg | PACK | Freq: Every day | ORAL | Status: DC
  Administered 2017-04-27 – 2017-05-03 (×7): 40 mg
  Filled 2017-04-27 (×7): qty 20

## 2017-04-27 MED ORDER — INSULIN ASPART 100 UNIT/ML ~~LOC~~ SOLN
0.0000 [IU] | SUBCUTANEOUS | Status: DC
  Administered 2017-04-27: 15 [IU] via SUBCUTANEOUS
  Administered 2017-04-28 (×2): 3 [IU] via SUBCUTANEOUS
  Administered 2017-04-28 (×2): 7 [IU] via SUBCUTANEOUS
  Administered 2017-04-28: 3 [IU] via SUBCUTANEOUS
  Administered 2017-04-29 (×5): 4 [IU] via SUBCUTANEOUS
  Administered 2017-04-30: 7 [IU] via SUBCUTANEOUS
  Administered 2017-04-30 (×2): 3 [IU] via SUBCUTANEOUS
  Administered 2017-04-30 (×2): 4 [IU] via SUBCUTANEOUS
  Administered 2017-04-30: 3 [IU] via SUBCUTANEOUS
  Administered 2017-05-01 (×3): 4 [IU] via SUBCUTANEOUS
  Administered 2017-05-01: 3 [IU] via SUBCUTANEOUS
  Administered 2017-05-01 – 2017-05-02 (×3): 4 [IU] via SUBCUTANEOUS
  Administered 2017-05-02: 3 [IU] via SUBCUTANEOUS
  Administered 2017-05-02 (×2): 4 [IU] via SUBCUTANEOUS
  Administered 2017-05-02: 3 [IU] via SUBCUTANEOUS
  Administered 2017-05-02: 4 [IU] via SUBCUTANEOUS
  Administered 2017-05-03 (×4): 3 [IU] via SUBCUTANEOUS
  Administered 2017-05-03: 7 [IU] via SUBCUTANEOUS

## 2017-04-27 MED ORDER — SODIUM PHOSPHATES 45 MMOLE/15ML IV SOLN
30.0000 mmol | Freq: Once | INTRAVENOUS | Status: AC
  Administered 2017-04-27: 30 mmol via INTRAVENOUS
  Filled 2017-04-27: qty 10

## 2017-04-27 MED ORDER — DEXTROSE 50 % IV SOLN
25.0000 mL | Freq: Once | INTRAVENOUS | Status: AC
  Administered 2017-04-27: 25 mL via INTRAVENOUS

## 2017-04-27 NOTE — Care Management Note (Signed)
Case Management Note  Patient Details  Name: [name]. MRN: [id number] Date of Birth: 1941-04-26  Subjective/Objective:  Pt admitted on 04/25/17 with seizure activity and VDRF.  PTA, pt resided at home with son and daughter in law.                   Action/Plan: Pt currently remains intubated; will follow for discharge planning as pt progresses.    Expected Discharge Date:                  Expected Discharge Plan:     In-House Referral:     Discharge planning Services  CM Consult  Post Acute Care Choice:    Choice offered to:     DME Arranged:    DME Agency:     HH Arranged:    HH Agency:     Status of Service:  In process, will continue to follow  If discussed at Long Length of Stay Meetings, dates discussed:    Additional Comments:  Ella Bodo, RN 04/27/2017, 5:05 PM

## 2017-04-27 NOTE — Progress Notes (Signed)
[facility] Progress Note Patient Name: [name]. DOB: Oct 23, 1940 MRN: [id number]   Date of Service  04/27/2017  HPI/Events of Note  Hypophosphatemia  eICU Interventions  Phos replaced     Intervention Category Intermediate Interventions: Electrolyte abnormality - evaluation and management  DETERDING,ELIZABETH 04/27/2017, 6:45 AM

## 2017-04-27 NOTE — Progress Notes (Signed)
Notified [facility] of CBG 384. Orders to stop D5 1/2NS.  Patient on tube feeds currently. Will continue to monitor.

## 2017-04-27 NOTE — Progress Notes (Signed)
Neurology Progress note  S:// Patient seen and examined this morning. Continues to have depressed mental status.  O:// Vitals:   04/27/17 1000 04/27/17 1125  BP: 104/72 107/75  Pulse: (!) 127 (!) 129  Resp: 19 15  Temp:     Responds to voice  And nox stim by opening eyes. Pupils equal round reactive to light. No gaze deviation. Breathing over the vent Corneals positive Cough positive gag positive No response to noxious stim in terms of motor movements. Follows some commands by raising right arm and wiggling both toes. Left arm is contracted. Grimaces to nox stim all over without much withdrawal. 1+ DTRs  CBC    Component Value Date/Time   WBC 10.5 04/26/2017 0227   RBC 4.09 (L) 04/26/2017 0227   HGB 8.6 (L) 04/26/2017 0227   HCT 29.0 (L) 04/26/2017 0227   PLT 224 04/26/2017 0227   MCV 70.9 (L) 04/26/2017 0227   MCH 21.0 (L) 04/26/2017 0227   MCHC 29.7 (L) 04/26/2017 0227   RDW 22.3 (H) 04/26/2017 0227   LYMPHSABS 2.5 04/25/2017 1634   MONOABS 0.6 04/25/2017 1634   EOSABS 0.0 04/25/2017 1634   BASOSABS 0.0 04/25/2017 1634   CMP     Component Value Date/Time   NA 137 04/27/2017 0516   K 4.0 04/27/2017 0516   CL 108 04/27/2017 0516   CO2 24 04/27/2017 0516   GLUCOSE 388 (H) 04/27/2017 0516   BUN 16 04/27/2017 0516   CREATININE 0.97 04/27/2017 0516   CREATININE 0.88 12/18/2011 1423   CALCIUM 7.4 (L) 04/27/2017 0516   PROT 7.4 04/25/2017 1634   ALBUMIN 2.1 (L) 04/25/2017 1634   AST 83 (H) 04/25/2017 1634   ALT 37 04/25/2017 1634   ALKPHOS 118 04/25/2017 1634   BILITOT 0.8 04/25/2017 1634   GFRNONAA >60 04/27/2017 0516   GFRNONAA 86 12/18/2011 1423   GFRAA >60 04/27/2017 0516   GFRAA >89 12/18/2011 1423   Meds  Current Facility-Administered Medications:  .  0.9 %  sodium chloride infusion, 250 mL, Intravenous, PRN, Omar Person, NP .  chlorhexidine gluconate (MEDLINE KIT) (PERIDEX) 0.12 % solution 15 mL, 15 mL, Mouth Rinse, BID, Charlesetta Garibaldi,  MD, 15 mL at 04/27/17 0759 .  diltiazem (CARDIZEM) 10 mg/ml oral suspension 60 mg, 60 mg, Per Tube, Q6H, Salvadore Dom E, NP .  feeding supplement (VITAL HIGH PROTEIN) liquid 1,000 mL, 1,000 mL, Per Tube, Continuous, Rush Farmer, MD, Last Rate: 60 mL/hr at 04/27/17 0600, 1,000 mL at 04/27/17 0600 .  fentaNYL (SUBLIMAZE) injection 50 mcg, 50 mcg, Intravenous, Q2H PRN, Omar Person, NP, 50 mcg at 04/27/17 0025 .  free water 200 mL, 200 mL, Per Tube, Q4H, Salvadore Dom E, NP, 200 mL at 04/27/17 1148 .  heparin injection 5,000 Units, 5,000 Units, Subcutaneous, Q8H, Omar Person, NP, 5,000 Units at 04/27/17 0554 .  insulin aspart (novoLOG) injection 0-20 Units, 0-20 Units, Subcutaneous, Q4H, Babcock, Peter E, NP .  insulin aspart (novoLOG) injection 4 Units, 4 Units, Subcutaneous, Q4H, Erick Colace, NP .  lactated ringers infusion, , Intravenous, Continuous, Erick Colace, NP .  levETIRAcetam (KEPPRA) 500 mg in sodium chloride 0.9 % 100 mL IVPB, 500 mg, Intravenous, Q12H, Corey Harold, NP, Stopped at 04/27/17 [phone] .  MEDLINE mouth rinse, 15 mL, Mouth Rinse, 10 times per day, Charlesetta Garibaldi, MD, 15 mL at 04/27/17 0917 .  pantoprazole sodium (PROTONIX) 40 mg/20 mL oral suspension 40 mg, 40 mg, Per  Tube, Q1200, Erick Colace, NP .  phenylephrine (NEO-SYNEPHRINE) 10 mg in sodium chloride 0.9 % 250 mL (0.04 mg/mL) infusion, 0-400 mcg/min, Intravenous, Titrated, Erick Colace, NP, Stopped at 04/26/17 1440 .  sodium phosphate 30 mmol in dextrose 5 % 250 mL infusion, 30 mmol, Intravenous, Once, Deterding, Guadelupe Sabin, MD, Last Rate: 43 mL/hr at 04/27/17 0759, 30 mmol at 04/27/17 0759  Imaging MRI of the brain shows advanced atrophy of the hippocampus, moderate generalized atrophy. Suggestive of possible underlying severe dementia. EEG 7/25 - Moderate generalized cerebral dysfunction because of slowing. No epileptiform features seen.  Assessment 76 year old man past  history of stroke with left-sided deficits, dementia presented in A. Fib with RVR with active witnessed complex partial seizure that continued to become what looked like epilepsia partialis continua. He was given benzodiazepine, IV Keppra followed by IV Dilantin. Following administration of IV Dilantin, he went into complete heart block. He was right resuscitated and intubated and admitted to the ICU. His mental status has been very slowly improving. He continues to be encephalopathic.  Impression Seizures Epilepsia partialis continua Toxic metabolic encephalopathy  Recs -Continue with Keppra 500 twice a day -When necessary benzos for clinical seizures -correction of toxic metabolic derangements perprimary team. -We will repeat a spot EEG.Today  Amie Portland, MD [facility] [phone]  If 7pm to 7am, please call on call as listed on AMION.

## 2017-04-27 NOTE — Procedures (Signed)
Date of recording 04/27/2017  Referring physician Amie Portland  Reason for the study 76 year old man past history of stroke with left-sided deficits, dementia presented with A. Fib with RVR with witnessed complex partial seizure. Currently patient is intubated on ventilatory support  Technical  This is a multichannel digital EEG recording using a 10-20 electrode system.  Description of recording The awake posterior dominant rhythm varies between 3-7 Hz symmetrical and reactive. EEG comprises of generalized delta and theta slowing without any focal component Epileptiform activity was not seen during this recording. Sleep architecture was not observed.  Impression The EEG is abnormal and findings are suggestive of Moderate Generalized cerebral dysfunction. Epileptiform activity was not seen during this recording

## 2017-04-27 NOTE — Progress Notes (Signed)
Inpatient Diabetes Program Recommendations  AACE/ADA: New Consensus Statement on Inpatient Glycemic Control (2015)  Target Ranges:  Prepandial:   less than 140 mg/dL      Peak postprandial:   less than 180 mg/dL (1-2 hours)      Critically ill patients:  140 - 180 mg/dL   Results for [name], [name] (MRN [id number]) as of 04/27/2017 10:07  Ref. Range 04/26/2017 08:51 04/26/2017 11:27 04/26/2017 12:01 04/26/2017 12:40 04/26/2017 14:14 04/26/2017 16:32 04/26/2017 16:48 04/26/2017 19:45 04/27/2017 00:10 04/27/2017 04:09 04/27/2017 07:20  Glucose-Capillary Latest Ref Range: 65 - 99 mg/dL 103 (H) 18 (LL) 60 (L) 91 81 54 (L) 115 (H) 102 (H) 228 (H) 384 (H) 376 (H)   Review of Glycemic Control  Diabetes history: DM2 Outpatient Diabetes medications: Lantus 20 units BID Current orders for Inpatient glycemic control: CBGs Q4H  Inpatient Diabetes Program Recommendations: Insulin: Noted hypoglycemia on 04/26/17 and Novolog correction was discontinued. CBGs 228-384 mg/dl over the past 10 hours. Please order ICU Glycemic Control order set.  Thanks, Barnie Alderman, RN, MSN, CDE Diabetes Coordinator Inpatient Diabetes Program [phone] (Team Pager from 8am to 5pm)

## 2017-04-27 NOTE — Progress Notes (Signed)
EEG Completed; Results Pending  

## 2017-04-27 NOTE — Progress Notes (Signed)
PULMONARY / CRITICAL CARE MEDICINE   Name: [name]. MRN: [id number] DOB: March 31, 1941    ADMISSION DATE:  04/25/2017 CONSULTATION DATE:  04/25/17  REFERRING MD:  Thomasene Lot  CHIEF COMPLAINT:  AMS  HISTORY OF PRESENT ILLNESS:  Pt is encephelopathic; therefore, this HPI is obtained from chart review. [name]. is a 76 y.o. M w/ h/o but not limited to CAD, stroke with residual left-sided weakness, right PCA infarct with possible left-sided visual field cuts, DM, dementia.  He presented [facility] on 7/25 with AMS and as a code stroke. Apparently he had left-sided deficits per AMS. One day prior, he had a hypoglycemic event and required treatment by EMS; however, he was not transferred to hospital.  Upon arrival to ED, he had a focal seizure. Neurology evaluated him at bedside and obtain CT of the head. Immediately following CT, he had left facial twitching for which he received Ativan followed by Keppra as well as Dilantin. He developed  Complete heart block and associated shock. Subsequently intubated for airway protection. Prior to intubation, there was concern for aspiration event.  He was evaluated by cardiology for possible pacemaker needs.   SUBJECTIVE:   Off pressors More awake and f/c Still a little sleepy to come off vent   VITAL SIGNS: BP 97/70 (BP Location: Right Arm)   Pulse (!) 128   Temp 98.5 F (36.9 C) (Oral)   Resp 12   Ht 6' (1.829 m)   Wt 142 lb 10.2 oz (64.7 kg)   SpO2 100%   BMI 19.35 kg/m   HEMODYNAMICS:    VENTILATOR SETTINGS: Vent Mode: CPAP;PSV FiO2 (%):  [40 %] 40 % Set Rate:  [16 bmp] 16 bmp Vt Set:  [500 mL] 500 mL PEEP:  [5 cmH20] 5 cmH20 Pressure Support:  [10 cmH20] 10 cmH20 Plateau Pressure:  [12 cmH20-13 cmH20] 12 cmH20  INTAKE / OUTPUT: I/O last 3 completed shifts: In: 2758.4 [I.V.:1136.4; NG/GT:862; IV Piggyback:760] Out: 890 [Urine:890]  PHYSICAL EXAMINATION: Physical Exam  Constitutional: He appears well-nourished. He appears  lethargic. No distress. He is intubated.  HENT:  Head: Normocephalic and atraumatic.  Eyes: Pupils are equal, round, and reactive to light. Conjunctivae and EOM are normal.  Neck: Normal range of motion. Neck supple. No thyromegaly present.  Cardiovascular: Normal pulses.  An irregularly irregular rhythm present.  Pulmonary/Chest: Effort normal. He is intubated. No respiratory distress. He has decreased breath sounds.  Abdominal: Soft. Normal appearance and bowel sounds are normal.  Musculoskeletal:  Left arm contracted  Neurological: He appears lethargic. GCS eye subscore is 3. GCS verbal subscore is 1. GCS motor subscore is 5.  Left arm weak (baseline)  Skin: Skin is warm, dry and intact. He is not diaphoretic.   LABS:  BMET  Recent Labs Lab 04/25/17 1634 04/25/17 1643 04/26/17 0227 04/27/17 0516  NA 137 138 138 137  K 2.5* 2.6* 2.8* 4.0  CL 101 99* 104 108  CO2 25  --  27 24  BUN 12 16 12 16   CREATININE 1.35* 1.20 1.19 0.97  GLUCOSE 197* 201* 349* 388*    Electrolytes  Recent Labs Lab 04/25/17 1634 04/26/17 0227 04/26/17 1025 04/26/17 1840 04/27/17 0516  CALCIUM 8.2* 7.4*  --   --  7.4*  MG 1.7 1.5* 1.9 2.4 2.2  PHOS 3.1 3.3 3.0 2.3* 1.8*    CBC  Recent Labs Lab 04/25/17 1634 04/25/17 1643 04/26/17 0227  WBC 9.0  --  10.5  HGB 10.8* 12.6* 8.6*  HCT 35.2* 37.0* 29.0*  PLT 257  --  224    Coag's  Recent Labs Lab 04/25/17 1634  APTT 26  INR 1.20    Sepsis Markers  Recent Labs Lab 04/26/17 1025 04/27/17 0516  PROCALCITON 0.50 0.53    ABG  Recent Labs Lab 04/25/17 1827 04/27/17 0316  PHART 7.367 7.397  PCO2ART 52.5* 41.2  PO2ART 321.0* 96.5    Liver Enzymes  Recent Labs Lab 04/25/17 1634  AST 83*  ALT 37  ALKPHOS 118  BILITOT 0.8  ALBUMIN 2.1*    Cardiac Enzymes No results for input(s): TROPONINI, PROBNP in the last 168 hours.  Glucose  Recent Labs Lab 04/26/17 1632 04/26/17 1648 04/26/17 1945 04/27/17 0010  04/27/17 0409 04/27/17 0720  GLUCAP 54* 115* 102* 228* 384* 376*    Imaging Mr Brain Wo Contrast  Result Date: 04/26/2017 CLINICAL DATA:  Seizure, dementia EXAM: MRI HEAD WITHOUT CONTRAST TECHNIQUE: Multiplanar, multiecho pulse sequences of the brain and surrounding structures were obtained without intravenous contrast. COMPARISON:  CT head 04/25/2017 FINDINGS: Brain: Moderate atrophy with ventricular enlargement. Prominent atrophy of the hippocampus bilaterally. Chronic right occipital infarct. Chronic microvascular ischemic change in the white matter and basal ganglia bilaterally. Negative for acute infarct Multiple areas of chronic microhemorrhage including the left cerebellum, brainstem, thalamus, and cerebral hemispheres. No fluid collection or mass. Vascular: Normal arterial flow voids Skull and upper cervical spine: Negative new Sinuses/Orbits: Paranasal sinuses demonstrate mild mucosal edema. Bilateral cataract removal. Other: None IMPRESSION: Moderate atrophy. 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 Extensive chronic ischemic change as above. No acute infarct. Numerous areas of chronic microhemorrhage the brain likely related to hypertension. Electronically Signed   By: Franchot Gallo M.D.   On: 04/26/2017 15:57     STUDIES:  CT head 7/25 > atrophy and chronic ischemic changes, no acute abnormality. CXR 7/25 > bibasilar atelectasis. EEG 7/25:>>>neg for seizures   CULTURES: None.  ANTIBIOTICS: None.  SIGNIFICANT EVENTS: 7/25 > admitted.  LINES/TUBES: ETT 7/25 >   DISCUSSION: 76 y.o M admitted 7/25 with AMS and seizures. Received Ativan, Keppra, Dilantin. After receiving Dilantin, had postictal state followed by third-degree heart block and near arrest. He was subsequently intubated and PCCM was asked to admit. MS still poor and won't support extubation but waking up some -cont supportive care and give time  -adding  low dose ccb and fluid challenge give PAF    ASSESSMENT / PLAN:  PULMONARY A: VDRF - due to inability to protect airway in setting of seizures and AMS. Possible aspiration during intubation  Tobacco dependence. Excellent Vt on PSV Not ready for extubation given MS  P:   Cont PSV w/ daily assessment for extubation  Limit sedating meds VAP protocol   CARDIOVASCULAR A:  CHB - following administration of Dilantin. Cardiology called by EDP. Shock - cardiogenic, improved but still on pressors Hx HTN, CAD, LVH, MI. Atrial fib w/ intermittent RVR P:  LR challenge Add low dose CCB Cont tele   RENAL A:   Hypophosphatemia Resolved AKI  P:   Replace PO4 Repeat am chemistries Fluid challenge  GASTROINTESTINAL A:   Nutrition. GI prophylaxis. Hx pancreatitis. Protein calorie malnutrition associated w/ critical illness P:   Cont ppi and tubefeeds  HEMATOLOGIC A:   Anemia (at baseline) P:  Trend cbc Transfuse per protocol  [place] heparin   INFECTIOUS A:   Likely aspiration event PCT negative, no abx. No sig fever  P:   Trend fever and wbc curve Repeat CXR in am   ENDOCRINE A:   Hx DM w/ hyperglycemia  P:   cbgs and add back ssi Will hold off on tubefeed coverage given hypoglycemia 7/26   NEUROLOGIC A:   Acute encephalopathy secondary to post ictal state and possible hypoperfusion injury Seizures - no history of this ? CVA No seizures on EEG. Generalized slowing  More awake today following commands  P:   RAS goal 0 Cont keppra Supportive care Seizure precautions  RAS goal 0 to -1   FAMILY  - Updates: Daughter updated in full.  - Inter-disciplinary family meet or Palliative Care meeting due by:  7/31  My cct 64 min  Erick Colace ACNP-BC [place] Pager # [phone] OR # [phone] if no answer  04/27/2017 9:54 AM  Attending Note:  76 year old male with history of seizure disorder presenting in status epilepticus with a  postictal state that required intubation.  Patient is slightly more awake on exam with clear lungs but not enough to consider extubation.  Episodes of PAF with rapid response.  Will bolus with IVF to optimize volume.  Cardizem as ordered.  Hold weaning for now.  Will continue seizure control as ordered.  Monitor clinically in the ICU.  PCCM wil continue to follow.  The patient is critically ill with multiple organ systems failure and requires high complexity decision making for assessment and support, frequent evaluation and titration of therapies, application of advanced monitoring technologies and extensive interpretation of multiple databases.   Critical Care Time devoted to patient care services described in this note is  35  Minutes. This time reflects time of care of this signee Dr Jennet Maduro. This critical care time does not reflect procedure time, or teaching time or supervisory time of PA/NP/Med student/Med Resident etc but could involve care discussion time.  Rush Farmer, M.D. [facility] Pulmonary/Critical Care Medicine. Pager: [phone]. After hours pager: [phone].

## 2017-04-28 ENCOUNTER — Inpatient Hospital Stay (HOSPITAL_COMMUNITY): Payer: Medicare Other

## 2017-04-28 DIAGNOSIS — J9601 Acute respiratory failure with hypoxia: Secondary | ICD-10-CM

## 2017-04-28 DIAGNOSIS — R569 Unspecified convulsions: Secondary | ICD-10-CM

## 2017-04-28 DIAGNOSIS — J9811 Atelectasis: Secondary | ICD-10-CM

## 2017-04-28 DIAGNOSIS — G9340 Encephalopathy, unspecified: Secondary | ICD-10-CM

## 2017-04-28 LAB — CBC
HCT: 26.4 % — ABNORMAL LOW (ref 39.0–52.0)
HEMATOCRIT: 26 % — AB (ref 39.0–52.0)
HEMOGLOBIN: 7.8 g/dL — AB (ref 13.0–17.0)
Hemoglobin: 7.8 g/dL — ABNORMAL LOW (ref 13.0–17.0)
MCH: 21.1 pg — AB (ref 26.0–34.0)
MCH: 21.6 pg — AB (ref 26.0–34.0)
MCHC: 29.5 g/dL — ABNORMAL LOW (ref 30.0–36.0)
MCHC: 30 g/dL (ref 30.0–36.0)
MCV: 71.4 fL — AB (ref 78.0–100.0)
MCV: 72 fL — AB (ref 78.0–100.0)
Platelets: 191 10*3/uL (ref 150–400)
Platelets: 197 10*3/uL (ref 150–400)
RBC: 3.61 MIL/uL — ABNORMAL LOW (ref 4.22–5.81)
RBC: 3.7 MIL/uL — AB (ref 4.22–5.81)
RDW: 22.6 % — AB (ref 11.5–15.5)
RDW: 22.6 % — ABNORMAL HIGH (ref 11.5–15.5)
WBC: 10.5 10*3/uL (ref 4.0–10.5)
WBC: 10.7 10*3/uL — ABNORMAL HIGH (ref 4.0–10.5)

## 2017-04-28 LAB — GLUCOSE, CAPILLARY
GLUCOSE-CAPILLARY: 93 mg/dL (ref 65–99)
GLUCOSE-CAPILLARY: 213 mg/dL — AB (ref 65–99)
GLUCOSE-CAPILLARY: 202 mg/dL — AB (ref 65–99)
Glucose-Capillary: 101 mg/dL — ABNORMAL HIGH (ref 65–99)
Glucose-Capillary: 131 mg/dL — ABNORMAL HIGH (ref 65–99)
Glucose-Capillary: 139 mg/dL — ABNORMAL HIGH (ref 65–99)

## 2017-04-28 LAB — BASIC METABOLIC PANEL
Anion gap: 5 (ref 5–15)
BUN: 14 mg/dL (ref 6–20)
CHLORIDE: 104 mmol/L (ref 101–111)
CO2: 27 mmol/L (ref 22–32)
CREATININE: 0.86 mg/dL (ref 0.61–1.24)
Calcium: 7.5 mg/dL — ABNORMAL LOW (ref 8.9–10.3)
GFR calc Af Amer: >60 mL/min (ref >60)
GFR calc non Af Amer: >60 mL/min (ref >60)
GLUCOSE: 132 mg/dL — AB (ref 65–99)
POTASSIUM: 2.8 mmol/L — AB (ref 3.5–5.1)
SODIUM: 136 mmol/L (ref 135–145)

## 2017-04-28 LAB — PROCALCITONIN: Procalcitonin: 0.35 ng/mL

## 2017-04-28 MED ORDER — FUROSEMIDE 10 MG/ML IJ SOLN
40.0000 mg | Freq: Four times a day (QID) | INTRAMUSCULAR | Status: AC
  Administered 2017-04-28 (×3): 40 mg via INTRAVENOUS
  Filled 2017-04-28 (×3): qty 4

## 2017-04-28 MED ORDER — POTASSIUM CHLORIDE 20 MEQ/15ML (10%) PO SOLN
40.0000 meq | Freq: Three times a day (TID) | ORAL | Status: AC
  Administered 2017-04-28 (×2): 40 meq
  Filled 2017-04-28 (×2): qty 30

## 2017-04-28 MED ORDER — POTASSIUM CHLORIDE 20 MEQ/15ML (10%) PO SOLN
40.0000 meq | ORAL | Status: AC
  Administered 2017-04-28 (×2): 40 meq
  Filled 2017-04-28 (×2): qty 30

## 2017-04-28 NOTE — Progress Notes (Signed)
[facility] Progress Note Patient Name: [name]. DOB: 1941-07-21 MRN: [id number]   Date of Service  04/28/2017  HPI/Events of Note  Hypokalemia  eICU Interventions  Potassium replaced     Intervention Category Intermediate Interventions: Electrolyte abnormality - evaluation and management  [name] 04/28/2017, 4:52 AM

## 2017-04-28 NOTE — Progress Notes (Signed)
Neurology progress note  Subjective Patient seen and examined this morning. No overnight events. No seizures reported.  Objective Vitals:   04/28/17 0500 04/28/17 0600  BP: (!) 147/100 (!) 98/59  Pulse: (!) 135 75  Resp: (!) 30 (!) 6  Temp:    Gen.: Intubated, in no acute distress in his bed. HEENT: Normocephalic, atraumatic, clear nares CVS: S1-S2+, RRR Respiratory: No rales Abdomen: Nondistended nontender Extremities: Left arm appears a little swollen. Neurological exam Sleepy, opens eyes to voice. Follows commands slowly. Cranial nerves: Pupils equal round reactive to light, extra ocular movements intact with no gaze preference or deviation, facial symmetry difficult to ascertain with the endotracheal tube in place.  motor exam: Right upper and right lower extremity antigravity. Left upper extremity minimal movement. Left lower extremity antigravity. Sensory exam: Grimaces to pain all over. Could not test coordination or gait.  Labs CBC    Component Value Date/Time   WBC 10.7 (H) 04/28/2017 0241   WBC 10.5 04/28/2017 0241   RBC 3.70 (L) 04/28/2017 0241   RBC 3.61 (L) 04/28/2017 0241   HGB 7.8 (L) 04/28/2017 0241   HGB 7.8 (L) 04/28/2017 0241   HCT 26.4 (L) 04/28/2017 0241   HCT 26.0 (L) 04/28/2017 0241   PLT 191 04/28/2017 0241   PLT 197 04/28/2017 0241   MCV 71.4 (L) 04/28/2017 0241   MCV 72.0 (L) 04/28/2017 0241   MCH 21.1 (L) 04/28/2017 0241   MCH 21.6 (L) 04/28/2017 0241   MCHC 29.5 (L) 04/28/2017 0241   MCHC 30.0 04/28/2017 0241   RDW 22.6 (H) 04/28/2017 0241   RDW 22.6 (H) 04/28/2017 0241   LYMPHSABS 2.5 04/25/2017 1634   MONOABS 0.6 04/25/2017 1634   EOSABS 0.0 04/25/2017 1634   BASOSABS 0.0 04/25/2017 1634   CMP     Component Value Date/Time   NA 136 04/28/2017 0241   K 2.8 (L) 04/28/2017 0241   CL 104 04/28/2017 0241   CO2 27 04/28/2017 0241   GLUCOSE 132 (H) 04/28/2017 0241   BUN 14 04/28/2017 0241   CREATININE 0.86 04/28/2017 0241   CREATININE 0.88 12/18/2011 1423   CALCIUM 7.5 (L) 04/28/2017 0241   PROT 7.4 04/25/2017 1634   ALBUMIN 2.1 (L) 04/25/2017 1634   AST 83 (H) 04/25/2017 1634   ALT 37 04/25/2017 1634   ALKPHOS 118 04/25/2017 1634   BILITOT 0.8 04/25/2017 1634   GFRNONAA >60 04/28/2017 0241   GFRNONAA 86 12/18/2011 1423   GFRAA >60 04/28/2017 0241   GFRAA >89 12/18/2011 1423   Meds  Current Facility-Administered Medications:  .  0.9 %  sodium chloride infusion, 250 mL, Intravenous, PRN, Omar Person, NP .  chlorhexidine gluconate (MEDLINE KIT) (PERIDEX) 0.12 % solution 15 mL, 15 mL, Mouth Rinse, BID, Charlesetta Garibaldi, MD, 15 mL at 04/28/17 0756 .  diltiazem (CARDIZEM) 10 mg/ml oral suspension 60 mg, 60 mg, Per Tube, Q6H, Erick Colace, NP, 60 mg at 04/28/17 0517 .  feeding supplement (VITAL HIGH PROTEIN) liquid 1,000 mL, 1,000 mL, Per Tube, Continuous, Rush Farmer, MD, Last Rate: 60 mL/hr at 04/28/17 0600, 1,000 mL at 04/28/17 0600 .  fentaNYL (SUBLIMAZE) injection 50 mcg, 50 mcg, Intravenous, Q2H PRN, Omar Person, NP, 50 mcg at 04/28/17 0055 .  free water 200 mL, 200 mL, Per Tube, Q4H, Salvadore Dom E, NP, 200 mL at 04/28/17 0800 .  heparin injection 5,000 Units, 5,000 Units, Subcutaneous, Q8H, Omar Person, NP, 5,000 Units at 04/28/17 0510 .  insulin aspart (novoLOG) injection 0-20 Units, 0-20 Units, Subcutaneous, Q4H, Erick Colace, NP, 3 Units at 04/28/17 0446 .  insulin aspart (novoLOG) injection 4 Units, 4 Units, Subcutaneous, Q4H, Erick Colace, NP, 4 Units at 04/27/17 1153 .  lactated ringers infusion, , Intravenous, Continuous, Erick Colace, NP, Last Rate: 50 mL/hr at 04/28/17 0600 .  levETIRAcetam (KEPPRA) 500 mg in sodium chloride 0.9 % 100 mL IVPB, 500 mg, Intravenous, Q12H, Corey Harold, NP, Last Rate: 420 mL/hr at 04/28/17 0756, 500 mg at 04/28/17 0756 .  MEDLINE mouth rinse, 15 mL, Mouth Rinse, 10 times per day, Charlesetta Garibaldi, MD, 15 mL at  04/28/17 0640 .  pantoprazole sodium (PROTONIX) 40 mg/20 mL oral suspension 40 mg, 40 mg, Per Tube, Q1200, Erick Colace, NP, 40 mg at 04/27/17 1323 .  phenylephrine (NEO-SYNEPHRINE) 10 mg in sodium chloride 0.9 % 250 mL (0.04 mg/mL) infusion, 0-400 mcg/min, Intravenous, Titrated, Erick Colace, NP, Stopped at 04/26/17 1440  EEG from yesterday shows moderate generalized cerebral dysfunction with no epileptiform activity.  Assessment 76 year old man with a past history of stroke with left-sided deficits, dementia who presented in atrial fibrillation with RVR and had a witnessed seizure. He received benzodiazepines, IV Keppra followed by B Dilantin. After the ministration of IV Dilantin he went into complete heart block and had to be resuscitated. He is currently intubated and in the ICU.  Mentation gradually improving. He definitely is still in some degree of an cephalopathy but I believe that will resolve with time and reduction in any sedation.  Impression Seizures Possible epilepsy pressure was continue Toxic metabolic encephalopathy  Recommendations Continue with Keppra 500 mg twice a day. Can use benzodiazepines as needed for clinical seizures Correction of toxic metabolic derangements per primary team as you are. Especially severely anemic now, would recommend work up per primary team. Please recall with questions.  -- Amie Portland, MD [facility] [phone]  If 7pm to 7am, please call on call as listed on AMION.

## 2017-04-28 NOTE — Progress Notes (Signed)
Changed vent mode back to [facility] due to many periods of Apnea.

## 2017-04-28 NOTE — Progress Notes (Signed)
PULMONARY / CRITICAL CARE MEDICINE   Name: [name]. MRN: [id number] DOB: Jun 09, 1941    ADMISSION DATE:  04/25/2017 CONSULTATION DATE:  04/25/17  REFERRING MD:  Thomasene Lot  CHIEF COMPLAINT:  AMS  HISTORY OF PRESENT ILLNESS:  Pt is encephelopathic; therefore, this HPI is obtained from chart review. [name]. is a 76 y.o. M w/ h/o but not limited to CAD, stroke with residual left-sided weakness, right PCA infarct with possible left-sided visual field cuts, DM, dementia.  He presented [facility] on 7/25 with AMS and as a code stroke. Apparently he had left-sided deficits per AMS. One day prior, he had a hypoglycemic event and required treatment by EMS; however, he was not transferred to hospital.  Upon arrival to ED, he had a focal seizure. Neurology evaluated him at bedside and obtain CT of the head. Immediately following CT, he had left facial twitching for which he received Ativan followed by Keppra as well as Dilantin. He developed  Complete heart block and associated shock. Subsequently intubated for airway protection. Prior to intubation, there was concern for aspiration event.  He was evaluated by cardiology for possible pacemaker needs.   SUBJECTIVE:   Off pressors and sedation  VITAL SIGNS: BP 116/75   Pulse (!) 118   Temp 98.4 F (36.9 C) (Axillary)   Resp 15   Ht 6' (1.829 m)   Wt 69 kg (152 lb 1.9 oz)   SpO2 99%   BMI 20.63 kg/m   HEMODYNAMICS:    VENTILATOR SETTINGS: Vent Mode: PSV FiO2 (%):  [30 %-40 %] 30 % Set Rate:  [16 bmp] 16 bmp Vt Set:  [500 mL] 500 mL PEEP:  [5 cmH20] 5 cmH20 Pressure Support:  [10 cmH20] 10 cmH20 Plateau Pressure:  [14 cmH20-16 cmH20] 16 cmH20  INTAKE / OUTPUT: I/O last 3 completed shifts: In: 5316.7 [I.V.:1441.7; NG/GT:2800; IV Piggyback:1075] Out: 945 [Urine:945]  PHYSICAL EXAMINATION: Physical Exam  Constitutional: He appears well-nourished. He appears lethargic. No distress. He is intubated.  HENT:  Head: Normocephalic  and atraumatic.  Eyes: Pupils are equal, round, and reactive to light. Conjunctivae and EOM are normal.  Neck: Normal range of motion. Neck supple. No thyromegaly present.  Cardiovascular: Normal rate and normal pulses.  An irregularly irregular rhythm present.  Pulmonary/Chest: Effort normal. He is intubated. No respiratory distress. He has decreased breath sounds.  Abdominal: Soft. Normal appearance and bowel sounds are normal.  Musculoskeletal:  Left arm contracted  Neurological: He appears lethargic. GCS eye subscore is 3. GCS verbal subscore is 1. GCS motor subscore is 5.  Left arm weak (baseline)  Skin: Skin is warm, dry and intact. He is not diaphoretic.   LABS:  BMET  Recent Labs Lab 04/26/17 0227 04/27/17 0516 04/28/17 0241  NA 138 137 136  K 2.8* 4.0 2.8*  CL 104 108 104  CO2 27 24 27   BUN 12 16 14   CREATININE 1.19 0.97 0.86  GLUCOSE 349* 388* 132*   Electrolytes  Recent Labs Lab 04/26/17 0227  04/26/17 1840 04/27/17 0516 04/27/17 2159 04/28/17 0241  CALCIUM 7.4*  --   --  7.4*  --  7.5*  MG 1.5*  < > 2.4 2.2 1.8  --   PHOS 3.3  < > 2.3* 1.8* 2.3*  --   < > = values in this interval not displayed.  CBC  Recent Labs Lab 04/25/17 1634 04/25/17 1643 04/26/17 0227 04/28/17 0241  WBC 9.0  --  10.5 10.7*  10.5  HGB  10.8* 12.6* 8.6* 7.8*  7.8*  HCT 35.2* 37.0* 29.0* 26.4*  26.0*  PLT 257  --  224 191  197   Coag's  Recent Labs Lab 04/25/17 1634  APTT 26  INR 1.20   Sepsis Markers  Recent Labs Lab 04/26/17 1025 04/27/17 0516 04/28/17 0241  PROCALCITON 0.50 0.53 0.35   ABG  Recent Labs Lab 04/25/17 1827 04/27/17 0316  PHART 7.367 7.397  PCO2ART 52.5* 41.2  PO2ART 321.0* 96.5   Liver Enzymes  Recent Labs Lab 04/25/17 1634  AST 83*  ALT 37  ALKPHOS 118  BILITOT 0.8  ALBUMIN 2.1*   Cardiac Enzymes No results for input(s): TROPONINI, PROBNP in the last 168 hours.  Glucose  Recent Labs Lab 04/27/17 1521  04/27/17 2002 04/27/17 2043 04/27/17 2334 04/28/17 0343 04/28/17 0718  GLUCAP 88 45* 101* 141* 131* 101*   Imaging Dg Chest Port 1 View  Result Date: 04/28/2017 CLINICAL DATA:  ET tube, atelectasis EXAM: PORTABLE CHEST 1 VIEW COMPARISON:  04/26/2017 FINDINGS: Support devices are unchanged. Bibasilar atelectasis or infiltrates, left greater than right, similar to prior study. Mild cardiomegaly. No visible significant effusions. IMPRESSION: Bibasilar atelectasis or infiltrates, left greater than right. Electronically Signed   By: Rolm Baptise M.D.   On: 04/28/2017 07:32   STUDIES:  CT head 7/25 > atrophy and chronic ischemic changes, no acute abnormality. CXR 7/25 > bibasilar atelectasis. EEG 7/25:>>>neg for seizures   CULTURES: None.  ANTIBIOTICS: None.  SIGNIFICANT EVENTS: 7/25 > admitted.  LINES/TUBES: ETT 7/25 >   DISCUSSION: 76 y.o M admitted 7/25 with AMS and seizures. Received Ativan, Keppra, Dilantin. After receiving Dilantin, had postictal state followed by third-degree heart block and near arrest. He was subsequently intubated and PCCM was asked to admit. MS still poor and won't support extubation but waking up some -cont supportive care and give time  -adding low dose ccb and fluid challenge give PAF    ASSESSMENT / PLAN:  PULMONARY A: VDRF - due to inability to protect airway in setting of seizures and AMS. Possible aspiration during intubation  Tobacco dependence. Excellent Vt on PSV Not ready for extubation given MS  P:   PSV but no extubation given mental status Limit sedating meds VAP protocol   CARDIOVASCULAR A:  CHB - following administration of Dilantin. Cardiology called by EDP. Shock - cardiogenic, improved but still on pressors Hx HTN, CAD, LVH, MI. Atrial fib w/ intermittent RVR P:  KVO IVF Diureses as below Low dose CCB Cont tele   RENAL A:   Hypophosphatemia Resolved AKI  P:   Replace electrolytes as indicated BMET in  AM KVO IVF Lasix 40 mg IV q8 x2 doses  GASTROINTESTINAL A:   Nutrition. GI prophylaxis. Hx pancreatitis. Protein calorie malnutrition associated w/ critical illness P:   Cont ppi and tubefeeds  HEMATOLOGIC A:   Anemia (at baseline) P:  Trend cbc Transfuse per protocol  [place] heparin   INFECTIOUS A:   Likely aspiration event PCT negative, no abx. No sig fever  P:   Trend fever and wbc curve Repeat CXR in am   ENDOCRINE A:   Hx DM w/ hyperglycemia  P:   cbgs and add back ssi Will hold off on tubefeed coverage given hypoglycemia 7/26  NEUROLOGIC A:   Acute encephalopathy secondary to post ictal state and possible hypoperfusion injury Seizures - no history of this ? CVA No seizures on EEG. Generalized slowing  More awake today following commands  P:  RAS goal 0 Cont keppra Supportive care Seizure precautions  RAS goal 0 to -1  FAMILY  - Updates: No family bedside  - Inter-disciplinary family meet or Palliative Care meeting due by:  7/31  The patient is critically ill with multiple organ systems failure and requires high complexity decision making for assessment and support, frequent evaluation and titration of therapies, application of advanced monitoring technologies and extensive interpretation of multiple databases.   Critical Care Time devoted to patient care services described in this note is  35  Minutes. This time reflects time of care of this signee Dr Jennet Maduro. This critical care time does not reflect procedure time, or teaching time or supervisory time of PA/NP/Med student/Med Resident etc but could involve care discussion time.  Rush Farmer, M.D. [facility] Pulmonary/Critical Care Medicine. Pager: [phone]. After hours pager: [phone].  04/28/2017 11:15 AM

## 2017-04-29 ENCOUNTER — Inpatient Hospital Stay (HOSPITAL_COMMUNITY): Payer: Medicare Other

## 2017-04-29 DIAGNOSIS — I10 Essential (primary) hypertension: Secondary | ICD-10-CM

## 2017-04-29 LAB — BASIC METABOLIC PANEL
Anion gap: 6 (ref 5–15)
BUN: 14 mg/dL (ref 6–20)
CHLORIDE: 102 mmol/L (ref 101–111)
CO2: 27 mmol/L (ref 22–32)
CREATININE: 0.92 mg/dL (ref 0.61–1.24)
Calcium: 7.8 mg/dL — ABNORMAL LOW (ref 8.9–10.3)
GFR calc non Af Amer: >60 mL/min (ref >60)
Glucose, Bld: 121 mg/dL — ABNORMAL HIGH (ref 65–99)
POTASSIUM: 5.3 mmol/L — AB (ref 3.5–5.1)
Sodium: 135 mmol/L (ref 135–145)

## 2017-04-29 LAB — GLUCOSE, CAPILLARY
GLUCOSE-CAPILLARY: 185 mg/dL — AB (ref 65–99)
GLUCOSE-CAPILLARY: 180 mg/dL — AB (ref 65–99)
Glucose-Capillary: 159 mg/dL — ABNORMAL HIGH (ref 65–99)
Glucose-Capillary: 161 mg/dL — ABNORMAL HIGH (ref 65–99)
Glucose-Capillary: 193 mg/dL — ABNORMAL HIGH (ref 65–99)

## 2017-04-29 LAB — POCT I-STAT 3, ART BLOOD GAS (G3+)
Acid-Base Excess: 6 mmol/L — ABNORMAL HIGH (ref 0.0–2.0)
BICARBONATE: 30.7 mmol/L — AB (ref 20.0–28.0)
O2 SAT: 96 %
PH ART: 7.476 — AB (ref 7.350–7.450)
Patient temperature: 98.2
TCO2: 32 mmol/L (ref 0–100)
pCO2 arterial: 41.5 mmHg (ref 32.0–48.0)
pO2, Arterial: 75 mmHg — ABNORMAL LOW (ref 83.0–108.0)

## 2017-04-29 LAB — CBC
HEMATOCRIT: 26 % — AB (ref 39.0–52.0)
HEMOGLOBIN: 8 g/dL — AB (ref 13.0–17.0)
MCH: 22.1 pg — AB (ref 26.0–34.0)
MCHC: 30.8 g/dL (ref 30.0–36.0)
MCV: 71.8 fL — ABNORMAL LOW (ref 78.0–100.0)
Platelets: 196 10*3/uL (ref 150–400)
RBC: 3.62 MIL/uL — AB (ref 4.22–5.81)
RDW: 23.6 % — ABNORMAL HIGH (ref 11.5–15.5)
WBC: 10.5 10*3/uL (ref 4.0–10.5)

## 2017-04-29 LAB — PHOSPHORUS: PHOSPHORUS: 1.8 mg/dL — AB (ref 2.5–4.6)

## 2017-04-29 LAB — MAGNESIUM: MAGNESIUM: 1.6 mg/dL — AB (ref 1.7–2.4)

## 2017-04-29 MED ORDER — MAGNESIUM SULFATE 2 GM/50ML IV SOLN
2.0000 g | Freq: Once | INTRAVENOUS | Status: AC
  Administered 2017-04-29: 2 g via INTRAVENOUS
  Filled 2017-04-29: qty 50

## 2017-04-29 MED ORDER — FUROSEMIDE 10 MG/ML IJ SOLN
40.0000 mg | Freq: Four times a day (QID) | INTRAMUSCULAR | Status: AC
  Administered 2017-04-29 (×3): 40 mg via INTRAVENOUS
  Filled 2017-04-29 (×3): qty 4

## 2017-04-29 MED ORDER — SODIUM PHOSPHATES 45 MMOLE/15ML IV SOLN
30.0000 mmol | Freq: Once | INTRAVENOUS | Status: AC
  Administered 2017-04-29: 30 mmol via INTRAVENOUS
  Filled 2017-04-29: qty 10

## 2017-04-29 NOTE — Progress Notes (Signed)
PULMONARY / CRITICAL CARE MEDICINE   Name: [name]. MRN: [id number] DOB: 1940/12/08    ADMISSION DATE:  04/25/2017 CONSULTATION DATE:  04/25/17  REFERRING MD:  Thomasene Lot  CHIEF COMPLAINT:  AMS  HISTORY OF PRESENT ILLNESS:  Pt is encephelopathic; therefore, this HPI is obtained from chart review. [name]. is a 76 y.o. M w/ h/o but not limited to CAD, stroke with residual left-sided weakness, right PCA infarct with possible left-sided visual field cuts, DM, dementia.  He presented [facility] on 7/25 with AMS and as a code stroke. Apparently he had left-sided deficits per AMS. One day prior, he had a hypoglycemic event and required treatment by EMS; however, he was not transferred to hospital.  Upon arrival to ED, he had a focal seizure. Neurology evaluated him at bedside and obtain CT of the head. Immediately following CT, he had left facial twitching for which he received Ativan followed by Keppra as well as Dilantin. He developed  Complete heart block and associated shock. Subsequently intubated for airway protection. Prior to intubation, there was concern for aspiration event.  He was evaluated by cardiology for possible pacemaker needs.   SUBJECTIVE:   Off pressors and sedation  VITAL SIGNS: BP 117/68   Pulse 78   Temp 98.1 F (36.7 C) (Axillary)   Resp 17   Ht 6' (1.829 m)   Wt 67.1 kg (147 lb 14.9 oz)   SpO2 100%   BMI 20.06 kg/m   HEMODYNAMICS:    VENTILATOR SETTINGS: Vent Mode: CPAP;PSV FiO2 (%):  [40 %] 40 % Set Rate:  [16 bmp] 16 bmp Vt Set:  [500 mL] 500 mL PEEP:  [5 cmH20] 5 cmH20 Pressure Support:  [8 cmH20] 8 cmH20 Plateau Pressure:  [12 cmH20-14 cmH20] 14 cmH20  INTAKE / OUTPUT: I/O last 3 completed shifts: In: 4081 [I.V.:1850; KG/YJ:8563; IV Piggyback:210] Out: 1497 [Urine:4875]  PHYSICAL EXAMINATION: Physical Exam  Constitutional: He appears well-nourished. He appears lethargic. No distress. He is intubated.  HENT:  Head: Normocephalic and  atraumatic.  Eyes: Pupils are equal, round, and reactive to light. Conjunctivae and EOM are normal.  Neck: Normal range of motion. Neck supple. No thyromegaly present.  Cardiovascular: Normal rate and normal pulses.  An irregularly irregular rhythm present.  Pulmonary/Chest: Effort normal. He is intubated. No respiratory distress. He has decreased breath sounds.  Abdominal: Soft. Normal appearance and bowel sounds are normal.  Musculoskeletal:  Left arm contracted  Neurological: He appears lethargic. GCS eye subscore is 3. GCS verbal subscore is 1. GCS motor subscore is 5.  Left arm weak (baseline) Opens eyes to stimulation but not following commands  Skin: Skin is warm, dry and intact. He is not diaphoretic.   LABS:  BMET  Recent Labs Lab 04/27/17 0516 04/28/17 0241 04/29/17 0221  NA 137 136 135  K 4.0 2.8* 5.3*  CL 108 104 102  CO2 24 27 27   BUN 16 14 14   CREATININE 0.97 0.86 0.92  GLUCOSE 388* 132* 121*   Electrolytes  Recent Labs Lab 04/27/17 0516 04/27/17 2159 04/28/17 0241 04/29/17 0221  CALCIUM 7.4*  --  7.5* 7.8*  MG 2.2 1.8  --  1.6*  PHOS 1.8* 2.3*  --  1.8*    CBC  Recent Labs Lab 04/26/17 0227 04/28/17 0241 04/29/17 0221  WBC 10.5 10.7*  10.5 10.5  HGB 8.6* 7.8*  7.8* 8.0*  HCT 29.0* 26.4*  26.0* 26.0*  PLT 224 191  197 196   Coag's  Recent  Labs Lab 04/25/17 1634  APTT 26  INR 1.20   Sepsis Markers  Recent Labs Lab 04/26/17 1025 04/27/17 0516 04/28/17 0241  PROCALCITON 0.50 0.53 0.35   ABG  Recent Labs Lab 04/25/17 1827 04/27/17 0316 04/29/17 0400  PHART 7.367 7.397 7.476*  PCO2ART 52.5* 41.2 41.5  PO2ART 321.0* 96.5 75.0*   Liver Enzymes  Recent Labs Lab 04/25/17 1634  AST 83*  ALT 37  ALKPHOS 118  BILITOT 0.8  ALBUMIN 2.1*   Cardiac Enzymes No results for input(s): TROPONINI, PROBNP in the last 168 hours.  Glucose  Recent Labs Lab 04/28/17 1151 04/28/17 1535 04/28/17 1940 04/28/17 2354  04/29/17 0338 04/29/17 0724  GLUCAP 93 213* 202* 139* 159* 161*   Imaging Dg Chest Port 1 View  Result Date: 04/29/2017 CLINICAL DATA:  ET tube EXAM: PORTABLE CHEST 1 VIEW COMPARISON:  04/28/2017 FINDINGS: Endotracheal tube and NG tube are unchanged. Bilateral lower lobe atelectasis or infiltrates, increased slightly since prior study. Suspect small layering effusions. Mild cardiomegaly. IMPRESSION: Slight increase in bibasilar atelectasis or infiltrates. Small effusions. Electronically Signed   By: Rolm Baptise M.D.   On: 04/29/2017 07:27   STUDIES:  CT head 7/25 > atrophy and chronic ischemic changes, no acute abnormality. CXR 7/25 > bibasilar atelectasis. EEG 7/25:>>>neg for seizures   CULTURES: None.  ANTIBIOTICS: None.  SIGNIFICANT EVENTS: 7/25 > admitted.  LINES/TUBES: ETT 7/25 >   DISCUSSION: 76 y.o M admitted 7/25 with AMS and seizures. Received Ativan, Keppra, Dilantin. After receiving Dilantin, had postictal state followed by third-degree heart block and near arrest. He was subsequently intubated and PCCM was asked to admit. MS still poor and won't support extubation but waking up some  ASSESSMENT / PLAN:  PULMONARY A: VDRF - due to inability to protect airway in setting of seizures and AMS. Possible aspiration during intubation  Tobacco dependence. Excellent Vt on PSV Not ready for extubation given MS  P:   PS trials but no extubation given mental status D/C all sedation VAP protocol  CARDIOVASCULAR A:  CHB - following administration of Dilantin. Cardiology called by EDP. Shock - cardiogenic, improved but still on pressors Hx HTN, CAD, LVH, MI. Atrial fib w/ intermittent RVR P:  KVO IVF Diureses as below Low dose CCB Cont tele   RENAL A:   Hypophosphatemia Resolved AKI  P:   Replace electrolytes as indicated BMET in AM KVO IVF Lasix 40 mg IV q6 x3 doses Mg 2 gm Na3PO4 IV ordered  GASTROINTESTINAL A:   Nutrition. GI prophylaxis. Hx  pancreatitis. Protein calorie malnutrition associated w/ critical illness P:   Cont ppi and tubefeeds  HEMATOLOGIC A:   Anemia (at baseline) P:  Trend cbc Transfuse per protocol  [place] heparin   INFECTIOUS A:   Likely aspiration event PCT negative, no abx. No sig fever  P:   Trend fever and wbc curve Repeat CXR in am   ENDOCRINE A:   Hx DM w/ hyperglycemia  P:   cbgs and add back ssi Will hold off on tubefeed coverage given hypoglycemia 7/26  NEUROLOGIC A:   Acute encephalopathy secondary to post ictal state and possible hypoperfusion injury Seizures - no history of this ? CVA No seizures on EEG. Generalized slowing  More awake today following commands  P:   RAS goal 0 Cont keppra Supportive care Seizure precautions  RAS goal 0 to -1  FAMILY  - Updates: No family bedside  - Inter-disciplinary family meet or Palliative Care meeting due by:  7/31  The patient is critically ill with multiple organ systems failure and requires high complexity decision making for assessment and support, frequent evaluation and titration of therapies, application of advanced monitoring technologies and extensive interpretation of multiple databases.   Critical Care Time devoted to patient care services described in this note is  35  Minutes. This time reflects time of care of this signee Dr Jennet Maduro. This critical care time does not reflect procedure time, or teaching time or supervisory time of PA/NP/Med student/Med Resident etc but could involve care discussion time.  Rush Farmer, M.D. [facility] Pulmonary/Critical Care Medicine. Pager: [phone]. After hours pager: [phone].  04/29/2017 11:52 AM

## 2017-04-30 ENCOUNTER — Inpatient Hospital Stay (HOSPITAL_COMMUNITY): Payer: Medicare Other

## 2017-04-30 LAB — GLUCOSE, CAPILLARY
GLUCOSE-CAPILLARY: 135 mg/dL — AB (ref 65–99)
GLUCOSE-CAPILLARY: 138 mg/dL — AB (ref 65–99)
GLUCOSE-CAPILLARY: 155 mg/dL — AB (ref 65–99)
GLUCOSE-CAPILLARY: 199 mg/dL — AB (ref 65–99)
Glucose-Capillary: 143 mg/dL — ABNORMAL HIGH (ref 65–99)
Glucose-Capillary: 183 mg/dL — ABNORMAL HIGH (ref 65–99)
Glucose-Capillary: 217 mg/dL — ABNORMAL HIGH (ref 65–99)

## 2017-04-30 LAB — MAGNESIUM: Magnesium: 1.7 mg/dL (ref 1.7–2.4)

## 2017-04-30 LAB — BASIC METABOLIC PANEL
ANION GAP: 8 (ref 5–15)
BUN: 12 mg/dL (ref 6–20)
CALCIUM: 7.9 mg/dL — AB (ref 8.9–10.3)
CO2: 30 mmol/L (ref 22–32)
Chloride: 94 mmol/L — ABNORMAL LOW (ref 101–111)
Creatinine, Ser: 0.89 mg/dL (ref 0.61–1.24)
GFR calc non Af Amer: >60 mL/min (ref >60)
Glucose, Bld: 164 mg/dL — ABNORMAL HIGH (ref 65–99)
Potassium: 3.6 mmol/L (ref 3.5–5.1)
Sodium: 132 mmol/L — ABNORMAL LOW (ref 135–145)

## 2017-04-30 LAB — POCT I-STAT 3, ART BLOOD GAS (G3+)
ACID-BASE EXCESS: 12 mmol/L — AB (ref 0.0–2.0)
Bicarbonate: 36 mmol/L — ABNORMAL HIGH (ref 20.0–28.0)
O2 SAT: 93 %
PH ART: 7.504 — AB (ref 7.350–7.450)
Patient temperature: 98
TCO2: 37 mmol/L (ref 0–100)
pCO2 arterial: 45.6 mmHg (ref 32.0–48.0)
pO2, Arterial: 61 mmHg — ABNORMAL LOW (ref 83.0–108.0)

## 2017-04-30 LAB — PHOSPHORUS: PHOSPHORUS: 3.4 mg/dL (ref 2.5–4.6)

## 2017-04-30 MED ORDER — SENNOSIDES 8.8 MG/5ML PO SYRP
5.0000 mL | ORAL_SOLUTION | Freq: Two times a day (BID) | ORAL | Status: DC
  Administered 2017-04-30 – 2017-05-15 (×21): 5 mL
  Filled 2017-04-30 (×24): qty 5

## 2017-04-30 NOTE — Progress Notes (Signed)
[facility] Attending Note  ADMISSION DATE:  04/25/2017  CONSULTATION DATE:  04/25/2017  REFERRING MD:  Thomasene Lot  CHIEF COMPLAINT:  Altered Mental Status  Presenting HPI:  76 y.o. male w/ h/o but not limited to CAD, stroke with residual left-sided weakness, right PCA infarct with possible left-sided visual field cuts, DM, dementia.  He presented [facility] on 7/25 with AMS and as a code stroke. Apparently he had left-sided deficits per AMS. One day prior, he had a hypoglycemic event and required treatment by EMS; however, he was not transferred to hospital. Upon arrival to ED, he had a focal seizure. Neurology evaluated him at bedside and obtain CT of the head. Immediately following CT, he had left facial twitching for which he received Ativan followed by Keppra as well as Dilantin. He developed  Complete heart block and associated shock. Subsequently intubated for airway protection. Prior to intubation, there was concern for aspiration event.  He was evaluated by cardiology for possible pacemaker needs.  Subjective:  No acute events overnight. Patient still not consistently following commands.   Review of Systems:  Unable to obtain given intubation.   Vent Mode: CPAP;PSV FiO2 (%):  [30 %-40 %] 40 % Set Rate:  [16 bmp] 16 bmp Vt Set:  [500 mL] 500 mL PEEP:  [2 cmH20-5 cmH20] 5 cmH20 Pressure Support:  [8 cmH20-10 cmH20] 8 cmH20 Plateau Pressure:  [12 cmH20-13 cmH20] 13 cmH20  Temp:  [98 F (36.7 C)-98.7 F (37.1 C)] 98.4 F (36.9 C) (07/30 0800) Pulse Rate:  [52-123] 86 (07/30 0900) Resp:  [11-24] 18 (07/30 0900) BP: (97-157)/(52-95) 119/69 (07/30 0900) SpO2:  [91 %-100 %] 100 % (07/30 0900) FiO2 (%):  [30 %-40 %] 40 % (07/30 0841) Weight:  [151 lb 7.3 oz (68.7 kg)] 151 lb 7.3 oz (68.7 kg) (07/30 0600)  General:  No family at bedside. Intubated. No distress. Integument:  Warm & dry. No rash on exposed skin. HEENT:  Moist mucus memebranes. No scleral icterus.  Endotracheal tube in place. Neurological:  Pupils symmetric. Spontaneously opening her eyes. Not following commands. Musculoskeletal:  No joint effusion or erythema appreciated. Contractures left hand. Pulmonary:  Symmetric chest wall rise on ventilator. Clear breath sounds bilaterally. Cardiovascular:  Regular rate & rhythm. No appreciable JVD. Normal S1 & S2. Telemetry:  Sinus rhythm. Abdomen:  Soft. Nondistended. Hypoactive bowel sounds.  LINES/TUBES: OETT 7/25 >>> OGT 7/25 >>> PIV  CBC Latest Ref Rng & Units 04/29/2017 04/28/2017 04/28/2017  WBC 4.0 - 10.5 K/uL 10.5 10.7(H) 10.5  Hemoglobin 13.0 - 17.0 g/dL 8.0(L) 7.8(L) 7.8(L)  Hematocrit 39.0 - 52.0 % 26.0(L) 26.4(L) 26.0(L)  Platelets 150 - 400 K/uL 196 191 197    BMP Latest Ref Rng & Units 04/30/2017 04/29/2017 04/28/2017  Glucose 65 - 99 mg/dL 164(H) 121(H) 132(H)  BUN 6 - 20 mg/dL '12 14 14  ' Creatinine 0.61 - 1.24 mg/dL 0.89 0.92 0.86  Sodium 135 - 145 mmol/L 132(L) 135 136  Potassium 3.5 - 5.1 mmol/L 3.6 5.3(H) 2.8(L)  Chloride 101 - 111 mmol/L 94(L) 102 104  CO2 22 - 32 mmol/L '30 27 27  ' Calcium 8.9 - 10.3 mg/dL 7.9(L) 7.8(L) 7.5(L)    Hepatic Function Latest Ref Rng & Units 04/25/2017 05/10/2016 05/04/2016  Total Protein 6.5 - 8.1 g/dL 7.4 6.0(L) 6.6  Albumin 3.5 - 5.0 g/dL 2.1(L) 2.8(L) 3.2(L)  AST 15 - 41 U/L 83(H) 31 21  ALT 17 - 63 U/L 37 20 12(L)  Alk Phosphatase 38 -  126 U/L 118 64 81  Total Bilirubin 0.3 - 1.2 mg/dL 0.8 0.5 0.2(L)  Bilirubin, Direct 0.0 - 0.3 mg/dL - - -    IMAGING/STUDIES: UDS 7/26:  Positive Benzos TTE 7/26:  LV normal in size with moderate LVH & focal basal septal hypertrophy. EF 35-40% with diffuse hypokinesis. LA mild to moderately dilated & RA normal in size. RV normal in size and function. No aortic stenosis or regurgitation. Aortic root normal in size. Mild to moderate mitral regurgitation without stenosis. No pulmonic stenosis. Mild tricuspid regurgitation. No pericardial effusion. MRI  BRAIN W/O 7/26:  Moderate atrophy. 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 Extensive chronic ischemic change as above. No acute infarct. Numerous areas of chronic microhemorrhage the brain likely related to hypertension. EEG 7/27:  The EEG is abnormal and findings are suggestive of Moderate Generalized cerebral dysfunction. Epileptiform activity was not seen during this recording. PORT CXR 7/30:  Personally reviewed by me. Enteric feeding tube coursing low diaphragm & endotracheal tube in good position. Persistent silhouetting of left hemidiaphragm with left lower lung opacity as well as patchy right mid to lower lung opacification. Improved aeration of right lower lung compared with previous imaging.  MICROBIOLOGY: MRSA PCR 7/26:  Negative   ANTIBIOTICS: None.  SIGNIFICANT EVENTS: 07/25 - Admit & weaned of Levophed after developing complete heart block following 1 dose of Dilantin.  ASSESSMENT/PLAN:  76 y.o. male admitted with acute encephalopathy secondary to seizures. Respiratory failure remains tenuous.  1. Acute encephalopathy: Secondary to seizures. Continuing to attempt to minimize sedation. 2. Acute hypoxic respiratory failure: Secondary to probable aspiration in the setting of altered mental status. Daily spontaneous breathing trial and pressure support wean. 3. Seizures: No evidence on EKG from 7/27. Neurology has seen and signed off. Continuing Keppra. No further Dilantin given adverse reaction. Continuing seizure precautions. 4. Complete heart block: Previously seen by cardiology. Secondary to Dilantin. Monitoring patient on telemetry. 5. Shock: Cardiogenic in etiology. Resolved. 6. Atrial fibrillation: Continuing diltiazem via tube every 6 hours. Monitoring patient on telemetry. 7. History of essential hypertension: Monitoring vitals per unit protocol. 8. History of coronary artery disease: Continuing to monitor the patient  on telemetry. 9. History of systolic congestive heart failure: Monitoring fluid status closely. No signs of acute decompensation. 10. Acute renal failure: Resolved. 11. Hypophosphatemia: Resolved. 12. Protein-calorie malnutrition: Continuing tube feedings per dietary recommendations. 13. Chronic anemia: Hemoglobin stable. No signs of active bleeding. Trending cell counts daily with CBC. 14. Diabetes mellitus: Glucose controlled. Continuing Accu-Cheks every 4 hours with sliding scale insulin per resistant algorithm.  Prophylaxis:  Heparin [place] q8hr, SCDs, & Protonix via tube daily. Diet:  NPO. Continuing tube feedings.  Code Status:  Full Code per previous physician discussions. Disposition:  Remains critically ill in the intensive care unit. May require one way extubation versus tracheostomy placement. Family Update:  Daughter updated by me via phone.   I have personally spent a total of 32 minutes of critical care time today caring for the patient, updating family via phone, & reviewing the patient's electronic medical record.  Sonia Baller Ashok Cordia, M.D. [facility] Pulmonary & Critical Care Pager:  [phone] After 3pm or if no response, call [phone] 9:36 AM 04/30/17

## 2017-04-30 NOTE — Plan of Care (Signed)
Problem: Nutrition: Goal: Adequate nutrition will be maintained Outcome: Completed/Met Date Met: 04/30/17 TF at goal

## 2017-05-01 ENCOUNTER — Inpatient Hospital Stay (HOSPITAL_COMMUNITY): Payer: Medicare Other

## 2017-05-01 LAB — GLUCOSE, CAPILLARY
GLUCOSE-CAPILLARY: 192 mg/dL — AB (ref 65–99)
GLUCOSE-CAPILLARY: 148 mg/dL — AB (ref 65–99)
Glucose-Capillary: 172 mg/dL — ABNORMAL HIGH (ref 65–99)
Glucose-Capillary: 193 mg/dL — ABNORMAL HIGH (ref 65–99)
Glucose-Capillary: 158 mg/dL — ABNORMAL HIGH (ref 65–99)
Glucose-Capillary: 165 mg/dL — ABNORMAL HIGH (ref 65–99)

## 2017-05-01 LAB — RENAL FUNCTION PANEL
ANION GAP: 5 (ref 5–15)
Albumin: 1.5 g/dL — ABNORMAL LOW (ref 3.5–5.0)
BUN: 13 mg/dL (ref 6–20)
CALCIUM: 8.1 mg/dL — AB (ref 8.9–10.3)
CO2: 31 mmol/L (ref 22–32)
Chloride: 93 mmol/L — ABNORMAL LOW (ref 101–111)
Creatinine, Ser: 0.86 mg/dL (ref 0.61–1.24)
GFR calc Af Amer: >60 mL/min (ref >60)
GFR calc non Af Amer: >60 mL/min (ref >60)
GLUCOSE: 185 mg/dL — AB (ref 65–99)
Phosphorus: 2.6 mg/dL (ref 2.5–4.6)
Potassium: 3.5 mmol/L (ref 3.5–5.1)
SODIUM: 129 mmol/L — AB (ref 135–145)

## 2017-05-01 LAB — CBC WITH DIFFERENTIAL/PLATELET
BASOS PCT: 0 %
Basophils Absolute: 0 10*3/uL (ref 0.0–0.1)
EOS ABS: 0.3 10*3/uL (ref 0.0–0.7)
EOS PCT: 3 %
HEMATOCRIT: 24.5 % — AB (ref 39.0–52.0)
HEMOGLOBIN: 7.6 g/dL — AB (ref 13.0–17.0)
LYMPHS PCT: 21 %
Lymphs Abs: 2.3 10*3/uL (ref 0.7–4.0)
MCH: 22.4 pg — AB (ref 26.0–34.0)
MCHC: 31 g/dL (ref 30.0–36.0)
MCV: 72.3 fL — AB (ref 78.0–100.0)
Monocytes Absolute: 1 10*3/uL (ref 0.1–1.0)
Monocytes Relative: 9 %
NEUTROS ABS: 7.4 10*3/uL (ref 1.7–7.7)
Neutrophils Relative %: 67 %
Platelets: 289 10*3/uL (ref 150–400)
RBC: 3.39 MIL/uL — ABNORMAL LOW (ref 4.22–5.81)
RDW: 22.5 % — ABNORMAL HIGH (ref 11.5–15.5)
WBC: 11 10*3/uL — ABNORMAL HIGH (ref 4.0–10.5)

## 2017-05-01 LAB — MAGNESIUM: MAGNESIUM: 1.5 mg/dL — AB (ref 1.7–2.4)

## 2017-05-01 MED ORDER — PRO-STAT SUGAR FREE PO LIQD
30.0000 mL | Freq: Every day | ORAL | Status: DC
  Administered 2017-05-01 – 2017-05-08 (×7): 30 mL
  Administered 2017-05-09: 09:00:00
  Administered 2017-05-10 – 2017-05-15 (×6): 30 mL
  Filled 2017-05-01 (×13): qty 30

## 2017-05-01 MED ORDER — MAGNESIUM SULFATE 4 GM/100ML IV SOLN
4.0000 g | Freq: Once | INTRAVENOUS | Status: AC
  Administered 2017-05-01: 4 g via INTRAVENOUS
  Filled 2017-05-01 (×2): qty 100

## 2017-05-01 MED ORDER — FENTANYL CITRATE (PF) 100 MCG/2ML IJ SOLN
25.0000 ug | INTRAMUSCULAR | Status: DC | PRN
  Administered 2017-05-01 – 2017-05-02 (×5): 50 ug via INTRAVENOUS
  Administered 2017-05-03: 25 ug via INTRAVENOUS
  Administered 2017-05-03: 50 ug via INTRAVENOUS
  Administered 2017-05-04 – 2017-05-05 (×2): 25 ug via INTRAVENOUS
  Filled 2017-05-01 (×10): qty 2

## 2017-05-01 MED ORDER — DOCUSATE SODIUM 50 MG/5ML PO LIQD
100.0000 mg | Freq: Two times a day (BID) | ORAL | Status: DC
  Administered 2017-05-01 – 2017-05-15 (×19): 100 mg
  Filled 2017-05-01 (×21): qty 10

## 2017-05-01 MED ORDER — VITAL AF 1.2 CAL PO LIQD
1000.0000 mL | ORAL | Status: DC
  Administered 2017-05-01 – 2017-05-03 (×3): 1000 mL

## 2017-05-01 NOTE — Progress Notes (Signed)
[place] Pulmonary & Critical Care Attending Note  ADMISSION DATE:  04/25/2017  CONSULTATION DATE:  04/25/2017  REFERRING MD:  Thomasene Lot  CHIEF COMPLAINT:  Altered Mental Status  Presenting HPI:  76 y.o. male w/ h/o but not limited to CAD, stroke with residual left-sided weakness, right PCA infarct with possible left-sided visual field cuts, DM, dementia.  He presented [facility] on 7/25 with AMS and as a code stroke. Apparently he had left-sided deficits per AMS. One day prior, he had a hypoglycemic event and required treatment by EMS; however, he was not transferred to hospital. Upon arrival to ED, he had a focal seizure. Neurology evaluated him at bedside and obtain CT of the head. Immediately following CT, he had left facial twitching for which he received Ativan followed by Keppra as well as Dilantin. He developed  Complete heart block and associated shock. Subsequently intubated for airway protection. Prior to intubation, there was concern for aspiration event.  He was evaluated by cardiology for possible pacemaker needs.  Subjective:  No acute events overnight. Patient with increasing endotracheal secretions.   Review of Systems:  Unable to obtain given intubation.   Vent Mode: PRVC FiO2 (%):  [40 %] 40 % Set Rate:  [16 bmp] 16 bmp Vt Set:  [500 mL] 500 mL PEEP:  [5 cmH20] 5 cmH20 Pressure Support:  [8 cmH20] 8 cmH20 Plateau Pressure:  [0 cmH20-14 cmH20] 13 cmH20  Temp:  [98.2 F (36.8 C)-99.4 F (37.4 C)] 98.2 F (36.8 C) (07/31 0400) Pulse Rate:  [72-95] 72 (07/31 0700) Resp:  [16-24] 16 (07/31 0700) BP: (106-137)/(58-82) 109/58 (07/31 0700) SpO2:  [93 %-100 %] 98 % (07/31 0900) FiO2 (%):  [40 %] 40 % (07/31 0900) Weight:  [149 lb 0.5 oz (67.6 kg)] 149 lb 0.5 oz (67.6 kg) (07/31 0500)  General:  No family at bedside. No distress.  Integument:  Warm & dry. No rash on exposed skin.  Extremities:  No cyanosis or clubbing.  HEENT:  Moist mucus membranes. Endotracheal tube in place.  No scleral icterus. Cardiovascular:  Regular rate. No appreciable JVD.  Pulmonary:  Clear breath sounds. Tan to opaque secretions. Symmetric chest wall expansion on ventilator. Abdomen: Soft. Normal bowel sounds. Nondistended.  Neurological: Patient moving right arm/hand on command and wiggling toes on command. Nods slowly to questions.  LINES/TUBES: OETT 7/25 >>> OGT 7/25 >>> PIV  CBC Latest Ref Rng & Units 05/01/2017 04/29/2017 04/28/2017  WBC 4.0 - 10.5 K/uL 11.0(H) 10.5 10.7(H)  Hemoglobin 13.0 - 17.0 g/dL 7.6(L) 8.0(L) 7.8(L)  Hematocrit 39.0 - 52.0 % 24.5(L) 26.0(L) 26.4(L)  Platelets 150 - 400 K/uL 289 196 191    BMP Latest Ref Rng & Units 05/01/2017 04/30/2017 04/29/2017  Glucose 65 - 99 mg/dL 185(H) 164(H) 121(H)  BUN 6 - 20 mg/dL _0 Creatinine 0.61 - 1.24 mg/dL 0.86 0.89 0.92  Sodium 135 - 145 mmol/L 129(L) 132(L) 135  Potassium 3.5 - 5.1 mmol/L 3.5 3.6 5.3(H)  Chloride 101 - 111 mmol/L 93(L) 94(L) 102  CO2 22 - 32 mmol/L _1 Calcium 8.9 - 10.3 mg/dL 8.1(L) 7.9(L) 7.8(L)    Hepatic Function Latest Ref Rng & Units 05/01/2017 04/25/2017 05/10/2016  Total Protein 6.5 - 8.1 g/dL - 7.4 6.0(L)  Albumin 3.5 - 5.0 g/dL 1.5(L) 2.1(L) 2.8(L)  AST 15 - 41 U/L - 83(H) 31  ALT 17 - 63 U/L - 37 20  Alk Phosphatase 38 - 126 U/L - 118 64  Total Bilirubin 0.3 -  1.2 mg/dL - 0.8 0.5  Bilirubin, Direct 0.0 - 0.3 mg/dL - - -    IMAGING/STUDIES: UDS 7/26:  Positive Benzos TTE 7/26:  LV normal in size with moderate LVH & focal basal septal hypertrophy. EF 35-40% with diffuse hypokinesis. LA mild to moderately dilated & RA normal in size. RV normal in size and function. No aortic stenosis or regurgitation. Aortic root normal in size. Mild to moderate mitral regurgitation without stenosis. No pulmonic stenosis. Mild tricuspid regurgitation. No pericardial effusion. MRI BRAIN W/O 7/26:  Moderate atrophy. There is advanced atrophy of the hippocampus bilaterally which may be due to  Alzheimer's disease. Extensive chronic ischemic change as above. No acute infarct. Numerous areas of chronic microhemorrhage the brain likely related to hypertension. EEG 7/27:  The EEG is abnormal and findings are suggestive of Moderate Generalized cerebral dysfunction. Epileptiform activity was not seen during this recording. PORT CXR 7/30:  Previously reviewed by me. Enteric feeding tube coursing low diaphragm & endotracheal tube in good position. Persistent silhouetting of left hemidiaphragm with left lower lung opacity as well as patchy right mid to lower lung opacification. Improved aeration of right lower lung compared with previous imaging.  MICROBIOLOGY: MRSA PCR 7/26:  Negative   ANTIBIOTICS: None.  SIGNIFICANT EVENTS: 07/25 - Admit & weaned of Levophed after developing complete heart block following 1 dose of Dilantin.  ASSESSMENT/PLAN:  76 y.o. man with acute encephalopathy secondary to seizures. Encephalopathy slowly improving. Increased secretions today but no fever or significant leukocytosis to indicate infection.  1. Acute encephalopathy: Improving. Secondary to seizures. Minimizing sedation. 2. Acute hypoxic respiratory failure: Checking portable chest x-ray given increased secretions. Continuing pressure support wean and daily spontaneous breathing trial. 3. Seizures: None seen on EEG from 7/27. Neurology has signed off. Continuing Keppra. No further Dilantin. Continuing seizure precautions. 4. Complete heart block: Resolved. Previously evaluated by cardiology. Secondary to Dilantin. Monitoring patient on telemetry. 5. Shock: Cardiogenic in etiology. Resolved. 6. Atrial fibrillation: Continuing to monitor patient on telemetry. Continuing diltiazem scheduled. 7. History of essential hypertension: Currently normotensive. Monitoring vital signs per unit protocol. 8. History of coronary artery disease: Continuing to monitor the patient on telemetry. 9. History of systolic  congestive heart failure: Likely will need diuresis prior to extubation. Holding on Lasix for now. No signs of acute decompensation. 10. Acute renal failure: Resolved. 11. Hypomagnesemia: Magnesium sulfate 4 g IV. Repeat magnesium level with a.m. labs. 12. Hypophosphatemia: Resolved. 13. Protein-calorie malnutrition: Continuing tube feeds per dietary recommendations. 14. Chronic anemia: Hemoglobin stable. No active bleeding. Trending cell counts daily with CBC. 15. Diabetes mellitus: Glucose controlled. Continuing Accu-Cheks every 4 hours with sliding scale insulin per algorithm.  16. Constipation: Continuing senna twice a day. Starting Colace twice daily via tube.  Prophylaxis:  Heparin [place] q8hr, SCDs, & Protonix via tube daily. Diet:  NPO. Continuing tube feedings.  Code Status:  Full Code per previous physician discussions. Disposition:  Remains critically ill in the intensive care unit. May require one way extubation versus tracheostomy placement. Family Update: Daughter updated by phone on 7/30 by Dr. .  I have spent a total of 31 minutes of critical care time today caring for the patient and reviewing the patient's electronic medical record.    E. , M.D. [place] [facility] Pager:  [phone] After 3pm or if no response, call [phone] 9:02 AM 05/01/17       

## 2017-05-01 NOTE — Progress Notes (Addendum)
Nutrition Follow-up  DOCUMENTATION CODES:   Not applicable  INTERVENTION:  - Will change TF regimen: Vital AF 1.2 @ 55 mL/hr with 30 mL Prostat once/day which will provide 1684 kcal (102% estimated kcal need), 114 grams of protein, and 1071 mL free water.  - Free water flush to be per MD/NP discretion. - PEPuP protocol.   NUTRITION DIAGNOSIS:   Inadequate oral intake related to acute illness as evidenced by NPO status. -ongoing  GOAL:   Patient will meet greater than or equal to 90% of their needs -meeting with TF  MONITOR:   TF tolerance, Vent status, Labs, Weight trends  ASSESSMENT:    76 yo male admitted with VDRF due to inability to protect airway in setting of seizures and AMS, possible aspiration during intubation. Pt with hx of dementia, DM, old CVA, HTN, ulcerative esophagitis  7/31 Pt remains intubated with OGT in place. Estimated kcal need updated this AM and based on admission weight (63.6 kg) as weight +4kg since that time. Will adjust TF regimen as outlined above. Pt currently receiving Vital High Protein @ 60 mL/hr with order for 200 mL free water every 4 hours. This regimen provides 1440 kcal, 126 grams of protein, and 2404 mL free water. Free water flush was added by PCCM NP on 7/27. Physical assessment shows no muscle or fat wasting, mild BLE edema.   Since last RD assessment, pt has had MRI and repeat EEGs. Neurology signed off following EEG 7/27 showing no seizure activity. No BM since 7/27.  Patient is currently intubated on ventilator support MV: 8.6 L/min Temp (24hrs), Avg:98.6 F (37 C), Min:98.2 F (36.8 C), Max:99.4 F (37.4 C) BP: 127/68 and MAP:87  Medications reviewed; 100 mg Colace BID per OGT, sliding scale Novolog, 4 g IV Mg sulfate x1 run today, 40 mg Protonix/day per OGT, 5 mL Senokot BID per OGT.  Labs reviewed; CBGs: 172 and 193 mg/dL this AM, Na: 129 mmol/L, Cl: 93 mmol/L, Ca: 8.1 mg/dL, Mg: 1.5 mg/dL.  IVF: NS @ 75 mL/hr.      7/26 Patient is currently intubated on ventilator support with OGT in place MV: 7.6 L/min Temp (24hrs), Avg:97.2 F (36.2 C), Min:96.1 F (35.6 C), Max:97.7 F (36.5 C)  Unable to complete Nutrition-Focused physical exam at this time.     Diet Order:  Diet NPO time specified  Skin:  Reviewed, no issues  Last BM:  7/27  Height:   Ht Readings from Last 1 Encounters:  04/26/17 6' (1.829 m)    Weight:   Wt Readings from Last 1 Encounters:  05/01/17 149 lb 0.5 oz (67.6 kg)    Ideal Body Weight:  80.9 kg  BMI:  Body mass index is 20.21 kg/m.  Estimated Nutritional Needs:   Kcal:  1655  Protein:  95-127 g  Fluid:  >/= 1.6 L  EDUCATION NEEDS:   No education needs identified at this time    Jarome Matin, MS, RD, LDN, CNSC Inpatient Clinical Dietitian Pager # [phone] After hours/weekend pager # [phone]

## 2017-05-01 NOTE — Care Management Note (Signed)
Case Management Note  Patient Details  Name: Daveyon Kitchings. MRN: [id number] Date of Birth: January 08, 1941  Subjective/Objective:  Pt admitted on 04/25/17 with seizure activity and VDRF.  PTA, pt resided at home with son and daughter in law.                   Action/Plan: Pt currently remains intubated; will follow for discharge planning as pt progresses.    Expected Discharge Date:                  Expected Discharge Plan:     In-House Referral:     Discharge planning Services  CM Consult  Post Acute Care Choice:    Choice offered to:     DME Arranged:    DME Agency:     HH Arranged:    HH Agency:     Status of Service:  In process, will continue to follow  If discussed at Long Length of Stay Meetings, dates discussed:    Additional Comments:  05/01/17 J. Dejai Schubach, RN, BSN Pt remains intubated; he is currently off sedation.  Bedside nurse reports that pt is weaning, following commands, and is more alert than yesterday.  Hopeful for extubation soon.  Will follow progress.    Reinaldo Raddle, RN, BSN  Trauma/Neuro ICU Case Manager [phone]

## 2017-05-02 LAB — RENAL FUNCTION PANEL
ALBUMIN: 1.5 g/dL — AB (ref 3.5–5.0)
Anion gap: 4 — ABNORMAL LOW (ref 5–15)
BUN: 11 mg/dL (ref 6–20)
CHLORIDE: 96 mmol/L — AB (ref 101–111)
CO2: 31 mmol/L (ref 22–32)
Calcium: 8.1 mg/dL — ABNORMAL LOW (ref 8.9–10.3)
Creatinine, Ser: 0.76 mg/dL (ref 0.61–1.24)
GFR calc Af Amer: >60 mL/min (ref >60)
GFR calc non Af Amer: >60 mL/min (ref >60)
GLUCOSE: 140 mg/dL — AB (ref 65–99)
PHOSPHORUS: 2.9 mg/dL (ref 2.5–4.6)
POTASSIUM: 3.6 mmol/L (ref 3.5–5.1)
Sodium: 131 mmol/L — ABNORMAL LOW (ref 135–145)

## 2017-05-02 LAB — CBC WITH DIFFERENTIAL/PLATELET
BASOS PCT: 0 %
Basophils Absolute: 0 10*3/uL (ref 0.0–0.1)
EOS PCT: 4 %
Eosinophils Absolute: 0.4 10*3/uL (ref 0.0–0.7)
HEMATOCRIT: 25.5 % — AB (ref 39.0–52.0)
Hemoglobin: 7.7 g/dL — ABNORMAL LOW (ref 13.0–17.0)
LYMPHS ABS: 1.9 10*3/uL (ref 0.7–4.0)
Lymphocytes Relative: 20 %
MCH: 22.1 pg — AB (ref 26.0–34.0)
MCHC: 30.2 g/dL (ref 30.0–36.0)
MCV: 73.3 fL — AB (ref 78.0–100.0)
MONOS PCT: 9 %
Monocytes Absolute: 0.8 10*3/uL (ref 0.1–1.0)
NEUTROS ABS: 6.2 10*3/uL (ref 1.7–7.7)
Neutrophils Relative %: 67 %
PLATELETS: 320 10*3/uL (ref 150–400)
RBC: 3.48 MIL/uL — ABNORMAL LOW (ref 4.22–5.81)
RDW: 22.9 % — AB (ref 11.5–15.5)
WBC: 9.3 10*3/uL (ref 4.0–10.5)

## 2017-05-02 LAB — MAGNESIUM: Magnesium: 2 mg/dL (ref 1.7–2.4)

## 2017-05-02 LAB — GLUCOSE, CAPILLARY
GLUCOSE-CAPILLARY: 166 mg/dL — AB (ref 65–99)
Glucose-Capillary: 168 mg/dL — ABNORMAL HIGH (ref 65–99)
Glucose-Capillary: 130 mg/dL — ABNORMAL HIGH (ref 65–99)
Glucose-Capillary: 132 mg/dL — ABNORMAL HIGH (ref 65–99)
Glucose-Capillary: 163 mg/dL — ABNORMAL HIGH (ref 65–99)
Glucose-Capillary: 140 mg/dL — ABNORMAL HIGH (ref 65–99)

## 2017-05-02 MED ORDER — POTASSIUM CHLORIDE CRYS ER 20 MEQ PO TBCR
40.0000 meq | EXTENDED_RELEASE_TABLET | Freq: Once | ORAL | Status: AC
  Administered 2017-05-02: 40 meq via ORAL
  Filled 2017-05-02: qty 2

## 2017-05-02 MED ORDER — FUROSEMIDE 10 MG/ML IJ SOLN
40.0000 mg | Freq: Two times a day (BID) | INTRAMUSCULAR | Status: DC
  Administered 2017-05-02 – 2017-05-05 (×7): 40 mg via INTRAVENOUS
  Filled 2017-05-02 (×7): qty 4

## 2017-05-02 MED ORDER — FREE WATER: 200.0000 mL | Freq: Three times a day (TID) | Status: DC

## 2017-05-02 NOTE — Progress Notes (Signed)
[facility] Attending Note  ADMISSION DATE:  04/25/2017  CONSULTATION DATE:  04/25/2017  REFERRING MD:  Mackuen  CHIEF COMPLAINT:  Altered Mental Status  Presenting HPI:  76 y.o. male w/ h/o but not limited to CAD, stroke with residual left-sided weakness, right PCA infarct with possible left-sided visual field cuts, DM, dementia.  He presented [facility] on 7/25 with AMS and as a code stroke. Apparently he had left-sided deficits per AMS. One day prior, he had a hypoglycemic event and required treatment by EMS; however, he was not transferred to hospital. Upon arrival to ED, he had a focal seizure. Neurology evaluated him at bedside and obtain CT of the head. Immediately following CT, he had left facial twitching for which he received Ativan followed by Keppra as well as Dilantin. He developed  Complete heart block and associated shock. Subsequently intubated for airway protection. Prior to intubation, there was concern for aspiration event.  He was evaluated by cardiology for possible pacemaker needs.  Subjective:  No acute events overnight. Patient with increasing endotracheal secretions.   Review of Systems:  Unable to obtain given intubation.   Vent Mode: PSV;CPAP FiO2 (%):  [40 %] 40 % Set Rate:  [16 bmp] 16 bmp Vt Set:  [500 mL] 500 mL PEEP:  [5 cmH20] 5 cmH20 Pressure Support:  [8 cmH20] 8 cmH20 Plateau Pressure:  [13 cmH20-16 cmH20] 16 cmH20  Temp:  [98.1 F (36.7 C)-99.1 F (37.3 C)] 98.2 F (36.8 C) (08/01 0400) Pulse Rate:  [63-121] 63 (08/01 0740) Resp:  [14-32] 16 (08/01 0740) BP: (108-153)/(53-75) 118/64 (08/01 0740) SpO2:  [96 %-100 %] 99 % (08/01 0740) FiO2 (%):  [40 %] 40 % (08/01 0740) Weight:  [69.2 kg (152 lb 8.9 oz)] 69.2 kg (152 lb 8.9 oz) (08/01 0500)  General:  No family at bedside. No distress.  Integument:  Warm & dry. No rash on exposed skin.  Extremities:  No cyanosis or clubbing.  HEENT:  Moist mucus membranes. Endotracheal tube in  place. No scleral icterus. Cardiovascular:  Regular rate. No appreciable JVD.  Pulmonary:  Clear breath sounds. Tan to opaque secretions. Symmetric chest wall expansion on ventilator. Abdomen: Soft. Normal bowel sounds. Nondistended.  Neurological: Patient moving right arm/hand on command and wiggling toes on command. Nods slowly to questions.  LINES/TUBES: OETT 7/25 >>> OGT 7/25 >>> PIV  CBC Latest Ref Rng & Units 05/02/2017 05/01/2017 04/29/2017  WBC 4.0 - 10.5 K/uL 9.3 11.0(H) 10.5  Hemoglobin 13.0 - 17.0 g/dL 7.7(L) 7.6(L) 8.0(L)  Hematocrit 39.0 - 52.0 % 25.5(L) 24.5(L) 26.0(L)  Platelets 150 - 400 K/uL 320 289 196    BMP Latest Ref Rng & Units 05/02/2017 05/01/2017 04/30/2017  Glucose 65 - 99 mg/dL 140(H) 185(H) 164(H)  BUN 6 - 20 mg/dL 11 13 12  Creatinine 0.61 - 1.24 mg/dL 0.76 0.86 0.89  Sodium 135 - 145 mmol/L 131(L) 129(L) 132(L)  Potassium 3.5 - 5.1 mmol/L 3.6 3.5 3.6  Chloride 101 - 111 mmol/L 96(L) 93(L) 94(L)  CO2 22 - 32 mmol/L 31 31 30  Calcium 8.9 - 10.3 mg/dL 8.1(L) 8.1(L) 7.9(L)    Hepatic Function Latest Ref Rng & Units 05/02/2017 05/01/2017 04/25/2017  Total Protein 6.5 - 8.1 g/dL - - 7.4  Albumin 3.5 - 5.0 g/dL 1.5(L) 1.5(L) 2.1(L)  AST 15 - 41 U/L - - 83(H)  ALT 17 - 63 U/L - - 37  Alk Phosphatase 38 - 126 U/L - - 118  Total Bilirubin 0.3 -   1.2 mg/dL - - 0.8  Bilirubin, Direct 0.0 - 0.3 mg/dL - - -    IMAGING/STUDIES: UDS 7/26:  Positive Benzos TTE 7/26:  LV normal in size with moderate LVH & focal basal septal hypertrophy. EF 35-40% with diffuse hypokinesis. LA mild to moderately dilated & RA normal in size. RV normal in size and function. No aortic stenosis or regurgitation. Aortic root normal in size. Mild to moderate mitral regurgitation without stenosis. No pulmonic stenosis. Mild tricuspid regurgitation. No pericardial effusion. MRI BRAIN W/O 7/26:  Moderate atrophy. There is advanced atrophy of the hippocampus bilaterally which may be due to Alzheimer's  disease. Extensive chronic ischemic change as above. No acute infarct. Numerous areas of chronic microhemorrhage the brain likely related to hypertension. EEG 7/27:  The EEG is abnormal and findings are suggestive of Moderate Generalized cerebral dysfunction. Epileptiform activity was not seen during this recording. PORT CXR 7/30:  Previously reviewed by me. Enteric feeding tube coursing low diaphragm & endotracheal tube in good position. Persistent silhouetting of left hemidiaphragm with left lower lung opacity as well as patchy right mid to lower lung opacification. Improved aeration of right lower lung compared with previous imaging.  MICROBIOLOGY: MRSA PCR 7/26:  Negative   ANTIBIOTICS: None.  SIGNIFICANT EVENTS: 07/25 - Admit & weaned of Levophed after developing complete heart block following 1 dose of Dilantin.  ASSESSMENT/PLAN:  76 y.o. man with acute encephalopathy secondary to seizures. Developed CHB due to dilantin, which has resolved. Encephalopathy slowly improving. Secretions seem to have improved some. Doing well on PS wean will hope for extubation today.   PULMONARY A: Acute hypoxic respiratory failure: Secondary encephalopathy Increased secretions  P:   Vent support Pressure support wean daily Hopeful for extubation today. Chest pt and hypertonic nebs if able to extubate.   CARDIOVASCULAR A:  Complete heart block: likely secondary to Dilantin. Resolved Shock: in setting of CHB. resolved Atrial fibrillation: now sinus Chronic systolic CHF H/o CAD, HTN P:  Telemetry Scheduled diltiazem  RENAL A:   AKI: improved Hypomag Hypophos  P:   Repeat BMP in AM  GASTROINTESTINAL A:   Protein calorie malnutrition.  Constipation  P:   Conitnue TF Senna BID  HEMATOLOGIC A:   Chronic anemia.   P:  SQ heparin Follow CBC  INFECTIOUS A:   No acute issues  P:    ENDOCRINE A:   DM   P:   CBG monitoring and SSI  NEUROLOGIC A:   Acute  encephalopathy: Secondary to seizures.  Seizures: none on EEG 7/27  P:   RASS goal: 0 to -1 Minimizing sedation Neuro signed off Continue Keppra No further Dilantin! Seizure precautions.   FAMILY  - Updates: No family present this AM. Attempted to call with directly to voicemail.  - Inter-disciplinary family meet or Palliative Care meeting due by:  8/1    Georgann Housekeeper, AGACNP-BC [facility] Pager [phone] or [phone]  05/02/2017 9:06 AM

## 2017-05-03 ENCOUNTER — Inpatient Hospital Stay (HOSPITAL_COMMUNITY): Payer: Medicare Other

## 2017-05-03 LAB — RENAL FUNCTION PANEL
Albumin: 1.7 g/dL — ABNORMAL LOW (ref 3.5–5.0)
Anion gap: 5 (ref 5–15)
BUN: 14 mg/dL (ref 6–20)
CHLORIDE: 97 mmol/L — AB (ref 101–111)
CO2: 33 mmol/L — AB (ref 22–32)
CREATININE: 0.83 mg/dL (ref 0.61–1.24)
Calcium: 8.4 mg/dL — ABNORMAL LOW (ref 8.9–10.3)
GFR calc non Af Amer: >60 mL/min (ref >60)
Glucose, Bld: 137 mg/dL — ABNORMAL HIGH (ref 65–99)
POTASSIUM: 3.6 mmol/L (ref 3.5–5.1)
Phosphorus: 3.4 mg/dL (ref 2.5–4.6)
Sodium: 135 mmol/L (ref 135–145)

## 2017-05-03 LAB — CBC WITH DIFFERENTIAL/PLATELET
BASOS PCT: 0 %
Basophils Absolute: 0 10*3/uL (ref 0.0–0.1)
EOS PCT: 2 %
Eosinophils Absolute: 0.2 10*3/uL (ref 0.0–0.7)
HCT: 26 % — ABNORMAL LOW (ref 39.0–52.0)
Hemoglobin: 7.5 g/dL — ABNORMAL LOW (ref 13.0–17.0)
Lymphocytes Relative: 15 %
Lymphs Abs: 1.7 10*3/uL (ref 0.7–4.0)
MCH: 21.6 pg — ABNORMAL LOW (ref 26.0–34.0)
MCHC: 28.8 g/dL — ABNORMAL LOW (ref 30.0–36.0)
MCV: 74.9 fL — ABNORMAL LOW (ref 78.0–100.0)
MONO ABS: 1.2 10*3/uL — AB (ref 0.1–1.0)
MONOS PCT: 10 %
NEUTROS PCT: 73 %
Neutro Abs: 8.4 10*3/uL — ABNORMAL HIGH (ref 1.7–7.7)
PLATELETS: 401 10*3/uL — AB (ref 150–400)
RBC: 3.47 MIL/uL — AB (ref 4.22–5.81)
RDW: 23.3 % — AB (ref 11.5–15.5)
WBC: 11.5 10*3/uL — ABNORMAL HIGH (ref 4.0–10.5)

## 2017-05-03 LAB — GLUCOSE, CAPILLARY
GLUCOSE-CAPILLARY: 145 mg/dL — AB (ref 65–99)
GLUCOSE-CAPILLARY: 116 mg/dL — AB (ref 65–99)
GLUCOSE-CAPILLARY: 150 mg/dL — AB (ref 65–99)
GLUCOSE-CAPILLARY: 132 mg/dL — AB (ref 65–99)
GLUCOSE-CAPILLARY: 45 mg/dL — AB (ref 65–99)
GLUCOSE-CAPILLARY: 51 mg/dL — AB (ref 65–99)
Glucose-Capillary: 117 mg/dL — ABNORMAL HIGH (ref 65–99)
Glucose-Capillary: 223 mg/dL — ABNORMAL HIGH (ref 65–99)

## 2017-05-03 LAB — BASIC METABOLIC PANEL
ANION GAP: 6 (ref 5–15)
BUN: 15 mg/dL (ref 6–20)
CALCIUM: 9 mg/dL (ref 8.9–10.3)
CO2: 34 mmol/L — ABNORMAL HIGH (ref 22–32)
Chloride: 96 mmol/L — ABNORMAL LOW (ref 101–111)
Creatinine, Ser: 0.83 mg/dL (ref 0.61–1.24)
GFR calc non Af Amer: >60 mL/min (ref >60)
Glucose, Bld: 63 mg/dL — ABNORMAL LOW (ref 65–99)
POTASSIUM: 3.8 mmol/L (ref 3.5–5.1)
Sodium: 136 mmol/L (ref 135–145)

## 2017-05-03 LAB — MAGNESIUM
MAGNESIUM: 1.9 mg/dL (ref 1.7–2.4)
MAGNESIUM: 2.3 mg/dL (ref 1.7–2.4)

## 2017-05-03 MED ORDER — METOPROLOL TARTRATE 5 MG/5ML IV SOLN
2.5000 mg | Freq: Four times a day (QID) | INTRAVENOUS | Status: DC
  Administered 2017-05-03 – 2017-05-07 (×14): 2.5 mg via INTRAVENOUS
  Filled 2017-05-03 (×13): qty 5

## 2017-05-03 MED ORDER — DEXTROSE 50 % IV SOLN
25.0000 mL | Freq: Once | INTRAVENOUS | Status: AC
Start: 2017-05-03 — End: 2017-05-03
  Administered 2017-05-03: 25 mL via INTRAVENOUS

## 2017-05-03 MED ORDER — FUROSEMIDE 10 MG/ML IJ SOLN
40.0000 mg | Freq: Once | INTRAMUSCULAR | Status: AC
  Administered 2017-05-03: 40 mg via INTRAVENOUS
  Filled 2017-05-03: qty 4

## 2017-05-03 MED ORDER — METOPROLOL TARTRATE 5 MG/5ML IV SOLN: 2.5000 mg | Freq: Four times a day (QID) | INTRAVENOUS | Status: DC

## 2017-05-03 MED ORDER — DEXAMETHASONE SODIUM PHOSPHATE 10 MG/ML IJ SOLN
4.0000 mg | Freq: Four times a day (QID) | INTRAMUSCULAR | Status: DC
  Administered 2017-05-03 – 2017-05-08 (×19): 4 mg via INTRAVENOUS
  Filled 2017-05-03 (×20): qty 1

## 2017-05-03 MED ORDER — RACEPINEPHRINE HCL 2.25 % IN NEBU
INHALATION_SOLUTION | RESPIRATORY_TRACT | Status: AC
  Administered 2017-05-03: 16:00:00
  Filled 2017-05-03: qty 0.5

## 2017-05-03 MED ORDER — IPRATROPIUM-ALBUTEROL 0.5-2.5 (3) MG/3ML IN SOLN
3.0000 mL | Freq: Four times a day (QID) | RESPIRATORY_TRACT | Status: DC
  Administered 2017-05-04 – 2017-05-05 (×5): 3 mL via RESPIRATORY_TRACT
  Filled 2017-05-03 (×5): qty 3

## 2017-05-03 MED ORDER — MAGNESIUM SULFATE 2 GM/50ML IV SOLN
2.0000 g | Freq: Once | INTRAVENOUS | Status: AC
  Administered 2017-05-03: 2 g via INTRAVENOUS
  Filled 2017-05-03: qty 50

## 2017-05-03 MED ORDER — METOPROLOL TARTRATE 5 MG/5ML IV SOLN
INTRAVENOUS | Status: AC
  Filled 2017-05-03: qty 5

## 2017-05-03 MED ORDER — DEXTROSE 50 % IV SOLN
INTRAVENOUS | Status: AC
  Filled 2017-05-03: qty 50

## 2017-05-03 NOTE — Progress Notes (Signed)
[facility] Progress Note Patient Name: Alanmichael Barmore. DOB: 01/21/1941 MRN: [id number]   Date of Service  05/03/2017  HPI/Events of Note  Patient with underlying atrial fibrillation. On diltiazem via tube previously. Self extubated today. On BiPAP. Telemetry shows heart rate ranging from normal to the 120s. Currently undergoing diuresis with Lasix IV. Potassium 3.6 & magnesium 1.9 this morning.   eICU Interventions  1. Checking stat BMP & magnesium 2. Lopressor 2.5 mg IV every 6 hours with hold parameters 3. Continuing telemetry monitoring      Intervention Category Major Interventions: Arrhythmia - evaluation and management  Tera Partridge 05/03/2017, 9:55 PM

## 2017-05-03 NOTE — Progress Notes (Signed)
[facility] PCCM   ADMISSION DATE:  04/25/2017  CONSULTATION DATE:  04/25/2017  REFERRING MD:  Thomasene Lot  CHIEF COMPLAINT:  Altered Mental Status  Presenting HPI:  76 y.o. male w/ h/o but not limited to CAD, stroke with residual left-sided weakness, right PCA infarct with possible left-sided visual field cuts, DM, dementia.  He presented [facility] on 7/25 with AMS and as a code stroke. Apparently he had left-sided deficits per AMS. One day prior, he had a hypoglycemic event and required treatment by EMS; however, he was not transferred to hospital. Upon arrival to ED, he had a focal seizure. Neurology evaluated him at bedside and obtain CT of the head. Immediately following CT, he had left facial twitching for which he received Ativan followed by Keppra as well as Dilantin. He developed  Complete heart block and associated shock. Subsequently intubated for airway protection. Prior to intubation, there was concern for aspiration event.  He was evaluated by cardiology for possible pacemaker needs.  Subjective:  No acute events overnight. Copious thick secretions.  Tolerating PS 8/5.  Per RN family was at bedside this am, upset that he was not extubated.   Vent Mode: CPAP;PSV FiO2 (%):  [40 %] 40 % Set Rate:  [16 bmp] 16 bmp Vt Set:  [500 mL] 500 mL PEEP:  [5 cmH20] 5 cmH20 Pressure Support:  [5 UKR83-8 cmH20] 8 cmH20 Plateau Pressure:  [10 FMM03-75 cmH20] 12 cmH20  Temp:  [97.9 F (36.6 C)-98.8 F (37.1 C)] 98.2 F (36.8 C) (08/02 0900) Pulse Rate:  [70-96] 81 (08/02 1000) Resp:  [15-39] 18 (08/02 1000) BP: (103-152)/(52-81) 121/63 (08/02 1000) SpO2:  [96 %-100 %] 98 % (08/02 1000) FiO2 (%):  [40 %] 40 % (08/02 0851) Weight:  [68 kg (149 lb 14.6 oz)] 68 kg (149 lb 14.6 oz) (08/02 0442)  General:  No family at bedside. No distress.  Integument:  Warm & dry. No rash on exposed skin.  Extremities:  No cyanosis or clubbing.  HEENT:  Moist mucus membranes. Endotracheal tube in place. No scleral  icterus. Cardiovascular:  Regular rate. No appreciable JVD.  Pulmonary:  resps even non labored on PS 8/5, scattered rhonchi, thick tan secretions.  Abdomen: Soft. Normal bowel sounds. Nondistended.  Neurological: opens eyes, moves spontaneously, does not follow commands for me.    LINES/TUBES: OETT 7/25 >>> OGT 7/25 >>> PIV  CBC Latest Ref Rng & Units 05/03/2017 05/02/2017 05/01/2017  WBC 4.0 - 10.5 K/uL 11.5(H) 9.3 11.0(H)  Hemoglobin 13.0 - 17.0 g/dL 7.5(L) 7.7(L) 7.6(L)  Hematocrit 39.0 - 52.0 % 26.0(L) 25.5(L) 24.5(L)  Platelets 150 - 400 K/uL 401(H) 320 289    BMP Latest Ref Rng & Units 05/03/2017 05/02/2017 05/01/2017  Glucose 65 - 99 mg/dL 137(H) 140(H) 185(H)  BUN 6 - 20 mg/dL '14 11 13  ' Creatinine 0.61 - 1.24 mg/dL 0.83 0.76 0.86  Sodium 135 - 145 mmol/L 135 131(L) 129(L)  Potassium 3.5 - 5.1 mmol/L 3.6 3.6 3.5  Chloride 101 - 111 mmol/L 97(L) 96(L) 93(L)  CO2 22 - 32 mmol/L 33(H) 31 31  Calcium 8.9 - 10.3 mg/dL 8.4(L) 8.1(L) 8.1(L)    Hepatic Function Latest Ref Rng & Units 05/03/2017 05/02/2017 05/01/2017  Total Protein 6.5 - 8.1 g/dL - - -  Albumin 3.5 - 5.0 g/dL 1.7(L) 1.5(L) 1.5(L)  AST 15 - 41 U/L - - -  ALT 17 - 63 U/L - - -  Alk Phosphatase 38 - 126 U/L - - -  Total Bilirubin 0.3 -  1.2 mg/dL - - -  Bilirubin, Direct 0.0 - 0.3 mg/dL - - -    IMAGING/STUDIES: UDS 7/26:  Positive Benzos TTE 7/26:  LV normal in size with moderate LVH & focal basal septal hypertrophy. EF 35-40% with diffuse hypokinesis. LA mild to moderately dilated & RA normal in size. RV normal in size and function. No aortic stenosis or regurgitation. Aortic root normal in size. Mild to moderate mitral regurgitation without stenosis. No pulmonic stenosis. Mild tricuspid regurgitation. No pericardial effusion. MRI BRAIN W/O 7/26:  Moderate atrophy. 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 Extensive chronic ischemic change as above. No acute infarct.  Numerous areas of chronic microhemorrhage the brain likely related to hypertension. EEG 7/27:  The EEG is abnormal and findings are suggestive of Moderate Generalized cerebral dysfunction. Epileptiform activity was not seen during this recording. PORT CXR 7/30:  Previously reviewed by me. Enteric feeding tube coursing low diaphragm & endotracheal tube in good position. Persistent silhouetting of left hemidiaphragm with left lower lung opacity as well as patchy right mid to lower lung opacification. Improved aeration of right lower lung compared with previous imaging.  MICROBIOLOGY: MRSA PCR 7/26:  Negative   ANTIBIOTICS: None.  SIGNIFICANT EVENTS: 07/25 - Admit & weaned of Levophed after developing complete heart block following 1 dose of Dilantin.  ASSESSMENT/PLAN:  76 y.o. man with acute encephalopathy secondary to seizures. Developed CHB due to dilantin, which has resolved. Encephalopathy slowly improving. Secretions seem to have improved some. Tolerating PS but mental status seems to be waxing/waning.   PULMONARY A: Acute hypoxic respiratory failure: Secondary encephalopathy Increased secretions  P:   Vent support - 8cc/kg  F/u CXR  F/u ABG Daily SBT  PS as tol  Would like for mental status to be a bit better for extubation    CARDIOVASCULAR A:  Complete heart block: likely secondary to Dilantin. Resolved Shock: in setting of CHB. resolved Atrial fibrillation: now sinus Chronic systolic CHF H/o CAD, HTN P:  Telemetry Scheduled diltiazem Lasix BID   RENAL A:   AKI: improved Hypomag Hypophos  P:   Repeat BMP in AM  GASTROINTESTINAL A:   Protein calorie malnutrition.  Constipation  P:   Conitnue TF Senna BID  HEMATOLOGIC A:   Chronic anemia.   P:  SQ heparin Follow CBC  INFECTIOUS A:   No acute issues  P:   Follow wbc, fever curve - monitor secretions   ENDOCRINE A:   DM   P:   CBG monitoring and SSI  NEUROLOGIC A:   Acute  encephalopathy: Secondary to seizures.  Seizures: none on EEG 7/27  P:   RASS goal: 0 to -1 Minimizing sedation Neuro signed off Continue Keppra No further Dilantin! Seizure precautions. Consider further imaging if mental status does not improve   FAMILY  - Updates: No family present this AM. Per RN came in early and were angry that he was not extubated.  No one at bedside on my rounds, will attempt to call.  They were difficult to reach yesterday.   - Inter-disciplinary family meet or Palliative Care meeting due by:  8/1  Nickolas Madrid, NP 05/03/2017  10:52 AM Pager: [phone] or [phone]

## 2017-05-03 NOTE — Progress Notes (Signed)
RT called to re-adjust ETAD holder for ETT and before I arrived to unit RN called back stating pt may have pulled his ETT out some.  Upon entering room, pt was alarming the vent and coughing.  RN was at bedside.  ETT was pulled out at 19 at the lip.  I deflated ETT and advanced it to 23 cm at the lip where it had previously been secured.  Pt initially looked to have received Vt set on vent, but pts sats were dropping into the 80s.  Pt suctioned via mouth and ETT for clear thick secretions.  Pt's sats continued to drop into upper 70s.  MD had already been called at this point and was on his way.  Pt taken off vent and bagged with 100% FIO2.  Ultimately, pts was not being ventilated and his ETT was pulled.  Pt was placed on 100% NRB  And sats came back to 100%.  Pt continued to have stridorous breath sounds.  Pt was given racemic epi neb and then placed on bipap per NP.  Pt is tolerating bipap well.  RR 22-25, sats 100%.  RT will continue to monitor.  Pt may have to be reintubated.

## 2017-05-03 NOTE — Progress Notes (Addendum)
Responded to ventilator alarm and found patient pulling on ETT tube. Called for assistance and had RT and MD paged to bedside. Patient pulled ETT tube out of place. O2 Sats began to drop and ETT tube pulled. Placed on NRB. MD and NP now at bedside. Orders for racemic epi and put on Bipap. Patient appears more comfortable. Sats 100%. Son at bedside. He spoke with NP. Son states he will call his sister and update her.

## 2017-05-03 NOTE — Plan of Care (Signed)
Problem: Physical Regulation: Goal: Diagnostic test results will improve Outcome: Completed/Met Date Met: 05/03/17 No seizures noted since admission.

## 2017-05-03 NOTE — Progress Notes (Signed)
   Self extubated  On exam post extubation Mild stridor with mild paradoxical respirato  STarted bipap and racemic epic and better  Plan Monitor on bipap (has possible copd on exam) Lasix x 1 extra dose  Dr. Brand Males, M.D., [facility].C.P Pulmonary and Critical Care Medicine Staff Physician [facility] Pager: [phone], If no answer or between  15:00h - 7:00h: call [phone]  05/03/2017 3:17 PM

## 2017-05-03 NOTE — Progress Notes (Signed)
90- RN notified by RT patient had audible stridor. Upon assessment, patient is in no distress, awake and alert. Audible stridor noted. PCCM notified. Will wait for new orders.  2010- Dr. Ashok Cordia assessed patient. Patient remains free of respiratory distress. New orders given and carried out. Will continue to monitor.  Lianne Bushy RN BSN.

## 2017-05-03 NOTE — Progress Notes (Signed)
[facility] Progress Note Patient Name: [name]. DOB: December 02, 1940 MRN: [id number]   Date of Service  05/03/2017  HPI/Events of Note  Contacted by nurse reporting patient is having some stridor. Normal work of breathing. Currently on BiPAP. Self extubated earlier. Previously received racemic epinephrine. Currently on scheduled Lasix with excellent diuresis.   eICU Interventions  1. Decadron 4 mg IV every 6 hours 2. Continue close monitoring for possible reintubation      Intervention Category Major Interventions: Respiratory failure - evaluation and management  Tera Partridge 05/03/2017, 8:08 PM

## 2017-05-04 ENCOUNTER — Inpatient Hospital Stay (HOSPITAL_COMMUNITY): Payer: Medicare Other

## 2017-05-04 LAB — GLUCOSE, CAPILLARY
Glucose-Capillary: 179 mg/dL — ABNORMAL HIGH (ref 65–99)
Glucose-Capillary: 216 mg/dL — ABNORMAL HIGH (ref 65–99)
Glucose-Capillary: 251 mg/dL — ABNORMAL HIGH (ref 65–99)
Glucose-Capillary: 184 mg/dL — ABNORMAL HIGH (ref 65–99)
Glucose-Capillary: 191 mg/dL — ABNORMAL HIGH (ref 65–99)
Glucose-Capillary: 196 mg/dL — ABNORMAL HIGH (ref 65–99)

## 2017-05-04 LAB — BASIC METABOLIC PANEL
ANION GAP: 9 (ref 5–15)
BUN: 16 mg/dL (ref 6–20)
CALCIUM: 9 mg/dL (ref 8.9–10.3)
CO2: 31 mmol/L (ref 22–32)
CREATININE: 0.86 mg/dL (ref 0.61–1.24)
Chloride: 97 mmol/L — ABNORMAL LOW (ref 101–111)
GFR calc Af Amer: >60 mL/min (ref >60)
GLUCOSE: 169 mg/dL — AB (ref 65–99)
Potassium: 4.3 mmol/L (ref 3.5–5.1)
Sodium: 137 mmol/L (ref 135–145)

## 2017-05-04 LAB — CBC
HCT: 30.8 % — ABNORMAL LOW (ref 39.0–52.0)
Hemoglobin: 8.9 g/dL — ABNORMAL LOW (ref 13.0–17.0)
MCH: 21.9 pg — ABNORMAL LOW (ref 26.0–34.0)
MCHC: 28.9 g/dL — AB (ref 30.0–36.0)
MCV: 75.7 fL — AB (ref 78.0–100.0)
PLATELETS: 507 10*3/uL — AB (ref 150–400)
RBC: 4.07 MIL/uL — ABNORMAL LOW (ref 4.22–5.81)
RDW: 23.4 % — AB (ref 11.5–15.5)
WBC: 15.8 10*3/uL — AB (ref 4.0–10.5)

## 2017-05-04 MED ORDER — INSULIN ASPART 100 UNIT/ML ~~LOC~~ SOLN
2.0000 [IU] | SUBCUTANEOUS | Status: DC
  Administered 2017-05-04 (×2): 4 [IU] via SUBCUTANEOUS
  Administered 2017-05-04: 6 [IU] via SUBCUTANEOUS
  Administered 2017-05-04: 4 [IU] via SUBCUTANEOUS
  Administered 2017-05-05: 2 [IU] via SUBCUTANEOUS
  Administered 2017-05-05 (×2): 4 [IU] via SUBCUTANEOUS

## 2017-05-04 MED ORDER — CHLORHEXIDINE GLUCONATE 0.12 % MT SOLN
15.0000 mL | Freq: Two times a day (BID) | OROMUCOSAL | Status: DC
  Administered 2017-05-04 – 2017-05-15 (×23): 15 mL via OROMUCOSAL
  Filled 2017-05-04 (×21): qty 15

## 2017-05-04 MED ORDER — BISACODYL 10 MG RE SUPP: 10.0000 mg | Freq: Every day | RECTAL | Status: DC | PRN

## 2017-05-04 MED ORDER — PANTOPRAZOLE SODIUM 40 MG IV SOLR
40.0000 mg | INTRAVENOUS | Status: DC
  Administered 2017-05-04 – 2017-05-08 (×5): 40 mg via INTRAVENOUS
  Filled 2017-05-04 (×5): qty 40

## 2017-05-04 MED ORDER — ORAL CARE MOUTH RINSE
15.0000 mL | Freq: Two times a day (BID) | OROMUCOSAL | Status: DC
  Administered 2017-05-04 – 2017-05-14 (×21): 15 mL via OROMUCOSAL

## 2017-05-04 NOTE — Progress Notes (Signed)
[name]   ADMISSION DATE:  04/25/2017  CONSULTATION DATE:  04/25/2017  REFERRING MD:  Thomasene Lot  CHIEF COMPLAINT:  Altered Mental Status  Presenting HPI:  76 y.o. male w/ h/o but not limited to CAD, stroke with residual left-sided weakness, right PCA infarct with possible left-sided visual field cuts, DM, dementia.  He presented [facility] on 7/25 with AMS and as a code stroke. Apparently he had left-sided deficits per AMS. One day prior, he had a hypoglycemic event and required treatment by EMS; however, he was not transferred to hospital. Upon arrival to ED, he had a focal seizure. Neurology evaluated him at bedside and obtain CT of the head. Immediately following CT, he had left facial twitching for which he received Ativan followed by Keppra as well as Dilantin. He developed  Complete heart block and associated shock. Subsequently intubated for airway protection. Prior to intubation, there was concern for aspiration event.  He was evaluated by cardiology for possible pacemaker needs.  Subjective:  Developed Stridor over night. Decadron 4 mg IV started . Self extubated 8/2, tolerating BiPAP well. Awake and following commands    Vent Mode: BIPAP FiO2 (%):  [40 %-60 %] 40 % Set Rate:  [8 bmp-15 bmp] 8 bmp Vt Set:  [500 mL] 500 mL PEEP:  [5 cmH20-6 cmH20] 5 cmH20 Pressure Support:  [6 cmH20-12 cmH20] 6 cmH20  Temp:  [97.6 F (36.4 C)-98.9 F (37.2 C)] 98.9 F (37.2 C) (08/03 0800) Pulse Rate:  [81-125] 97 (08/03 0900) Resp:  [11-29] 11 (08/03 0900) BP: (105-158)/(63-112) 145/94 (08/03 0900) SpO2:  [97 %-100 %] 100 % (08/03 0900) FiO2 (%):  [40 %-60 %] 40 % (08/03 0820) Weight:  [144 lb 2.9 oz (65.4 kg)] 144 lb 2.9 oz (65.4 kg) (08/03 0441)  General:  No family at bedside. No distress.Tolerating BiPAP.  Integument:  Warm & dry.Intact,  No rash or lesions noted  Extremities:  No cyanosis or clubbing.  HEENT:  Moist mucus membranes. Normocephalic, atraumatic Cardiovascular:  S1, S2  Regular rate. No RMG. No appreciable JVD.  Pulmonary:  resps even non labored on BiPAP, Clear throughout, diminished per bases  Abdomen: Soft. BS + Nondistended. Non-tender  Neurological: opens eyes, moves spontaneously, following commands    LINES/TUBES: OETT 7/25 >>> OGT 7/25 >>> PIV  CBC Latest Ref Rng & Units 05/04/2017 05/03/2017 05/02/2017  WBC 4.0 - 10.5 K/uL 15.8(H) 11.5(H) 9.3  Hemoglobin 13.0 - 17.0 g/dL 8.9(L) 7.5(L) 7.7(L)  Hematocrit 39.0 - 52.0 % 30.8(L) 26.0(L) 25.5(L)  Platelets 150 - 400 K/uL 507(H) 401(H) 320    BMP Latest Ref Rng & Units 05/04/2017 05/03/2017 05/03/2017  Glucose 65 - 99 mg/dL 169(H) 63(L) 137(H)  BUN 6 - 20 mg/dL '16 15 14  ' Creatinine 0.61 - 1.24 mg/dL 0.86 0.83 0.83  Sodium 135 - 145 mmol/L 137 136 135  Potassium 3.5 - 5.1 mmol/L 4.3 3.8 3.6  Chloride 101 - 111 mmol/L 97(L) 96(L) 97(L)  CO2 22 - 32 mmol/L 31 34(H) 33(H)  Calcium 8.9 - 10.3 mg/dL 9.0 9.0 8.4(L)    Hepatic Function Latest Ref Rng & Units 05/03/2017 05/02/2017 05/01/2017  Total Protein 6.5 - 8.1 g/dL - - -  Albumin 3.5 - 5.0 g/dL 1.7(L) 1.5(L) 1.5(L)  AST 15 - 41 U/L - - -  ALT 17 - 63 U/L - - -  Alk Phosphatase 38 - 126 U/L - - -  Total Bilirubin 0.3 - 1.2 mg/dL - - -  Bilirubin, Direct 0.0 - 0.3 mg/dL - - -  IMAGING/STUDIES: UDS 7/26:  Positive Benzos TTE 7/26:  LV normal in size with moderate LVH & focal basal septal hypertrophy. EF 35-40% with diffuse hypokinesis. LA mild to moderately dilated & RA normal in size. RV normal in size and function. No aortic stenosis or regurgitation. Aortic root normal in size. Mild to moderate mitral regurgitation without stenosis. No pulmonic stenosis. Mild tricuspid regurgitation. No pericardial effusion. MRI BRAIN W/O 7/26:  Moderate atrophy. 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 Extensive chronic ischemic change as above. No acute infarct. Numerous areas of chronic microhemorrhage the brain  likely related to hypertension. EEG 7/27:  The EEG is abnormal and findings are suggestive of Moderate Generalized cerebral dysfunction. Epileptiform activity was not seen during this recording. PORT CXR 7/30:  Previously reviewed by me. Enteric feeding tube coursing low diaphragm & endotracheal tube in good position. Persistent silhouetting of left hemidiaphragm with left lower lung opacity as well as patchy right mid to lower lung opacification. Improved aeration of right lower lung compared with previous imaging. CXR 8/3: Continued clearing of bibasilar atelectasis. Tiny left pleural effusion noted. Stable cardiomegaly .   MICROBIOLOGY: MRSA PCR 7/26:  Negative   ANTIBIOTICS: None.  SIGNIFICANT EVENTS: 07/25 - Admit & weaned of Levophed after developing complete heart block following 1 dose of Dilantin. 8/2 Self extubated  ASSESSMENT/PLAN:  76 y.o. man with acute encephalopathy secondary to seizures. Developed CHB due to dilantin, which has resolved. Encephalopathy slowly improving. Secretions seem to have improved some. Tolerating PS but mental status seems to be waxing/waning.   PULMONARY A: Acute hypoxic respiratory failure: Secondary encephalopathy Increased secretions Self extubated 8/2>> BiPAP over night > tolerating well Developed stridor over night >> Decadron initiated No stridor 8/3/am P:   BiPAP 2 hrs on and 2 hrs  off during the day Nocturnal BiPAP at night Continue Duonebs Continue Decadron x 24 additional hours Maintain saturations 88-92% Aggressive pulmonary toilet Mobilize  CXR 8/4 ABG prn     CARDIOVASCULAR A:  Complete heart block: likely secondary to Dilantin. Resolved Shock: in setting of CHB. resolved Atrial fibrillation: now sinus Chronic systolic CHF H/o CAD, HTN P:  Telemetry Scheduled diltiazem Continue Lasix BID>> negative 3L last 24 hours Maintain MAP > 65 MM Hg  RENAL A:   AKI: improved Hypomag>> resolved Hypophos>> resolved  P:    Trend BMET Replete electrolytes as needed Avoid nephrotoxic medications Maintain renal perfusion Strict  I&O   GASTROINTESTINAL A:   Protein calorie malnutrition.  Constipation  P:   TF on hold while on BiPAP Senna BID Add Dulcolax supp  prn  HEMATOLOGIC A:   Chronic anemia.   P:  SQ heparin Trend  CBC Monitor for any bleeding  INFECTIOUS A:   Leukocytosis>> Decadron/ reactive Afebrile Clinically asymptomatic  P:   Follow wbc, fever curve - monitor secretions  Culture if febrile or clinically changes  ENDOCRINE A:   DM  Multiple hypoglycemic events, now hyperglycemic with Decadron  TF off with BiPAP P:   Continue Q 4 CBG monitoring   SSI per moderate scale Consider starting diet if tolerates being off BiPAP   NEUROLOGIC A:   Acute encephalopathy: Secondary to seizures.  Seizures: none on EEG 7/27 Mental status improving>> No further seizures  P:   RASS goal: 0 to -1 Minimizing sedation Neuro signed off Continue Keppra No further Dilantin! Seizure precautions. Consider further imaging if mental status does not improve   FAMILY  -  Updates: No family present this AM. Daughter called and spoke with nursing am 8/3  - Inter-disciplinary family meet or Palliative Care meeting due by:  8/1  Magdalen Spatz, AGACNP-BC [facility] 05/04/2017  9:53 AM [phone]

## 2017-05-04 NOTE — Care Management Note (Signed)
Case Management Note  Patient Details  Name: [name]. MRN: [id number] Date of Birth: 09/02/1941  Subjective/Objective:  Pt admitted on 04/25/17 with seizure activity and VDRF.  PTA, pt resided at home with son and daughter in law.                   Action/Plan: Pt currently remains intubated; will follow for discharge planning as pt progresses.    Expected Discharge Date:                  Expected Discharge Plan:     In-House Referral:     Discharge planning Services  CM Consult  Post Acute Care Choice:    Choice offered to:     DME Arranged:    DME Agency:     HH Arranged:    HH Agency:     Status of Service:  In process, will continue to follow  If discussed at Long Length of Stay Meetings, dates discussed:    Additional Comments:  05/01/17 J. Elmus Mathes, RN, BSN Pt remains intubated; he is currently off sedation.  Bedside nurse reports that pt is weaning, following commands, and is more alert than yesterday.  Hopeful for extubation soon.  Will follow progress.    05/03/17 J. Kersti Scavone, RN, BSN  Pt self-extubated on 05/03/17 and is now on BIPAP.   He is tolerating BIPAP well.  Will continue to follow progress.    Vincent Raddle, RN, BSN  Trauma/Neuro ICU Case Manager [phone]

## 2017-05-05 ENCOUNTER — Inpatient Hospital Stay (HOSPITAL_COMMUNITY): Payer: Medicare Other

## 2017-05-05 LAB — GLUCOSE, CAPILLARY
GLUCOSE-CAPILLARY: 139 mg/dL — AB (ref 65–99)
Glucose-Capillary: 170 mg/dL — ABNORMAL HIGH (ref 65–99)
Glucose-Capillary: 185 mg/dL — ABNORMAL HIGH (ref 65–99)
Glucose-Capillary: 194 mg/dL — ABNORMAL HIGH (ref 65–99)
Glucose-Capillary: 315 mg/dL — ABNORMAL HIGH (ref 65–99)
Glucose-Capillary: 353 mg/dL — ABNORMAL HIGH (ref 65–99)

## 2017-05-05 LAB — BASIC METABOLIC PANEL
Anion gap: 10 (ref 5–15)
BUN: 27 mg/dL — AB (ref 6–20)
CALCIUM: 9 mg/dL (ref 8.9–10.3)
CHLORIDE: 98 mmol/L — AB (ref 101–111)
CO2: 29 mmol/L (ref 22–32)
CREATININE: 0.91 mg/dL (ref 0.61–1.24)
Glucose, Bld: 186 mg/dL — ABNORMAL HIGH (ref 65–99)
Potassium: 3.7 mmol/L (ref 3.5–5.1)
SODIUM: 137 mmol/L (ref 135–145)

## 2017-05-05 LAB — CBC
HCT: 28.1 % — ABNORMAL LOW (ref 39.0–52.0)
HEMOGLOBIN: 8.4 g/dL — AB (ref 13.0–17.0)
MCH: 22.6 pg — AB (ref 26.0–34.0)
MCHC: 29.9 g/dL — AB (ref 30.0–36.0)
MCV: 75.7 fL — ABNORMAL LOW (ref 78.0–100.0)
PLATELETS: 603 10*3/uL — AB (ref 150–400)
RBC: 3.71 MIL/uL — ABNORMAL LOW (ref 4.22–5.81)
RDW: 23.6 % — ABNORMAL HIGH (ref 11.5–15.5)
WBC: 22.6 10*3/uL — ABNORMAL HIGH (ref 4.0–10.5)

## 2017-05-05 MED ORDER — SERTRALINE HCL 20 MG/ML PO CONC
25.0000 mg | Freq: Every day | ORAL | Status: DC
  Administered 2017-05-05 – 2017-05-14 (×9): 25 mg
  Filled 2017-05-05 (×12): qty 1.25

## 2017-05-05 MED ORDER — DEXTROSE-NACL 5-0.45 % IV SOLN
INTRAVENOUS | Status: DC
  Administered 2017-05-05: 13:00:00 via INTRAVENOUS

## 2017-05-05 MED ORDER — FENTANYL CITRATE (PF) 100 MCG/2ML IJ SOLN
12.5000 ug | INTRAMUSCULAR | Status: DC | PRN
  Administered 2017-05-05 – 2017-05-08 (×2): 25 ug via INTRAVENOUS
  Filled 2017-05-05 (×2): qty 2

## 2017-05-05 MED ORDER — JEVITY 1.2 CAL PO LIQD
1000.0000 mL | ORAL | Status: DC
  Administered 2017-05-05 – 2017-05-11 (×6): 1000 mL
  Filled 2017-05-05 (×14): qty 1000

## 2017-05-05 MED ORDER — FENTANYL CITRATE (PF) 100 MCG/2ML IJ SOLN
INTRAMUSCULAR | Status: AC
  Filled 2017-05-05: qty 2

## 2017-05-05 MED ORDER — ARIPIPRAZOLE 2 MG PO TABS
2.0000 mg | ORAL_TABLET | Freq: Every day | ORAL | Status: DC
  Administered 2017-05-05 – 2017-05-15 (×11): 2 mg
  Filled 2017-05-05 (×11): qty 1

## 2017-05-05 MED ORDER — IPRATROPIUM-ALBUTEROL 0.5-2.5 (3) MG/3ML IN SOLN
3.0000 mL | Freq: Three times a day (TID) | RESPIRATORY_TRACT | Status: DC
  Administered 2017-05-05 – 2017-05-06 (×3): 3 mL via RESPIRATORY_TRACT
  Filled 2017-05-05 (×3): qty 3

## 2017-05-05 MED ORDER — INSULIN ASPART 100 UNIT/ML ~~LOC~~ SOLN
0.0000 [IU] | SUBCUTANEOUS | Status: DC
  Administered 2017-05-05: 7 [IU] via SUBCUTANEOUS
  Administered 2017-05-05: 2 [IU] via SUBCUTANEOUS
  Administered 2017-05-06: 9 [IU] via SUBCUTANEOUS
  Administered 2017-05-06: 5 [IU] via SUBCUTANEOUS
  Administered 2017-05-06: 10 [IU] via SUBCUTANEOUS
  Administered 2017-05-06 (×3): 7 [IU] via SUBCUTANEOUS
  Administered 2017-05-07 (×2): 5 [IU] via SUBCUTANEOUS
  Administered 2017-05-07: 3 [IU] via SUBCUTANEOUS
  Administered 2017-05-07: 5 [IU] via SUBCUTANEOUS

## 2017-05-05 MED ORDER — FUROSEMIDE 10 MG/ML IJ SOLN
40.0000 mg | Freq: Every day | INTRAMUSCULAR | Status: DC
  Administered 2017-05-06 – 2017-05-08 (×3): 40 mg via INTRAVENOUS
  Filled 2017-05-05 (×3): qty 4

## 2017-05-05 MED ORDER — ONDANSETRON HCL 4 MG/2ML IJ SOLN: 4.0000 mg | Freq: Three times a day (TID) | INTRAMUSCULAR | Status: DC | PRN

## 2017-05-05 MED ORDER — INSULIN ASPART 100 UNIT/ML ~~LOC~~ SOLN: 0.0000 [IU] | Freq: Three times a day (TID) | SUBCUTANEOUS | Status: DC

## 2017-05-05 MED ORDER — JEVITY 1.2 CAL PO LIQD
1000.0000 mL | ORAL | Status: DC
  Administered 2017-05-05: 15:00:00
  Filled 2017-05-05 (×2): qty 1000

## 2017-05-05 NOTE — Progress Notes (Signed)
Attempted to place patient on BiPAP for tonight but patient being non compliant at this time and angry. Patient wants Water but cant have anything until swallow evaluation. Patient stated he would wear BiPAP then at this time. Patient is very comfortable on 5l [place] and tolerating well.

## 2017-05-05 NOTE — Progress Notes (Signed)
Left wrist peripheral line removed.  Purulent drainage immediately present upon removal.  Arm red, swollen, tender to touch and guarded.

## 2017-05-05 NOTE — Progress Notes (Signed)
[place] Progress Note Patient Name: [name]. DOB: 1940/10/29 MRN: [id number]   Date of Service  05/05/2017  HPI/Events of Note  Request to restart home Abilify and Zoloft.   eICU Interventions  Will restart home Abilify and Zoloft.      Intervention Category Intermediate Interventions: Other:  Lysle Dingwall 05/05/2017, 4:37 PM

## 2017-05-05 NOTE — Progress Notes (Signed)
Cortrak Tube Team Note:  Consult received to place a Cortrak feeding tube.   A 10 F Cortrak tube was placed in the R nare and secured with a nasal bridle at 90 cm. Per the Cortrak monitor reading the tube tip is postpyloric, approximately d1/d2  X-ray is required, abdominal x-ray has been ordered by the Cortrak team. Please confirm tube placement before using the Cortrak tube.   If the tube becomes dislodged please keep the tube and contact the Cortrak team at [email or web address] (password [email or web address]) for replacement.  If after hours and replacement cannot be delayed, place a NG tube and confirm placement with an abdominal x-ray.   Burtis Junes RD, LDN, CNSC Clinical Nutrition Pager: [phone] 05/05/2017 2:22 PM

## 2017-05-05 NOTE — Plan of Care (Signed)
Problem: Bowel/Gastric: Goal: Will not experience complications related to bowel motility Outcome: Completed/Met Date Met: 05/05/17 Colace and Senna were started to address constipation. Pt is beginning to have regular bowel movements.

## 2017-05-05 NOTE — Progress Notes (Signed)
[facility] Progress Note Patient Name: [name]. DOB: 1941-04-02 MRN: [id number]   Date of Service  05/05/2017  HPI/Events of Note  Hyperglycemia - Patient is on tube feeds and AC sensitive Novolog SSI only.   eICU Interventions  Will order: 1. D/C AC sensitive Novolog SSI. 2. Q 4 hour sensitive Novolog SSI.      Intervention Category Major Interventions: Hyperglycemia - active titration of insulin therapy  Sommer,Steven Eugene 05/05/2017, 5:20 PM

## 2017-05-05 NOTE — Progress Notes (Signed)
Nutrition Follow-up  INTERVENTION:   Jevity 1.2 @ 60 ml/hr (1440 ml/day) 30 ml Prostat daily Provides: 1828 kcal, 95 grams protein, and 1166 ml free water.   Recommend 180 ml free water TID Total free water: 1706 ml   NUTRITION DIAGNOSIS:   Inadequate oral intake related to dysphagia as evidenced by NPO status. Ongoing.   GOAL:   Patient will meet greater than or equal to 90% of their needs Progressing.   MONITOR:   TF tolerance, Diet advancement, I & O's  REASON FOR ASSESSMENT:   Consult Enteral/tube feeding initiation and management  ASSESSMENT:    76 yo male admitted with VDRF due to inability to protect airway in setting of seizures and AMS, possible aspiration during intubation. Pt with hx of dementia, CHF, DM, old CVA, HTN, ulcerative esophagitis  8/3 pt extubated 8/4 failed swallow eval, Cortrak placed (tip proximal duodenum)  Medications reviewed and include: decadron, colace, lasix, SSI, senokot CBG's: 185-170   Diet Order:  Diet NPO time specified  Skin:  Reviewed, no issues  Last BM:  8/4 large   Height:   Ht Readings from Last 1 Encounters:  04/26/17 6' (1.829 m)    Weight:   Wt Readings from Last 1 Encounters:  05/05/17 145 lb 1 oz (65.8 kg)    Ideal Body Weight:  80.9 kg  BMI:  Body mass index is 19.67 kg/m.  Estimated Nutritional Needs:   Kcal:  1700-1900  Protein:  80-100 grams  Fluid:  >/= 1.7 L/day  EDUCATION NEEDS:   No education needs identified at this time  [place], [place], [place] Pager [phone] After Hours Pager

## 2017-05-05 NOTE — Progress Notes (Addendum)
[facility]   ADMISSION DATE:  04/25/2017  CONSULTATION DATE:  04/25/2017  REFERRING MD:  Thomasene Lot  CHIEF COMPLAINT:  Altered Mental Status brief:  76 y.o. male w/ h/o but not limited to CAD, stroke with residual left-sided weakness, right PCA infarct with possible left-sided visual field cuts, DM, dementia.  He presented [facility] on 7/25 with AMS and as a code stroke. Apparently he had left-sided deficits per AMS. One day prior, he had a hypoglycemic event and required treatment by EMS; however, he was not transferred to hospital. Upon arrival to ED, he had a focal seizure. Neurology evaluated him at bedside and obtain CT of the head. Immediately following CT, he had left facial twitching for which he received Ativan followed by Keppra as well as Dilantin. He developed  Complete heart block and associated shock. Subsequently intubated for airway protection. Prior to intubation, there was concern for aspiration event.  He was evaluated by cardiology for possible pacemaker needs.   LINES/TUBES: OETT 7/25 >>> OGT 7/25 >>> PIV     IMAGING/STUDIES: UDS 7/26:  Positive Benzos TTE 7/26:  LV normal in size with moderate LVH & focal basal septal hypertrophy. EF 35-40% with diffuse hypokinesis. LA mild to moderately dilated & RA normal in size. RV normal in size and function. No aortic stenosis or regurgitation. Aortic root normal in size. Mild to moderate mitral regurgitation without stenosis. No pulmonic stenosis. Mild tricuspid regurgitation. No pericardial effusion. MRI BRAIN W/O 7/26:  Moderate atrophy. 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 Extensive chronic ischemic change as above. No acute infarct. Numerous areas of chronic microhemorrhage the brain likely related to hypertension. EEG 7/27:  The EEG is abnormal and findings are suggestive of Moderate Generalized cerebral dysfunction. Epileptiform activity was not seen during this  recording. PORT CXR 7/30:  Previously reviewed by me. Enteric feeding tube coursing low diaphragm & endotracheal tube in good position. Persistent silhouetting of left hemidiaphragm with left lower lung opacity as well as patchy right mid to lower lung opacification. Improved aeration of right lower lung compared with previous imaging. CXR 8/3: Continued clearing of bibasilar atelectasis. Tiny left pleural effusion noted. Stable cardiomegaly .   MICROBIOLOGY: MRSA PCR 7/26:  Negative   ANTIBIOTICS: None.  SIGNIFICANT EVENTS: 07/25 - Admit & weaned of Levophed after developing complete heart block following 1 dose of Dilantin. 8/2 Self extubated 8/3 - Developed Stridor over night. Decadron 4 mg IV started . Self extubated 8/2, tolerating BiPAP well. Awake and following commands   SUBJECTIVE/OVERNIGHT/INTERVAL HX 8/4 - remains extubated. Now -500cc negative balance. Refused bipap. More alert. Intermittently communicative - near baseline. RN reports rise in wbc associates with some purulence wihere Left wrist PIV was removed. No fever. Failed swallow. No arrhtymias reported other than tachycardia when turning. No stridor    FiO2 (%):  [40 %] 40 %  Temp:  [97.5 F (36.4 C)-97.7 F (36.5 C)] 97.7 F (36.5 C) (08/04 0800) Pulse Rate:  [77-130] 87 (08/04 1100) Resp:  [13-21] 14 (08/04 1100) BP: (107-138)/(62-101) 108/69 (08/04 1100) SpO2:  [97 %-100 %] 99 % (08/04 1100) FiO2 (%):  [40 %] 40 % (08/03 2000) Weight:  [65.8 kg (145 lb 1 oz)] 65.8 kg (145 lb 1 oz) (08/04 0453)     General Appearance:    Looks much better  Head:    Normocephalic, without obvious abnormality, atraumatic  Eyes:    PERRL - yes, conjunctiva/corneas - clar  Ears:    Normal external ear canals, both ears  Nose:   NG tube - no  Throat:  ETT TUBE - no , OG tube - no  Neck:   Supple,  No enlargement/tenderness/nodules     Lungs:     Clear to auscultation bilaterally, barrell chest  Chest wall:    No  deformity  Heart:    S1 and S2 normal, no murmur, CVP - no.  Pressors - no  Abdomen:     Soft, no masses, no organomegaly  Genitalia:    Not done  Rectal:   not done  Extremities:   Extremities- contractures     Skin:   Intact in exposed areas .      Neurologic:   Sedation - none -> RASS - +1 . Moves all 4s - slow cotnracteres. CAM-ICU - dementia    PULMONARY  Recent Labs Lab 04/29/17 0400 04/30/17 0430  PHART 7.476* 7.504*  PCO2ART 41.5 45.6  PO2ART 75.0* 61.0*  HCO3 30.7* 36.0*  TCO2 32 37  O2SAT 96.0 93.0    CBC  Recent Labs Lab 05/03/17 0404 05/04/17 0242 05/05/17 0915  HGB 7.5* 8.9* 8.4*  HCT 26.0* 30.8* 28.1*  WBC 11.5* 15.8* 22.6*  PLT 401* 507* 603*    COAGULATION No results for input(s): INR in the last 168 hours.  CARDIAC  No results for input(s): TROPONINI in the last 168 hours. No results for input(s): PROBNP in the last 168 hours.   CHEMISTRY  Recent Labs Lab 04/29/17 0221 04/30/17 0224 05/01/17 0220 05/02/17 5056 05/03/17 0404 05/03/17 2245 05/04/17 0242 05/05/17 0915  NA 135 132* 129* 131* 135 136 137 137  K 5.3* 3.6 3.5 3.6 3.6 3.8 4.3 3.7  CL 102 94* 93* 96* 97* 96* 97* 98*  CO2 27 30 31 31  33* 34* 31 29  GLUCOSE 121* 164* 185* 140* 137* 63* 169* 186*  BUN 14 12 13 11 14 15 16  27*  CREATININE 0.92 0.89 0.86 0.76 0.83 0.83 0.86 0.91  CALCIUM 7.8* 7.9* 8.1* 8.1* 8.4* 9.0 9.0 9.0  MG 1.6* 1.7 1.5* 2.0 1.9 2.3  --   --   PHOS 1.8* 3.4 2.6 2.9 3.4  --   --   --    Estimated Creatinine Clearance: 64.3 mL/min (by C-G formula based on SCr of 0.91 mg/dL).   LIVER  Recent Labs Lab 05/01/17 0220 05/02/17 0635 05/03/17 0404  ALBUMIN 1.5* 1.5* 1.7*     INFECTIOUS No results for input(s): LATICACIDVEN, PROCALCITON in the last 168 hours.   ENDOCRINE CBG (last 3)   Recent Labs  05/04/17 2315 05/05/17 0337 05/05/17 0742  GLUCAP 196* 139* 185*         IMAGING x48h  - image(s) personally visualized  -   highlighted  in bold Dg Chest Port 1 View  Result Date: 05/05/2017 CLINICAL DATA:  Respiratory failure. EXAM: PORTABLE CHEST 1 VIEW COMPARISON:  05/04/2017 and 04/29/2016 FINDINGS: Improving aeration at the left lung base. There continues to be densities or consolidation along the medial left lung base. Upper lungs are clear. Prominent soft tissue structures near the right lung apex are most likely related to vascular structures. Heart size upper limits of normal but stable. Negative for a pneumothorax. Again noted are surgical clips in the left upper chest/ axillary region. IMPRESSION: Slightly improved aeration at the left lung base. Otherwise, no acute findings. Electronically Signed   By: Markus Daft M.D.   On: 05/05/2017 09:18  Dg Chest Portable 1 View  Result Date: 05/04/2017 CLINICAL DATA:  Respiratory failure. EXAM: PORTABLE CHEST 1 VIEW COMPARISON:  05/03/2017 . FINDINGS: Mediastinum hilar structures normal. Stable cardiomegaly. Continued interim clearing of basilar atelectasis. Tiny left pleural effusion again noted. No pneumothorax. Surgical clips left chest. IMPRESSION: 1. Continued clearing of bibasilar atelectasis. Tiny left pleural effusion noted. 2. Stable cardiomegaly . Electronically Signed   By: Marcello Moores  Register   On: 05/04/2017 06:45   Dg Chest Port 1 View  Result Date: 05/03/2017 CLINICAL DATA:  Shortness of breath . EXAM: PORTABLE CHEST 1 VIEW COMPARISON:  05/01/2017 . FINDINGS: On extubation removal of NG tube. Mediastinum and hilar structures normal. Stable cardiomegaly. Persistent basilar atelectasis with continued improved aeration. Tiny bilateral pleural effusions again noted. COPD. No pneumothorax. Sliding hiatal hernia. IMPRESSION: 1. Interim extubation removal of NG tube. 2. Persistent basilar atelectasis with interim improvement from prior exam. Tiny bilateral pleural effusions again noted. COPD. 3.  Stable cardiomegaly. Electronically Signed   By: Marcello Moores  Register   On: 05/03/2017 15:32     ASSESSMENT/PLAN:  76 y.o. man with acute encephalopathy secondary to seizures. Developed CHB due to dilantin, which has resolved. Encephalopathy slowly improving. Secretions seem to have improved some. Tolerating PS but mental status seems to be waxing/waning.   PULMONARY A: Acute hypoxic respiratory failure: Secondary encephalopathy Increased secretions Self extubated 8/2>> BiPAP over night > tolerating well Developed stridor over night >> Decadron initiated No stridor 8/3/am  05/05/2017 - improed. Refused bipap. No stridor. downt to 1L [place]  P:   Dc bipap Wean o2 off Dc dexamthasone for stridro duoneb     CARDIOVASCULAR A:  Complete heart block: likely secondary to Dilantin. Resolved Shock: in setting of CHB. resolved Atrial fibrillation: now sinus Chronic systolic CHF H/o CAD, HTN    - 05/05/2017 - NSR 97 . Diuresed well. No arrhtymia  P Move to tele Cut down lasix cotninue cardizem     RENAL A:   AKI: improved Hypomag>> resolved Hypophos>> resolved  P:   Trend BMET Replete electrolytes as needed Avoid nephrotoxic medications Maintain renal perfusion Strict  I&O   GASTROINTESTINAL A:   Protein calorie malnutrition.  Constipation  05/05/2017- failed swallow  P:   cortrak and TF Senna BID Add Dulcolax supp  prn  HEMATOLOGIC A:   Chronic anemia.   P:  SQ heparin Trend  CBC Monitor for any bleeding  INFECTIOUS A:   Afebrile. Clinically asymptomatic but WBC high and RN noticed purulence at piv removal. Rise in WBC could be decadron related   P:   Monitor off antibiotics   Anti-infectives    None       ENDOCRINE A:   DM  Multiple hypoglycemic events, now hyperglycemic with Decadron   P:    SSI senstive saclke Consider starting diet if tolerates being off BiPAP   NEUROLOGIC A:   Acute encephalopathy: Secondary to seizures.  Seizures: none on EEG 7/27   8/ 4- Mental status improving>> No further seizures. Dementia +    P:   RASS goal: 0 to -1 Minimizing sedation Neuro signed off Continue Keppra No further Dilantin! (cHB) Seizure precautions.    FAMILY  - Updates: son at bedside updated 05/05/2017   - Inter-disciplinary family meet or Palliative Care meeting due by:  8/1    DISPOI mvoe to tele 05/05/2017 and Asked [facility] to take over 05/06/17 but they said is not their patient. Have paged Dr Thereasa Solo of [facility] and he accted with  ccm off   Dr. Brand Males, M.D., [facility].C.P Pulmonary and Critical Care Medicine Staff Physician [place] [facility] Pager: [phone], If no answer or between  15:00h - 7:00h: call [phone]  05/05/2017 12:04 PM
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
## 2017-05-06 DIAGNOSIS — Z4659 Encounter for fitting and adjustment of other gastrointestinal appliance and device: Secondary | ICD-10-CM

## 2017-05-06 LAB — CBC WITH DIFFERENTIAL/PLATELET
BASOS PCT: 0 %
Basophils Absolute: 0 10*3/uL (ref 0.0–0.1)
EOS ABS: 0 10*3/uL (ref 0.0–0.7)
Eosinophils Relative: 0 %
HEMATOCRIT: 26 % — AB (ref 39.0–52.0)
Hemoglobin: 7.7 g/dL — ABNORMAL LOW (ref 13.0–17.0)
Lymphocytes Relative: 5 %
Lymphs Abs: 0.8 10*3/uL (ref 0.7–4.0)
MCH: 22.2 pg — AB (ref 26.0–34.0)
MCHC: 29.6 g/dL — AB (ref 30.0–36.0)
MCV: 74.9 fL — AB (ref 78.0–100.0)
MONO ABS: 0.8 10*3/uL (ref 0.1–1.0)
Monocytes Relative: 5 %
NEUTROS PCT: 90 %
Neutro Abs: 14.6 10*3/uL — ABNORMAL HIGH (ref 1.7–7.7)
Platelets: 588 10*3/uL — ABNORMAL HIGH (ref 150–400)
RBC: 3.47 MIL/uL — ABNORMAL LOW (ref 4.22–5.81)
RDW: 23.1 % — AB (ref 11.5–15.5)
WBC: 16.2 10*3/uL — AB (ref 4.0–10.5)

## 2017-05-06 LAB — BASIC METABOLIC PANEL
ANION GAP: 5 (ref 5–15)
BUN: 28 mg/dL — ABNORMAL HIGH (ref 6–20)
CO2: 32 mmol/L (ref 22–32)
CREATININE: 0.89 mg/dL (ref 0.61–1.24)
Calcium: 8.6 mg/dL — ABNORMAL LOW (ref 8.9–10.3)
Chloride: 100 mmol/L — ABNORMAL LOW (ref 101–111)
GFR calc Af Amer: >60 mL/min (ref >60)
GFR calc non Af Amer: >60 mL/min (ref >60)
GLUCOSE: 371 mg/dL — AB (ref 65–99)
POTASSIUM: 3.7 mmol/L (ref 3.5–5.1)
Sodium: 137 mmol/L (ref 135–145)

## 2017-05-06 LAB — GLUCOSE, CAPILLARY
Glucose-Capillary: 307 mg/dL — ABNORMAL HIGH (ref 65–99)
Glucose-Capillary: 349 mg/dL — ABNORMAL HIGH (ref 65–99)
Glucose-Capillary: 427 mg/dL — ABNORMAL HIGH (ref 65–99)
Glucose-Capillary: 346 mg/dL — ABNORMAL HIGH (ref 65–99)
Glucose-Capillary: 313 mg/dL — ABNORMAL HIGH (ref 65–99)

## 2017-05-06 LAB — PHOSPHORUS: Phosphorus: 2.1 mg/dL — ABNORMAL LOW (ref 2.5–4.6)

## 2017-05-06 LAB — MAGNESIUM: MAGNESIUM: 2.1 mg/dL (ref 1.7–2.4)

## 2017-05-06 MED ORDER — SODIUM BICARBONATE 650 MG PO TABS
650.0000 mg | ORAL_TABLET | Freq: Once | ORAL | Status: AC
  Administered 2017-05-06: 650 mg via ORAL
  Filled 2017-05-06: qty 1

## 2017-05-06 MED ORDER — PANCRELIPASE (LIP-PROT-AMYL) 12000-38000 UNITS PO CPEP
12000.0000 [IU] | ORAL_CAPSULE | Freq: Three times a day (TID) | ORAL | Status: DC
  Administered 2017-05-06 – 2017-05-15 (×23): 12000 [IU] via ORAL
  Filled 2017-05-06 (×23): qty 1

## 2017-05-06 MED ORDER — INSULIN GLARGINE 100 UNIT/ML ~~LOC~~ SOLN
10.0000 [IU] | Freq: Every day | SUBCUTANEOUS | Status: DC
  Administered 2017-05-06 – 2017-05-07 (×2): 10 [IU] via SUBCUTANEOUS
  Filled 2017-05-06 (×2): qty 0.1

## 2017-05-06 NOTE — Progress Notes (Signed)
PROGRESS NOTE    Vincent Anes.  XNT:[id number] DOB: 05/25/1941 DOA: 04/25/2017 PCP: [name], No Pcp Per    Brief Narrative:  76 y.o. male w/ h/o but not limited to CAD, stroke with residual left-sided weakness, right PCA infarct with possible left-sided visual field cuts, DM, dementia. He presented [facility] on 7/25 with AMS and as a code stroke. Apparently he had left-sided deficits per AMS. One day prior, he had a hypoglycemic event and required treatment by EMS; however, he was not transferred to hospital. Upon arrival to ED, he had a focal seizure. Neurology evaluated him at bedside and obtain CT of the head. Immediately following CT, he had left facial twitching for which he received Ativan followed by Keppra as well as Dilantin. He developed Complete heart block and associated shock. Subsequently intubated for airway protection. Prior to intubation, there was concern for aspiration event. He was evaluated by cardiology for possible pacemaker needs.  Assessment & Plan:   Active Problems:   Seizures ([place])   Acute respiratory failure with hypoxia (H[facility])   Acute encephalopathy   Atelectasis   Seizure ([place])  1. Toxicmetabolic encephalopathy 1. Stable at present 2. Continue to monitor 2. Seizures 1. Previously followed by Neurology 2. Continued on Keppra 3. Dilantin stopped secondary to concerns of complete heart block 3. Acute respiratory failure with hypoxia 1. Required intubation this admission 2. [name] now on min O2 support, resolved 4. Recent complete heart block 1. Stable thus far 2. Cont Tele 5. afib 1. Continue monitor on tele 2. stable 6. Chronic systolic CHF 1. Appears to be euvolemic 2. Stable at present 7. Protein calorie malnutrition 1. Stable at present 2. Continue tube feeds as tolerated 8. Leukocytosis 1. Unclear etiology 2. Not on anti-infectives 3. CBC reviewed, WBC improved  DVT prophylaxis: Heparin subQ Code Status: Full Family Communication: Pt  in room, family not at bedside Disposition Plan: Uncertain at this time  Consultants:     Procedures:     Antimicrobials: Anti-infectives    None       Subjective: Stating wanting to go home  Objective: Vitals:   05/06/17 0609 05/06/17 0937 05/06/17 1408 05/06/17 1732  BP: 139/75 124/75 133/72 (!) 154/78  Pulse: (!) 108 (!) 118 79 79  Resp: 20 20 20 20   Temp: 98.1 F (36.7 C) 97.9 F (36.6 C) 98 F (36.7 C) 97.8 F (36.6 C)  TempSrc: Oral Oral Oral Oral  SpO2: 91% 98% 94% 100%  Weight:      Height:        Intake/Output Summary (Last 24 hours) at 05/06/17 1843 Last data filed at 05/06/17 1455  Gross per 24 hour  Intake              525 ml  Output              800 ml  Net             -275 ml   Filed Weights   05/03/17 0442 05/04/17 0441 05/05/17 0453  Weight: 68 kg (149 lb 14.6 oz) 65.4 kg (144 lb 2.9 oz) 65.8 kg (145 lb 1 oz)    Examination:  General exam: Appears calm and comfortable  Respiratory system: Clear to auscultation. Respiratory effort normal. Cardiovascular system: S1 & S2 heard, RRR. Gastrointestinal system: Abdomen is nondistended, soft and nontender. No organomegaly or masses felt. Normal bowel sounds heard. Central nervous system: Alert and oriented. No focal neurological deficits. Extremities: Symmetric 5 x 5 power. Skin:  No rashes, lesions  Psychiatry: JMood & affect appropriate. No visual hallucinations  Data Reviewed: I have personally reviewed following labs and imaging studies  CBC:  Recent Labs Lab 05/01/17 0220 05/02/17 0635 05/03/17 0404 05/04/17 0242 05/05/17 0915 05/06/17 0011  WBC 11.0* 9.3 11.5* 15.8* 22.6* 16.2*  NEUTROABS 7.4 6.2 8.4*  --   --  14.6*  HGB 7.6* 7.7* 7.5* 8.9* 8.4* 7.7*  HCT 24.5* 25.5* 26.0* 30.8* 28.1* 26.0*  MCV 72.3* 73.3* 74.9* 75.7* 75.7* 74.9*  PLT 289 320 401* 507* 603* 503*   Basic Metabolic Panel:  Recent Labs Lab 04/30/17 0224 05/01/17 0220 05/02/17 0635 05/03/17 0404  05/03/17 2245 05/04/17 0242 05/05/17 0915 05/06/17 0011  NA 132* 129* 131* 135 136 137 137 137  K 3.6 3.5 3.6 3.6 3.8 4.3 3.7 3.7  CL 94* 93* 96* 97* 96* 97* 98* 100*  CO2 30 31 31  33* 34* 31 29 32  GLUCOSE 164* 185* 140* 137* 63* 169* 186* 371*  BUN 12 13 11 14 15 16  27* 28*  CREATININE 0.89 0.86 0.76 0.83 0.83 0.86 0.91 0.89  CALCIUM 7.9* 8.1* 8.1* 8.4* 9.0 9.0 9.0 8.6*  MG 1.7 1.5* 2.0 1.9 2.3  --   --  2.1  PHOS 3.4 2.6 2.9 3.4  --   --   --  2.1*   GFR: Estimated Creatinine Clearance: 65.7 mL/min (by C-G formula based on SCr of 0.89 mg/dL). Liver Function Tests:  Recent Labs Lab 05/01/17 0220 05/02/17 0635 05/03/17 0404  ALBUMIN 1.5* 1.5* 1.7*   No results for input(s): LIPASE, AMYLASE in the last 168 hours. No results for input(s): AMMONIA in the last 168 hours. Coagulation Profile: No results for input(s): INR, PROTIME in the last 168 hours. Cardiac Enzymes: No results for input(s): CKTOTAL, CKMB, CKMBINDEX, TROPONINI in the last 168 hours. BNP (last 3 results) No results for input(s): PROBNP in the last 8760 hours. HbA1C: No results for input(s): HGBA1C in the last 72 hours. CBG:  Recent Labs Lab 05/05/17 2357 05/06/17 0418 05/06/17 0800 05/06/17 1108 05/06/17 1603  GLUCAP 353* 307* 349* 427* 346*   Lipid Profile: No results for input(s): CHOL, HDL, LDLCALC, TRIG, CHOLHDL, LDLDIRECT in the last 72 hours. Thyroid Function Tests: No results for input(s): TSH, T4TOTAL, FREET4, T3FREE, THYROIDAB in the last 72 hours. Anemia Panel: No results for input(s): VITAMINB12, FOLATE, FERRITIN, TIBC, IRON, RETICCTPCT in the last 72 hours. Sepsis Labs: No results for input(s): PROCALCITON, LATICACIDVEN in the last 168 hours.  No results found for this or any previous visit (from the past 240 hour(s)).   Radiology Studies: Dg Chest Port 1 View  Result Date: 05/05/2017 CLINICAL DATA:  Respiratory failure. EXAM: PORTABLE CHEST 1 VIEW COMPARISON:  05/04/2017 and  04/29/2016 FINDINGS: Improving aeration at the left lung base. There continues to be densities or consolidation along the medial left lung base. Upper lungs are clear. Prominent soft tissue structures near the right lung apex are most likely related to vascular structures. Heart size upper limits of normal but stable. Negative for a pneumothorax. Again noted are surgical clips in the left upper chest/ axillary region. IMPRESSION: Slightly improved aeration at the left lung base. Otherwise, no acute findings. Electronically Signed   By: Markus Daft M.D.   On: 05/05/2017 09:18   Dg Abd Portable 1v  Result Date: 05/05/2017 CLINICAL DATA:  Encounter for NG tube placement EXAM: PORTABLE ABDOMEN - 1 VIEW COMPARISON:  04/25/2017 FINDINGS: Placement of feeding tube with weighted tip  in the proximal duodenum. Coin like foreign body projects over the RIGHT lower quadrant. Prominence of small bowel. Stool in rectum. IMPRESSION: 1. Enteric tube with tip in proximal duodenum. 2. Coin like foreign body projects over the RIGHT lower quadrant. Electronically Signed   By: Suzy Bouchard M.D.   On: 05/05/2017 15:00    Scheduled Meds: . ARIPiprazole  2 mg Per Tube Daily  . chlorhexidine  15 mL Mouth Rinse BID  . dexamethasone  4 mg Intravenous Q6H  . diltiazem  60 mg Per Tube Q6H  . docusate  100 mg Per Tube BID  . feeding supplement (PRO-STAT SUGAR FREE 64)  30 mL Per Tube Daily  . furosemide  40 mg Intravenous Daily  . heparin  5,000 Units Subcutaneous Q8H  . insulin aspart  0-9 Units Subcutaneous Q4H  . insulin glargine  10 Units Subcutaneous Daily  . ipratropium-albuterol  3 mL Nebulization TID  . mouth rinse  15 mL Mouth Rinse q12n4p  . metoprolol tartrate  2.5 mg Intravenous Q6H  . pantoprazole (PROTONIX) IV  40 mg Intravenous Q24H  . sennosides  5 mL Per Tube BID  . sertraline  25 mg Per Tube QHS   Continuous Infusions: . sodium chloride    . dextrose 5 % and 0.45% NaCl 10 mL/hr at 05/05/17 1700  .  feeding supplement (JEVITY 1.2 CAL) 1,000 mL (05/06/17 1041)  . levETIRAcetam Stopped (05/06/17 0957)     LOS: 11 days   Courtenay Creger, Orpah Melter, MD [facility] Pager [phone]  If 7PM-7AM, please contact night-coverage [email or web address] Password [facility] 05/06/2017, 6:43 PM
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
## 2017-05-07 ENCOUNTER — Inpatient Hospital Stay (HOSPITAL_COMMUNITY): Payer: Medicare Other

## 2017-05-07 DIAGNOSIS — Z789 Other specified health status: Secondary | ICD-10-CM

## 2017-05-07 LAB — CBC WITH DIFFERENTIAL/PLATELET
BASOS PCT: 0 %
Basophils Absolute: 0 10*3/uL (ref 0.0–0.1)
EOS ABS: 0 10*3/uL (ref 0.0–0.7)
EOS PCT: 0 %
HCT: 26.6 % — ABNORMAL LOW (ref 39.0–52.0)
HEMOGLOBIN: 7.7 g/dL — AB (ref 13.0–17.0)
Lymphocytes Relative: 8 %
Lymphs Abs: 1 10*3/uL (ref 0.7–4.0)
MCH: 21.9 pg — AB (ref 26.0–34.0)
MCHC: 28.9 g/dL — AB (ref 30.0–36.0)
MCV: 75.6 fL — AB (ref 78.0–100.0)
MONO ABS: 0.5 10*3/uL (ref 0.1–1.0)
Monocytes Relative: 4 %
NEUTROS ABS: 11 10*3/uL — AB (ref 1.7–7.7)
Neutrophils Relative %: 88 %
PLATELETS: 608 10*3/uL — AB (ref 150–400)
RBC: 3.52 MIL/uL — ABNORMAL LOW (ref 4.22–5.81)
RDW: 23.2 % — ABNORMAL HIGH (ref 11.5–15.5)
WBC: 12.5 10*3/uL — ABNORMAL HIGH (ref 4.0–10.5)

## 2017-05-07 LAB — GLUCOSE, CAPILLARY
GLUCOSE-CAPILLARY: 237 mg/dL — AB (ref 65–99)
GLUCOSE-CAPILLARY: 260 mg/dL — AB (ref 65–99)
GLUCOSE-CAPILLARY: 269 mg/dL — AB (ref 65–99)
GLUCOSE-CAPILLARY: 516 mg/dL — AB (ref 65–99)
GLUCOSE-CAPILLARY: 516 mg/dL — AB (ref 65–99)
Glucose-Capillary: 266 mg/dL — ABNORMAL HIGH (ref 65–99)

## 2017-05-07 LAB — BASIC METABOLIC PANEL
Anion gap: 7 (ref 5–15)
BUN: 25 mg/dL — AB (ref 6–20)
CHLORIDE: 102 mmol/L (ref 101–111)
CO2: 32 mmol/L (ref 22–32)
CREATININE: 0.85 mg/dL (ref 0.61–1.24)
Calcium: 8.8 mg/dL — ABNORMAL LOW (ref 8.9–10.3)
GFR calc Af Amer: >60 mL/min (ref >60)
GFR calc non Af Amer: >60 mL/min (ref >60)
GLUCOSE: 269 mg/dL — AB (ref 65–99)
POTASSIUM: 3.5 mmol/L (ref 3.5–5.1)
SODIUM: 141 mmol/L (ref 135–145)

## 2017-05-07 LAB — MAGNESIUM: MAGNESIUM: 2 mg/dL (ref 1.7–2.4)

## 2017-05-07 LAB — PHOSPHORUS: Phosphorus: 2.5 mg/dL (ref 2.5–4.6)

## 2017-05-07 MED ORDER — INSULIN GLARGINE 100 UNIT/ML ~~LOC~~ SOLN
10.0000 [IU] | Freq: Two times a day (BID) | SUBCUTANEOUS | Status: DC
  Administered 2017-05-07: 10 [IU] via SUBCUTANEOUS
  Filled 2017-05-07 (×3): qty 0.1

## 2017-05-07 MED ORDER — INSULIN ASPART 100 UNIT/ML ~~LOC~~ SOLN
0.0000 [IU] | SUBCUTANEOUS | Status: DC
  Administered 2017-05-07 – 2017-05-08 (×3): 20 [IU] via SUBCUTANEOUS

## 2017-05-07 MED ORDER — LABETALOL HCL 5 MG/ML IV SOLN
5.0000 mg | INTRAVENOUS | Status: DC | PRN
  Administered 2017-05-07 (×2): 5 mg via INTRAVENOUS
  Filled 2017-05-07 (×2): qty 4

## 2017-05-07 MED ORDER — INSULIN GLARGINE 100 UNIT/ML ~~LOC~~ SOLN: 10.0000 [IU] | Freq: Every day | SUBCUTANEOUS | Status: DC

## 2017-05-07 MED ORDER — DILTIAZEM 12 MG/ML ORAL SUSPENSION
90.0000 mg | Freq: Four times a day (QID) | ORAL | Status: DC
  Administered 2017-05-08 – 2017-05-15 (×31): 90 mg
  Filled 2017-05-07 (×34): qty 9

## 2017-05-07 MED ORDER — METOPROLOL TARTRATE 5 MG/5ML IV SOLN
5.0000 mg | Freq: Four times a day (QID) | INTRAVENOUS | Status: DC
  Administered 2017-05-07 – 2017-05-15 (×33): 5 mg via INTRAVENOUS
  Filled 2017-05-07 (×34): qty 5

## 2017-05-07 MED ORDER — IPRATROPIUM-ALBUTEROL 0.5-2.5 (3) MG/3ML IN SOLN: 3.0000 mL | RESPIRATORY_TRACT | Status: DC | PRN

## 2017-05-07 MED ORDER — DILTIAZEM 12 MG/ML ORAL SUSPENSION
30.0000 mg | ORAL | Status: AC
  Administered 2017-05-07: 30 mg
  Filled 2017-05-07: qty 3

## 2017-05-07 NOTE — Care Management Note (Signed)
Case Management Note  Patient Details  Name: Isidoro Santillana. MRN: [id number] Date of Birth: August 18, 1941  Subjective/Objective:     Pt admitted with Seizures. He is from home with family.               Action/Plan: Plan is for SNF when medically ready. Pt has failed swallowing eval again. Pt will need PEG or be able to have diet prior to d/c to SNF. CM following.   Expected Discharge Date:                  Expected Discharge Plan:  Skilled Nursing Facility  In-House Referral:  Clinical Social Work  Discharge planning Services  CM Consult  Post Acute Care Choice:    Choice offered to:     DME Arranged:    DME Agency:     HH Arranged:    [place] Agency:     Status of Service:  In process, will continue to follow  If discussed at Long Length of Stay Meetings, dates discussed:    Additional Comments:  Pollie Friar, RN 05/07/2017, 11:55 AM

## 2017-05-07 NOTE — Progress Notes (Signed)
PROGRESS NOTE    [name].  KXF:[id number] DOB: 04/07/1941 DOA: 04/25/2017 PCP: Patient, No Pcp Per    Brief Narrative:  76 y.o. male w/ h/o but not limited to CAD, stroke with residual left-sided weakness, right PCA infarct with possible left-sided visual field cuts, DM, dementia. He presented [facility] on 7/25 with AMS and as a code stroke. Apparently he had left-sided deficits per AMS. One day prior, he had a hypoglycemic event and required treatment by EMS; however, he was not transferred to hospital. Upon arrival to ED, he had a focal seizure. Neurology evaluated him at bedside and obtain CT of the head. Immediately following CT, he had left facial twitching for which he received Ativan followed by Keppra as well as Dilantin. He developed Complete heart block and associated shock. Subsequently intubated for airway protection. Prior to intubation, there was concern for aspiration event. He was evaluated by cardiology for possible pacemaker needs.  Assessment & Plan:   Active Problems:   Seizures ([place])   Acute respiratory failure with hypoxia ([facility])   Acute encephalopathy   Atelectasis   Seizure ([place])  1. Toxicmetabolic encephalopathy 1. Remains stable at this time 2. Continue to monitor 2. Seizures 1. Previously followed by Neurology 2. Continued on Keppra 3. Dilantin stopped secondary to concerns of complete heart block 4. Remains stable 3. Acute respiratory failure with hypoxia 1. Required intubation this admission 2. Patient now on min O2 support 3. Remains stable 4. Recent complete heart block 1. Stable thus far 2. Continue to monitor on tele 5. Afib 1. Continue monitor on tele 2. Currently stable 6. Chronic systolic CHF 1. Appears to be euvolemic 2. Remains stable at present 7. Protein calorie malnutrition 1. Stable at present 2. Patient to continue tube feeing 3. SLP continues to follow. If unable to tolerate PO, ultimately may consider placement of PEG after  one week of NG feeding 8. Leukocytosis 1. Unclear etiology 2. Not on anti-infectives 3. Labs reviewed. WBC trending down  DVT prophylaxis: Heparin subQ Code Status: Full Family Communication: Pt in room, family not at bedside Disposition Plan: Uncertain at this time  Consultants:     Procedures:     Antimicrobials: Anti-infectives    None      Subjective: Asking to drink water  Objective: Vitals:   05/07/17 0400 05/07/17 0512 05/07/17 0838 05/07/17 1044  BP:  (!) 160/80  116/83  Pulse: 78 (!) 49  (!) 147  Resp:  18  18  Temp:  98.1 F (36.7 C)  97.8 F (36.6 C)  TempSrc:  Oral  Axillary  SpO2:  100% 100% 99%  Weight:  64.5 kg (142 lb 1.6 oz)    Height:        Intake/Output Summary (Last 24 hours) at 05/07/17 1410 Last data filed at 05/07/17 0300  Gross per 24 hour  Intake              240 ml  Output              800 ml  Net             -560 ml   Filed Weights   05/04/17 0441 05/05/17 0453 05/07/17 0512  Weight: 65.4 kg (144 lb 2.9 oz) 65.8 kg (145 lb 1 oz) 64.5 kg (142 lb 1.6 oz)    Examination: General exam: Awake, laying in bed, in nad, NG in place Respiratory system: Normal respiratory effort, no wheezing Cardiovascular system: regular rate, s1, s2 Gastrointestinal  system: Soft, nondistended, positive BS Central nervous system: CN2-12 grossly intact, strength intact Extremities: Perfused, no clubbing Skin: Normal skin turgor, no notable skin lesions seen Psychiatry: Mood normal / affect appears normal  Data Reviewed: I have personally reviewed following labs and imaging studies  CBC:  Recent Labs Lab 05/01/17 0220 05/02/17 3825 05/03/17 0404 05/04/17 0242 05/05/17 0915 05/06/17 0011 05/07/17 0319  WBC 11.0* 9.3 11.5* 15.8* 22.6* 16.2* 12.5*  NEUTROABS 7.4 6.2 8.4*  --   --  14.6* 11.0*  HGB 7.6* 7.7* 7.5* 8.9* 8.4* 7.7* 7.7*  HCT 24.5* 25.5* 26.0* 30.8* 28.1* 26.0* 26.6*  MCV 72.3* 73.3* 74.9* 75.7* 75.7* 74.9* 75.6*  PLT 289 320  401* 507* 603* 588* 053*   Basic Metabolic Panel:  Recent Labs Lab 05/01/17 0220 05/02/17 0635 05/03/17 0404 05/03/17 2245 05/04/17 0242 05/05/17 0915 05/06/17 0011 05/07/17 0319  NA 129* 131* 135 136 137 137 137 141  K 3.5 3.6 3.6 3.8 4.3 3.7 3.7 3.5  CL 93* 96* 97* 96* 97* 98* 100* 102  CO2 31 31 33* 34* 31 29 32 32  GLUCOSE 185* 140* 137* 63* 169* 186* 371* 269*  BUN 13 11 14 15 16  27* 28* 25*  CREATININE 0.86 0.76 0.83 0.83 0.86 0.91 0.89 0.85  CALCIUM 8.1* 8.1* 8.4* 9.0 9.0 9.0 8.6* 8.8*  MG 1.5* 2.0 1.9 2.3  --   --  2.1 2.0  PHOS 2.6 2.9 3.4  --   --   --  2.1* 2.5   GFR: Estimated Creatinine Clearance: 67.5 mL/min (by C-G formula based on SCr of 0.85 mg/dL). Liver Function Tests:  Recent Labs Lab 05/01/17 0220 05/02/17 0635 05/03/17 0404  ALBUMIN 1.5* 1.5* 1.7*   No results for input(s): LIPASE, AMYLASE in the last 168 hours. No results for input(s): AMMONIA in the last 168 hours. Coagulation Profile: No results for input(s): INR, PROTIME in the last 168 hours. Cardiac Enzymes: No results for input(s): CKTOTAL, CKMB, CKMBINDEX, TROPONINI in the last 168 hours. BNP (last 3 results) No results for input(s): PROBNP in the last 8760 hours. HbA1C: No results for input(s): HGBA1C in the last 72 hours. CBG:  Recent Labs Lab 05/06/17 2040 05/07/17 0008 05/07/17 0353 05/07/17 0756 05/07/17 1144  GLUCAP 313* 237* 260* 269* 266*   Lipid Profile: No results for input(s): CHOL, HDL, LDLCALC, TRIG, CHOLHDL, LDLDIRECT in the last 72 hours. Thyroid Function Tests: No results for input(s): TSH, T4TOTAL, FREET4, T3FREE, THYROIDAB in the last 72 hours. Anemia Panel: No results for input(s): VITAMINB12, FOLATE, FERRITIN, TIBC, IRON, RETICCTPCT in the last 72 hours. Sepsis Labs: No results for input(s): PROCALCITON, LATICACIDVEN in the last 168 hours.  No results found for this or any previous visit (from the past 240 hour(s)).   Radiology Studies: Dg Abd  Portable 1v  Result Date: 05/07/2017 CLINICAL DATA:  Feeding tube placement. EXAM: PORTABLE ABDOMEN - 1 VIEW COMPARISON:  05/05/2017 FINDINGS: Feeding tube tip is in the fourth portion of the duodenum in good position. Normal bowel gas pattern.  Numerous small gallstones. IMPRESSION: Feeding tube tip in the fourth portion of the duodenum. Cholelithiasis. Electronically Signed   By: Lorriane Shire M.D.   On: 05/07/2017 11:11   Dg Abd Portable 1v  Result Date: 05/05/2017 CLINICAL DATA:  Encounter for NG tube placement EXAM: PORTABLE ABDOMEN - 1 VIEW COMPARISON:  04/25/2017 FINDINGS: Placement of feeding tube with weighted tip in the proximal duodenum. Coin like foreign body projects over the RIGHT lower quadrant. Prominence  of small bowel. Stool in rectum. IMPRESSION: 1. Enteric tube with tip in proximal duodenum. 2. Coin like foreign body projects over the RIGHT lower quadrant. Electronically Signed   By: Suzy Bouchard M.D.   On: 05/05/2017 15:00    Scheduled Meds: . ARIPiprazole  2 mg Per Tube Daily  . chlorhexidine  15 mL Mouth Rinse BID  . dexamethasone  4 mg Intravenous Q6H  . diltiazem  60 mg Per Tube Q6H  . docusate  100 mg Per Tube BID  . feeding supplement (PRO-STAT SUGAR FREE 64)  30 mL Per Tube Daily  . furosemide  40 mg Intravenous Daily  . heparin  5,000 Units Subcutaneous Q8H  . insulin aspart  0-9 Units Subcutaneous Q4H  . insulin glargine  10 Units Subcutaneous Daily  . lipase/protease/amylase  12,000 Units Oral TID WC  . mouth rinse  15 mL Mouth Rinse q12n4p  . metoprolol tartrate  2.5 mg Intravenous Q6H  . pantoprazole (PROTONIX) IV  40 mg Intravenous Q24H  . sennosides  5 mL Per Tube BID  . sertraline  25 mg Per Tube QHS   Continuous Infusions: . sodium chloride    . dextrose 5 % and 0.45% NaCl 10 mL/hr at 05/05/17 1700  . feeding supplement (JEVITY 1.2 CAL) 1,000 mL (05/07/17 1203)  . levETIRAcetam Stopped (05/07/17 0811)     LOS: 12 days   CHIU, Orpah Melter,  MD [facility] Pager [phone]  If 7PM-7AM, please contact night-coverage [email or web address] Password [email or web address] 05/07/2017, 2:10 PM

## 2017-05-07 NOTE — Progress Notes (Signed)
Cortrak Tube Team Note:  Contacted by RN regarding a clogged Cortrak tube  10 F Cortrak tube was noticed to be coiled during tracing, tube pulled back and coil released. Tube repositioned and flushed well without difficulty. Cortrak Tube located in the Right nare and secured with a nasal bridle at 79 cm. Per the Cortrak monitor reading the tube tip is post-pyloric  Due to a difficult placement, X-ray ordered by Cortrak Team. Please confirm tube placement before using the Cortrak tube.   If the tube becomes dislodged please keep the tube and contact the Cortrak team at [email or web address] (password [email or web address]) for replacement.  If after hours and replacement cannot be delayed, place a NG tube and confirm placement with an abdominal x-ray.   Kerman Passey MS, RD, LDN [phone] Pager  [phone] Weekend/On-Call Pager

## 2017-05-07 NOTE — Progress Notes (Signed)
Inpatient Diabetes Program Recommendations  AACE/ADA: New Consensus Statement on Inpatient Glycemic Control (2015)  Target Ranges:  Prepandial:   less than 140 mg/dL      Peak postprandial:   less than 180 mg/dL (1-2 hours)      Critically ill patients:  140 - 180 mg/dL   Lab Results  Component Value Date   GLUCAP 266 (H) 05/07/2017   HGBA1C 6.8 (H) 05/05/2016    Review of Glycemic Control  Inpatient Diabetes Program Recommendations: Noted restarted tubefeedings. Please consider: -Novolog 4 units q 4 hrs tubefeeding coverage and hold if D/C or hold feedings.  Thank you, Nani Gasser. Delores Thelen, RN, MSN, CDE  Diabetes Coordinator Inpatient Glycemic Control Team Team Pager [phone] (8am-5pm) 05/07/2017 2:16 PM

## 2017-05-07 NOTE — Care Management Important Message (Signed)
Important Message  Patient Details  Name: [name]. MRN: [id number] Date of Birth: August 13, 1941   Medicare Important Message Given:  Yes    Kathy Wahid 05/07/2017, 11:36 AM

## 2017-05-07 NOTE — Progress Notes (Signed)
Pt BG is 516. Order to administered 20 units and contact attending MD. Awaiting for call back from physician.   Ave Filter, RN
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
## 2017-05-08 ENCOUNTER — Inpatient Hospital Stay (HOSPITAL_COMMUNITY): Payer: Medicare Other

## 2017-05-08 DIAGNOSIS — Z9289 Personal history of other medical treatment: Secondary | ICD-10-CM

## 2017-05-08 LAB — BASIC METABOLIC PANEL
Anion gap: 8 (ref 5–15)
BUN: 33 mg/dL — AB (ref 6–20)
CALCIUM: 8.8 mg/dL — AB (ref 8.9–10.3)
CHLORIDE: 101 mmol/L (ref 101–111)
CO2: 30 mmol/L (ref 22–32)
CREATININE: 1.28 mg/dL — AB (ref 0.61–1.24)
GFR calc Af Amer: >60 mL/min (ref >60)
GFR calc non Af Amer: 53 mL/min — ABNORMAL LOW (ref >60)
Glucose, Bld: 650 mg/dL (ref 65–99)
Potassium: 3.6 mmol/L (ref 3.5–5.1)
SODIUM: 139 mmol/L (ref 135–145)

## 2017-05-08 LAB — CBC WITH DIFFERENTIAL/PLATELET
Basophils Absolute: 0 10*3/uL (ref 0.0–0.1)
Basophils Relative: 0 %
EOS PCT: 0 %
Eosinophils Absolute: 0 10*3/uL (ref 0.0–0.7)
HEMATOCRIT: 29.9 % — AB (ref 39.0–52.0)
Hemoglobin: 8.5 g/dL — ABNORMAL LOW (ref 13.0–17.0)
LYMPHS ABS: 1.2 10*3/uL (ref 0.7–4.0)
Lymphocytes Relative: 8 %
MCH: 21.9 pg — ABNORMAL LOW (ref 26.0–34.0)
MCHC: 28.4 g/dL — AB (ref 30.0–36.0)
MCV: 76.9 fL — AB (ref 78.0–100.0)
MONO ABS: 0.6 10*3/uL (ref 0.1–1.0)
MONOS PCT: 4 %
NEUTROS ABS: 13.5 10*3/uL — AB (ref 1.7–7.7)
Neutrophils Relative %: 88 %
PLATELETS: 700 10*3/uL — AB (ref 150–400)
RBC: 3.89 MIL/uL — ABNORMAL LOW (ref 4.22–5.81)
RDW: 23.4 % — AB (ref 11.5–15.5)
WBC: 15.3 10*3/uL — ABNORMAL HIGH (ref 4.0–10.5)

## 2017-05-08 LAB — GLUCOSE, CAPILLARY
GLUCOSE-CAPILLARY: 153 mg/dL — AB (ref 65–99)
GLUCOSE-CAPILLARY: 157 mg/dL — AB (ref 65–99)
GLUCOSE-CAPILLARY: 173 mg/dL — AB (ref 65–99)
GLUCOSE-CAPILLARY: 173 mg/dL — AB (ref 65–99)
GLUCOSE-CAPILLARY: 339 mg/dL — AB (ref 65–99)
GLUCOSE-CAPILLARY: 345 mg/dL — AB (ref 65–99)
GLUCOSE-CAPILLARY: 451 mg/dL — AB (ref 65–99)
GLUCOSE-CAPILLARY: 570 mg/dL — AB (ref 65–99)
GLUCOSE-CAPILLARY: 589 mg/dL — AB (ref 65–99)
Glucose-Capillary: 150 mg/dL — ABNORMAL HIGH (ref 65–99)
Glucose-Capillary: 154 mg/dL — ABNORMAL HIGH (ref 65–99)
Glucose-Capillary: 241 mg/dL — ABNORMAL HIGH (ref 65–99)
Glucose-Capillary: 275 mg/dL — ABNORMAL HIGH (ref 65–99)
Glucose-Capillary: 353 mg/dL — ABNORMAL HIGH (ref 65–99)
Glucose-Capillary: 462 mg/dL — ABNORMAL HIGH (ref 65–99)
Glucose-Capillary: 515 mg/dL (ref 65–99)
Glucose-Capillary: 535 mg/dL (ref 65–99)
Glucose-Capillary: 554 mg/dL (ref 65–99)
Glucose-Capillary: >600 mg/dL (ref 65–99)

## 2017-05-08 LAB — PHOSPHORUS: PHOSPHORUS: 2.1 mg/dL — AB (ref 2.5–4.6)

## 2017-05-08 LAB — MAGNESIUM: Magnesium: 2.1 mg/dL (ref 1.7–2.4)

## 2017-05-08 MED ORDER — DEXTROSE 50 % IV SOLN
25.0000 mL | INTRAVENOUS | Status: DC | PRN
  Administered 2017-05-13 (×2): 25 mL via INTRAVENOUS

## 2017-05-08 MED ORDER — SODIUM CHLORIDE 0.9 % IV SOLN
INTRAVENOUS | Status: DC
  Administered 2017-05-08: 14 [IU]/h via INTRAVENOUS
  Administered 2017-05-08: 5.4 [IU]/h via INTRAVENOUS
  Filled 2017-05-08 (×3): qty 1

## 2017-05-08 MED ORDER — RESOURCE THICKENUP CLEAR PO POWD
ORAL | Status: DC | PRN
  Filled 2017-05-08: qty 125

## 2017-05-08 MED ORDER — DEXTROSE-NACL 5-0.45 % IV SOLN
INTRAVENOUS | Status: DC
  Administered 2017-05-08: 20:00:00 via INTRAVENOUS

## 2017-05-08 MED ORDER — INSULIN REGULAR BOLUS VIA INFUSION
0.0000 [IU] | Freq: Three times a day (TID) | INTRAVENOUS | Status: DC
  Filled 2017-05-08: qty 10

## 2017-05-08 MED ORDER — SODIUM CHLORIDE 0.9 % IV SOLN
INTRAVENOUS | Status: DC
Start: 2017-05-08 — End: 2017-05-09
  Administered 2017-05-08: 1000 mL via INTRAVENOUS
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
## 2017-05-08 NOTE — Progress Notes (Signed)
Patient blood glucose continue to maintain in the 500s. Attending MD paged. MD wants patient to start on glucose stabilizer. Awaiting for order.   Ave Filter, RN

## 2017-05-08 NOTE — Progress Notes (Signed)
Modified Barium Swallow Progress Note  Patient Details  Name: [name]. MRN: [id number] Date of Birth: [birth year]  Today's Date: 05/08/2017  Modified Barium Swallow completed.  Full report located under Chart Review in the Imaging Section.  Brief recommendations include the following:  Clinical Impression  Pt has a moderate oropharyngeal dysphagia impacted by sensorimotor and cognitive deficits. He has reduced bolus cohesion and slow oral transit, resulting in lingual residuals as well as premature spillage of liquids. His swallow trigger is delayed with thickened liquids pooling int he pyriform sinuses prior to swallow trigger. This, coupled with decreased glottal closure, allows for silent penetration of nectar thick liquids and cup sips of honey thick liquids, and pt is unable to cough to clear this despite cueing from therapist. Pt seems to have improved containment and control when he takes in honey thick liquids by spoon or by straw. Recommend Dys 1 diet and honey thick liquids by spoon or straw only, with full supervision provided to assist with small, single bites/sips. Will continue to follow.   Swallow Evaluation Recommendations       SLP Diet Recommendations: Dysphagia 1 (Puree) solids;Honey thick liquids   Liquid Administration via: Spoon;Straw;Other (Comment) (NO cup)   Medication Administration: Crushed with puree   Supervision: Staff to assist with self feeding;Full supervision/cueing for compensatory strategies   Compensations: Minimize environmental distractions;Slow rate;Small sips/bites;Follow solids with liquid   Postural Changes: Seated upright at 90 degrees   Oral Care Recommendations: Oral care BID   Other Recommendations: Have oral suction available    [name] 05/08/2017,3:54 PM   [name], M.A. CCC-SLP [phone]

## 2017-05-08 NOTE — Care Management Note (Signed)
Case Management Note  Patient Details  Name: [name]. MRN: [id number] Date of Birth: Aug 17, 1941  Subjective/Objective:     Pt admitted with Seizures. He is from home with family.               Action/Plan: Plan is for SNF when medically ready. Pt has failed swallowing eval again. Pt will need PEG or be able to have diet prior to d/c to SNF. CM following.   Expected Discharge Date:                  Expected Discharge Plan:  Skilled Nursing Facility  In-House Referral:  Clinical Social Work  Discharge planning Services  CM Consult  Post Acute Care Choice:    Choice offered to:     DME Arranged:    DME Agency:     HH Arranged:    [place] Agency:     Status of Service:  In process, will continue to follow  If discussed at Long Length of Stay Meetings, dates discussed:    Additional Comments: 05/08/2017 Pt transferred to [place] for initiation of glucose stabilizer Maryclare Labrador, RN 05/08/2017, 3:19 PM

## 2017-05-08 NOTE — Progress Notes (Signed)
Inpatient Diabetes Program Recommendations  AACE/ADA: New Consensus Statement on Inpatient Glycemic Control (2015)  Target Ranges:  Prepandial:   less than 140 mg/dL      Peak postprandial:   less than 180 mg/dL (1-2 hours)      Critically ill patients:  140 - 180 mg/dL   Lab Results  Component Value Date   GLUCAP 345 (H) 05/08/2017   HGBA1C 6.8 (H) 05/05/2016    Review of Glycemic Control: Results for [name], [name] (MRN [id number]) as of 05/08/2017 14:33  Ref. Range 05/08/2017 09:06 05/08/2017 10:25 05/08/2017 11:09 05/08/2017 12:32 05/08/2017 13:58  Glucose-Capillary Latest Ref Range: 65 - 99 mg/dL 462 (H) 451 (H) 353 (H) 339 (H) 345 (H)   Diabetes history: Type 2 diabetes Outpatient Diabetes medications: Lantus 20 units bid Current orders for Inpatient glycemic control:  IV insulin Inpatient Diabetes Program Recommendations:  Note that insulin drip started this morning due to hyperglycemia with steroids.  Insulin drip rates > 15 units/hr at this time, so would not recommend transition to subcutaneous insulin at this time until insulin drip rates lower. Will follow. Thanks, Adah Perl, RN, BC-ADM Inpatient Diabetes Coordinator Pager [phone] (8a-5p)

## 2017-05-08 NOTE — Progress Notes (Addendum)
PROGRESS NOTE    [name].  XFG:[id number] DOB: 1941-04-18 DOA: 04/25/2017 PCP: Patient, No Pcp Per    Brief Narrative:  76 y.o. male w/ h/o but not limited to CAD, stroke with residual left-sided weakness, right PCA infarct with possible left-sided visual field cuts, DM, dementia. He presented [facility] on 7/25 with AMS and as a code stroke. Apparently he had left-sided deficits per AMS. One day prior, he had a hypoglycemic event and required treatment by EMS; however, he was not transferred to hospital. Upon arrival to ED, he had a focal seizure. Neurology evaluated him at bedside and obtain CT of the head. Immediately following CT, he had left facial twitching for which he received Ativan followed by Keppra as well as Dilantin. He developed Complete heart block and associated shock. Subsequently intubated for airway protection. Prior to intubation, there was concern for aspiration event. He was evaluated by cardiology for possible pacemaker needs.  Assessment & Plan:   Active Problems:   Seizures ([place])   Acute respiratory failure with hypoxia ([facility])   Acute encephalopathy   Atelectasis   Seizure ([place])  1. Toxicmetabolic encephalopathy 1. Conversant this AM 2. Appears stable 2. Seizures 1. Previously followed by Neurology 2. Patient is continued on Keppra 3. Dilantin was stopped secondary to concerns of complete heart block 4. Remains seizure free while on floor 3. Acute respiratory failure with hypoxia 1. Required intubation this admission 2. Patient presently on minimal O2 support 3. Stable 4. Recent complete heart block 1. Remains stable thus far 2. Thought to be secondary to dilantin which was discontinued 5. Afib 1. Continue monitor on tele 2. Remains stable 6. Chronic systolic CHF 1. Presently euvolemic, mildly dehydrated on exam 2. Will hold lasix 3. Gentle IVF continued 7. Protein calorie malnutrition 1. Stable at present 2. Remains with ng tube  feeding 3. SLP continues to follow and work with patient. If unable to tolerate PO, ultimately may consider placement of PEG after one week of NG feeding 8. Leukocytosis 1. Suspect secondary to high dosed steroids started by critical care 2. D/c steroids 3. Repeat cbc in AM 9. Diabetes/hyperglycemia 1. Glucose markedly uncontrolled overnight/this AM 2. Patent started on insulin gtt and transferred to SDU 3. Likely result of high dosed steroids per above 4. Will d/c steroids without wean as pt was on steroids for less than one week 10. SVT 1. Pt noted to have bouts of SVT with HR into the 140's 2. Increased cardizem and lopressor doses. HR improved. Hold lasix per above  DVT prophylaxis: Heparin subQ Code Status: Full Family Communication: Pt in room, family not at bedside Disposition Plan: Uncertain at this time  Consultants:   Critical care  Procedures:     Antimicrobials: Anti-infectives    None      Subjective: Complaining of wanting to drink water  Objective: Vitals:   05/08/17 0013 05/08/17 0438 05/08/17 0500 05/08/17 0948  BP: 121/74 117/65  133/71  Pulse: 87 72  77  Resp: 18 18  20   Temp: 97.9 F (36.6 C) 97.9 F (36.6 C)  98.6 F (37 C)  TempSrc: Oral Oral  Axillary  SpO2: 99% 98%  99%  Weight:   67.1 kg (148 lb)   Height:        Intake/Output Summary (Last 24 hours) at 05/08/17 1306 Last data filed at 05/08/17 1153  Gross per 24 hour  Intake          2561.17 ml  Output  1100 ml  Net          1461.17 ml   Filed Weights   05/05/17 0453 05/07/17 0512 05/08/17 0500  Weight: 65.8 kg (145 lb 1 oz) 64.5 kg (142 lb 1.6 oz) 67.1 kg (148 lb)    Examination: General exam: Conversant, in no acute distress Respiratory system: normal chest rise, clear, no audible wheezing Cardiovascular system: regular rhythm, s1-s2 Gastrointestinal system: Nondistended, nontender, pos BS Central nervous system: No seizures, no tremors Extremities: No  cyanosis, no joint deformities Skin: No rashes, no pallor Psychiatry: Affect normal // no auditory hallucinations   Data Reviewed: I have personally reviewed following labs and imaging studies  CBC:  Recent Labs Lab 05/02/17 0635 05/03/17 0404 05/04/17 0242 05/05/17 0915 05/06/17 0011 05/07/17 0319 05/08/17 0402  WBC 9.3 11.5* 15.8* 22.6* 16.2* 12.5* 15.3*  NEUTROABS 6.2 8.4*  --   --  14.6* 11.0* 13.5*  HGB 7.7* 7.5* 8.9* 8.4* 7.7* 7.7* 8.5*  HCT 25.5* 26.0* 30.8* 28.1* 26.0* 26.6* 29.9*  MCV 73.3* 74.9* 75.7* 75.7* 74.9* 75.6* 76.9*  PLT 320 401* 507* 603* 588* 608* 147*   Basic Metabolic Panel:  Recent Labs Lab 05/02/17 0635 05/03/17 0404 05/03/17 2245 05/04/17 0242 05/05/17 0915 05/06/17 0011 05/07/17 0319 05/08/17 0402  NA 131* 135 136 137 137 137 141 139  K 3.6 3.6 3.8 4.3 3.7 3.7 3.5 3.6  CL 96* 97* 96* 97* 98* 100* 102 101  CO2 31 33* 34* 31 29 32 32 30  GLUCOSE 140* 137* 63* 169* 186* 371* 269* 650*  BUN 11 14 15 16  27* 28* 25* 33*  CREATININE 0.76 0.83 0.83 0.86 0.91 0.89 0.85 1.28*  CALCIUM 8.1* 8.4* 9.0 9.0 9.0 8.6* 8.8* 8.8*  MG 2.0 1.9 2.3  --   --  2.1 2.0 2.1  PHOS 2.9 3.4  --   --   --  2.1* 2.5 2.1*   GFR: Estimated Creatinine Clearance: 46.6 mL/min (A) (by C-G formula based on SCr of 1.28 mg/dL (H)). Liver Function Tests:  Recent Labs Lab 05/02/17 0635 05/03/17 0404  ALBUMIN 1.5* 1.7*   No results for input(s): LIPASE, AMYLASE in the last 168 hours. No results for input(s): AMMONIA in the last 168 hours. Coagulation Profile: No results for input(s): INR, PROTIME in the last 168 hours. Cardiac Enzymes: No results for input(s): CKTOTAL, CKMB, CKMBINDEX, TROPONINI in the last 168 hours. BNP (last 3 results) No results for input(s): PROBNP in the last 8760 hours. HbA1C: No results for input(s): HGBA1C in the last 72 hours. CBG:  Recent Labs Lab 05/08/17 0759 05/08/17 0906 05/08/17 1025 05/08/17 1109 05/08/17 1232  GLUCAP 535*  462* 451* 353* 339*   Lipid Profile: No results for input(s): CHOL, HDL, LDLCALC, TRIG, CHOLHDL, LDLDIRECT in the last 72 hours. Thyroid Function Tests: No results for input(s): TSH, T4TOTAL, FREET4, T3FREE, THYROIDAB in the last 72 hours. Anemia Panel: No results for input(s): VITAMINB12, FOLATE, FERRITIN, TIBC, IRON, RETICCTPCT in the last 72 hours. Sepsis Labs: No results for input(s): PROCALCITON, LATICACIDVEN in the last 168 hours.  No results found for this or any previous visit (from the past 240 hour(s)).   Radiology Studies: Dg Abd Portable 1v  Result Date: 05/07/2017 CLINICAL DATA:  Feeding tube placement. EXAM: PORTABLE ABDOMEN - 1 VIEW COMPARISON:  05/05/2017 FINDINGS: Feeding tube tip is in the fourth portion of the duodenum in good position. Normal bowel gas pattern.  Numerous small gallstones. IMPRESSION: Feeding tube tip in the fourth portion  of the duodenum. Cholelithiasis. Electronically Signed   By: Lorriane Shire M.D.   On: 05/07/2017 11:11    Scheduled Meds: . ARIPiprazole  2 mg Per Tube Daily  . chlorhexidine  15 mL Mouth Rinse BID  . diltiazem  90 mg Per Tube Q6H  . docusate  100 mg Per Tube BID  . feeding supplement (PRO-STAT SUGAR FREE 64)  30 mL Per Tube Daily  . heparin  5,000 Units Subcutaneous Q8H  . insulin regular  0-10 Units Intravenous TID WC  . lipase/protease/amylase  12,000 Units Oral TID WC  . mouth rinse  15 mL Mouth Rinse q12n4p  . metoprolol tartrate  5 mg Intravenous Q6H  . pantoprazole (PROTONIX) IV  40 mg Intravenous Q24H  . sennosides  5 mL Per Tube BID  . sertraline  25 mg Per Tube QHS   Continuous Infusions: . sodium chloride    . sodium chloride 1,000 mL (05/08/17 0513)  . dextrose 5 % and 0.45% NaCl 10 mL/hr at 05/05/17 1700  . dextrose 5 % and 0.45% NaCl    . feeding supplement (JEVITY 1.2 CAL) 1,000 mL (05/08/17 0554)  . insulin (NOVOLIN-R) infusion 14 Units/hr (05/08/17 1234)  . levETIRAcetam Stopped (05/08/17 1225)      LOS: 13 days   Adriella Essex, Orpah Melter, MD [facility] Pager [phone]  If 7PM-7AM, please contact night-coverage [email or web address] Password [email or web address] 05/08/2017, 1:06 PM

## 2017-05-09 DIAGNOSIS — J9621 Acute and chronic respiratory failure with hypoxia: Secondary | ICD-10-CM

## 2017-05-09 DIAGNOSIS — J9622 Acute and chronic respiratory failure with hypercapnia: Secondary | ICD-10-CM

## 2017-05-09 DIAGNOSIS — Z9911 Dependence on respirator [ventilator] status: Secondary | ICD-10-CM

## 2017-05-09 DIAGNOSIS — Z9289 Personal history of other medical treatment: Secondary | ICD-10-CM

## 2017-05-09 LAB — BASIC METABOLIC PANEL
Anion gap: 10 (ref 5–15)
BUN: 31 mg/dL — AB (ref 6–20)
CO2: 28 mmol/L (ref 22–32)
CREATININE: 0.9 mg/dL (ref 0.61–1.24)
Calcium: 8.9 mg/dL (ref 8.9–10.3)
Chloride: 106 mmol/L (ref 101–111)
GFR calc Af Amer: >60 mL/min (ref >60)
Glucose, Bld: 206 mg/dL — ABNORMAL HIGH (ref 65–99)
POTASSIUM: 3.7 mmol/L (ref 3.5–5.1)
SODIUM: 144 mmol/L (ref 135–145)

## 2017-05-09 LAB — GLUCOSE, CAPILLARY
GLUCOSE-CAPILLARY: 151 mg/dL — AB (ref 65–99)
GLUCOSE-CAPILLARY: 319 mg/dL — AB (ref 65–99)
GLUCOSE-CAPILLARY: 357 mg/dL — AB (ref 65–99)
Glucose-Capillary: 147 mg/dL — ABNORMAL HIGH (ref 65–99)
Glucose-Capillary: 171 mg/dL — ABNORMAL HIGH (ref 65–99)
Glucose-Capillary: 294 mg/dL — ABNORMAL HIGH (ref 65–99)
Glucose-Capillary: 241 mg/dL — ABNORMAL HIGH (ref 65–99)

## 2017-05-09 LAB — CBC WITH DIFFERENTIAL/PLATELET
BASOS PCT: 0 %
Basophils Absolute: 0 10*3/uL (ref 0.0–0.1)
EOS ABS: 0 10*3/uL (ref 0.0–0.7)
Eosinophils Relative: 0 %
HCT: 32.1 % — ABNORMAL LOW (ref 39.0–52.0)
Hemoglobin: 9.1 g/dL — ABNORMAL LOW (ref 13.0–17.0)
Lymphocytes Relative: 10 %
Lymphs Abs: 2 10*3/uL (ref 0.7–4.0)
MCH: 21.7 pg — AB (ref 26.0–34.0)
MCHC: 28.3 g/dL — AB (ref 30.0–36.0)
MCV: 76.4 fL — ABNORMAL LOW (ref 78.0–100.0)
MONO ABS: 1.6 10*3/uL — AB (ref 0.1–1.0)
Monocytes Relative: 8 %
Neutro Abs: 16.1 10*3/uL — ABNORMAL HIGH (ref 1.7–7.7)
Neutrophils Relative %: 82 %
PLATELETS: 666 10*3/uL — AB (ref 150–400)
RBC: 4.2 MIL/uL — ABNORMAL LOW (ref 4.22–5.81)
RDW: 23.4 % — ABNORMAL HIGH (ref 11.5–15.5)
WBC: 19.7 10*3/uL — ABNORMAL HIGH (ref 4.0–10.5)

## 2017-05-09 LAB — PHOSPHORUS: Phosphorus: 1.5 mg/dL — ABNORMAL LOW (ref 2.5–4.6)

## 2017-05-09 LAB — MAGNESIUM: MAGNESIUM: 1.8 mg/dL (ref 1.7–2.4)

## 2017-05-09 MED ORDER — INSULIN GLARGINE 100 UNIT/ML ~~LOC~~ SOLN
20.0000 [IU] | Freq: Every day | SUBCUTANEOUS | Status: DC
  Administered 2017-05-09 (×2): 20 [IU] via SUBCUTANEOUS
  Filled 2017-05-09 (×2): qty 0.2

## 2017-05-09 MED ORDER — INSULIN ASPART 100 UNIT/ML ~~LOC~~ SOLN
0.0000 [IU] | Freq: Three times a day (TID) | SUBCUTANEOUS | Status: DC
  Administered 2017-05-09: 11 [IU] via SUBCUTANEOUS
  Administered 2017-05-09: 15 [IU] via SUBCUTANEOUS
  Administered 2017-05-09: 8 [IU] via SUBCUTANEOUS
  Administered 2017-05-10: 3 [IU] via SUBCUTANEOUS
  Administered 2017-05-10: 11 [IU] via SUBCUTANEOUS
  Administered 2017-05-10: 15 [IU] via SUBCUTANEOUS
  Administered 2017-05-11: 3 [IU] via SUBCUTANEOUS
  Administered 2017-05-11: 5 [IU] via SUBCUTANEOUS
  Administered 2017-05-11: 2 [IU] via SUBCUTANEOUS
  Administered 2017-05-13: 3 [IU] via SUBCUTANEOUS
  Administered 2017-05-14 (×2): 2 [IU] via SUBCUTANEOUS

## 2017-05-09 MED ORDER — PANTOPRAZOLE SODIUM 40 MG PO PACK
40.0000 mg | PACK | Freq: Every day | ORAL | Status: DC
  Administered 2017-05-09 – 2017-05-11 (×3): 40 mg
  Filled 2017-05-09 (×3): qty 20
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
## 2017-05-09 NOTE — Progress Notes (Addendum)
Inpatient Diabetes Program Recommendations  AACE/ADA: New Consensus Statement on Inpatient Glycemic Control (2015)  Target Ranges:  Prepandial:   less than 140 mg/dL      Peak postprandial:   less than 180 mg/dL (1-2 hours)      Critically ill patients:  140 - 180 mg/dL   Results for [name], [name] (MRN [id number]) as of 05/09/2017 13:09  Ref. Range 05/09/2017 00:58 05/09/2017 02:08 05/09/2017 03:16 05/09/2017 08:11 05/09/2017 12:20  Glucose-Capillary Latest Ref Range: 65 - 99 mg/dL 151 (H) 147 (H) 171 (H) 319 (H) 357 (H)    Home DM Meds: Lantus 20 units BID  Current Insulin Orders: Lantus 20 units QHS      Novolog Moderate Correction Scale/ SSI (0-15 units) TID AC     MD- Note Lantus 20 units given at 3am today to transition pt off IV Insulin drip.  CBG by 8am was elevated to 319 mg/dl.  CBG by 12pm today elevated to 357 mg/dl.  Last dose Decadron given yesterday at 5am.  Note that patient takes larger dose of Lantus at home (20 units BID).  Please consider the following insulin adjustments:  Increase Lantus to 20 units BID (home dose)     --Will follow patient during hospitalization--  Wyn Quaker RN, MSN, CDE Diabetes Coordinator Inpatient Glycemic Control Team Team Pager: [phone] (8a-5p)
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
## 2017-05-09 NOTE — Care Management Note (Signed)
Case Management Note  Patient Details  Name: [name]. MRN: [id number] Date of Birth: April 19, 1941  Subjective/Objective:     Pt admitted with Seizures. He is from home with family.               Action/Plan: Plan is for SNF when medically ready. Pt has failed swallowing eval again. Pt will need PEG or be able to have diet prior to d/c to SNF. CM following.   Expected Discharge Date:                  Expected Discharge Plan:  Skilled Nursing Facility  In-House Referral:  Clinical Social Work  Discharge planning Services  CM Consult  Post Acute Care Choice:    Choice offered to:     DME Arranged:    DME Agency:     HH Arranged:    [place] Agency:     Status of Service:  In process, will continue to follow  If discussed at Long Length of Stay Meetings, dates discussed:    Additional Comments: 05/09/2017  Pt now started on dysphagia diet, getting intermittent IV PRN Lopressor  05/08/17 Pt transferred to [place] for initiation of glucose stabilizer Maryclare Labrador, RN 05/09/2017, 9:31 AM

## 2017-05-09 NOTE — Clinical Social Work Note (Signed)
Clinical Social Work Assessment  Patient Details  Name: [name]. MRN: [id number] Date of Birth: 1941-08-04  Date of referral:  05/09/17               Reason for consult:  Facility Placement, Discharge Planning                Permission sought to share information with:  Facility Sport and exercise psychologist, Family Supports Permission granted to share information::  Yes, Verbal Permission Granted  Name::     Freight forwarder::  SNF  Relationship::  Daughter  Contact Information:     Housing/Transportation Living arrangements for the past 2 months:  Single Family Home Source of Information:  Adult Children Patient Interpreter Needed:  None Criminal Activity/Legal Involvement Pertinent to Current Situation/Hospitalization:  No - Comment as needed Significant Relationships:  Adult Children Lives with:  Self, Adult Children Do you feel safe going back to the place where you live?  Yes Need for family participation in patient care:  Yes (Comment) (patient not fully oriented)  Care giving concerns:  Patient has been living at home with adult son, but requires short term rehab stay at discharge to improve functioning and ability to perform ADLs.   Social Worker assessment / plan:  CSW spoke with patient's daughter, [name], over the phone to discuss recommendation for SNF placement. Patient's daughter indicated that patient had been at [facility] in the past and would like patient to admit there when ready. Patient's daughter asked about timeliness of discharge, and CSW indicated that medical team is still completing work up, but will let the patient's daughter know when he's medically ready to transition to SNF. CSW will complete referral and fax referral for placement.  Employment status:  Retired Forensic scientist:  Medicare PT Recommendations:  [place] / Referral to community resources:  [place]  Patient/Family's Response to care:   Patient's daughter agreeable to SNF placement for patient.  Patient/Family's Understanding of and Emotional Response to Diagnosis, Current Treatment, and Prognosis:  Patient's daughter aware of patient's diagnosis and need for additional care at this time. Patient's daughter discussed how the patient has never been this bad before in his life, in terms of his physical health. Patient's daughter is concerned about the patient's medical status, but also hopeful that the patient will recover and be able to return home.  Emotional Assessment Appearance:  Appears stated age Attitude/Demeanor/Rapport:  Unable to Assess Affect (typically observed):  Unable to Assess Orientation:  Oriented to Self Alcohol / Substance use:  Not Applicable Psych involvement (Current and /or in the community):  No (Comment)  Discharge Needs  Concerns to be addressed:  Care Coordination, Discharge Planning Concerns Readmission within the last 30 days:  No Current discharge risk:  Physical Impairment, Cognitively Impaired Barriers to Discharge:  Continued Medical Work up   [employer], [place] 05/09/2017, 12:18 PM

## 2017-05-10 ENCOUNTER — Inpatient Hospital Stay (HOSPITAL_COMMUNITY): Payer: Medicare Other

## 2017-05-10 LAB — COMPREHENSIVE METABOLIC PANEL WITH GFR
ALT: 31 U/L (ref 17–63)
AST: 28 U/L (ref 15–41)
Albumin: 1.8 g/dL — ABNORMAL LOW (ref 3.5–5.0)
Alkaline Phosphatase: 94 U/L (ref 38–126)
Anion gap: 9 (ref 5–15)
BUN: 24 mg/dL — ABNORMAL HIGH (ref 6–20)
CO2: 27 mmol/L (ref 22–32)
Calcium: 8.6 mg/dL — ABNORMAL LOW (ref 8.9–10.3)
Chloride: 107 mmol/L (ref 101–111)
Creatinine, Ser: 0.88 mg/dL (ref 0.61–1.24)
GFR calc Af Amer: >60 mL/min
GFR calc non Af Amer: >60 mL/min
Glucose, Bld: 379 mg/dL — ABNORMAL HIGH (ref 65–99)
Potassium: 4 mmol/L (ref 3.5–5.1)
Sodium: 143 mmol/L (ref 135–145)
Total Bilirubin: 0.5 mg/dL (ref 0.3–1.2)
Total Protein: 5.8 g/dL — ABNORMAL LOW (ref 6.5–8.1)

## 2017-05-10 LAB — CBC WITH DIFFERENTIAL/PLATELET
Basophils Absolute: 0 10*3/uL (ref 0.0–0.1)
Basophils Relative: 0 %
Eosinophils Absolute: 0.2 10*3/uL (ref 0.0–0.7)
Eosinophils Relative: 1 %
HCT: 31.6 % — ABNORMAL LOW (ref 39.0–52.0)
Hemoglobin: 9.3 g/dL — ABNORMAL LOW (ref 13.0–17.0)
Lymphocytes Relative: 12 %
Lymphs Abs: 2.7 10*3/uL (ref 0.7–4.0)
MCH: 22.7 pg — ABNORMAL LOW (ref 26.0–34.0)
MCHC: 29.4 g/dL — ABNORMAL LOW (ref 30.0–36.0)
MCV: 77.3 fL — ABNORMAL LOW (ref 78.0–100.0)
Monocytes Absolute: 1.3 10*3/uL — ABNORMAL HIGH (ref 0.1–1.0)
Monocytes Relative: 6 %
Neutro Abs: 18.1 10*3/uL — ABNORMAL HIGH (ref 1.7–7.7)
Neutrophils Relative %: 81 %
Platelets: 623 10*3/uL — ABNORMAL HIGH (ref 150–400)
RBC: 4.09 MIL/uL — ABNORMAL LOW (ref 4.22–5.81)
RDW: 24 % — ABNORMAL HIGH (ref 11.5–15.5)
WBC: 22.3 10*3/uL — ABNORMAL HIGH (ref 4.0–10.5)

## 2017-05-10 LAB — GLUCOSE, CAPILLARY
GLUCOSE-CAPILLARY: 186 mg/dL — AB (ref 65–99)
GLUCOSE-CAPILLARY: 158 mg/dL — AB (ref 65–99)
GLUCOSE-CAPILLARY: 151 mg/dL — AB (ref 65–99)
Glucose-Capillary: 325 mg/dL — ABNORMAL HIGH (ref 65–99)
Glucose-Capillary: 360 mg/dL — ABNORMAL HIGH (ref 65–99)
Glucose-Capillary: 326 mg/dL — ABNORMAL HIGH (ref 65–99)

## 2017-05-10 LAB — BLOOD CULTURE ID PANEL (REFLEXED)
ACINETOBACTER BAUMANNII: NOT DETECTED
CANDIDA ALBICANS: NOT DETECTED
CANDIDA GLABRATA: NOT DETECTED
CANDIDA KRUSEI: NOT DETECTED
Candida parapsilosis: NOT DETECTED
Candida tropicalis: NOT DETECTED
Carbapenem resistance: NOT DETECTED
ENTEROBACTER CLOACAE COMPLEX: NOT DETECTED
ESCHERICHIA COLI: NOT DETECTED
Enterobacteriaceae species: NOT DETECTED
Enterococcus species: NOT DETECTED
HAEMOPHILUS INFLUENZAE: NOT DETECTED
Klebsiella oxytoca: NOT DETECTED
Klebsiella pneumoniae: NOT DETECTED
Listeria monocytogenes: NOT DETECTED
METHICILLIN RESISTANCE: NOT DETECTED
NEISSERIA MENINGITIDIS: NOT DETECTED
Proteus species: NOT DETECTED
Pseudomonas aeruginosa: NOT DETECTED
STREPTOCOCCUS AGALACTIAE: NOT DETECTED
STREPTOCOCCUS PNEUMONIAE: NOT DETECTED
STREPTOCOCCUS PYOGENES: NOT DETECTED
STREPTOCOCCUS SPECIES: NOT DETECTED
Serratia marcescens: NOT DETECTED
Staphylococcus aureus (BCID): NOT DETECTED
Staphylococcus species: NOT DETECTED
Vancomycin resistance: NOT DETECTED

## 2017-05-10 MED ORDER — CIPROFLOXACIN IN D5W 400 MG/200ML IV SOLN
400.0000 mg | Freq: Once | INTRAVENOUS | Status: AC
  Administered 2017-05-10: 400 mg via INTRAVENOUS
  Filled 2017-05-10 (×2): qty 200

## 2017-05-10 MED ORDER — INSULIN GLARGINE 100 UNIT/ML ~~LOC~~ SOLN
20.0000 [IU] | Freq: Two times a day (BID) | SUBCUTANEOUS | Status: DC
  Administered 2017-05-10 – 2017-05-15 (×11): 20 [IU] via SUBCUTANEOUS
  Filled 2017-05-10 (×12): qty 0.2

## 2017-05-10 NOTE — Progress Notes (Signed)
Nutrition Follow-up  Late entry for 05/09/17 due to downtime  DOCUMENTATION CODES:   Not applicable  INTERVENTION:   -Continue Jevity 1.2 @ 60 ml/hr via cortrak tube  30 ml Prostat daily    Tube feeding regimen provides 1828 kcal (100% of needs), 95 grams of protein, and 1166 ml of H2O.    NUTRITION DIAGNOSIS:   Inadequate oral intake related to dysphagia as evidenced by meal completion < 25% (TF dependent).  Ongoing  GOAL:   Patient will meet greater than or equal to 90% of their needs  Met with TF  MONITOR:   TF tolerance, Diet advancement, I & O's  REASON FOR ASSESSMENT:   Consult Enteral/tube feeding initiation and management  ASSESSMENT:    76 yo male admitted with VDRF due to inability to protect airway in setting of seizures and AMS, possible aspiration during intubation. Pt with hx of dementia, CHF, DM, old CVA, HTN, ulcerative esophagitis  8/3 pt extubated 8/4 failed swallow eval, Cortrak placed (tip proximal duodenum) 8/6- cortrak tube dislodged, replaced (tip of tube confirmed by KUB in fourth portion of duodenum) 8/7- s/p MBSS, advanced to dysphagia 1 diet with honey thick liquids  Pt confused at time of visit. Staff report pt struggles with PO intake. Meal completion 5%.   Pt remains with cortrak tube. Jevity 1.2 infusing @ 60 ml/hr (with 30 ml Prostat daily), which provides 1828 kcal, 95 grams protein, and 1166 ml free water, meeting 100% of estimated kcal and protein needs). Pt tolerating TF well, however, pt may require permanent feeding access if unable to consume adequate nutrition via PO route.   Labs reviewed: CBGS: 241-360.   Diet Order:  DIET - DYS 1 Room service appropriate? Yes; Fluid consistency: Honey Thick  Skin:  Reviewed, no issues  Last BM:  05/08/17  Height:   Ht Readings from Last 1 Encounters:  04/26/17 6' (1.829 m)    Weight:   Wt Readings from Last 1 Encounters:  05/10/17 165 lb (74.8 kg)    Ideal Body Weight:  80.9  kg  BMI:  Body mass index is 22.38 kg/m.  Estimated Nutritional Needs:   Kcal:  1700-1900  Protein:  80-100 grams  Fluid:  >/= 1.7 L/day  EDUCATION NEEDS:   No education needs identified at this time  Saman Umstead A. Jimmye Norman, RD, LDN, CDE Pager: [phone] After hours Pager: [phone]

## 2017-05-10 NOTE — Care Management Note (Signed)
Case Management Note  Patient Details  Name: [name]. MRN: [id number] Date of Birth: [birth year]  Subjective/Objective:     Pt admitted with Seizures. He is from home with family.               Action/Plan: Plan is for SNF when medically ready. Pt has failed swallowing eval again. Pt will need PEG or be able to have diet prior to d/c to SNF. CM following.   Expected Discharge Date:                  Expected Discharge Plan:  Skilled Nursing Facility  In-House Referral:  Clinical Social Work  Discharge planning Services  CM Consult  Post Acute Care Choice:    Choice offered to:     DME Arranged:    DME Agency:     HH Arranged:    [place] Agency:     Status of Service:  In process, will continue to follow  If discussed at Long Length of Stay Meetings, dates discussed:    Additional Comments: 05/10/2017  Discussed in LOS 05/10/17 - appropriate for continued stay.  Pt is now on dyphagia diet - per bedside not yet fully tolerating therefore pt may still need PEG prior to placement.  CM will continue to follow for discharge planning  05/09/17 Pt now started on dysphagia diet, getting intermittent IV PRN Lopressor  05/08/17 Pt transferred to [place] for initiation of glucose stabilizer Maryclare Labrador, RN 05/10/2017, 8:51 AM
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
## 2017-05-10 NOTE — Progress Notes (Signed)
  PHARMACY - PHYSICIAN COMMUNICATION CRITICAL VALUE ALERT - BLOOD CULTURE IDENTIFICATION (BCID)  Results for orders placed or performed during the hospital encounter of 04/25/17  Blood Culture ID Panel (Reflexed) (Collected: 05/09/2017 10:21 AM)  Result Value Ref Range   Enterococcus species NOT DETECTED NOT DETECTED   Vancomycin resistance NOT DETECTED NOT DETECTED   Listeria monocytogenes NOT DETECTED NOT DETECTED   Staphylococcus species NOT DETECTED NOT DETECTED   Staphylococcus aureus NOT DETECTED NOT DETECTED   Methicillin resistance NOT DETECTED NOT DETECTED   Streptococcus species NOT DETECTED NOT DETECTED   Streptococcus agalactiae NOT DETECTED NOT DETECTED   Streptococcus pneumoniae NOT DETECTED NOT DETECTED   Streptococcus pyogenes NOT DETECTED NOT DETECTED   Acinetobacter baumannii NOT DETECTED NOT DETECTED   Enterobacteriaceae species NOT DETECTED NOT DETECTED   Enterobacter cloacae complex NOT DETECTED NOT DETECTED   Escherichia coli NOT DETECTED NOT DETECTED   Klebsiella oxytoca NOT DETECTED NOT DETECTED   Klebsiella pneumoniae NOT DETECTED NOT DETECTED   Proteus species NOT DETECTED NOT DETECTED   Serratia marcescens NOT DETECTED NOT DETECTED   Carbapenem resistance NOT DETECTED NOT DETECTED   Haemophilus influenzae NOT DETECTED NOT DETECTED   Neisseria meningitidis NOT DETECTED NOT DETECTED   Pseudomonas aeruginosa NOT DETECTED NOT DETECTED   Candida albicans NOT DETECTED NOT DETECTED   Candida glabrata NOT DETECTED NOT DETECTED   Candida krusei NOT DETECTED NOT DETECTED   Candida parapsilosis NOT DETECTED NOT DETECTED   Candida tropicalis NOT DETECTED NOT DETECTED    Name of physician (or Provider) Contacted: Dr. Rodena Piety  Changes to prescribed antibiotics required: None - likely contaminant. MD planning to monitor the patient and follow-up culture results.   Alycia Rossetti, PharmD, BCPS Pager: [phone] 1:27 PM

## 2017-05-10 NOTE — Progress Notes (Signed)
MD paged over [phone] CBG.  MD ok with this blood sugar for now, will let day shift adjust insulin schedule based on diabetic coordinator's recommendations in the morning. No new orders received.

## 2017-05-11 LAB — CBC WITH DIFFERENTIAL/PLATELET
BASOS ABS: 0 10*3/uL (ref 0.0–0.1)
Basophils Relative: 0 %
Eosinophils Absolute: 0.2 10*3/uL (ref 0.0–0.7)
Eosinophils Relative: 1 %
HEMATOCRIT: 30.6 % — AB (ref 39.0–52.0)
HEMOGLOBIN: 9.3 g/dL — AB (ref 13.0–17.0)
LYMPHS ABS: 2.3 10*3/uL (ref 0.7–4.0)
LYMPHS PCT: 15 %
MCH: 23 pg — ABNORMAL LOW (ref 26.0–34.0)
MCHC: 30.4 g/dL (ref 30.0–36.0)
MCV: 75.6 fL — ABNORMAL LOW (ref 78.0–100.0)
MONOS PCT: 8 %
Monocytes Absolute: 1.2 10*3/uL — ABNORMAL HIGH (ref 0.1–1.0)
NEUTROS PCT: 76 %
Neutro Abs: 11.7 10*3/uL — ABNORMAL HIGH (ref 1.7–7.7)
Platelets: 342 10*3/uL (ref 150–400)
RBC: 4.05 MIL/uL — AB (ref 4.22–5.81)
RDW: 23.5 % — AB (ref 11.5–15.5)
WBC: 15.4 10*3/uL — AB (ref 4.0–10.5)

## 2017-05-11 LAB — CULTURE, BLOOD (ROUTINE X 2): SPECIAL REQUESTS: ADEQUATE

## 2017-05-11 LAB — GLUCOSE, CAPILLARY
GLUCOSE-CAPILLARY: 198 mg/dL — AB (ref 65–99)
GLUCOSE-CAPILLARY: 116 mg/dL — AB (ref 65–99)
GLUCOSE-CAPILLARY: 113 mg/dL — AB (ref 65–99)
Glucose-Capillary: 166 mg/dL — ABNORMAL HIGH (ref 65–99)
Glucose-Capillary: 228 mg/dL — ABNORMAL HIGH (ref 65–99)
Glucose-Capillary: 147 mg/dL — ABNORMAL HIGH (ref 65–99)

## 2017-05-11 MED ORDER — JEVITY 1.2 CAL PO LIQD
1000.0000 mL | ORAL | Status: DC
  Administered 2017-05-11 – 2017-05-14 (×3): 1000 mL
  Filled 2017-05-11 (×8): qty 1000

## 2017-05-11 MED ORDER — MIRTAZAPINE 7.5 MG PO TABS
7.5000 mg | ORAL_TABLET | Freq: Every day | ORAL | Status: DC
  Administered 2017-05-11 – 2017-05-14 (×4): 7.5 mg
  Filled 2017-05-11 (×4): qty 1

## 2017-05-11 MED ORDER — LEVETIRACETAM 500 MG/5ML IV SOLN
500.0000 mg | INTRAVENOUS | Status: DC
  Administered 2017-05-12 – 2017-05-14 (×3): 500 mg via INTRAVENOUS
  Filled 2017-05-11 (×3): qty 5

## 2017-05-11 MED ORDER — CIPROFLOXACIN IN D5W 400 MG/200ML IV SOLN
400.0000 mg | Freq: Once | INTRAVENOUS | Status: AC
  Administered 2017-05-11: 400 mg via INTRAVENOUS
  Filled 2017-05-11: qty 200
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
## 2017-05-11 NOTE — Progress Notes (Signed)
PROGRESS NOTE    [name].  GLO:[id number] DOB: 04-04-1941 DOA: 04/25/2017 PCP: Patient, No Pcp Per   Brief Narrative: (Start on day 1 of progress note - keep it brief and live) 75 yo male with hypoglycemia and seizures. Patient has been seizure free since last few days. Patient received Dilantin followed by complete heart block. Dilantin was stopped Keppra was started. Patient was intubated for airway protection. Patient is currently having difficulty dysphagia or dysphonia followed by speech therapy. He failed swallow evaluation. Patient has an NG tube for feeding. Discussed with patient's daughter yesterday. If he doesn't do well with feeding will need to consider PEG tube placement.   Assessment & Plan:   Active Problems:   Seizures ([place])   Acute respiratory failure with hypoxia ([facility])   Acute encephalopathy   Atelectasis   Seizure (H[facility])   Ventilator dependent (H[facility])   History of ETT  New-onset seizure secondary to hypoglycemia. Patient started on Keppra by the admitting team. Patient probably will not need Keppra for the seizures as the cause of the seizure has been corrected. Decrease his dose to 500 once a day and DC Keppra.  Status post acute respiratory failure with hypoxia secondary to seizure.  Severe protein calorie malnutrition currently on NG tube feedings.  History of CVA patient not on any anticoagulation at this time nor take anything at home. I will start him on a baby aspirin 81 mg daily.  Acute on ckd. His renal functions have been normalized after IV hydration.  Type 2 diabetes on Lantus and so on insulin insulin lispro 3 times a day . Blood sugars better.  Hypertension stable on metoprolol.    DVT prophylaxis: heparin Code Status: (Full/Partial  Family Communication: discussed with daughter Disposition Plan: snf   Consultants: none   Procedures: ngt  Antimicrobials: cipro   Subjective: Asking for thin water  Objective: Vitals:   05/11/17 0303 05/11/17 0347 05/11/17 0757 05/11/17 1131  BP: (!) 154/75  (!) 148/81 (!) 162/81  Pulse: 80  77   Resp: (!) 26  (!) 25 (!) 28  Temp: 98.7 F (37.1 C)  98.3 F (36.8 C)   TempSrc: Axillary  Oral   SpO2: 96%  96%   Weight:  78.9 kg (174 lb)    Height:        Intake/Output Summary (Last 24 hours) at 05/11/17 1239 Last data filed at 05/11/17 0801  Gross per 24 hour  Intake             2570 ml  Output             1075 ml  Net             1495 ml   Filed Weights   05/09/17 0500 05/10/17 0500 05/11/17 0347  Weight: 71.2 kg (157 lb) 74.8 kg (165 lb) 78.9 kg (174 lb)    Examination:  General exam: Appears calm and comfortable  Respiratory system: Clear to auscultation. Respiratory effort normal. Cardiovascular system: S1 & S2 heard, RRR. No JVD, murmurs, rubs, gallops or clicks. No pedal edema. Gastrointestinal system: Abdomen is nondistended, soft and nontender. No organomegaly or masses felt. Normal bowel sounds heard. Central nervous system: Alert and oriented. No focal neurological deficits. Extremities: Symmetric 5 x 5 power. Skin: No rashes, lesions or ulcers Psychiatry: Judgement and insight appear normal. Mood & affect appropriate.     Data Reviewed: I have personally reviewed following labs and imaging studies  CBC:  Recent Labs  Lab 05/06/17 0011 05/07/17 0319 05/08/17 0402 05/09/17 0532 05/10/17 1014  WBC 16.2* 12.5* 15.3* 19.7* 22.3*  NEUTROABS 14.6* 11.0* 13.5* 16.1* 18.1*  HGB 7.7* 7.7* 8.5* 9.1* 9.3*  HCT 26.0* 26.6* 29.9* 32.1* 31.6*  MCV 74.9* 75.6* 76.9* 76.4* 77.3*  PLT 588* 608* 700* 666* 229*   Basic Metabolic Panel:  Recent Labs Lab 05/06/17 0011 05/07/17 0319 05/08/17 0402 05/09/17 0532 05/10/17 1014  NA 137 141 139 144 143  K 3.7 3.5 3.6 3.7 4.0  CL 100* 102 101 106 107  CO2 32 32 30 28 27   GLUCOSE 371* 269* 650* 206* 379*  BUN 28* 25* 33* 31* 24*  CREATININE 0.89 0.85 1.28* 0.90 0.88  CALCIUM 8.6* 8.8* 8.8* 8.9 8.6*    MG 2.1 2.0 2.1 1.8  --   PHOS 2.1* 2.5 2.1* 1.5*  --    GFR: Estimated Creatinine Clearance: 78.4 mL/min (by C-G formula based on SCr of 0.88 mg/dL). Liver Function Tests:  Recent Labs Lab 05/10/17 1014  AST 28  ALT 31  ALKPHOS 94  BILITOT 0.5  PROT 5.8*  ALBUMIN 1.8*   No results for input(s): LIPASE, AMYLASE in the last 168 hours. No results for input(s): AMMONIA in the last 168 hours. Coagulation Profile: No results for input(s): INR, PROTIME in the last 168 hours. Cardiac Enzymes: No results for input(s): CKTOTAL, CKMB, CKMBINDEX, TROPONINI in the last 168 hours. BNP (last 3 results) No results for input(s): PROBNP in the last 8760 hours. HbA1C: No results for input(s): HGBA1C in the last 72 hours. CBG:  Recent Labs Lab 05/10/17 1641 05/10/17 2027 05/10/17 2306 05/11/17 0345 05/11/17 0805  GLUCAP 186* 158* 151* 166* 198*   Lipid Profile: No results for input(s): CHOL, HDL, LDLCALC, TRIG, CHOLHDL, LDLDIRECT in the last 72 hours. Thyroid Function Tests: No results for input(s): TSH, T4TOTAL, FREET4, T3FREE, THYROIDAB in the last 72 hours. Anemia Panel: No results for input(s): VITAMINB12, FOLATE, FERRITIN, TIBC, IRON, RETICCTPCT in the last 72 hours. Sepsis Labs: No results for input(s): PROCALCITON, LATICACIDVEN in the last 168 hours.  Recent Results (from the past 240 hour(s))  Culture, blood (routine x 2)     Status: None (Preliminary result)   Collection Time: 05/09/17 10:20 AM  Result Value Ref Range Status   Specimen Description BLOOD LEFT HAND  Final   Special Requests IN PEDIATRIC BOTTLE Blood Culture adequate volume  Final   Culture NO GROWTH 2 DAYS  Final   Report Status PENDING  Incomplete  Culture, blood (routine x 2)     Status: None (Preliminary result)   Collection Time: 05/09/17 10:21 AM  Result Value Ref Range Status   Specimen Description BLOOD RIGHT ARM  Final   Special Requests IN PEDIATRIC BOTTLE Blood Culture adequate volume  Final    Culture  Setup Time   Final    GRAM POSITIVE COCCI AEROBIC BOTTLE ONLY Organism ID to follow CRITICAL RESULT CALLED TO, READ BACK BY AND VERIFIED WITH: Alvino Chapel.D. 11:05 05/10/17 ([email or web address])    Culture GRAM POSITIVE COCCI IDENTIFICATION TO FOLLOW   Final   Report Status PENDING  Incomplete  Blood Culture ID Panel (Reflexed)     Status: None   Collection Time: 05/09/17 10:21 AM  Result Value Ref Range Status   Enterococcus species NOT DETECTED NOT DETECTED Final   Vancomycin resistance NOT DETECTED NOT DETECTED Final   Listeria monocytogenes NOT DETECTED NOT DETECTED Final   Staphylococcus species NOT DETECTED NOT DETECTED Final  Staphylococcus aureus NOT DETECTED NOT DETECTED Final   Methicillin resistance NOT DETECTED NOT DETECTED Final   Streptococcus species NOT DETECTED NOT DETECTED Final   Streptococcus agalactiae NOT DETECTED NOT DETECTED Final   Streptococcus pneumoniae NOT DETECTED NOT DETECTED Final   Streptococcus pyogenes NOT DETECTED NOT DETECTED Final   Acinetobacter baumannii NOT DETECTED NOT DETECTED Final   Enterobacteriaceae species NOT DETECTED NOT DETECTED Final   Enterobacter cloacae complex NOT DETECTED NOT DETECTED Final   Escherichia coli NOT DETECTED NOT DETECTED Final   Klebsiella oxytoca NOT DETECTED NOT DETECTED Final   Klebsiella pneumoniae NOT DETECTED NOT DETECTED Final   Proteus species NOT DETECTED NOT DETECTED Final   Serratia marcescens NOT DETECTED NOT DETECTED Final   Carbapenem resistance NOT DETECTED NOT DETECTED Final   Haemophilus influenzae NOT DETECTED NOT DETECTED Final   Neisseria meningitidis NOT DETECTED NOT DETECTED Final   Pseudomonas aeruginosa NOT DETECTED NOT DETECTED Final   Candida albicans NOT DETECTED NOT DETECTED Final   Candida glabrata NOT DETECTED NOT DETECTED Final   Candida krusei NOT DETECTED NOT DETECTED Final   Candida parapsilosis NOT DETECTED NOT DETECTED Final   Candida tropicalis NOT DETECTED NOT  DETECTED Final  Culture, Urine     Status: Abnormal (Preliminary result)   Collection Time: 05/09/17  9:20 PM  Result Value Ref Range Status   Specimen Description URINE, RANDOM  Final   Special Requests NONE  Final   Culture >=100,000 COLONIES/mL ESCHERICHIA COLI (A)  Final   Report Status PENDING  Incomplete         Radiology Studies: Dg Chest 2 View  Result Date: 05/10/2017 CLINICAL DATA:  Bandemia. EXAM: CHEST  2 VIEW COMPARISON:  Chest x-ray dated May 05, 2017. FINDINGS: Interval placement of an enteric tube with the tip beyond the field of view. Unchanged cardiomegaly with stable bibasilar hazy opacities, likely reflecting small pleural effusions with adjacent atelectasis. No pneumothorax. No acute osseous abnormality. IMPRESSION: Stable cardiomegaly and small bilateral pleural effusions with adjacent atelectasis. Electronically Signed   By: Titus Dubin M.D.   On: 05/10/2017 14:15        Scheduled Meds: . ARIPiprazole  2 mg Per Tube Daily  . chlorhexidine  15 mL Mouth Rinse BID  . diltiazem  90 mg Per Tube Q6H  . docusate  100 mg Per Tube BID  . feeding supplement (PRO-STAT SUGAR FREE 64)  30 mL Per Tube Daily  . heparin  5,000 Units Subcutaneous Q8H  . insulin aspart  0-15 Units Subcutaneous TID WC  . insulin glargine  20 Units Subcutaneous BID  . lipase/protease/amylase  12,000 Units Oral TID WC  . mouth rinse  15 mL Mouth Rinse q12n4p  . metoprolol tartrate  5 mg Intravenous Q6H  . pantoprazole sodium  40 mg Per Tube Daily  . sennosides  5 mL Per Tube BID  . sertraline  25 mg Per Tube QHS   Continuous Infusions: . sodium chloride    . feeding supplement (JEVITY 1.2 CAL) 1,000 mL (05/11/17 0328)  . levETIRAcetam Stopped (05/11/17 1517)     LOS: 16 days    Time spent: 33 min    Georgette Shell, MD [facility] Pager [phone]  If 7PM-7AM, please contact night-coverage [email or web address] Password [facility] 05/11/2017, 12:39 PM

## 2017-05-12 LAB — GLUCOSE, CAPILLARY
GLUCOSE-CAPILLARY: 98 mg/dL (ref 65–99)
Glucose-Capillary: 90 mg/dL (ref 65–99)
Glucose-Capillary: 73 mg/dL (ref 65–99)
Glucose-Capillary: 118 mg/dL — ABNORMAL HIGH (ref 65–99)
Glucose-Capillary: 143 mg/dL — ABNORMAL HIGH (ref 65–99)
Glucose-Capillary: 154 mg/dL — ABNORMAL HIGH (ref 65–99)

## 2017-05-12 LAB — URINE CULTURE: Culture: >=100000 — AB

## 2017-05-12 MED ORDER — CEPHALEXIN 500 MG PO CAPS: 500.0000 mg | ORAL_CAPSULE | Freq: Every day | ORAL | Status: DC

## 2017-05-12 MED ORDER — CEPHALEXIN 500 MG PO CAPS
500.0000 mg | ORAL_CAPSULE | Freq: Two times a day (BID) | ORAL | Status: DC
  Administered 2017-05-12 – 2017-05-15 (×7): 500 mg via ORAL
  Filled 2017-05-12 (×7): qty 1

## 2017-05-12 MED ORDER — CIPROFLOXACIN IN D5W 400 MG/200ML IV SOLN
400.0000 mg | Freq: Two times a day (BID) | INTRAVENOUS | Status: DC
  Administered 2017-05-12 – 2017-05-13 (×3): 400 mg via INTRAVENOUS
  Filled 2017-05-12 (×3): qty 200
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
## 2017-05-12 NOTE — Progress Notes (Signed)
Patient reports that he is hungry but when he tastes the food items we have he doesn't want more. He had a bite of everything on his tray but then only wanted to eat the apple sauce. He didn't want to drink anything. Will continue to encourage patient to eat and drink.

## 2017-05-12 NOTE — Progress Notes (Signed)
Notified by central tele that patient had a 6 beat run of vtach this morning. Notified Dr. Rodena Piety. Will continue to monitor.

## 2017-05-13 LAB — GLUCOSE, CAPILLARY
GLUCOSE-CAPILLARY: 120 mg/dL — AB (ref 65–99)
GLUCOSE-CAPILLARY: 116 mg/dL — AB (ref 65–99)
GLUCOSE-CAPILLARY: 174 mg/dL — AB (ref 65–99)
GLUCOSE-CAPILLARY: 49 mg/dL — AB (ref 65–99)
GLUCOSE-CAPILLARY: 87 mg/dL (ref 65–99)
GLUCOSE-CAPILLARY: 140 mg/dL — AB (ref 65–99)
Glucose-Capillary: 45 mg/dL — ABNORMAL LOW (ref 65–99)
Glucose-Capillary: 124 mg/dL — ABNORMAL HIGH (ref 65–99)

## 2017-05-13 MED ORDER — POTASSIUM & SODIUM PHOSPHATES 280-160-250 MG PO PACK
1.0000 | PACK | Freq: Four times a day (QID) | ORAL | Status: AC
  Administered 2017-05-13 – 2017-05-14 (×4): 1
  Filled 2017-05-13 (×4): qty 1

## 2017-05-13 MED ORDER — DEXTROSE 50 % IV SOLN
INTRAVENOUS | Status: AC
  Filled 2017-05-13: qty 50

## 2017-05-13 MED ORDER — DEXTROSE 50 % IV SOLN
INTRAVENOUS | Status: AC
  Administered 2017-05-13: 25 mL via INTRAVENOUS
  Filled 2017-05-13: qty 50

## 2017-05-13 MED ORDER — MAGNESIUM SULFATE 2 GM/50ML IV SOLN
2.0000 g | Freq: Once | INTRAVENOUS | Status: AC
  Administered 2017-05-13: 2 g via INTRAVENOUS
  Filled 2017-05-13: qty 50

## 2017-05-13 NOTE — Progress Notes (Signed)
CRITICAL VALUE ALERT  Critical Value:  CBG 45  Date & Time Notied:  05/13/2017 1550  Provider Notified: Dr. Zigmund Daniel  Orders Received/Actions taken: had already followed protocol and treated with IV dextrose which brought blood sugar back up to 87. Dr. Zigmund Daniel ordering repeat IV dextrose to be given.
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
## 2017-05-14 ENCOUNTER — Inpatient Hospital Stay (HOSPITAL_COMMUNITY): Payer: Medicare Other

## 2017-05-14 LAB — GLUCOSE, CAPILLARY
GLUCOSE-CAPILLARY: 119 mg/dL — AB (ref 65–99)
GLUCOSE-CAPILLARY: 132 mg/dL — AB (ref 65–99)
Glucose-Capillary: 137 mg/dL — ABNORMAL HIGH (ref 65–99)
Glucose-Capillary: 188 mg/dL — ABNORMAL HIGH (ref 65–99)

## 2017-05-14 LAB — CULTURE, BLOOD (ROUTINE X 2)
Culture: NO GROWTH
SPECIAL REQUESTS: ADEQUATE

## 2017-05-14 MED ORDER — LEVETIRACETAM 100 MG/ML PO SOLN
500.0000 mg | Freq: Every day | ORAL | Status: DC
  Administered 2017-05-15: 500 mg
  Filled 2017-05-14: qty 5
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
## 2017-05-14 NOTE — Progress Notes (Signed)
Inpatient Diabetes Program Recommendations  AACE/ADA: New Consensus Statement on Inpatient Glycemic Control (2015)  Target Ranges:  Prepandial:   less than 140 mg/dL      Peak postprandial:   less than 180 mg/dL (1-2 hours)      Critically ill patients:  140 - 180 mg/dL   Results for JUANCARLOS, CRESCENZO (MRN [id number]) as of 05/14/2017 11:16  Ref. Range 05/13/2017 08:04 05/13/2017 11:56 05/13/2017 15:44 05/13/2017 16:05 05/13/2017 16:31 05/13/2017 18:08 05/13/2017 20:35 05/14/2017 07:49  Glucose-Capillary Latest Ref Range: 65 - 99 mg/dL 116 (H)  Lantus 20 units 174 (H)  Novolog 3 units 45 (L) 49 (L) 87 124 (H) 140 (H)  Lantus 20 units 119 (H)   Review of Glycemic Control  Current orders for Inpatient glycemic control: Lantus 20 units BID, Novolog 0-15 units TID with meals  Inpatient Diabetes Program Recommendations:  Correction (SSI): Please consider decreasing Novolog correction to Sensitive scale 0-9 units and consider changing frequency of CBGs and Novolog to Q4H if patient continues to have poor PO intake and remain on tube feedings.  Thanks, Barnie Alderman, RN, MSN, CDE Diabetes Coordinator Inpatient Diabetes Program [phone] (Team Pager from 8am to 5pm)
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
## 2017-05-14 NOTE — Progress Notes (Signed)
Pharmacy IV to PO Communication Note:  Given that patient is tolerating his medications via tube and has had no recent seizure activity, will plan on switching IV Keppra to via tube per P&T policy.   Albertina Parr, PharmD., BCPS Clinical Pharmacist Pager [phone]

## 2017-05-14 NOTE — Clinical Social Work Note (Signed)
CSW continues to follow for discharge needs.  Kerryann Allaire, CSW [phone]  
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
## 2017-05-15 DIAGNOSIS — Z7189 Other specified counseling: Secondary | ICD-10-CM

## 2017-05-15 DIAGNOSIS — R627 Adult failure to thrive: Secondary | ICD-10-CM

## 2017-05-15 DIAGNOSIS — Z515 Encounter for palliative care: Secondary | ICD-10-CM

## 2017-05-15 LAB — GLUCOSE, CAPILLARY
GLUCOSE-CAPILLARY: 86 mg/dL (ref 65–99)
Glucose-Capillary: 73 mg/dL (ref 65–99)
Glucose-Capillary: 113 mg/dL — ABNORMAL HIGH (ref 65–99)

## 2017-05-15 MED ORDER — CEPHALEXIN 500 MG PO CAPS: 500.0000 mg | ORAL_CAPSULE | Freq: Two times a day (BID) | ORAL | 0 refills | Status: AC

## 2017-05-15 MED ORDER — INSULIN GLARGINE 100 UNIT/ML ~~LOC~~ SOLN: 15.0000 [IU] | Freq: Every day | SUBCUTANEOUS | 1 refills | Status: DC

## 2017-05-15 MED ORDER — PANTOPRAZOLE SODIUM 40 MG PO TBEC: 40.0000 mg | DELAYED_RELEASE_TABLET | Freq: Every day | ORAL | 1 refills | Status: DC

## 2017-05-15 MED ORDER — MIRTAZAPINE 7.5 MG PO TABS: 7.5000 mg | ORAL_TABLET | Freq: Every day | ORAL | 0 refills | Status: DC

## 2017-05-15 MED ORDER — JEVITY 1.2 CAL PO LIQD
1000.0000 mL | ORAL | Status: DC
  Administered 2017-05-15: 1000 mL
  Filled 2017-05-15 (×2): qty 1000

## 2017-05-15 MED ORDER — LEVETIRACETAM 500 MG PO TABS: 500.0000 mg | ORAL_TABLET | Freq: Two times a day (BID) | ORAL | 0 refills | Status: DC

## 2017-05-15 MED ORDER — PANCRELIPASE (LIP-PROT-AMYL) 12000-38000 UNITS PO CPEP: 12000.0000 [IU] | ORAL_CAPSULE | Freq: Three times a day (TID) | ORAL | 0 refills | Status: DC

## 2017-05-15 MED ORDER — JEVITY 1.2 CAL PO LIQD: 1000.0000 mL | ORAL | 0 refills | Status: DC

## 2017-05-15 MED ORDER — METOPROLOL TARTRATE 25 MG PO TABS: 12.5000 mg | ORAL_TABLET | Freq: Every day | ORAL | 11 refills | Status: DC

## 2017-05-15 MED ORDER — SERTRALINE HCL 25 MG PO TABS: 25.0000 mg | ORAL_TABLET | Freq: Every day | ORAL | Status: DC

## 2017-05-15 NOTE — Progress Notes (Signed)
Daughter [name] made aware about the transfer to [facility] today

## 2017-05-15 NOTE — Progress Notes (Signed)
Discharged to [facility] by ambulance. Stable. NG tube clamped. Discharge summary, prescription, EMTALA form, and clothing given to EMTs. Report given to [facility] nurse.

## 2017-05-15 NOTE — Clinical Social Work Note (Signed)
Patient has orders to discharge to [facility] today.  CSW signing off.   Marshal Eskew, CSW [phone]  

## 2017-05-15 NOTE — Progress Notes (Signed)
Nutrition Follow-up  DOCUMENTATION CODES:   Not applicable  INTERVENTION:   -Modify TF regimen to nocturnal feedings of Jevity 1.2 @ 60 ml/hr via cortrak tube over 12 hour period  30 ml Prostat daily    Tube feeding regimen provides 964 kcal (58% of needs), 55 grams of protein, and 583 ml of H2O.   NUTRITION DIAGNOSIS:   Inadequate oral intake related to dysphagia as evidenced by meal completion < 25% (TF dependent).  Ongoing  GOAL:   Patient will meet greater than or equal to 90% of their needs  Progressing  MONITOR:   TF tolerance, Diet advancement, I & O's  REASON FOR ASSESSMENT:   Consult Enteral/tube feeding initiation and management  ASSESSMENT:    76 yo male admitted with VDRF due to inability to protect airway in setting of seizures and AMS, possible aspiration during intubation. Pt with hx of dementia, CHF, DM, old CVA, HTN, ulcerative esophagitis  8/3 pt extubated 8/4 failed swallow eval, Cortrak placed (tip proximal duodenum) 8/6- cortrak tube dislodged, replaced (tip of tube confirmed by KUB in fourth portion of duodenum) 8/7- s/p MBSS, advanced to dysphagia 1 diet with honey thick liquids 8/11- TF decreased by MD to help stimulate appetite (Jevity 1.2 @ 40 ml/hr)  Case discussed with Palliative Care NP (Sarah); she is planning a family meeting this afternoon to discuss goals of care. Confirmed that pt was receiving continuous TF of Jevity 1.2 @ 40 ml/hr with 30 ml Prostat daily (providing 1252 kcals, 68 grams protein, and 775 ml free water daily- meeting 74% of estimated kcal needs and 85% of estimated energy needs). Meal intake remains minimal on a dysphagia 1 diet with honey thick liquids (PO: 0-15%). Discussed with NP recommendation of transition to nocturnal feedings to help stimulate appetite and PO intake, of which NP was in agreement.   Case discussed with RN, who reports pt with minimal intake. Per RN, pt refused breakfast this morning; pt  continued to refuse despite attempts with encouragement (pt requires feeding assistance).   Spoke with pt, who was more alert and conversant from last visit. Pt reports "I didn't get any breakfast" and reports "my swallowing is fine". Pt reports being hungry currently, asking this RD "what's for lunch today?". He denies any nausea, vomiting, or abdominal pain. Discussed need for cortrak tube due to minimal intake and plan to adjust feeding regimen to stimulate appetite, however, unsure how much pt retained from conversation.   Labs reviewed: CBGS: 73-188.   Diet Order:  DIET - DYS 1 Room service appropriate? Yes; Fluid consistency: Honey Thick  Skin:  Reviewed, no issues  Last BM:  05/14/17  Height:   Ht Readings from Last 1 Encounters:  04/26/17 6' (1.829 m)    Weight:   Wt Readings from Last 1 Encounters:  05/15/17 163 lb (73.9 kg)    Ideal Body Weight:  80.9 kg  BMI:  Body mass index is 22.11 kg/m.  Estimated Nutritional Needs:   Kcal:  1700-1900  Protein:  80-100 grams  Fluid:  >/= 1.7 L/day  EDUCATION NEEDS:   No education needs identified at this time  Sherral Dirocco A. Jimmye Norman, RD, LDN, CDE Pager: [phone] After hours Pager: [phone]
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
## 2017-05-15 NOTE — Care Management Note (Addendum)
Case Management Note  Patient Details  Name: [name]. MRN: [id number] Date of Birth: [birth year]  Subjective/Objective:     Pt admitted with Seizures. He is from home with family.               Action/Plan: Plan is for SNF when medically ready. Pt has failed swallowing eval again. Pt will need PEG or be able to have diet prior to d/c to SNF. CM following.   Expected Discharge Date:                  Expected Discharge Plan:  Skilled Nursing Facility  In-House Referral:  Clinical Social Work  Discharge planning Services  CM Consult  Post Acute Care Choice:    Choice offered to:     DME Arranged:    DME Agency:     HH Arranged:    [place] Agency:     Status of Service:  In process, will continue to follow  If discussed at Long Length of Stay Meetings, dates discussed:    Additional Comments: 05/15/2017  Discharge written for [place] following and will communicate directly with unit specifics for discharge  Attending and [facility] liaison in direct contact regarding probable LTACH discharge today.  CM contacted by attending and informed that attending feels that discharge to [facility] is appropriate - CM informed attending that case was discussed with Physician Advisor today and LTACH was deemed not appropriate (lack of intensity) and therefore CM can not facilitate referral.  Attending informed CM that she would speak directly with next of kin regarding LTACH and would follow up with [facility]'s liaison.  Discussed in LOS 8/14 - Physician Advisor informed CSW that there are 2 SNFs in the area that can accept cortrak.  Palliative Care consulted this am - CM spoke with them directly.  Pt has been placed on dysphagia diet however has very low intake and continues to require tube feeds, family refusing PEG.  CSW to follow up with attending  05/10/17 Discussed in LOS 05/10/17 - appropriate for continued stay.  Pt is now on dyphagia diet - per bedside not yet fully  tolerating therefore pt may still need PEG prior to placement.  CM will continue to follow for discharge planning  05/09/17 Pt now started on dysphagia diet, getting intermittent IV PRN Lopressor  05/08/17 Pt transferred to [facility] for initiation of glucose stabilizer Maryclare Labrador, RN 05/15/2017, 9:09 AM

## 2017-05-15 NOTE — Progress Notes (Deleted)
K+-2.7, MG++-1.8 Md made aware with order.

## 2017-05-15 NOTE — Discharge Summary (Addendum)
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 DETECTED NOT DETECTED Final   Staphylococcus aureus NOT DETECTED NOT DETECTED Final   Methicillin resistance NOT DETECTED NOT DETECTED Final   Streptococcus species NOT DETECTED NOT DETECTED Final   Streptococcus agalactiae NOT DETECTED NOT DETECTED Final   Streptococcus pneumoniae NOT DETECTED NOT DETECTED Final   Streptococcus pyogenes NOT DETECTED NOT DETECTED Final   Acinetobacter baumannii NOT DETECTED NOT DETECTED Final   Enterobacteriaceae species NOT DETECTED NOT DETECTED Final   Enterobacter cloacae complex NOT DETECTED NOT DETECTED Final   Escherichia coli NOT DETECTED NOT DETECTED Final   Klebsiella oxytoca NOT DETECTED NOT DETECTED Final   Klebsiella pneumoniae NOT DETECTED NOT DETECTED Final   Proteus species NOT DETECTED NOT DETECTED Final   Serratia marcescens NOT DETECTED NOT DETECTED Final   Carbapenem resistance NOT DETECTED NOT DETECTED Final   Haemophilus influenzae NOT DETECTED NOT DETECTED Final   Neisseria meningitidis NOT DETECTED NOT DETECTED Final   Pseudomonas aeruginosa NOT DETECTED NOT DETECTED Final   Candida albicans NOT DETECTED NOT DETECTED Final   Candida glabrata NOT DETECTED NOT DETECTED Final   Candida krusei NOT DETECTED NOT  DETECTED Final   Candida parapsilosis NOT DETECTED NOT DETECTED Final   Candida tropicalis NOT DETECTED NOT DETECTED Final  Culture, Urine     Status: Abnormal   Collection Time: 05/09/17  9:20 PM  Result Value Ref Range Status   Specimen Description URINE, RANDOM  Final   Special Requests NONE  Final   Culture >=100,000 COLONIES/mL ESCHERICHIA COLI (A)  Final   Report Status 05/12/2017 FINAL  Final   Organism ID, Bacteria ESCHERICHIA COLI (A)  Final      Susceptibility   Escherichia coli - MIC*    AMPICILLIN >=32 RESISTANT Resistant     CEFAZOLIN 16 SENSITIVE Sensitive     CEFTRIAXONE <=1 SENSITIVE Sensitive     CIPROFLOXACIN <=0.25 SENSITIVE Sensitive     GENTAMICIN <=1 SENSITIVE Sensitive     IMIPENEM <=0.25 SENSITIVE Sensitive     NITROFURANTOIN <=16 SENSITIVE Sensitive     TRIMETH/SULFA <=20 SENSITIVE Sensitive     AMPICILLIN/SULBACTAM >=32 RESISTANT Resistant     PIP/TAZO <=4 SENSITIVE Sensitive     Extended ESBL NEGATIVE Sensitive     * >=100,000 COLONIES/mL  ESCHERICHIA COLI     Labs: BNP (last 3 results) No results for input(s): BNP in the last 8760 hours. Basic Metabolic Panel:  Recent Labs Lab 05/09/17 0532 05/10/17 1014  NA 144 143  K 3.7 4.0  CL 106 107  CO2 28 27  GLUCOSE 206* 379*  BUN 31* 24*  CREATININE 0.90 0.88  CALCIUM 8.9 8.6*  MG 1.8  --   PHOS 1.5*  --    Liver Function Tests:  Recent Labs Lab 05/10/17 1014  AST 28  ALT 31  ALKPHOS 94  BILITOT 0.5  PROT 5.8*  ALBUMIN 1.8*   No results for input(s): LIPASE, AMYLASE in the last 168 hours. No results for input(s): AMMONIA in the last 168 hours. CBC:  Recent Labs Lab 05/09/17 0532 05/10/17 1014 05/11/17 1520  WBC 19.7* 22.3* 15.4*  NEUTROABS 16.1* 18.1* 11.7*  HGB 9.1* 9.3* 9.3*  HCT 32.1* 31.6* 30.6*  MCV 76.4* 77.3* 75.6*  PLT 666* 623* 342   Cardiac Enzymes: No results for input(s): CKTOTAL, CKMB, CKMBINDEX, TROPONINI in the last 168 hours. BNP: Invalid input(s):  POCBNP CBG:  Recent Labs Lab 05/14/17 1115 05/14/17 1645 05/14/17 2019 05/15/17 0816 05/15/17 1142  GLUCAP 132* 137* 188* 73 86   D-Dimer No results for input(s): DDIMER in the last 72 hours. Hgb A1c No results for input(s): HGBA1C in the last 72 hours. Lipid Profile No results for input(s): CHOL, HDL, LDLCALC, TRIG, CHOLHDL, LDLDIRECT in the last 72 hours. Thyroid function studies No results for input(s): TSH, T4TOTAL, T3FREE, THYROIDAB in the last 72 hours.  Invalid input(s): FREET3 Anemia work up No results for input(s): VITAMINB12, FOLATE, FERRITIN, TIBC, IRON, RETICCTPCT in the last 72 hours. Urinalysis    Component Value Date/Time   COLORURINE YELLOW 05/10/2016 1741   APPEARANCEUR CLOUDY (A) 05/10/2016 1741   LABSPEC 1.042 (H) 05/10/2016 1741   PHURINE 5.0 05/10/2016 1741   GLUCOSEU NEGATIVE 05/10/2016 1741   HGBUR MODERATE (A) 05/10/2016 1741   BILIRUBINUR NEGATIVE 05/10/2016 1741   KETONESUR NEGATIVE 05/10/2016 1741   PROTEINUR NEGATIVE 05/10/2016 1741   UROBILINOGEN 0.2 10/01/2014 1818   NITRITE NEGATIVE 05/10/2016 1741   LEUKOCYTESUR MODERATE (A) 05/10/2016 1741   Sepsis Labs Invalid input(s): PROCALCITONIN,  WBC,  LACTICIDVEN Microbiology Recent Results (from the past 240 hour(s))  Culture, blood (routine x 2)     Status: None   Collection Time: 05/09/17 10:20 AM  Result Value Ref Range Status   Specimen Description BLOOD LEFT HAND  Final   Special Requests IN PEDIATRIC BOTTLE Blood Culture adequate volume  Final   Culture NO GROWTH 5 DAYS  Final   Report Status 05/14/2017 FINAL  Final  Culture, blood (routine x 2)     Status: Abnormal   Collection Time: 05/09/17 10:21 AM  Result Value Ref Range Status   Specimen Description BLOOD RIGHT ARM  Final   Special Requests IN PEDIATRIC BOTTLE Blood Culture adequate volume  Final   Culture  Setup Time   Final    GRAM POSITIVE COCCI AEROBIC BOTTLE ONLY Organism ID to follow CRITICAL RESULT CALLED TO, READ  BACK BY AND VERIFIED WITH: Alvino Chapel.D. 11:05 05/10/17 ([email or web address])    Culture (A)  Final    STAPHYLOCOCCUS SPECIES (COAGULASE NEGATIVE) THE SIGNIFICANCE OF ISOLATING THIS ORGANISM FROM A SINGLE SET OF BLOOD CULTURES WHEN MULTIPLE SETS ARE DRAWN IS UNCERTAIN. PLEASE NOTIFY THE MICROBIOLOGY DEPARTMENT WITHIN ONE WEEK IF SPECIATION AND SENSITIVITIES ARE REQUIRED.    Report Status 05/11/2017 FINAL  Final  Blood Culture ID Panel (Reflexed)     Status: None   Collection Time: 05/09/17 10:21 AM  Result Value Ref Range Status   Enterococcus species NOT DETECTED NOT DETECTED Final   Vancomycin resistance NOT DETECTED NOT DETECTED Final   Listeria monocytogenes NOT DETECTED NOT DETECTED Final   Staphylococcus species NOT DETECTED NOT DETECTED Final   Staphylococcus aureus NOT DETECTED NOT DETECTED Final   Methicillin resistance NOT DETECTED NOT DETECTED Final   Streptococcus species NOT DETECTED NOT DETECTED Final   Streptococcus agalactiae NOT DETECTED NOT DETECTED Final   Streptococcus pneumoniae NOT DETECTED NOT DETECTED Final   Streptococcus pyogenes NOT DETECTED NOT DETECTED Final   Acinetobacter baumannii NOT DETECTED NOT DETECTED Final   Enterobacteriaceae species NOT DETECTED NOT DETECTED Final   Enterobacter cloacae complex NOT DETECTED NOT DETECTED Final   Escherichia coli NOT DETECTED NOT DETECTED Final   Klebsiella oxytoca NOT DETECTED NOT DETECTED Final   Klebsiella pneumoniae NOT DETECTED NOT DETECTED Final   Proteus species NOT DETECTED NOT DETECTED Final   Serratia marcescens NOT DETECTED NOT DETECTED Final   Carbapenem resistance NOT DETECTED NOT DETECTED Final   Haemophilus influenzae NOT DETECTED NOT DETECTED Final   Neisseria meningitidis NOT DETECTED NOT DETECTED Final   Pseudomonas aeruginosa NOT DETECTED NOT DETECTED Final   Candida albicans NOT DETECTED NOT DETECTED Final   Candida glabrata NOT DETECTED NOT DETECTED Final   Candida krusei NOT DETECTED NOT DETECTED  Final   Candida parapsilosis NOT DETECTED NOT DETECTED Final   Candida tropicalis NOT DETECTED NOT DETECTED Final  Culture, Urine     Status: Abnormal   Collection Time: 05/09/17  9:20 PM  Result Value Ref Range Status   Specimen Description URINE, RANDOM  Final   Special Requests NONE  Final   Culture >=100,000 COLONIES/mL ESCHERICHIA COLI (A)  Final   Report Status 05/12/2017 FINAL  Final   Organism ID, Bacteria ESCHERICHIA COLI (A)  Final      Susceptibility   Escherichia coli - MIC*    AMPICILLIN >=32 RESISTANT Resistant     CEFAZOLIN 16 SENSITIVE Sensitive     CEFTRIAXONE <=1 SENSITIVE Sensitive     CIPROFLOXACIN <=0.25 SENSITIVE Sensitive     GENTAMICIN <=1 SENSITIVE Sensitive     IMIPENEM <=0.25 SENSITIVE Sensitive     NITROFURANTOIN <=16 SENSITIVE Sensitive     TRIMETH/SULFA <=20 SENSITIVE Sensitive     AMPICILLIN/SULBACTAM >=32 RESISTANT Resistant     PIP/TAZO <=4 SENSITIVE Sensitive     Extended ESBL NEGATIVE Sensitive     * >=100,000 COLONIES/mL ESCHERICHIA COLI     Time coordinating discharge: Over 30 minutes  SIGNED:   Georgette Shell, MD  [facility] 05/15/2017, 1:28 PM Pager   If 7PM-7AM, please contact night-coverage [email or web address] Password [email or web address]

## 2017-05-31 ENCOUNTER — Encounter: Payer: Self-pay | Admitting: Adult Health

## 2017-05-31 ENCOUNTER — Non-Acute Institutional Stay (SKILLED_NURSING_FACILITY): Payer: Medicare Other | Admitting: Adult Health

## 2017-05-31 DIAGNOSIS — K861 Other chronic pancreatitis: Secondary | ICD-10-CM | POA: Diagnosis not present

## 2017-05-31 DIAGNOSIS — R634 Abnormal weight loss: Secondary | ICD-10-CM

## 2017-05-31 DIAGNOSIS — I10 Essential (primary) hypertension: Secondary | ICD-10-CM | POA: Diagnosis not present

## 2017-05-31 DIAGNOSIS — E43 Unspecified severe protein-calorie malnutrition: Secondary | ICD-10-CM

## 2017-05-31 DIAGNOSIS — R569 Unspecified convulsions: Secondary | ICD-10-CM | POA: Diagnosis not present

## 2017-05-31 DIAGNOSIS — F323 Major depressive disorder, single episode, severe with psychotic features: Secondary | ICD-10-CM

## 2017-05-31 DIAGNOSIS — E1169 Type 2 diabetes mellitus with other specified complication: Secondary | ICD-10-CM | POA: Diagnosis not present

## 2017-05-31 DIAGNOSIS — R5381 Other malaise: Secondary | ICD-10-CM

## 2017-05-31 DIAGNOSIS — K2210 Ulcer of esophagus without bleeding: Secondary | ICD-10-CM | POA: Diagnosis not present

## 2017-05-31 DIAGNOSIS — M069 Rheumatoid arthritis, unspecified: Secondary | ICD-10-CM | POA: Diagnosis not present

## 2017-05-31 DIAGNOSIS — G9340 Encephalopathy, unspecified: Secondary | ICD-10-CM | POA: Diagnosis not present

## 2017-05-31 DIAGNOSIS — E1149 Type 2 diabetes mellitus with other diabetic neurological complication: Secondary | ICD-10-CM | POA: Diagnosis not present

## 2017-05-31 DIAGNOSIS — E785 Hyperlipidemia, unspecified: Secondary | ICD-10-CM
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
## 2017-06-01 LAB — HEPATIC FUNCTION PANEL
ALK PHOS: 99 (ref 25–125)
ALT: 11 (ref 10–40)
AST: 14 (ref 14–40)
BILIRUBIN, TOTAL: 0.4

## 2017-06-01 LAB — LIPID PANEL
Cholesterol: 98 (ref 0–200)
HDL: 46 (ref 35–70)
LDL CALC: 36
TRIGLYCERIDES: 82 (ref 40–160)

## 2017-06-01 LAB — CBC AND DIFFERENTIAL
HEMATOCRIT: 32 — AB (ref 41–53)
HEMOGLOBIN: 9.4 — AB (ref 13.5–17.5)
NEUTROS ABS: 6
PLATELETS: 457 — AB (ref 150–399)
WBC: 9.2

## 2017-06-01 LAB — BASIC METABOLIC PANEL
BUN: 19 (ref 4–21)
Creatinine: 1 (ref 0.6–1.3)
GLUCOSE: 129
Potassium: 3.8 (ref 3.4–5.3)
SODIUM: 134 — AB (ref 137–147)

## 2017-06-01 LAB — HEMOGLOBIN A1C: Hemoglobin A1C: 6.9

## 2017-06-07 ENCOUNTER — Non-Acute Institutional Stay (SKILLED_NURSING_FACILITY): Payer: Medicare Other | Admitting: Internal Medicine

## 2017-06-07 ENCOUNTER — Encounter: Payer: Self-pay | Admitting: Internal Medicine

## 2017-06-07 DIAGNOSIS — I10 Essential (primary) hypertension: Secondary | ICD-10-CM

## 2017-06-07 DIAGNOSIS — E878 Other disorders of electrolyte and fluid balance, not elsewhere classified: Secondary | ICD-10-CM

## 2017-06-07 DIAGNOSIS — Z794 Long term (current) use of insulin: Secondary | ICD-10-CM | POA: Diagnosis not present

## 2017-06-07 DIAGNOSIS — E1165 Type 2 diabetes mellitus with hyperglycemia: Secondary | ICD-10-CM | POA: Diagnosis not present

## 2017-06-07 DIAGNOSIS — R5381 Other malaise: Secondary | ICD-10-CM | POA: Diagnosis not present

## 2017-06-07 DIAGNOSIS — F323 Major depressive disorder, single episode, severe with psychotic features: Secondary | ICD-10-CM

## 2017-06-07 DIAGNOSIS — Z8673 Personal history of transient ischemic attack (TIA), and cerebral infarction without residual deficits: Secondary | ICD-10-CM | POA: Diagnosis not present

## 2017-06-07 DIAGNOSIS — M069 Rheumatoid arthritis, unspecified: Secondary | ICD-10-CM

## 2017-06-07 DIAGNOSIS — R569 Unspecified convulsions: Secondary | ICD-10-CM

## 2017-06-07 DIAGNOSIS — E43 Unspecified severe protein-calorie malnutrition: Secondary | ICD-10-CM

## 2017-06-07 DIAGNOSIS — K861 Other chronic pancreatitis: Secondary | ICD-10-CM
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
## 2017-06-11 ENCOUNTER — Non-Acute Institutional Stay (SKILLED_NURSING_FACILITY): Payer: Medicare Other | Admitting: Adult Health

## 2017-06-11 ENCOUNTER — Encounter: Payer: Self-pay | Admitting: Adult Health
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
## 2017-06-21 ENCOUNTER — Non-Acute Institutional Stay (SKILLED_NURSING_FACILITY): Payer: Medicare Other | Admitting: Adult Health

## 2017-06-21 ENCOUNTER — Encounter: Payer: Self-pay | Admitting: Adult Health

## 2017-06-21 DIAGNOSIS — E1149 Type 2 diabetes mellitus with other diabetic neurological complication: Secondary | ICD-10-CM | POA: Diagnosis not present
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
## 2017-06-25 ENCOUNTER — Non-Acute Institutional Stay (SKILLED_NURSING_FACILITY): Payer: Medicare Other | Admitting: Adult Health

## 2017-06-25 ENCOUNTER — Encounter: Payer: Self-pay | Admitting: Adult Health

## 2017-06-25 DIAGNOSIS — E1149 Type 2 diabetes mellitus with other diabetic neurological complication: Secondary | ICD-10-CM | POA: Diagnosis not present

## 2017-06-25 DIAGNOSIS — R569 Unspecified convulsions: Secondary | ICD-10-CM | POA: Diagnosis not present

## 2017-06-25 DIAGNOSIS — I10 Essential (primary) hypertension: Secondary | ICD-10-CM | POA: Diagnosis not present

## 2017-06-25 DIAGNOSIS — K861 Other chronic pancreatitis: Secondary | ICD-10-CM | POA: Diagnosis not present

## 2017-06-25 DIAGNOSIS — F323 Major depressive disorder, single episode, severe with psychotic features: Secondary | ICD-10-CM

## 2017-06-25 DIAGNOSIS — K2210 Ulcer of esophagus without bleeding: Secondary | ICD-10-CM | POA: Diagnosis not present
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
## 2017-06-27 ENCOUNTER — Encounter: Payer: Self-pay | Admitting: Adult Health

## 2017-06-27 NOTE — Progress Notes (Signed)
Entered in error

## 2017-07-11 ENCOUNTER — Encounter: Payer: Self-pay | Admitting: Adult Health

## 2017-07-11 ENCOUNTER — Non-Acute Institutional Stay (SKILLED_NURSING_FACILITY): Payer: Medicare Other | Admitting: Adult Health

## 2017-07-11 DIAGNOSIS — R569 Unspecified convulsions: Secondary | ICD-10-CM | POA: Diagnosis not present

## 2017-07-11 DIAGNOSIS — Z716 Tobacco abuse counseling: Secondary | ICD-10-CM | POA: Diagnosis not present

## 2017-07-11 DIAGNOSIS — Z8673 Personal history of transient ischemic attack (TIA), and cerebral infarction without residual deficits: Secondary | ICD-10-CM | POA: Diagnosis not present

## 2017-07-11 DIAGNOSIS — K861 Other chronic pancreatitis: Secondary | ICD-10-CM
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
## 2017-07-17 ENCOUNTER — Emergency Department (HOSPITAL_COMMUNITY)
Admission: EM | Admit: 2017-07-17 | Discharge: 2017-07-17 | Disposition: A | Discharge status: Home / Self Care | Payer: Medicare Other | Attending: Emergency Medicine | Admitting: Emergency Medicine

## 2017-07-17 ENCOUNTER — Encounter (HOSPITAL_COMMUNITY): Payer: Self-pay | Admitting: Nurse Practitioner

## 2017-07-17 ENCOUNTER — Emergency Department (HOSPITAL_COMMUNITY): Payer: Medicare Other

## 2017-07-17 DIAGNOSIS — Z7982 Long term (current) use of aspirin: Secondary | ICD-10-CM | POA: Diagnosis not present

## 2017-07-17 DIAGNOSIS — F0390 Unspecified dementia without behavioral disturbance: Secondary | ICD-10-CM | POA: Insufficient documentation

## 2017-07-17 DIAGNOSIS — Z8673 Personal history of transient ischemic attack (TIA), and cerebral infarction without residual deficits: Secondary | ICD-10-CM | POA: Diagnosis not present

## 2017-07-17 DIAGNOSIS — J449 Chronic obstructive pulmonary disease, unspecified: Secondary | ICD-10-CM | POA: Insufficient documentation

## 2017-07-17 DIAGNOSIS — I2510 Atherosclerotic heart disease of native coronary artery without angina pectoris: Secondary | ICD-10-CM | POA: Diagnosis not present

## 2017-07-17 DIAGNOSIS — Z794 Long term (current) use of insulin: Secondary | ICD-10-CM | POA: Insufficient documentation

## 2017-07-17 DIAGNOSIS — E1149 Type 2 diabetes mellitus with other diabetic neurological complication: Secondary | ICD-10-CM | POA: Diagnosis not present

## 2017-07-17 DIAGNOSIS — Z79899 Other long term (current) drug therapy: Secondary | ICD-10-CM | POA: Insufficient documentation

## 2017-07-17 DIAGNOSIS — F17210 Nicotine dependence, cigarettes, uncomplicated: Secondary | ICD-10-CM | POA: Diagnosis not present

## 2017-07-17 DIAGNOSIS — I10 Essential (primary) hypertension: Secondary | ICD-10-CM | POA: Diagnosis not present

## 2017-07-17 DIAGNOSIS — R42 Dizziness and giddiness: Secondary | ICD-10-CM | POA: Diagnosis not present

## 2017-07-17 DIAGNOSIS — R112 Nausea with vomiting, unspecified: Secondary | ICD-10-CM | POA: Diagnosis not present

## 2017-07-17 LAB — COMPREHENSIVE METABOLIC PANEL
ALT: 11 U/L — ABNORMAL LOW (ref 17–63)
AST: 20 U/L (ref 15–41)
Albumin: 3.1 g/dL — ABNORMAL LOW (ref 3.5–5.0)
Alkaline Phosphatase: 101 U/L (ref 38–126)
Anion gap: 9 (ref 5–15)
BUN: 16 mg/dL (ref 6–20)
CO2: 29 mmol/L (ref 22–32)
Calcium: 9.4 mg/dL (ref 8.9–10.3)
Chloride: 101 mmol/L (ref 101–111)
Creatinine, Ser: 1.11 mg/dL (ref 0.61–1.24)
GFR calc Af Amer: >60 mL/min (ref >60)
GFR calc non Af Amer: >60 mL/min (ref >60)
Glucose, Bld: 244 mg/dL — ABNORMAL HIGH (ref 65–99)
Potassium: 3.3 mmol/L — ABNORMAL LOW (ref 3.5–5.1)
Sodium: 139 mmol/L (ref 135–145)
Total Bilirubin: 0.7 mg/dL (ref 0.3–1.2)
Total Protein: 7.6 g/dL (ref 6.5–8.1)

## 2017-07-17 LAB — CBC WITH DIFFERENTIAL/PLATELET
BASOS ABS: 0 10*3/uL (ref 0.0–0.1)
Basophils Relative: 0 %
EOS ABS: 0.1 10*3/uL (ref 0.0–0.7)
Eosinophils Relative: 1 %
HEMATOCRIT: 34.2 % — AB (ref 39.0–52.0)
Hemoglobin: 10.4 g/dL — ABNORMAL LOW (ref 13.0–17.0)
LYMPHS ABS: 1.8 10*3/uL (ref 0.7–4.0)
Lymphocytes Relative: 13 %
MCH: 22.7 pg — ABNORMAL LOW (ref 26.0–34.0)
MCHC: 30.4 g/dL (ref 30.0–36.0)
MCV: 74.5 fL — ABNORMAL LOW (ref 78.0–100.0)
MONOS PCT: 6 %
Monocytes Absolute: 0.8 10*3/uL (ref 0.1–1.0)
Neutro Abs: 10.8 10*3/uL — ABNORMAL HIGH (ref 1.7–7.7)
Neutrophils Relative %: 80 %
Platelets: 399 10*3/uL (ref 150–400)
RBC: 4.59 MIL/uL (ref 4.22–5.81)
RDW: 17.5 % — AB (ref 11.5–15.5)
WBC: 13.5 10*3/uL — AB (ref 4.0–10.5)

## 2017-07-17 LAB — LIPASE, BLOOD: Lipase: 16 U/L (ref 11–51)

## 2017-07-17 MED ORDER — ONDANSETRON HCL 4 MG/2ML IJ SOLN
4.0000 mg | Freq: Once | INTRAMUSCULAR | Status: AC
  Administered 2017-07-17: 4 mg via INTRAVENOUS
  Filled 2017-07-17: qty 2

## 2017-07-17 MED ORDER — PANTOPRAZOLE SODIUM 40 MG IV SOLR
40.0000 mg | Freq: Once | INTRAVENOUS | Status: AC
  Administered 2017-07-17: 40 mg via INTRAVENOUS
  Filled 2017-07-17: qty 40

## 2017-07-17 MED ORDER — SODIUM CHLORIDE 0.9 % IV BOLUS (SEPSIS)
500.0000 mL | Freq: Once | INTRAVENOUS | Status: AC
  Administered 2017-07-17: 500 mL via INTRAVENOUS

## 2017-07-17 NOTE — ED Notes (Signed)
Spoke to [employer], Therapist, sports at [facility]. Informed pt. Is being transported back and reviewed discharge information.

## 2017-07-17 NOTE — ED Triage Notes (Signed)
Pt. Arrived EMS from [place] [phone] with c/o emesis with black streaks.Per EMS on arrival pt had basin where he was coughing up sputum with black streaks. Pt. States vomiting for 5 days. Staff states pt. Has been vomiting since yesterday. Given Zofran per [facility] facilty. CBG 307, A/O with jumbled and slow speech per staff this is pt. baseline, contracted on right side, wears a brief,  denies pain per EMS pt requesting something to drink.. Pt. Wants [place] to call [facility]. VS- 109/80, HR- 70's, 99% RA

## 2017-07-17 NOTE — ED Notes (Signed)
Patient denies pain and is resting comfortably.  

## 2017-07-17 NOTE — ED Notes (Signed)
Bed: GT36 Expected date:  Expected time:  Means of arrival:  Comments: EMS-cancer pt, lethargy

## 2017-07-17 NOTE — ED Notes (Signed)
Pt. Is pending transport back to [facility]. Ptar notified.

## 2017-07-17 NOTE — ED Notes (Signed)
Patient transported to X-ray 
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
## 2017-07-18 ENCOUNTER — Encounter: Payer: Self-pay | Admitting: Adult Health

## 2017-07-18 ENCOUNTER — Non-Acute Institutional Stay (SKILLED_NURSING_FACILITY): Payer: Medicare Other | Admitting: Adult Health

## 2017-07-18 DIAGNOSIS — K861 Other chronic pancreatitis: Secondary | ICD-10-CM
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
## 2017-07-30 DIAGNOSIS — Z716 Tobacco abuse counseling: Secondary | ICD-10-CM | POA: Insufficient documentation

## 2017-08-01 ENCOUNTER — Encounter: Payer: Self-pay | Admitting: Adult Health

## 2017-08-01 ENCOUNTER — Non-Acute Institutional Stay (SKILLED_NURSING_FACILITY): Payer: Medicare Other | Admitting: Adult Health

## 2017-08-01 DIAGNOSIS — M069 Rheumatoid arthritis, unspecified: Secondary | ICD-10-CM | POA: Diagnosis not present

## 2017-08-01 DIAGNOSIS — E785 Hyperlipidemia, unspecified: Secondary | ICD-10-CM | POA: Diagnosis not present

## 2017-08-01 DIAGNOSIS — Z8673 Personal history of transient ischemic attack (TIA), and cerebral infarction without residual deficits: Secondary | ICD-10-CM | POA: Diagnosis not present

## 2017-08-01 DIAGNOSIS — E1169 Type 2 diabetes mellitus with other specified complication: Secondary | ICD-10-CM

## 2017-08-01 DIAGNOSIS — R5381 Other malaise: Secondary | ICD-10-CM | POA: Diagnosis not present

## 2017-08-01 DIAGNOSIS — E43 Unspecified severe protein-calorie malnutrition: Secondary | ICD-10-CM
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
## 2017-08-02 LAB — MICROALBUMIN, URINE: MICROALB UR: 2

## 2017-08-16 ENCOUNTER — Non-Acute Institutional Stay (SKILLED_NURSING_FACILITY): Payer: Medicare Other | Admitting: Adult Health

## 2017-08-16 ENCOUNTER — Encounter: Payer: Self-pay | Admitting: Adult Health

## 2017-08-16 DIAGNOSIS — E1149 Type 2 diabetes mellitus with other diabetic neurological complication: Secondary | ICD-10-CM
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
## 2017-08-27 ENCOUNTER — Encounter: Payer: Self-pay | Admitting: Internal Medicine

## 2017-08-27 ENCOUNTER — Non-Acute Institutional Stay (SKILLED_NURSING_FACILITY): Payer: Medicare Other | Admitting: Internal Medicine

## 2017-08-27 DIAGNOSIS — K2210 Ulcer of esophagus without bleeding: Secondary | ICD-10-CM | POA: Diagnosis not present

## 2017-08-27 DIAGNOSIS — R1084 Generalized abdominal pain: Secondary | ICD-10-CM | POA: Diagnosis not present

## 2017-08-27 DIAGNOSIS — K861 Other chronic pancreatitis: Secondary | ICD-10-CM

## 2017-08-27 DIAGNOSIS — E43 Unspecified severe protein-calorie malnutrition: Secondary | ICD-10-CM

## 2017-08-27 DIAGNOSIS — R112 Nausea with vomiting, unspecified: Secondary | ICD-10-CM

## 2017-08-27 DIAGNOSIS — M62422 Contracture of muscle, left upper arm: Secondary | ICD-10-CM
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
## 2017-08-30 ENCOUNTER — Other Ambulatory Visit: Payer: Self-pay

## 2017-08-30 ENCOUNTER — Encounter: Payer: Self-pay | Admitting: Adult Health

## 2017-08-30 ENCOUNTER — Emergency Department (HOSPITAL_COMMUNITY)
Admission: EM | Admit: 2017-08-30 | Discharge: 2017-08-30 | Disposition: A | Discharge status: Home / Self Care | Payer: Medicare Other | Attending: Emergency Medicine | Admitting: Emergency Medicine

## 2017-08-30 ENCOUNTER — Non-Acute Institutional Stay (SKILLED_NURSING_FACILITY): Payer: Medicare Other | Admitting: Adult Health

## 2017-08-30 ENCOUNTER — Encounter (HOSPITAL_COMMUNITY): Payer: Self-pay

## 2017-08-30 DIAGNOSIS — R531 Weakness: Secondary | ICD-10-CM | POA: Diagnosis present

## 2017-08-30 DIAGNOSIS — Z794 Long term (current) use of insulin: Secondary | ICD-10-CM | POA: Diagnosis not present

## 2017-08-30 DIAGNOSIS — E1149 Type 2 diabetes mellitus with other diabetic neurological complication: Secondary | ICD-10-CM

## 2017-08-30 DIAGNOSIS — J449 Chronic obstructive pulmonary disease, unspecified: Secondary | ICD-10-CM | POA: Diagnosis not present

## 2017-08-30 DIAGNOSIS — F323 Major depressive disorder, single episode, severe with psychotic features: Secondary | ICD-10-CM | POA: Diagnosis not present

## 2017-08-30 DIAGNOSIS — I2510 Atherosclerotic heart disease of native coronary artery without angina pectoris: Secondary | ICD-10-CM | POA: Insufficient documentation

## 2017-08-30 DIAGNOSIS — Z79899 Other long term (current) drug therapy: Secondary | ICD-10-CM | POA: Diagnosis not present

## 2017-08-30 DIAGNOSIS — K861 Other chronic pancreatitis: Secondary | ICD-10-CM

## 2017-08-30 DIAGNOSIS — F17210 Nicotine dependence, cigarettes, uncomplicated: Secondary | ICD-10-CM | POA: Diagnosis not present

## 2017-08-30 DIAGNOSIS — I252 Old myocardial infarction: Secondary | ICD-10-CM | POA: Diagnosis not present

## 2017-08-30 DIAGNOSIS — I10 Essential (primary) hypertension: Secondary | ICD-10-CM | POA: Insufficient documentation

## 2017-08-30 DIAGNOSIS — E119 Type 2 diabetes mellitus without complications: Secondary | ICD-10-CM | POA: Diagnosis not present

## 2017-08-30 DIAGNOSIS — Z7982 Long term (current) use of aspirin: Secondary | ICD-10-CM | POA: Diagnosis not present

## 2017-08-30 NOTE — ED Triage Notes (Signed)
CBG 207 at facility prior to EMS transport
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
## 2017-08-30 NOTE — ED Notes (Signed)
Bed: [facility] Expected date:  Expected time:  Means of arrival:  Comments: EMS-dementia/aggressive

## 2017-08-30 NOTE — Discharge Instructions (Signed)
Call the patients md if any problems.
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
## 2017-08-30 NOTE — ED Triage Notes (Signed)
Patient arrives by EMS from [place]. Patient chose not to eat today-facility called SNF physician and stated to send patient to ED. Patient did not want transport, POA stated to take him anyway. Patient is combative with EMS because he did not want to come-declines any VS or care, states he just wants to go back to the facility.

## 2017-08-30 NOTE — ED Notes (Signed)
Attempted to call report to [facility] [place].

## 2017-08-31 ENCOUNTER — Encounter: Payer: Self-pay | Admitting: Adult Health

## 2017-08-31 ENCOUNTER — Non-Acute Institutional Stay (SKILLED_NURSING_FACILITY): Payer: Medicare Other | Admitting: Adult Health

## 2017-08-31 ENCOUNTER — Encounter: Payer: Self-pay | Admitting: Internal Medicine

## 2017-08-31 DIAGNOSIS — R404 Transient alteration of awareness: Secondary | ICD-10-CM | POA: Diagnosis not present
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
## 2017-09-03 LAB — HEMOGLOBIN A1C: HEMOGLOBIN A1C: 11.8

## 2017-09-03 LAB — CBC AND DIFFERENTIAL
HCT: 35 — AB (ref 41–53)
Hemoglobin: 10.4 — AB (ref 13.5–17.5)
NEUTROS ABS: 10
Platelets: 515 — AB (ref 150–399)
WBC: 13.8

## 2017-09-03 LAB — BASIC METABOLIC PANEL
BUN: 34 — AB (ref 4–21)
Creatinine: 1 (ref 0.6–1.3)
Glucose: 217
Potassium: 3.8 (ref 3.4–5.3)
Sodium: 140 (ref 137–147)

## 2017-09-03 LAB — LIPID PANEL
Cholesterol: 70 (ref 0–200)
HDL: 29 — AB (ref 35–70)
LDL CALC: 21
TRIGLYCERIDES: 97 (ref 40–160)

## 2017-09-03 LAB — HEPATIC FUNCTION PANEL
ALT: 9 — AB (ref 10–40)
AST: 16 (ref 14–40)
Alkaline Phosphatase: 104 (ref 25–125)
Bilirubin, Total: 0.5

## 2017-09-04 ENCOUNTER — Non-Acute Institutional Stay (SKILLED_NURSING_FACILITY): Payer: Medicare Other | Admitting: Adult Health

## 2017-09-04 ENCOUNTER — Encounter: Payer: Self-pay | Admitting: Adult Health

## 2017-09-04 DIAGNOSIS — D473 Essential (hemorrhagic) thrombocythemia: Secondary | ICD-10-CM

## 2017-09-04 DIAGNOSIS — I10 Essential (primary) hypertension: Secondary | ICD-10-CM | POA: Diagnosis not present

## 2017-09-04 DIAGNOSIS — D72829 Elevated white blood cell count, unspecified: Secondary | ICD-10-CM

## 2017-09-04 DIAGNOSIS — E1149 Type 2 diabetes mellitus with other diabetic neurological complication: Secondary | ICD-10-CM

## 2017-09-04 DIAGNOSIS — E1165 Type 2 diabetes mellitus with hyperglycemia: Secondary | ICD-10-CM

## 2017-09-04 DIAGNOSIS — D75839 Thrombocytosis, unspecified: Secondary | ICD-10-CM | POA: Insufficient documentation

## 2017-09-04 DIAGNOSIS — IMO0002 Reserved for concepts with insufficient information to code with codable children: Secondary | ICD-10-CM
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
## 2017-09-12 ENCOUNTER — Inpatient Hospital Stay (HOSPITAL_COMMUNITY): Payer: Medicare Other

## 2017-09-12 ENCOUNTER — Encounter: Payer: Self-pay | Admitting: Adult Health

## 2017-09-12 ENCOUNTER — Non-Acute Institutional Stay (SKILLED_NURSING_FACILITY): Payer: Medicare Other | Admitting: Adult Health

## 2017-09-12 ENCOUNTER — Emergency Department (HOSPITAL_COMMUNITY): Payer: Medicare Other

## 2017-09-12 ENCOUNTER — Inpatient Hospital Stay (HOSPITAL_COMMUNITY)
Admission: EM | Admit: 2017-09-12 | Discharge: 2017-09-24 | DRG: 177 | Disposition: A | Discharge status: Skilled Nursing Facility | Payer: Medicare Other | Attending: Internal Medicine | Admitting: Internal Medicine

## 2017-09-12 ENCOUNTER — Encounter (HOSPITAL_COMMUNITY): Payer: Self-pay | Admitting: Emergency Medicine

## 2017-09-12 DIAGNOSIS — J189 Pneumonia, unspecified organism: Secondary | ICD-10-CM | POA: Diagnosis not present

## 2017-09-12 DIAGNOSIS — IMO0002 Reserved for concepts with insufficient information to code with codable children: Secondary | ICD-10-CM

## 2017-09-12 DIAGNOSIS — N141 Nephropathy induced by other drugs, medicaments and biological substances: Secondary | ICD-10-CM | POA: Diagnosis not present

## 2017-09-12 DIAGNOSIS — R000 Tachycardia, unspecified: Secondary | ICD-10-CM

## 2017-09-12 DIAGNOSIS — Z794 Long term (current) use of insulin: Secondary | ICD-10-CM

## 2017-09-12 DIAGNOSIS — K5790 Diverticulosis of intestine, part unspecified, without perforation or abscess without bleeding: Secondary | ICD-10-CM | POA: Diagnosis present

## 2017-09-12 DIAGNOSIS — Z681 Body mass index (BMI) 19 or less, adult: Secondary | ICD-10-CM | POA: Diagnosis not present

## 2017-09-12 DIAGNOSIS — I2510 Atherosclerotic heart disease of native coronary artery without angina pectoris: Secondary | ICD-10-CM | POA: Diagnosis present

## 2017-09-12 DIAGNOSIS — R339 Retention of urine, unspecified: Secondary | ICD-10-CM | POA: Diagnosis not present

## 2017-09-12 DIAGNOSIS — C787 Secondary malignant neoplasm of liver and intrahepatic bile duct: Secondary | ICD-10-CM | POA: Diagnosis present

## 2017-09-12 DIAGNOSIS — J690 Pneumonitis due to inhalation of food and vomit: Secondary | ICD-10-CM | POA: Diagnosis present

## 2017-09-12 DIAGNOSIS — Z8673 Personal history of transient ischemic attack (TIA), and cerebral infarction without residual deficits: Secondary | ICD-10-CM

## 2017-09-12 DIAGNOSIS — Z961 Presence of intraocular lens: Secondary | ICD-10-CM | POA: Diagnosis present

## 2017-09-12 DIAGNOSIS — J90 Pleural effusion, not elsewhere classified: Secondary | ICD-10-CM

## 2017-09-12 DIAGNOSIS — K567 Ileus, unspecified: Secondary | ICD-10-CM | POA: Diagnosis not present

## 2017-09-12 DIAGNOSIS — N179 Acute kidney failure, unspecified: Secondary | ICD-10-CM

## 2017-09-12 DIAGNOSIS — M069 Rheumatoid arthritis, unspecified: Secondary | ICD-10-CM | POA: Diagnosis present

## 2017-09-12 DIAGNOSIS — R160 Hepatomegaly, not elsewhere classified: Secondary | ICD-10-CM | POA: Diagnosis not present

## 2017-09-12 DIAGNOSIS — R64 Cachexia: Secondary | ICD-10-CM | POA: Diagnosis present

## 2017-09-12 DIAGNOSIS — E876 Hypokalemia: Secondary | ICD-10-CM | POA: Diagnosis present

## 2017-09-12 DIAGNOSIS — N182 Chronic kidney disease, stage 2 (mild): Secondary | ICD-10-CM | POA: Diagnosis present

## 2017-09-12 DIAGNOSIS — E860 Dehydration: Secondary | ICD-10-CM | POA: Diagnosis present

## 2017-09-12 DIAGNOSIS — Z8701 Personal history of pneumonia (recurrent): Secondary | ICD-10-CM

## 2017-09-12 DIAGNOSIS — I82412 Acute embolism and thrombosis of left femoral vein: Secondary | ICD-10-CM | POA: Diagnosis not present

## 2017-09-12 DIAGNOSIS — Z9841 Cataract extraction status, right eye: Secondary | ICD-10-CM

## 2017-09-12 DIAGNOSIS — M2450 Contracture, unspecified joint: Secondary | ICD-10-CM | POA: Diagnosis present

## 2017-09-12 DIAGNOSIS — C786 Secondary malignant neoplasm of retroperitoneum and peritoneum: Secondary | ICD-10-CM | POA: Diagnosis present

## 2017-09-12 DIAGNOSIS — I959 Hypotension, unspecified: Secondary | ICD-10-CM | POA: Diagnosis not present

## 2017-09-12 DIAGNOSIS — K8020 Calculus of gallbladder without cholecystitis without obstruction: Secondary | ICD-10-CM

## 2017-09-12 DIAGNOSIS — R7989 Other specified abnormal findings of blood chemistry: Secondary | ICD-10-CM

## 2017-09-12 DIAGNOSIS — F17210 Nicotine dependence, cigarettes, uncomplicated: Secondary | ICD-10-CM | POA: Diagnosis present

## 2017-09-12 DIAGNOSIS — E1149 Type 2 diabetes mellitus with other diabetic neurological complication: Secondary | ICD-10-CM | POA: Diagnosis present

## 2017-09-12 DIAGNOSIS — K8010 Calculus of gallbladder with chronic cholecystitis without obstruction: Secondary | ICD-10-CM | POA: Diagnosis not present

## 2017-09-12 DIAGNOSIS — R180 Malignant ascites: Secondary | ICD-10-CM | POA: Diagnosis present

## 2017-09-12 DIAGNOSIS — R4182 Altered mental status, unspecified: Secondary | ICD-10-CM | POA: Diagnosis present

## 2017-09-12 DIAGNOSIS — I2699 Other pulmonary embolism without acute cor pulmonale: Secondary | ICD-10-CM | POA: Diagnosis not present

## 2017-09-12 DIAGNOSIS — E43 Unspecified severe protein-calorie malnutrition: Secondary | ICD-10-CM | POA: Diagnosis present

## 2017-09-12 DIAGNOSIS — R404 Transient alteration of awareness: Secondary | ICD-10-CM

## 2017-09-12 DIAGNOSIS — R569 Unspecified convulsions: Secondary | ICD-10-CM | POA: Diagnosis present

## 2017-09-12 DIAGNOSIS — T17908A Unspecified foreign body in respiratory tract, part unspecified causing other injury, initial encounter: Secondary | ICD-10-CM

## 2017-09-12 DIAGNOSIS — Z9842 Cataract extraction status, left eye: Secondary | ICD-10-CM

## 2017-09-12 DIAGNOSIS — R188 Other ascites: Secondary | ICD-10-CM | POA: Diagnosis not present

## 2017-09-12 DIAGNOSIS — D72829 Elevated white blood cell count, unspecified: Secondary | ICD-10-CM | POA: Diagnosis present

## 2017-09-12 DIAGNOSIS — I252 Old myocardial infarction: Secondary | ICD-10-CM | POA: Diagnosis not present

## 2017-09-12 DIAGNOSIS — F329 Major depressive disorder, single episode, unspecified: Secondary | ICD-10-CM | POA: Diagnosis present

## 2017-09-12 DIAGNOSIS — Z66 Do not resuscitate: Secondary | ICD-10-CM | POA: Diagnosis not present

## 2017-09-12 DIAGNOSIS — Z888 Allergy status to other drugs, medicaments and biological substances status: Secondary | ICD-10-CM

## 2017-09-12 DIAGNOSIS — K861 Other chronic pancreatitis: Secondary | ICD-10-CM | POA: Diagnosis present

## 2017-09-12 DIAGNOSIS — T508X5A Adverse effect of diagnostic agents, initial encounter: Secondary | ICD-10-CM | POA: Diagnosis not present

## 2017-09-12 DIAGNOSIS — Z8551 Personal history of malignant neoplasm of bladder: Secondary | ICD-10-CM

## 2017-09-12 DIAGNOSIS — F419 Anxiety disorder, unspecified: Secondary | ICD-10-CM | POA: Diagnosis present

## 2017-09-12 DIAGNOSIS — R12 Heartburn: Secondary | ICD-10-CM | POA: Diagnosis not present

## 2017-09-12 DIAGNOSIS — K219 Gastro-esophageal reflux disease without esophagitis: Secondary | ICD-10-CM | POA: Diagnosis present

## 2017-09-12 DIAGNOSIS — Z515 Encounter for palliative care: Secondary | ICD-10-CM | POA: Diagnosis not present

## 2017-09-12 DIAGNOSIS — J449 Chronic obstructive pulmonary disease, unspecified: Secondary | ICD-10-CM | POA: Diagnosis present

## 2017-09-12 DIAGNOSIS — L8990 Pressure ulcer of unspecified site, unspecified stage: Secondary | ICD-10-CM

## 2017-09-12 DIAGNOSIS — D75839 Thrombocytosis, unspecified: Secondary | ICD-10-CM | POA: Diagnosis present

## 2017-09-12 DIAGNOSIS — E1122 Type 2 diabetes mellitus with diabetic chronic kidney disease: Secondary | ICD-10-CM | POA: Diagnosis present

## 2017-09-12 DIAGNOSIS — Z7189 Other specified counseling: Secondary | ICD-10-CM | POA: Diagnosis not present

## 2017-09-12 DIAGNOSIS — E872 Acidosis: Secondary | ICD-10-CM | POA: Diagnosis present

## 2017-09-12 DIAGNOSIS — I10 Essential (primary) hypertension: Secondary | ICD-10-CM | POA: Diagnosis not present

## 2017-09-12 DIAGNOSIS — R627 Adult failure to thrive: Secondary | ICD-10-CM | POA: Diagnosis present

## 2017-09-12 DIAGNOSIS — I129 Hypertensive chronic kidney disease with stage 1 through stage 4 chronic kidney disease, or unspecified chronic kidney disease: Secondary | ICD-10-CM | POA: Diagnosis present

## 2017-09-12 DIAGNOSIS — Z7982 Long term (current) use of aspirin: Secondary | ICD-10-CM

## 2017-09-12 DIAGNOSIS — G309 Alzheimer's disease, unspecified: Secondary | ICD-10-CM | POA: Diagnosis present

## 2017-09-12 DIAGNOSIS — G9341 Metabolic encephalopathy: Secondary | ICD-10-CM | POA: Diagnosis present

## 2017-09-12 DIAGNOSIS — N319 Neuromuscular dysfunction of bladder, unspecified: Secondary | ICD-10-CM | POA: Diagnosis present

## 2017-09-12 DIAGNOSIS — L89891 Pressure ulcer of other site, stage 1: Secondary | ICD-10-CM | POA: Diagnosis present

## 2017-09-12 DIAGNOSIS — D473 Essential (hemorrhagic) thrombocythemia: Secondary | ICD-10-CM | POA: Diagnosis present

## 2017-09-12 DIAGNOSIS — Z8719 Personal history of other diseases of the digestive system: Secondary | ICD-10-CM

## 2017-09-12 DIAGNOSIS — F0280 Dementia in other diseases classified elsewhere without behavioral disturbance: Secondary | ICD-10-CM | POA: Diagnosis present

## 2017-09-12 DIAGNOSIS — Z993 Dependence on wheelchair: Secondary | ICD-10-CM

## 2017-09-12 LAB — BASIC METABOLIC PANEL
BUN: 66 — AB (ref 4–21)
Creatinine: 1.5 — AB (ref 0.6–1.3)
Glucose: 290
POTASSIUM: 4.1 (ref 3.4–5.3)
SODIUM: 132 — AB (ref 137–147)

## 2017-09-12 LAB — I-STAT TROPONIN, ED: TROPONIN I, POC: 0 ng/mL (ref 0.00–0.08)

## 2017-09-12 LAB — CBC WITH DIFFERENTIAL/PLATELET
BASOS PCT: 0 %
Basophils Absolute: 0 10*3/uL (ref 0.0–0.1)
EOS ABS: 0 10*3/uL (ref 0.0–0.7)
Eosinophils Relative: 0 %
HCT: 38.4 % — ABNORMAL LOW (ref 39.0–52.0)
Hemoglobin: 11.7 g/dL — ABNORMAL LOW (ref 13.0–17.0)
Lymphocytes Relative: 9 %
Lymphs Abs: 1.4 10*3/uL (ref 0.7–4.0)
MCH: 21.9 pg — ABNORMAL LOW (ref 26.0–34.0)
MCHC: 30.5 g/dL (ref 30.0–36.0)
MCV: 71.9 fL — ABNORMAL LOW (ref 78.0–100.0)
MONO ABS: 0.9 10*3/uL (ref 0.1–1.0)
Monocytes Relative: 6 %
NEUTROS ABS: 13.3 10*3/uL — AB (ref 1.7–7.7)
Neutrophils Relative %: 85 %
PLATELETS: 561 10*3/uL — AB (ref 150–400)
RBC: 5.34 MIL/uL (ref 4.22–5.81)
RDW: 19.3 % — ABNORMAL HIGH (ref 11.5–15.5)
WBC: 15.6 10*3/uL — ABNORMAL HIGH (ref 4.0–10.5)

## 2017-09-12 LAB — COMPREHENSIVE METABOLIC PANEL
ALK PHOS: 99 U/L (ref 38–126)
ALT: 11 U/L — AB (ref 17–63)
AST: 20 U/L (ref 15–41)
Albumin: 2.7 g/dL — ABNORMAL LOW (ref 3.5–5.0)
Anion gap: 13 (ref 5–15)
BILIRUBIN TOTAL: 0.5 mg/dL (ref 0.3–1.2)
BUN: 66 mg/dL — AB (ref 6–20)
CO2: 26 mmol/L (ref 22–32)
CREATININE: 1.68 mg/dL — AB (ref 0.61–1.24)
Calcium: 9.7 mg/dL (ref 8.9–10.3)
Chloride: 96 mmol/L — ABNORMAL LOW (ref 101–111)
GFR calc Af Amer: 44 mL/min — ABNORMAL LOW (ref >60)
GFR, EST NON AFRICAN AMERICAN: 38 mL/min — AB (ref >60)
GLUCOSE: 293 mg/dL — AB (ref 65–99)
Potassium: 3.7 mmol/L (ref 3.5–5.1)
SODIUM: 135 mmol/L (ref 135–145)
TOTAL PROTEIN: 7.6 g/dL (ref 6.5–8.1)

## 2017-09-12 LAB — MRSA PCR SCREENING: MRSA BY PCR: NEGATIVE

## 2017-09-12 LAB — HEPATIC FUNCTION PANEL
ALT: 5 — AB (ref 10–40)
AST: 14 (ref 14–40)
Alkaline Phosphatase: 112 (ref 25–125)
Bilirubin, Total: 0.4

## 2017-09-12 LAB — CBC AND DIFFERENTIAL
HEMATOCRIT: 33 — AB (ref 41–53)
HEMOGLOBIN: 10.1 — AB (ref 13.5–17.5)
NEUTROS ABS: 12
Platelets: 638 — AB (ref 150–399)
WBC: 14.5

## 2017-09-12 LAB — I-STAT CG4 LACTIC ACID, ED
LACTIC ACID, VENOUS: 2.59 mmol/L — AB (ref 0.5–1.9)
Lactic Acid, Venous: 2.31 mmol/L (ref 0.5–1.9)

## 2017-09-12 LAB — URINALYSIS, ROUTINE W REFLEX MICROSCOPIC
BILIRUBIN URINE: NEGATIVE
GLUCOSE, UA: NEGATIVE mg/dL
Hgb urine dipstick: NEGATIVE
KETONES UR: NEGATIVE mg/dL
Leukocytes, UA: NEGATIVE
Nitrite: NEGATIVE
PH: 5 (ref 5.0–8.0)
Protein, ur: NEGATIVE mg/dL
SPECIFIC GRAVITY, URINE: 1.015 (ref 1.005–1.030)

## 2017-09-12 LAB — GLUCOSE, CAPILLARY: GLUCOSE-CAPILLARY: 238 mg/dL — AB (ref 65–99)

## 2017-09-12 MED ORDER — METOPROLOL TARTRATE 12.5 MG HALF TABLET
12.5000 mg | ORAL_TABLET | Freq: Two times a day (BID) | ORAL | Status: DC
  Administered 2017-09-12: 12.5 mg via ORAL
  Filled 2017-09-12 (×2): qty 1

## 2017-09-12 MED ORDER — SERTRALINE HCL 50 MG PO TABS
50.0000 mg | ORAL_TABLET | Freq: Every day | ORAL | Status: DC
  Administered 2017-09-14 – 2017-09-22 (×9): 50 mg via ORAL
  Filled 2017-09-12 (×10): qty 1

## 2017-09-12 MED ORDER — ENOXAPARIN SODIUM 30 MG/0.3ML ~~LOC~~ SOLN
30.0000 mg | SUBCUTANEOUS | Status: DC
  Administered 2017-09-12: 30 mg via SUBCUTANEOUS
  Filled 2017-09-12: qty 0.3

## 2017-09-12 MED ORDER — PRO-STAT SUGAR FREE PO LIQD
30.0000 mL | Freq: Two times a day (BID) | ORAL | Status: DC
  Administered 2017-09-14: 30 mL via ORAL
  Filled 2017-09-12: qty 30

## 2017-09-12 MED ORDER — METOPROLOL TARTRATE 5 MG/5ML IV SOLN
2.5000 mg | Freq: Once | INTRAVENOUS | Status: DC
  Filled 2017-09-12: qty 5

## 2017-09-12 MED ORDER — CEFEPIME HCL 1 G IJ SOLR
1.0000 g | Freq: Two times a day (BID) | INTRAMUSCULAR | Status: DC
  Administered 2017-09-12 – 2017-09-13 (×2): 1 g via INTRAVENOUS
  Filled 2017-09-12 (×4): qty 1

## 2017-09-12 MED ORDER — INSULIN ASPART 100 UNIT/ML ~~LOC~~ SOLN
0.0000 [IU] | SUBCUTANEOUS | Status: DC
  Administered 2017-09-12: 3 [IU] via SUBCUTANEOUS
  Administered 2017-09-13 – 2017-09-14 (×3): 2 [IU] via SUBCUTANEOUS
  Administered 2017-09-14 – 2017-09-24 (×30): 1 [IU] via SUBCUTANEOUS

## 2017-09-12 MED ORDER — ASPIRIN EC 81 MG PO TBEC
81.0000 mg | DELAYED_RELEASE_TABLET | Freq: Every day | ORAL | Status: DC
  Administered 2017-09-14 – 2017-09-22 (×9): 81 mg via ORAL
  Filled 2017-09-12 (×10): qty 1

## 2017-09-12 MED ORDER — LEVETIRACETAM 500 MG PO TABS
500.0000 mg | ORAL_TABLET | Freq: Two times a day (BID) | ORAL | Status: DC
  Administered 2017-09-12: 500 mg via ORAL
  Filled 2017-09-12 (×2): qty 1

## 2017-09-12 MED ORDER — SODIUM CHLORIDE 0.9 % IV BOLUS (SEPSIS)
1000.0000 mL | Freq: Once | INTRAVENOUS | Status: AC
  Administered 2017-09-12: 1000 mL via INTRAVENOUS

## 2017-09-12 MED ORDER — VITAMIN D 1000 UNITS PO TABS
2000.0000 [IU] | ORAL_TABLET | Freq: Every day | ORAL | Status: DC
  Administered 2017-09-14 – 2017-09-22 (×9): 2000 [IU] via ORAL
  Filled 2017-09-12 (×10): qty 2

## 2017-09-12 MED ORDER — ARIPIPRAZOLE 2 MG PO TABS
2.0000 mg | ORAL_TABLET | Freq: Every day | ORAL | Status: DC
  Administered 2017-09-14 – 2017-09-22 (×10): 2 mg via ORAL
  Filled 2017-09-12 (×12): qty 1

## 2017-09-12 MED ORDER — ATORVASTATIN CALCIUM 40 MG PO TABS
80.0000 mg | ORAL_TABLET | Freq: Every day | ORAL | Status: DC
  Administered 2017-09-14 – 2017-09-22 (×8): 80 mg via ORAL
  Filled 2017-09-12 (×8): qty 2

## 2017-09-12 MED ORDER — SODIUM CHLORIDE 0.9 % IV SOLN
INTRAVENOUS | Status: AC
  Administered 2017-09-12: 22:00:00 via INTRAVENOUS

## 2017-09-12 MED ORDER — BISACODYL 10 MG RE SUPP: 10.0000 mg | RECTAL | Status: DC | PRN

## 2017-09-12 MED ORDER — VANCOMYCIN HCL 500 MG IV SOLR
500.0000 mg | INTRAVENOUS | Status: DC
  Filled 2017-09-12: qty 500

## 2017-09-12 MED ORDER — VANCOMYCIN HCL IN DEXTROSE 1-5 GM/200ML-% IV SOLN
1000.0000 mg | Freq: Once | INTRAVENOUS | Status: AC
  Administered 2017-09-12: 1000 mg via INTRAVENOUS
  Filled 2017-09-12: qty 200

## 2017-09-12 MED ORDER — ONDANSETRON HCL 4 MG PO TABS
4.0000 mg | ORAL_TABLET | Freq: Four times a day (QID) | ORAL | Status: DC | PRN
  Administered 2017-09-14: 4 mg via ORAL
  Filled 2017-09-12 (×2): qty 1

## 2017-09-12 MED ORDER — DEXTROSE 5 % IV SOLN
1.0000 g | Freq: Once | INTRAVENOUS | Status: AC
  Administered 2017-09-12: 1 g via INTRAVENOUS
  Filled 2017-09-12: qty 10

## 2017-09-12 MED ORDER — FOLIC ACID 1 MG PO TABS
1.0000 mg | ORAL_TABLET | Freq: Every day | ORAL | Status: DC
  Administered 2017-09-14 – 2017-09-22 (×9): 1 mg via ORAL
  Filled 2017-09-12 (×10): qty 1

## 2017-09-12 MED ORDER — MAGNESIUM OXIDE 400 (241.3 MG) MG PO TABS
400.0000 mg | ORAL_TABLET | Freq: Two times a day (BID) | ORAL | Status: DC
  Administered 2017-09-12: 400 mg via ORAL
  Filled 2017-09-12 (×2): qty 1

## 2017-09-12 MED ORDER — DEXTROSE 5 % IV SOLN
500.0000 mg | Freq: Once | INTRAVENOUS | Status: AC
  Administered 2017-09-12: 500 mg via INTRAVENOUS
  Filled 2017-09-12: qty 500

## 2017-09-12 MED ORDER — PANTOPRAZOLE SODIUM 40 MG PO PACK
40.0000 mg | PACK | Freq: Two times a day (BID) | ORAL | Status: DC
  Filled 2017-09-12 (×2): qty 20

## 2017-09-12 MED ORDER — PANCRELIPASE (LIP-PROT-AMYL) 12000-38000 UNITS PO CPEP
12000.0000 [IU] | ORAL_CAPSULE | Freq: Three times a day (TID) | ORAL | Status: DC
  Administered 2017-09-14 – 2017-09-22 (×11): 12000 [IU] via ORAL
  Filled 2017-09-12 (×14): qty 1

## 2017-09-12 NOTE — ED Notes (Signed)
ED Provider at bedside. 

## 2017-09-12 NOTE — Progress Notes (Signed)
Pharmacy Antibiotic Note  [name]. is a 77 y.o. male admitted on 09/12/2017 with pneumonia.  Pharmacy has been consulted for vancomycin dosing.  Cefepime ordered per physician.    Today, 09/12/2017  Renal: AKI noted  WBC increased  Plan:  Vancomycin 1gm IV x 1 then 500mg  IV q24h  Cefepime 1gm IV q12h - adjusted from q8h for renal function  IF SCr worse in am then will need to be changed to q24h  Daily SCr for AKI  Check vancomycin levels if remains on vancomycin > 4 days  Suggest check MRSA PCR   Temp (24hrs), Avg:98.4 F (36.9 C), Min:98.3 F (36.8 C), Max:98.4 F (36.9 C)  Recent Labs  Lab 09/12/17 09/12/17 1631 09/12/17 1651 09/12/17 2046  WBC 14.5 15.6*  --   --   CREATININE 1.5* 1.68*  --   --   LATICACIDVEN  --   --  2.59* 2.31*    Estimated Creatinine Clearance: 29.6 mL/min (A) (by C-G formula based on SCr of 1.68 mg/dL (H)).    Allergies  Allergen Reactions  . Dilantin [Phenytoin Sodium Extended] Other (See Comments)    Developed Complete Heart Block following 1 dose    Antimicrobials this admission: 12/12 vancomycin >> 12/12 cefepime >>:  Dose adjustments this admission:  Microbiology results: 12/12 BCx:  12/12 UCx:   12/12 strep Ag 12/12 Legionella Ag 12/12 resp virus panel:  12/12 MRSA PCR:   Thank you for allowing pharmacy to be a part of this patient's care.  Doreene Eland, PharmD, BCPS.   Pager: [phone] 09/12/2017 9:35 PM

## 2017-09-12 NOTE — ED Triage Notes (Addendum)
Per PTAR, patient coming from [facility] SNF, staff reports patient has been altered with N/V since this morning. At baseline patient is alert and oriented.  BP 110/80 HR 80 RR 17 O2 95% CBG 299
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
## 2017-09-12 NOTE — ED Notes (Signed)
First set of cultures sent to lab. 

## 2017-09-12 NOTE — ED Notes (Signed)
XR at bedside

## 2017-09-12 NOTE — ED Notes (Signed)
Unsuccessful IV attempt x2. Charge RN asked to attempt.  
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
## 2017-09-12 NOTE — ED Notes (Signed)
Hospitalist at bedside 
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
## 2017-09-12 NOTE — ED Notes (Signed)
Bed: RESB Expected date:  Expected time:  Means of arrival:  Comments: EMS/< L.O.C.

## 2017-09-12 NOTE — ED Notes (Signed)
Patient transported to CT 

## 2017-09-13 ENCOUNTER — Inpatient Hospital Stay (HOSPITAL_COMMUNITY): Payer: Medicare Other

## 2017-09-13 DIAGNOSIS — I10 Essential (primary) hypertension: Secondary | ICD-10-CM

## 2017-09-13 LAB — RESPIRATORY PANEL BY PCR
Adenovirus: NOT DETECTED
Bordetella pertussis: NOT DETECTED
CHLAMYDOPHILA PNEUMONIAE-RVPPCR: NOT DETECTED
CORONAVIRUS NL63-RVPPCR: NOT DETECTED
CORONAVIRUS OC43-RVPPCR: NOT DETECTED
Coronavirus 229E: NOT DETECTED
Coronavirus HKU1: NOT DETECTED
INFLUENZA A-RVPPCR: NOT DETECTED
INFLUENZA B-RVPPCR: NOT DETECTED
MYCOPLASMA PNEUMONIAE-RVPPCR: NOT DETECTED
Metapneumovirus: NOT DETECTED
PARAINFLUENZA VIRUS 3-RVPPCR: NOT DETECTED
PARAINFLUENZA VIRUS 4-RVPPCR: NOT DETECTED
Parainfluenza Virus 1: NOT DETECTED
Parainfluenza Virus 2: NOT DETECTED
RESPIRATORY SYNCYTIAL VIRUS-RVPPCR: NOT DETECTED
RHINOVIRUS / ENTEROVIRUS - RVPPCR: NOT DETECTED

## 2017-09-13 LAB — ECHOCARDIOGRAM COMPLETE
Height: 69 in
WEIGHTICAEL: 2004.8 [oz_av]

## 2017-09-13 LAB — TROPONIN I
Troponin I: <0.03 ng/mL (ref <0.03)
Troponin I: <0.03 ng/mL (ref <0.03)
Troponin I: <0.03 ng/mL (ref <0.03)

## 2017-09-13 LAB — COMPREHENSIVE METABOLIC PANEL
ALBUMIN: 2.2 g/dL — AB (ref 3.5–5.0)
ALK PHOS: 82 U/L (ref 38–126)
ALT: 9 U/L — AB (ref 17–63)
AST: 20 U/L (ref 15–41)
Anion gap: 9 (ref 5–15)
BILIRUBIN TOTAL: 0.5 mg/dL (ref 0.3–1.2)
BUN: 54 mg/dL — AB (ref 6–20)
CO2: 25 mmol/L (ref 22–32)
CREATININE: 1.32 mg/dL — AB (ref 0.61–1.24)
Calcium: 8.9 mg/dL (ref 8.9–10.3)
Chloride: 103 mmol/L (ref 101–111)
GFR calc Af Amer: 58 mL/min — ABNORMAL LOW (ref >60)
GFR, EST NON AFRICAN AMERICAN: 50 mL/min — AB (ref >60)
GLUCOSE: 95 mg/dL (ref 65–99)
Potassium: 3.1 mmol/L — ABNORMAL LOW (ref 3.5–5.1)
Sodium: 137 mmol/L (ref 135–145)
TOTAL PROTEIN: 6.1 g/dL — AB (ref 6.5–8.1)

## 2017-09-13 LAB — GLUCOSE, CAPILLARY
GLUCOSE-CAPILLARY: 94 mg/dL (ref 65–99)
GLUCOSE-CAPILLARY: 103 mg/dL — AB (ref 65–99)
GLUCOSE-CAPILLARY: 136 mg/dL — AB (ref 65–99)
GLUCOSE-CAPILLARY: 128 mg/dL — AB (ref 65–99)
Glucose-Capillary: 158 mg/dL — ABNORMAL HIGH (ref 65–99)
Glucose-Capillary: 192 mg/dL — ABNORMAL HIGH (ref 65–99)

## 2017-09-13 LAB — D-DIMER, QUANTITATIVE (NOT AT ARMC): D DIMER QUANT: 6.91 ug{FEU}/mL — AB (ref 0.00–0.50)

## 2017-09-13 LAB — HEMOGLOBIN A1C
Hgb A1c MFr Bld: 10.5 % — ABNORMAL HIGH (ref 4.8–5.6)
Mean Plasma Glucose: 254.65 mg/dL

## 2017-09-13 LAB — STREP PNEUMONIAE URINARY ANTIGEN: Strep Pneumo Urinary Antigen: NEGATIVE

## 2017-09-13 LAB — HIV ANTIBODY (ROUTINE TESTING W REFLEX): HIV Screen 4th Generation wRfx: NONREACTIVE

## 2017-09-13 MED ORDER — SENNOSIDES-DOCUSATE SODIUM 8.6-50 MG PO TABS
1.0000 | ORAL_TABLET | Freq: Two times a day (BID) | ORAL | Status: DC
  Administered 2017-09-14 – 2017-09-22 (×15): 1 via ORAL
  Filled 2017-09-13 (×15): qty 1

## 2017-09-13 MED ORDER — SODIUM CHLORIDE 0.9 % IV SOLN
500.0000 mg | Freq: Two times a day (BID) | INTRAVENOUS | Status: DC
  Administered 2017-09-13 – 2017-09-15 (×5): 500 mg via INTRAVENOUS
  Filled 2017-09-13 (×6): qty 5

## 2017-09-13 MED ORDER — BISACODYL 10 MG RE SUPP
10.0000 mg | Freq: Every day | RECTAL | Status: AC
  Administered 2017-09-13 – 2017-09-14 (×2): 10 mg via RECTAL

## 2017-09-13 MED ORDER — SODIUM CHLORIDE 0.9 % IV SOLN
1.5000 g | Freq: Three times a day (TID) | INTRAVENOUS | Status: DC
  Administered 2017-09-13 – 2017-09-15 (×5): 1.5 g via INTRAVENOUS
  Filled 2017-09-13 (×8): qty 1.5

## 2017-09-13 MED ORDER — HEPARIN BOLUS VIA INFUSION
2500.0000 [IU] | Freq: Once | INTRAVENOUS | Status: AC
  Administered 2017-09-13: 2500 [IU] via INTRAVENOUS
  Filled 2017-09-13: qty 2500

## 2017-09-13 MED ORDER — HEPARIN (PORCINE) IN NACL 100-0.45 UNIT/ML-% IJ SOLN
1000.0000 [IU]/h | INTRAMUSCULAR | Status: DC
  Administered 2017-09-13: 1000 [IU]/h via INTRAVENOUS
  Filled 2017-09-13: qty 250

## 2017-09-13 MED ORDER — MAGNESIUM SULFATE 2 GM/50ML IV SOLN
2.0000 g | Freq: Once | INTRAVENOUS | Status: AC
  Administered 2017-09-13: 2 g via INTRAVENOUS
  Filled 2017-09-13: qty 50

## 2017-09-13 MED ORDER — POLYETHYLENE GLYCOL 3350 17 G PO PACK
17.0000 g | PACK | Freq: Every day | ORAL | Status: DC
  Administered 2017-09-14 – 2017-09-19 (×5): 17 g via ORAL
  Filled 2017-09-13 (×6): qty 1

## 2017-09-13 MED ORDER — METOPROLOL TARTRATE 5 MG/5ML IV SOLN
2.5000 mg | Freq: Four times a day (QID) | INTRAVENOUS | Status: DC
  Administered 2017-09-13 – 2017-09-14 (×5): 2.5 mg via INTRAVENOUS
  Filled 2017-09-13 (×8): qty 5

## 2017-09-13 MED ORDER — POTASSIUM CHLORIDE 10 MEQ/100ML IV SOLN
10.0000 meq | INTRAVENOUS | Status: AC
  Administered 2017-09-13 (×6): 10 meq via INTRAVENOUS
  Filled 2017-09-13 (×6): qty 100

## 2017-09-13 MED ORDER — MINERAL OIL RE ENEM
1.0000 | ENEMA | Freq: Once | RECTAL | Status: AC
  Administered 2017-09-13: 1 via RECTAL
  Filled 2017-09-13: qty 1

## 2017-09-13 MED ORDER — PANTOPRAZOLE SODIUM 40 MG IV SOLR
40.0000 mg | Freq: Two times a day (BID) | INTRAVENOUS | Status: DC
  Administered 2017-09-13 – 2017-09-16 (×6): 40 mg via INTRAVENOUS
  Filled 2017-09-13 (×7): qty 40
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
## 2017-09-13 NOTE — Progress Notes (Signed)
Bladder scanned pt with 539ml of urine located

## 2017-09-13 NOTE — Progress Notes (Signed)
  Echocardiogram 2D Echocardiogram has been performed.  Darlina Sicilian M 09/13/2017, 3:43 PM

## 2017-09-13 NOTE — Progress Notes (Signed)
PROGRESS NOTE  [name]. BWG:[id number] DOB: 1940/12/03 DOA: 09/12/2017 PCP: [name], DO  HPI/Recap of past 24 hours:  Report no bm for several days,  No cough, on room air Denies ab pain, no n/v. condon catheter inplace with clear urine  Assessment/Plan: Principal Problem:   HCAP (healthcare-associated pneumonia) Active Problems:   Altered mental status   AKI (acute kidney injury) ([place])   Type II diabetes mellitus with neurological manifestations ([place])   Thrombocytosis ([place])   Leukocytosis   Tachycardia   Patient is sent from SNF TO ED due to altered mental status/dehydration Per chart review patient has ongoing active issue since October with decrease oral intake, intermittent n/v,  weight loss. Lipase and lft wnl. Initial x ray concerns for aspiration pneumonia and possible ileus. He is started on abx/ivf and admitted to the hospital  N/V? Intermittent since October No n/v here in the hospital. No abdominal pain. He does has constipation on x ray.  Enema  Gallstone on x ray, lft wnl/ lipase wnl. No abdominal pain. Will get abdomina US.  H/o erosive esophagitis: continue ppi  Aspiration pneumonia? With tachycardia, leukocytosis and lactic acidosis on presentation He was started on vanc/cefepime on admission. mrsa screen negative. D/c vanc, change cefepime to unasyn. Swallow eval, continue ivf.  Sinus tachycardia: iv lopressor, ddimer elevated, pretest probability is high for DVT/PE due to baseline immobile.  echo/venous doppler lower extremity pending,  empirically start on heparin drip for presumed PE, consider CTA once cr improves, check tsh. cxr with possible pneumonia, V/Q scan may be suboptimal.   AKI onc ckd II: ua no infection, bladder scan with 543, foley placed, check psa, but likely neurogenic bladder /constipation Treat constipation, trial of flomax once able to take oral meds Continue hydration Repeat bmp in am, renal dosing  meds  Hypokalemia; replace k, check mag   HTN: now borderline bp, hold home meds norvasc, on iv lopressor with holding parameters. Continue hydration.  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   H/o CVA/seizure/dementia/dysphagia Change to iv keppra, aspiration precaution.   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 Daughter reports patient was discharged to [facility] after hospitalization in 05/2017, then he was sent to [facility], patient now is a long term resident at [facility] for 1-2 months due to it was determined that he was not able to participate rehab. Daughter has not seem patient for a few months but reports patient has been progressive decline and recent significant weight loss.  Per chart review, patient has been sent to ED multiple times recently due to not eating and dehydration.  Daughter reports patient declined feeding tube placement in the past. Daughter reports patient is a full code. I have discussed with daughter that the progressive decline likely will be ongoing and less likely reversible. I have asked if they want to meet with palliative care, daughter states she will discuss with the rest of the family. Patient is full code for now.    Very poor prognosis.   Code Status: full  Family Communication: daughter  over the phone  Disposition Plan: pending   Consultants:  none  Procedures:  none  Antibiotics:  Vanc/cefepime from admission to 12/13  unasyn from  12/13   Objective: BP 101/64 (BP Location: Right Arm)   Pulse (!) 138   Temp (!) 97.5 F (36.4 C) (Axillary)   Resp 19   SpO2 99%   Intake/Output Summary (Last 24 hours) at 09/13/2017 1341 Last data filed at 09/13/2017 0657 Gross per 24 hour  Intake 656.25 ml  Output 225 ml  Net 431.25 ml   There were no vitals filed for this visit.  Exam: Patient is examined daily including today on 09/13/2017, exams remain the same as of yesterday except that has changed    General:  Thin, frail, cachectic ,dehydrated,not oriented to time, know he is in the hospital, follow commands, dysarthria ( likely baseline), bilateral upper extremity contracture, left worse than right  Cardiovascular: sinus tachcycarida  Respiratory: CTABL  Abdomen: Soft/ND/NT, positive BS  Musculoskeletal: No Edema  Neuro: alert, oriented to place and person, chronic upper extremity contractures  Data Reviewed: Basic Metabolic Panel: Recent Labs  Lab 09/12/17 09/12/17 1631 09/13/17 0410  NA 132* 135 137  K 4.1 3.7 3.1*  CL  --  96* 103  CO2  --  26 25  GLUCOSE  --  293* 95  BUN 66* 66* 54*  CREATININE 1.5* 1.68* 1.32*  CALCIUM  --  9.7 8.9   Liver Function Tests: Recent Labs  Lab 09/12/17 09/12/17 1631 09/13/17 0410  AST 14 20 20   ALT 5* 11* 9*  ALKPHOS 112 99 82  BILITOT  --  0.5 0.5  PROT  --  7.6 6.1*  ALBUMIN  --  2.7* 2.2*   No results for input(s): LIPASE, AMYLASE in the last 168 hours. No results for input(s): AMMONIA in the last 168 hours. CBC: Recent Labs  Lab 09/12/17 09/12/17 1631  WBC 14.5 15.6*  NEUTROABS 12 13.3*  HGB 10.1* 11.7*  HCT 33* 38.4*  MCV  --  71.9*  PLT 638* 561*   Cardiac Enzymes:   Recent Labs  Lab 09/13/17 0010 09/13/17 0410 09/13/17 0636  TROPONINI <0.03 <0.03 <0.03   BNP (last 3 results) No results for input(s): BNP in the last 8760 hours.  ProBNP (last 3 results) No results for input(s): PROBNP in the last 8760 hours.  CBG: Recent Labs   Lab 09/12/17 2215 09/13/17 0008 09/13/17 0415 09/13/17 0748 09/13/17 1136  GLUCAP 238* 192* 94 103* 136*    Recent Results (from the past 240 hour(s))  Respiratory Panel by PCR     Status: None   Collection Time: 09/12/17  9:47 PM  Result Value Ref Range Status   Adenovirus NOT DETECTED NOT DETECTED Final   Coronavirus 229E NOT DETECTED NOT DETECTED Final   Coronavirus HKU1 NOT DETECTED NOT DETECTED Final   Coronavirus NL63 NOT DETECTED NOT DETECTED Final   Coronavirus OC43 NOT DETECTED NOT DETECTED Final   Metapneumovirus NOT DETECTED NOT DETECTED Final   Rhinovirus / Enterovirus NOT DETECTED NOT DETECTED Final   Influenza A NOT DETECTED NOT DETECTED Final   Influenza B NOT DETECTED NOT DETECTED Final   Parainfluenza Virus 1 NOT DETECTED NOT DETECTED Final   Parainfluenza Virus 2 NOT DETECTED NOT DETECTED Final   Parainfluenza Virus 3 NOT DETECTED NOT DETECTED Final   Parainfluenza Virus 4 NOT DETECTED NOT DETECTED Final   Respiratory Syncytial Virus NOT DETECTED NOT DETECTED Final   Bordetella pertussis NOT DETECTED  NOT DETECTED Final   Chlamydophila pneumoniae NOT DETECTED NOT DETECTED Final   Mycoplasma pneumoniae NOT DETECTED NOT DETECTED Final    Comment: Performed at [facility], [place] [address], [place], [place] [address]  MRSA PCR Screening     Status: None   Collection Time: 09/12/17  9:47 PM  Result Value Ref Range Status   MRSA by PCR NEGATIVE NEGATIVE Final    Comment:        The GeneXpert MRSA Assay (FDA approved for NASAL specimens only), is one component of a comprehensive MRSA colonization surveillance program. It is not intended to diagnose MRSA infection nor to guide or monitor treatment for MRSA infections.      Studies: Ct Head Wo Contrast  Result Date: 09/12/2017 CLINICAL DATA:  Altered level of consciousness in this 76 year old male. EXAM: CT HEAD WITHOUT CONTRAST TECHNIQUE: Contiguous axial images were obtained from the base of the  skull through the vertex without intravenous contrast. COMPARISON:  04/25/2017 FINDINGS: Brain: Encephalomalacia involving the right PCA distribution similar to prior involving the right occipital lobe and thalamus. Chronic stable atrophy with microvascular ischemia involving the white matter. No acute intracranial hemorrhage, midline shift or edema. No significant cerebellar atrophy. Midline fourth ventricle and basal cisterns without effacement. Vascular: No hyperdense vessel or unexpected calcification. Skull: Negative for fracture or focal lesion. Sinuses/Orbits: Chronic appearing left frontal sinus mucosal thickening with minimal ethmoid sinus mucosal thickening. Intact orbits and globes with bilateral cataract extractions. Other: None IMPRESSION: 1. Chronic stable atrophy, right PCA distribution chronic infarct with associated encephalomalacia and chronic microvascular ischemic disease of white matter. 2. No acute intracranial abnormality noted. 3. Chronic left frontal sinus mucosal thickening. Electronically Signed   By: Ashley Royalty M.D.   On: 09/12/2017 20:08   Dg Chest Port 1 View  Result Date: 09/12/2017 CLINICAL DATA:  Cough.  Possible aspiration pneumonia. EXAM: PORTABLE CHEST 1 VIEW COMPARISON:  07/17/2017 FINDINGS: Mildly enlarged cardiac silhouette. Stable right peritracheal stripe thickening, thought to represent vascular markings. There is no evidence of pneumothorax. Right lower lobe atelectasis versus airspace consolidation. Left lower lobe airspace consolidation. Osseous structures are without acute abnormality. Soft tissues are grossly normal. IMPRESSION: Bilateral lower lobe peribronchial airspace consolidation with superimposed mild atelectatic changes in the right lung base. Aspiration pneumonia is certainly a possibility with this appearance. Electronically Signed   By: Fidela Salisbury M.D.   On: 09/12/2017 17:54   Dg Abd 2 Views  Result Date: 09/13/2017 CLINICAL DATA:   Follow-up ileus EXAM: ABDOMEN - 2 VIEW COMPARISON:  Abdomen films of 07/17/2017 FINDINGS: Both large and small bowel gas is present without evidence of bowel obstruction. This appearance could be due to aerophagia or mild ileus. Multiple calcified gallstones again are noted within the right upper quadrant. Also there are calcifications overlying the epigastrium consistent with chronic calcific pancreatitis. Degenerative disc disease at L5-S1 is present. IMPRESSION: 1. Both large and small bowel gas is present without significant distension, possibly due to aerophagia or very minimal ileus. 2. Multiple gallstones. 3. Changes of chronic calcific pancreatitis. Electronically Signed   By: Ivar Drape M.D.   On: 09/13/2017 10:09    Scheduled Meds: . ARIPiprazole  2 mg Oral Daily  . aspirin EC  81 mg Oral Daily  . atorvastatin  80 mg Oral q1800  . bisacodyl  10 mg Rectal Daily  . cholecalciferol  2,000 Units Oral Daily  . enoxaparin (LOVENOX) injection  30 mg Subcutaneous Q24H  . feeding supplement (PRO-STAT  SUGAR FREE 64)  30 mL Oral BID  . folic acid  1 mg Oral Daily  . insulin aspart  0-9 Units Subcutaneous Q4H  . lipase/protease/amylase  12,000 Units Oral TID WC  . magnesium oxide  400 mg Oral BID  . metoprolol tartrate  2.5 mg Intravenous Q6H  . mineral oil  1 enema Rectal Once  . pantoprazole sodium  40 mg Oral BID  . sertraline  50 mg Oral Daily    Continuous Infusions: . sodium chloride 75 mL/hr at 09/12/17 2219  . ceFEPime (MAXIPIME) IV Stopped (09/13/17 1004)  . levETIRAcetam    . potassium chloride    . vancomycin       Time spent: 51mins I have personally reviewed and interpreted on  09/13/2017 daily labs, tele strips, imagings as discussed above under date review session and assessment and plans.  I reviewed all nursing notes, pharmacy notes,  vitals, pertinent old records  I have discussed plan of care as described above with RN , patient and family on 09/13/2017   Florencia Reasons MD, PhD  [facility] Pager [phone]. If 7PM-7AM, please contact night-coverage at [email or web address], password [facility] 09/13/2017, 1:41 PM  LOS: 1 day

## 2017-09-13 NOTE — Progress Notes (Signed)
Pt refused foley

## 2017-09-13 NOTE — Care Management Note (Signed)
Case Management Note  Patient Details  Name: [name]. MRN: [id number] Date of Birth: 05/27/1941  Subjective/Objective:                  [facility] Action/Plan: Date: September 13, 2017 Velva Harman, BSN, [place], [place] Chart and notes review for patient progress and needs. Will follow for case management and discharge needs. Next review date: [id number]  Expected Discharge Date:  (unknown)               Expected Discharge Plan:  Home/Self Care  In-House Referral:     Discharge planning Services  CM Consult  Post Acute Care Choice:    Choice offered to:     DME Arranged:    DME Agency:     HH Arranged:    HH Agency:     Status of Service:  In process, will continue to follow  If discussed at Long Length of Stay Meetings, dates discussed:    Additional Comments:  Leeroy Cha, RN 09/13/2017, 8:49 AM
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
## 2017-09-13 NOTE — Progress Notes (Addendum)
This writer attempted to place 21F foley. Upon inserting foley, a large blood clot enter the tubing and patient had urine return, then the patient began to urine around the foley cathether, blood was also noted coming out of the meatus of penis. Due to patient complaining of severe pain from foley insertion, foley was removed. Urology Charge RN called to see if coude foley can be placed. Charge RN busy at moment and will call back. Patient in no distress at the moment. Will continue to monitor.    Bladder scanned patient with <200 mL. Provider on call notified about bleeding from meatus and pt on heparin drip. Will continue to monitor for retention and In and out cath if needed per NP on call.

## 2017-09-14 ENCOUNTER — Inpatient Hospital Stay (HOSPITAL_COMMUNITY): Payer: Medicare Other

## 2017-09-14 DIAGNOSIS — R000 Tachycardia, unspecified: Secondary | ICD-10-CM

## 2017-09-14 LAB — GLUCOSE, CAPILLARY
GLUCOSE-CAPILLARY: 147 mg/dL — AB (ref 65–99)
GLUCOSE-CAPILLARY: 80 mg/dL (ref 65–99)
GLUCOSE-CAPILLARY: 88 mg/dL (ref 65–99)
Glucose-Capillary: 101 mg/dL — ABNORMAL HIGH (ref 65–99)
Glucose-Capillary: 127 mg/dL — ABNORMAL HIGH (ref 65–99)
Glucose-Capillary: 196 mg/dL — ABNORMAL HIGH (ref 65–99)

## 2017-09-14 LAB — TSH: TSH: 2.907 u[IU]/mL (ref 0.350–4.500)

## 2017-09-14 LAB — CBC WITH DIFFERENTIAL/PLATELET
BASOS ABS: 0 10*3/uL (ref 0.0–0.1)
Basophils Relative: 0 %
EOS PCT: 1 %
Eosinophils Absolute: 0.2 10*3/uL (ref 0.0–0.7)
HEMATOCRIT: 30.5 % — AB (ref 39.0–52.0)
HEMOGLOBIN: 9.1 g/dL — AB (ref 13.0–17.0)
LYMPHS ABS: 2 10*3/uL (ref 0.7–4.0)
LYMPHS PCT: 13 %
MCH: 21.6 pg — ABNORMAL LOW (ref 26.0–34.0)
MCHC: 29.8 g/dL — ABNORMAL LOW (ref 30.0–36.0)
MCV: 72.3 fL — AB (ref 78.0–100.0)
MONOS PCT: 7 %
Monocytes Absolute: 1.1 10*3/uL — ABNORMAL HIGH (ref 0.1–1.0)
Neutro Abs: 12.4 10*3/uL — ABNORMAL HIGH (ref 1.7–7.7)
Neutrophils Relative %: 79 %
Platelets: 511 10*3/uL — ABNORMAL HIGH (ref 150–400)
RBC: 4.22 MIL/uL (ref 4.22–5.81)
RDW: 19.7 % — AB (ref 11.5–15.5)
WBC: 15.7 10*3/uL — AB (ref 4.0–10.5)

## 2017-09-14 LAB — BASIC METABOLIC PANEL
ANION GAP: 9 (ref 5–15)
BUN: 47 mg/dL — ABNORMAL HIGH (ref 6–20)
CHLORIDE: 106 mmol/L (ref 101–111)
CO2: 21 mmol/L — AB (ref 22–32)
Calcium: 8.8 mg/dL — ABNORMAL LOW (ref 8.9–10.3)
Creatinine, Ser: 1.36 mg/dL — ABNORMAL HIGH (ref 0.61–1.24)
GFR calc Af Amer: 56 mL/min — ABNORMAL LOW (ref >60)
GFR calc non Af Amer: 49 mL/min — ABNORMAL LOW (ref >60)
GLUCOSE: 125 mg/dL — AB (ref 65–99)
POTASSIUM: 4.1 mmol/L (ref 3.5–5.1)
Sodium: 136 mmol/L (ref 135–145)

## 2017-09-14 LAB — URINE CULTURE: Culture: NO GROWTH

## 2017-09-14 LAB — LACTIC ACID, PLASMA: LACTIC ACID, VENOUS: 1.1 mmol/L (ref 0.5–1.9)

## 2017-09-14 LAB — MAGNESIUM: Magnesium: 2.5 mg/dL — ABNORMAL HIGH (ref 1.7–2.4)

## 2017-09-14 LAB — HEPARIN LEVEL (UNFRACTIONATED)
HEPARIN UNFRACTIONATED: 0.93 [IU]/mL — AB (ref 0.30–0.70)
Heparin Unfractionated: 0.18 IU/mL — ABNORMAL LOW (ref 0.30–0.70)

## 2017-09-14 MED ORDER — BOOST / RESOURCE BREEZE PO LIQD CUSTOM
1.0000 | Freq: Two times a day (BID) | ORAL | Status: DC
  Administered 2017-09-14 – 2017-09-16 (×3): 1 via ORAL

## 2017-09-14 MED ORDER — HEPARIN (PORCINE) IN NACL 100-0.45 UNIT/ML-% IJ SOLN
950.0000 [IU]/h | INTRAMUSCULAR | Status: DC
  Administered 2017-09-14: 950 [IU]/h via INTRAVENOUS
  Filled 2017-09-14 (×2): qty 250

## 2017-09-14 MED ORDER — HEPARIN (PORCINE) IN NACL 100-0.45 UNIT/ML-% IJ SOLN
900.0000 [IU]/h | INTRAMUSCULAR | Status: DC
  Filled 2017-09-14 (×2): qty 250

## 2017-09-14 MED ORDER — ALUM & MAG HYDROXIDE-SIMETH 200-200-20 MG/5ML PO SUSP
15.0000 mL | ORAL | Status: DC | PRN
  Administered 2017-09-14: 15 mL via ORAL
  Filled 2017-09-14: qty 30

## 2017-09-14 MED ORDER — TAMSULOSIN HCL 0.4 MG PO CAPS
0.4000 mg | ORAL_CAPSULE | Freq: Every day | ORAL | Status: DC
  Administered 2017-09-14 – 2017-09-22 (×9): 0.4 mg via ORAL
  Filled 2017-09-14 (×9): qty 1

## 2017-09-14 MED ORDER — SODIUM CHLORIDE 0.9 % IV SOLN: INTRAVENOUS | Status: AC

## 2017-09-14 MED ORDER — METOPROLOL TARTRATE 5 MG/5ML IV SOLN
5.0000 mg | Freq: Four times a day (QID) | INTRAVENOUS | Status: DC
  Administered 2017-09-14 – 2017-09-15 (×4): 5 mg via INTRAVENOUS
  Filled 2017-09-14 (×4): qty 5

## 2017-09-14 MED ORDER — HYDROCODONE-ACETAMINOPHEN 5-325 MG PO TABS
2.0000 | ORAL_TABLET | Freq: Once | ORAL | Status: AC
  Administered 2017-09-14: 2 via ORAL
  Filled 2017-09-14: qty 2

## 2017-09-14 MED ORDER — SODIUM CHLORIDE 0.9 % IV BOLUS (SEPSIS)
1000.0000 mL | Freq: Once | INTRAVENOUS | Status: AC
Start: 2017-09-14 — End: 2017-09-14
  Administered 2017-09-14: 1000 mL via INTRAVENOUS

## 2017-09-14 NOTE — Progress Notes (Addendum)
Patient 's HR = 138 - 140 as center telemetry notified. Notified MD at the same time. EKG done and placed in chart.

## 2017-09-14 NOTE — Progress Notes (Signed)
PROGRESS NOTE  [name] KPT:[id number] DOB: 11-Apr-1941 DOA: 09/12/2017 PCP: [name], DO  HPI/Recap of past 24 hours:  Urine retention of more than 500cc, RN not able to place foley, meeting resistance and has blood clot and hematuria, hematuria resolved this am No cough, on room air Denies ab pain, no n/v.   Assessment/Plan: Principal Problem:   HCAP (healthcare-associated pneumonia) Active Problems:   Altered mental status   AKI (acute kidney injury) ([place])   Type II diabetes mellitus with neurological manifestations (HCC)   Thrombocytosis (HCC)   Leukocytosis   Tachycardia   Patient is sent from SNF TO ED due to altered mental status/dehydration Per chart review patient has ongoing active issues since October with decrease oral intake, intermittent n/v,  weight loss. Lipase and lft wnl. Initial x ray concerns for aspiration pneumonia and possible ileus. He is started on abx/ivf and admitted to the hospital  N/V? Intermittent since October No n/v here in the hospital. No abdominal pain. He does has constipation on x ray.  Enema/stool softener   Gallstone on x ray, lft wnl/ lipase wnl. No abdominal pain.   abdominal US  Multiple gallstones packing the gallbladder lumen. No sonographic evidence of acute cholecystitis" "There are 3 hypoechoic masses within the right lobe with the largest measuring 4.2 x 3.1 x 3.8 cm.   Liver massesx3: concerning for cancer, will check cea and afp.  H/o erosive esophagitis: continue ppi  Aspiration pneumonia? With tachycardia, leukocytosis and lactic acidosis on presentation He was started on vanc/cefepime on admission. mrsa screen negative. D/c vanc, change cefepime to unasyn. Swallow eval, continue ivf.  Sinus tachycardia: iv lopressor, ddimer elevated, pretest probability is high for DVT/PE due to baseline immobile.  echo/venous doppler lower extremity pending,  empirically start on heparin drip for presumed PE, consider  CTA once cr improves, check tsh. cxr with possible pneumonia, V/Q scan may be suboptimal.   AKI onc ckd II/Urinary retention: bladder scan with 543, check psa, but likely neurogenic bladder /constipation ua no infection, Continue hydration Treat constipation, trial of flomax  Difficult foley placement, urology consulted Repeat bmp in am, renal dosing meds   Hypokalemia; replace k, check mag   HTN: now borderline bp, hold home meds norvasc, on iv lopressor with holding parameters. Continue hydration.  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   H/o CVA/seizure/dementia/dysphagia Change to iv keppra, aspiration precaution.   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 Daughter reports patient was discharged to [facility] after hospitalization in 05/2017, then he was sent to [facility], patient now is a long term resident at [facility] for 1-2 months due to it was determined that he was not able to participate rehab. Daughter has not seem patient for a few months but reports patient has been progressive decline and recent significant weight loss.  Per chart review, patient has been sent to ED multiple times recently due to not eating and dehydration.  Daughter reports patient declined feeding tube placement in the past. Daughter reports patient is a full code. I have  discussed with daughter that the progressive decline likely will be ongoing and less likely reversible. I have asked if they want to meet with  palliative care, daughter states she will discuss with the rest of the family. Patient is full code for now.    Very poor prognosis.   Code Status: full  Family Communication: daughter over the phone  Disposition Plan: pending   Consultants:  urology  Procedures:  Foley placement  Antibiotics:  Vanc/cefepime from admission to 12/13  unasyn from 12/13   Objective: BP 96/72 (BP Location: Right Arm)   Pulse 87   Temp 97.7 F (36.5 C) (Oral)   Resp (!) 22   SpO2 98%   Intake/Output Summary (Last 24 hours) at 09/14/2017 0754 Last data filed at 09/14/2017 0539 Gross per 24 hour  Intake 2305 ml  Output 500 ml  Net 1805 ml   There were no vitals filed for this visit.  Exam: Patient is examined daily including today on 09/14/2017, exams remain the same as of yesterday except that has changed    General:  Thin, frail, cachectic ,dehydrated,not oriented to time, know he is in the hospital, follow commands, dysarthria ( likely baseline), bilateral upper extremity contracture, left worse than right  Cardiovascular: sinus tachcycardia has improved  Respiratory: CTABL  Abdomen: Soft/ND/NT, positive BS  Musculoskeletal: No Edema, not able to lift legs against gravity  Neuro: alert, oriented to place and person, chronic upper extremity contractures  Data Reviewed: Basic Metabolic Panel: Recent Labs  Lab 09/12/17 09/12/17 1631 09/13/17 0410 09/14/17 0611  NA 132* 135 137 136  K 4.1 3.7 3.1* 4.1  CL  --  96* 103 106  CO2  --  26 25 21*  GLUCOSE  --  293* 95 125*  BUN 66* 66* 54* 47*  CREATININE 1.5* 1.68* 1.32* 1.36*  CALCIUM  --  9.7 8.9 8.8*  MG  --   --   --  2.5*   Liver Function Tests: Recent Labs  Lab 09/12/17 09/12/17 1631 09/13/17 0410  AST 14 20 20   ALT 5* 11* 9*  ALKPHOS 112 99 82  BILITOT  --  0.5 0.5  PROT  --  7.6 6.1*  ALBUMIN  --  2.7* 2.2*   No results for input(s): LIPASE, AMYLASE in the last 168 hours. No results for input(s):  AMMONIA in the last 168 hours. CBC: Recent Labs  Lab 09/12/17 09/12/17 1631 09/14/17 0611  WBC 14.5 15.6* 15.7*  NEUTROABS 12 13.3* 12.4*  HGB 10.1* 11.7* 9.1*  HCT 33* 38.4* 30.5*  MCV  --  71.9* 72.3*  PLT 638* 561* 511*   Cardiac Enzymes:   Recent Labs  Lab 09/13/17 0010 09/13/17 0410 09/13/17 0636  TROPONINI <0.03 <0.03 <0.03   BNP (last 3 results) No results for input(s): BNP in the last 8760 hours.  ProBNP (last 3 results) No results for input(s): PROBNP in the last 8760 hours.  CBG: Recent Labs  Lab 09/13/17 1136 09/13/17 1658 09/13/17 2133 09/14/17 0037 09/14/17 0327  GLUCAP 136* 128* 158* 80 88    Recent Results (from the past 240 hour(s))  Blood culture (routine x 2)     Status: None (Preliminary result)   Collection Time: 09/12/17  5:20 PM  Result Value Ref Range Status   Specimen Description BLOOD RIGHT ANTECUBITAL  Final   Special Requests   Final    BOTTLES DRAWN AEROBIC AND ANAEROBIC Blood Culture adequate volume   Culture   Final  NO GROWTH < 24 HOURS Performed at [facility] [address] [place], [place] [address]    Report Status PENDING  Incomplete  Respiratory Panel by PCR     Status: None   Collection Time: 09/12/17  9:47 PM  Result Value Ref Range Status   Adenovirus NOT DETECTED NOT DETECTED Final   Coronavirus 229E NOT DETECTED NOT DETECTED Final   Coronavirus HKU1 NOT DETECTED NOT DETECTED Final   Coronavirus NL63 NOT DETECTED NOT DETECTED Final   Coronavirus OC43 NOT DETECTED NOT DETECTED Final   Metapneumovirus NOT DETECTED NOT DETECTED Final   Rhinovirus / Enterovirus NOT DETECTED NOT DETECTED Final   Influenza A NOT DETECTED NOT DETECTED Final   Influenza B NOT DETECTED NOT DETECTED Final   Parainfluenza Virus 1 NOT DETECTED NOT DETECTED Final   Parainfluenza Virus 2 NOT DETECTED NOT DETECTED Final   Parainfluenza Virus 3 NOT DETECTED NOT DETECTED Final   Parainfluenza Virus 4 NOT DETECTED NOT DETECTED Final    Respiratory Syncytial Virus NOT DETECTED NOT DETECTED Final   Bordetella pertussis NOT DETECTED NOT DETECTED Final   Chlamydophila pneumoniae NOT DETECTED NOT DETECTED Final   Mycoplasma pneumoniae NOT DETECTED NOT DETECTED Final    Comment: Performed at [facility], [place] [address], [place], [place] [address]  MRSA PCR Screening     Status: None   Collection Time: 09/12/17  9:47 PM  Result Value Ref Range Status   MRSA by PCR NEGATIVE NEGATIVE Final    Comment:        The GeneXpert MRSA Assay (FDA approved for NASAL specimens only), is one component of a comprehensive MRSA colonization surveillance program. It is not intended to diagnose MRSA infection nor to guide or monitor treatment for MRSA infections.      Studies: US Abdomen Limited  Result Date: 09/13/2017 CLINICAL DATA:  Gallstones. EXAM: ULTRASOUND ABDOMEN LIMITED RIGHT UPPER QUADRANT COMPARISON:  Abdominal ultrasound of December 13, 2012 FINDINGS: Gallbladder: The gallbladder is filled with multiple stones and exhibits a wall echo shadow (WES sign) appearance. The gallbladder wall is thickened to just under 6 mm. There is no pericholecystic fluid or positive sonographic Murphy's sign. Common bile duct: Diameter: 4.5 mm Liver: The hepatic echotexture is heterogeneous. There are 3 hypoechoic masses within the right lobe with the largest measuring 4.2 x 3.1 x 3.8 cm. There is no intrahepatic ductal dilation. The left lobe is not well evaluated due to bowel gas. Portal vein is patent on color Doppler imaging with normal direction of blood flow towards the liver. IMPRESSION: Abnormal hypoechoic masses within the liver worrisome for malignancy. Hepatic protocol MRI is recommended. Multiple gallstones packing the gallbladder lumen. No sonographic evidence of acute cholecystitis. Electronically Signed   By: David  Martinique M.D.   On: 09/13/2017 16:59   Dg Abd 2 Views  Result Date: 09/13/2017 CLINICAL DATA:  Follow-up ileus EXAM:  ABDOMEN - 2 VIEW COMPARISON:  Abdomen films of 07/17/2017 FINDINGS: Both large and small bowel gas is present without evidence of bowel obstruction. This appearance could be due to aerophagia or mild ileus. Multiple calcified gallstones again are noted within the right upper quadrant. Also there are calcifications overlying the epigastrium consistent with chronic calcific pancreatitis. Degenerative disc disease at L5-S1 is present. IMPRESSION: 1. Both large and small bowel gas is present without significant distension, possibly due to aerophagia or very minimal ileus. 2. Multiple gallstones. 3. Changes of chronic calcific pancreatitis. Electronically Signed   By: Eddie Dibbles  Alvester Chou M.D.   On: 09/13/2017 10:09    Scheduled Meds: . ARIPiprazole  2 mg Oral Daily  . aspirin EC  81 mg Oral Daily  . atorvastatin  80 mg Oral q1800  . bisacodyl  10 mg Rectal Daily  . cholecalciferol  2,000 Units Oral Daily  . feeding supplement (PRO-STAT SUGAR FREE 64)  30 mL Oral BID  . folic acid  1 mg Oral Daily  . insulin aspart  0-9 Units Subcutaneous Q4H  . lipase/protease/amylase  12,000 Units Oral TID WC  . metoprolol tartrate  2.5 mg Intravenous Q6H  . pantoprazole (PROTONIX) IV  40 mg Intravenous Q12H  . polyethylene glycol  17 g Oral Daily  . senna-docusate  1 tablet Oral BID  . sertraline  50 mg Oral Daily    Continuous Infusions: . ampicillin-sulbactam (UNASYN) IV Stopped (09/14/17 6468)  . heparin 1,000 Units/hr (09/13/17 2058)  . levETIRAcetam Stopped (09/13/17 2156)     Time spent: 48mins I have personally reviewed and interpreted on  09/14/2017 daily labs, tele strips, imagings as discussed above under date review session and assessment and plans.  I reviewed all nursing notes, pharmacy notes,  vitals, pertinent old records  I have discussed plan of care as described above with RN , patient and family on 09/14/2017   Florencia Reasons MD, PhD  [facility] Pager [phone]. If 7PM-7AM, please contact  night-coverage at [email or web address], password [facility] 09/14/2017, 7:54 AM  LOS: 2 days

## 2017-09-14 NOTE — Procedures (Signed)
Foley Catheter Placement Note  Indications: 76 y.o. male with AMS and pneumonia with difficult foley catheterization and hematuria after multiple tries. Bladder scan > 1 L  Pre-operative Diagnosis: Urinary retention  Post-operative Diagnosis: Same  Surgeon: Alla Feeling, MD, MD  Assistants: None  Procedure Details  Patient was placed in the supine position, prepped with Betadine and draped in the usual sterile fashion.  We injected lidocaine jelly per urethra prior to the procedure.  We then inserted a 22 [place] coude tip hematuria catheter per urethra which easily passed into the bladder without any resistance at the prostatic urethra.  We achieved return of clear yellow urine and then proceeded to insert 10 mL of sterile water into the Foley balloon.  The catheter was attached to a drainage bag and secured with a StatLock.  Placement of the catheter had return of greater than 300 mL of clear urine. The foley was flushed with no return of clot               Complications: None; patient tolerated the procedure well.  Plan:   1.  Continue Foley catheter to drainage for 7 days due to multiple catheter attempts and inability to void 2.  Remove Foley catheter   in 7 days with trial of void.  If this is not possible then contact [facility] and we will arrange a   removal of Catheter and trial of void.       Attending Attestation: Dr. Gloriann Loan was available.

## 2017-09-14 NOTE — Progress Notes (Signed)
SLP Cancellation Note  Patient Details Name: [name]. MRN: [id number] DOB: 1941/02/06   Cancelled treatment:       Reason Eval/Treat Not Completed: Other (comment);Fatigue/lethargy limiting ability to participate(pt politely declined to wake up to eat, stating he will "later", when MD deems appropriate would advance to dys3/ground meats/thin diet, will follow up due to pt's pna and h/o dysphagia)   Macario Golds 09/14/2017, 8:13 AM  Luanna Salk, [place] SLP [phone]

## 2017-09-14 NOTE — Progress Notes (Signed)
*  Preliminary Results* Bilateral lower extremity venous duplex completed. Right lower extremity is negative for deep vein thrombosis.  The left lower extremity exhibits isolated age indeterminate deep vein thrombosis involving a small segment of the distal left femoral vein. There is no evidence of Baker's cyst bilaterally.  Preliminary results discussed with Kerry Dory, RN.  09/14/2017 4:03 PM Maudry Mayhew, BS, RVT, RDCS, RDMS

## 2017-09-14 NOTE — Progress Notes (Signed)
ANTICOAGULATION CONSULT NOTE - Follow Up Consult  Pharmacy Consult for Heparin Indication: pulmonary embolus  Allergies  Allergen Reactions  . Dilantin [Phenytoin Sodium Extended] Other (See Comments)    Developed Complete Heart Block following 1 dose    Patient Measurements:    Height: 69 inches Weight: 56.8kg Heparin Dosing Weight: 56.8kg  Vital Signs: Temp: 97.5 F (36.4 C) (12/14 1408) Temp Source: Axillary (12/14 1408) BP: 117/77 (12/14 1408) Pulse Rate: 131 (12/14 1408)  Labs: Recent Labs    09/12/17 09/12/17 1631 09/13/17 0010 09/13/17 0410 09/13/17 0636 09/14/17 0611 09/14/17 0623 09/14/17 1721  HGB 10.1* 11.7*  --   --   --  9.1*  --   --   HCT 33* 38.4*  --   --   --  30.5*  --   --   PLT 638* 561*  --   --   --  511*  --   --   HEPARINUNFRC  --   --   --   --   --   --  0.93* 0.18*  CREATININE 1.5* 1.68*  --  1.32*  --  1.36*  --   --   TROPONINI  --   --  <0.03 <0.03 <0.03  --   --   --     Estimated Creatinine Clearance: 36.5 mL/min (A) (by C-G formula based on SCr of 1.36 mg/dL (H)).   Medications:  Scheduled:  . ARIPiprazole  2 mg Oral Daily  . aspirin EC  81 mg Oral Daily  . atorvastatin  80 mg Oral q1800  . cholecalciferol  2,000 Units Oral Daily  . feeding supplement  1 Container Oral BID BM  . folic acid  1 mg Oral Daily  . insulin aspart  0-9 Units Subcutaneous Q4H  . lipase/protease/amylase  12,000 Units Oral TID WC  . metoprolol tartrate  5 mg Intravenous Q6H  . pantoprazole (PROTONIX) IV  40 mg Intravenous Q12H  . polyethylene glycol  17 g Oral Daily  . senna-docusate  1 tablet Oral BID  . sertraline  50 mg Oral Daily  . tamsulosin  0.4 mg Oral Daily   Infusions:  . sodium chloride 75 mL/hr at 09/14/17 1634  . ampicillin-sulbactam (UNASYN) IV Stopped (09/14/17 1340)  . heparin    . levETIRAcetam Stopped (09/14/17 1043)   PRN: alum & mag hydroxide-simeth, ondansetron  Assessment: 76 yo with presumed PE.  Pharmacy consulted  to dose heparin for presumed PE and Unasyn for aspiration PNA.  D dimer 6.91, LMWH 30 given 12/12 at 2217. Hg 11.7, PLTC 561, Cr 1.68>1.32   Today, 09/14/17  AM labs Heparin level is 0.93, supratherapeutic  Hgb 9.1, plt 511  SCr 1.36, Crcl is 37 ml/min  No line or bleeding issues per RN  6:37 PM Heparin level SUBtherapeutic (0.18) on 900 units/hr Per discussion with RN, no interruptions with IV infusion, no bleeding complications  Goal of Therapy:  Heparin level 0.3-0.7 units/ml Monitor platelets by anticoagulation protocol: Yes   Plan:   Increase heparin to 950 units/hr  Check heparin level 8hrs after rate change  Daily heparin level and CBC  Peggyann Juba, PharmD, BCPS Pager: [phone] 09/14/2017,6:36 PM

## 2017-09-14 NOTE — Progress Notes (Addendum)
Initial Nutrition Assessment  DOCUMENTATION CODES:   Severe malnutrition in context of chronic illness  INTERVENTION:   Monitor for diet advancement/toleration Boost Breeze po BID, each supplement provides 250 kcal and 9 grams of protein  NUTRITION DIAGNOSIS:   Severe Malnutrition related to chronic illness(dementia) as evidenced by severe fat depletion, severe muscle depletion.  GOAL:   Patient will meet greater than or equal to 90% of their needs  MONITOR:   Supplement acceptance, PO intake, Labs, Weight trends, I & O's  REASON FOR ASSESSMENT:   Malnutrition Screening Tool    ASSESSMENT:   Pt with PMH significant for dementia, HTN, DM with neuropathy, CAD, alcohol abuse, chronic calcified pancreatitis, and CVA. Presents this admission with decrease level of consciousness while at SNF. Admitted for pneumonia. Per chart review, patient has ongoing issues since October with decreased oral intake, wt loss, and intermittent nausea/vomiting. Pt has been sent to ED multiple times recently due to poor intake and dehydration. Pt noted to have refused feeding tube in the past. Palliative to possibly meet with family.    12/14- SLP recommends diet advancement to DYS 3, ground meats, and thin liquids when medically feasible, RD to provide Ensure Enlive for increased calories and protein once advanced.   Pt noted to eat 100% of last two CLD meals. Pt unable to wake upon RD visit. No family at bedside to obtain any further history.   MD notes daughter has not seen pt in few months but reports pt looks to have lost significant amount of weight. Records indicate pt weighed around 131 lb during his admission in August 2018. This shows a 4.5 % weight loss in 4 months. Not significant for time frame.   Nutrition-Focused physical exam completed. Pt noted to have severe muscle depletion on bilateral lower extremities. Pt noted to be wheelchair bound for a few years. Suspect some losses are related  to immobility.   Medications reviewed and include: Vit D, SSI, creon, IV abx, Keppra Labs reviewed: BUN 47 (H) Creatinine 1.36 (H) Mg 2.5 (H) A1C 10.5   NUTRITION - FOCUSED PHYSICAL EXAM:    Most Recent Value  Orbital Region  Severe depletion  Upper Arm Region  Severe depletion  Thoracic and Lumbar Region  Unable to assess  Buccal Region  Severe depletion  Temple Region  Severe depletion  Clavicle Bone Region  Severe depletion  Clavicle and Acromion Bone Region  Severe depletion  Scapular Bone Region  Unable to assess  Dorsal Hand  Severe depletion  Patellar Region  Severe depletion  Anterior Thigh Region  Severe depletion  Posterior Calf Region  Severe depletion  Edema (RD Assessment)  Mild  Hair  Reviewed  Eyes  Reviewed  Mouth  Reviewed  Skin  Reviewed  Nails  Reviewed       Diet Order:  Diet clear liquid Room service appropriate? Yes; Fluid consistency: Thin  EDUCATION NEEDS:   Not appropriate for education at this time  Skin:  Skin Assessment: Reviewed RN Assessment  Last BM:  PTA  Height:   Ht Readings from Last 1 Encounters:  09/12/17 5\' 9"  (1.753 m)    Weight:   Wt Readings from Last 1 Encounters:  09/12/17 125 lb 4.8 oz (56.8 kg)    Ideal Body Weight:  72.7 kg  BMI:  There is no height or weight on file to calculate BMI.  Estimated Nutritional Needs:   Kcal:  1800-2000 kcal/day  Protein:  85-95 g/day  Fluid:  >1.8 L/day  Mariana Single RD, LDN Clinical Nutrition Pager # [phone]

## 2017-09-14 NOTE — Progress Notes (Signed)
Bladder scan at 1000; patient retended 510ml; notified MD; urologist placed foley catheter; no bleeding symptoms.
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
## 2017-09-15 LAB — COMPREHENSIVE METABOLIC PANEL
ALBUMIN: 2.2 g/dL — AB (ref 3.5–5.0)
ALT: 9 U/L — ABNORMAL LOW (ref 17–63)
ANION GAP: 9 (ref 5–15)
AST: 21 U/L (ref 15–41)
Alkaline Phosphatase: 83 U/L (ref 38–126)
BILIRUBIN TOTAL: 0.5 mg/dL (ref 0.3–1.2)
BUN: 41 mg/dL — AB (ref 6–20)
CHLORIDE: 110 mmol/L (ref 101–111)
CO2: 21 mmol/L — AB (ref 22–32)
Calcium: 8.6 mg/dL — ABNORMAL LOW (ref 8.9–10.3)
Creatinine, Ser: 1.25 mg/dL — ABNORMAL HIGH (ref 0.61–1.24)
GFR calc Af Amer: >60 mL/min (ref >60)
GFR calc non Af Amer: 54 mL/min — ABNORMAL LOW (ref >60)
GLUCOSE: 106 mg/dL — AB (ref 65–99)
POTASSIUM: 3.9 mmol/L (ref 3.5–5.1)
SODIUM: 140 mmol/L (ref 135–145)
Total Protein: 5.8 g/dL — ABNORMAL LOW (ref 6.5–8.1)

## 2017-09-15 LAB — GLUCOSE, CAPILLARY
GLUCOSE-CAPILLARY: 105 mg/dL — AB (ref 65–99)
GLUCOSE-CAPILLARY: 106 mg/dL — AB (ref 65–99)
GLUCOSE-CAPILLARY: 121 mg/dL — AB (ref 65–99)
GLUCOSE-CAPILLARY: 126 mg/dL — AB (ref 65–99)
GLUCOSE-CAPILLARY: 134 mg/dL — AB (ref 65–99)
GLUCOSE-CAPILLARY: 144 mg/dL — AB (ref 65–99)
GLUCOSE-CAPILLARY: 144 mg/dL — AB (ref 65–99)

## 2017-09-15 LAB — IRON AND TIBC
Iron: 12 ug/dL — ABNORMAL LOW (ref 45–182)
SATURATION RATIOS: 5 % — AB (ref 17.9–39.5)
TIBC: 259 ug/dL (ref 250–450)
UIBC: 247 ug/dL

## 2017-09-15 LAB — RETICULOCYTES
RBC.: 4.39 MIL/uL (ref 4.22–5.81)
RETIC COUNT ABSOLUTE: 83.4 10*3/uL (ref 19.0–186.0)
Retic Ct Pct: 1.9 % (ref 0.4–3.1)

## 2017-09-15 LAB — VITAMIN B12: Vitamin B-12: 1273 pg/mL — ABNORMAL HIGH (ref 180–914)

## 2017-09-15 LAB — CBC
HEMATOCRIT: 32.3 % — AB (ref 39.0–52.0)
Hemoglobin: 9.5 g/dL — ABNORMAL LOW (ref 13.0–17.0)
MCH: 21.6 pg — ABNORMAL LOW (ref 26.0–34.0)
MCHC: 29.4 g/dL — AB (ref 30.0–36.0)
MCV: 73.6 fL — ABNORMAL LOW (ref 78.0–100.0)
Platelets: 518 10*3/uL — ABNORMAL HIGH (ref 150–400)
RBC: 4.39 MIL/uL (ref 4.22–5.81)
RDW: 20.3 % — AB (ref 11.5–15.5)
WBC: 16.5 10*3/uL — AB (ref 4.0–10.5)

## 2017-09-15 LAB — PROTIME-INR
INR: 2.62
Prothrombin Time: 27.8 seconds — ABNORMAL HIGH (ref 11.4–15.2)

## 2017-09-15 LAB — AMMONIA: Ammonia: 29 umol/L (ref 9–35)

## 2017-09-15 LAB — PSA: Prostatic Specific Antigen: 3.21 ng/mL (ref 0.00–4.00)

## 2017-09-15 LAB — LIPASE, BLOOD: Lipase: 12 U/L (ref 11–51)

## 2017-09-15 LAB — HEPARIN LEVEL (UNFRACTIONATED)

## 2017-09-15 LAB — FOLATE: FOLATE: 44.4 ng/mL (ref >5.9)

## 2017-09-15 LAB — LEGIONELLA PNEUMOPHILA SEROGP 1 UR AG: L. pneumophila Serogp 1 Ur Ag: NEGATIVE

## 2017-09-15 MED ORDER — LEVETIRACETAM 500 MG PO TABS
500.0000 mg | ORAL_TABLET | Freq: Two times a day (BID) | ORAL | Status: DC
  Administered 2017-09-15 – 2017-09-17 (×4): 500 mg via ORAL
  Filled 2017-09-15 (×4): qty 1

## 2017-09-15 MED ORDER — METOPROLOL TARTRATE 12.5 MG HALF TABLET
12.5000 mg | ORAL_TABLET | Freq: Two times a day (BID) | ORAL | Status: DC
  Administered 2017-09-15 – 2017-09-17 (×4): 12.5 mg via ORAL
  Filled 2017-09-15 (×4): qty 1

## 2017-09-15 MED ORDER — ENOXAPARIN SODIUM 60 MG/0.6ML ~~LOC~~ SOLN
60.0000 mg | Freq: Once | SUBCUTANEOUS | Status: AC
  Administered 2017-09-15: 18:00:00 60 mg via SUBCUTANEOUS
  Filled 2017-09-15: qty 0.6

## 2017-09-15 MED ORDER — LACTATED RINGERS IV BOLUS (SEPSIS)
1000.0000 mL | Freq: Once | INTRAVENOUS | Status: AC
  Administered 2017-09-15: 1000 mL via INTRAVENOUS

## 2017-09-15 MED ORDER — AMOXICILLIN-POT CLAVULANATE 500-125 MG PO TABS
1.0000 | ORAL_TABLET | Freq: Two times a day (BID) | ORAL | Status: DC
  Administered 2017-09-15 – 2017-09-16 (×2): 500 mg via ORAL
  Filled 2017-09-15 (×3): qty 1

## 2017-09-15 MED ORDER — ENOXAPARIN SODIUM 60 MG/0.6ML ~~LOC~~ SOLN: 60.0000 mg | Freq: Once | SUBCUTANEOUS | Status: DC

## 2017-09-15 MED ORDER — ENOXAPARIN SODIUM 60 MG/0.6ML ~~LOC~~ SOLN
60.0000 mg | Freq: Two times a day (BID) | SUBCUTANEOUS | Status: AC
  Administered 2017-09-16 – 2017-09-17 (×4): 60 mg via SUBCUTANEOUS
  Filled 2017-09-15 (×4): qty 0.6

## 2017-09-15 MED ORDER — LACTATED RINGERS IV SOLN
INTRAVENOUS | Status: DC
  Administered 2017-09-15 – 2017-09-19 (×5): via INTRAVENOUS

## 2017-09-15 MED ORDER — METOPROLOL TARTRATE 5 MG/5ML IV SOLN
5.0000 mg | Freq: Four times a day (QID) | INTRAVENOUS | Status: DC | PRN
  Administered 2017-09-15 – 2017-09-23 (×5): 5 mg via INTRAVENOUS
  Filled 2017-09-15 (×4): qty 5
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
## 2017-09-15 NOTE — Progress Notes (Signed)
Spoke with rebecca RN re Midline vs PICC placement.  States she spoke with MD , who does not want a PICC.  States a PIV or midline will be sufficient.  Night shift Rn to attempt placement.

## 2017-09-15 NOTE — Progress Notes (Addendum)
PROGRESS NOTE  [name]. PYP:[id number] DOB: [birth year] DOA: 09/12/2017 PCP: [name], DO  HPI/Recap of past 24 hours:  Foley placed by urology on 12/14 Had bm last night Vomited x1 this am He denies ab pain He seems more alert and interactive No cough, on room air  Son in room   Assessment/Plan: Principal Problem:   HCAP (healthcare-associated pneumonia) Active Problems:   Altered mental status   AKI (acute kidney injury) ([place])   Type II diabetes mellitus with neurological manifestations ([place])   Thrombocytosis ([place])   Leukocytosis   Tachycardia   Patient is sent from SNF TO ED due to altered mental status/dehydration Per chart review patient has ongoing active issues since October with decrease oral intake, intermittent n/v,  weight loss. Lipase and lft wnl. Initial x ray concerns for aspiration pneumonia and possible ileus. He is started on abx/ivf and admitted to the hospital  N/V Intermittent since October -Denies  abdominal pain. He does has constipation on x ray.  -Enema/stool softener , had bm on 12/15 -vomited on 12/15  Gallstone on x ray, lft wnl/ lipase wnl. No abdominal pain.   abdominal US  Multiple gallstones packing the gallbladder lumen. No sonographic evidence of acute cholecystitis" "There are 3 hypoechoic masses within the right lobe with the largest measuring 4.2 x 3.1 x 3.8 cm.   Liver massesx3: concerning for cancer, cea and afp in process. MRI liver ordered  H/o erosive esophagitis: continue ppi  Aspiration pneumonia? With tachycardia, leukocytosis and lactic acidosis on presentation -He was started on vanc/cefepime on admission. -mrsa screen negative.  vanc d/ed, change cefepime to unasyn. -Swallow eval, continue ivf.  Sinus tachycardia: iv lopressor, ddimer elevated, pretest probability is high for DVT/PE due to baseline immobile.  -Venous doppler +acute DVT involving a very small segment of the distal Femoral vein left  leg. -Echo no right heart strain , lvef wnl, troponin negative.  -continue heparin drip for confirmed DVT and  presumed P - CTA chest  once cr improves, cxr with possible pneumonia, V/Q scan may be suboptimal.  - tsh 2.9  AKI onc CKD II/Urinary retention: -bladder scan with 543 initially on presentation,  psa 3.2,  -urinary retention likely from neurogenic bladder /constipation -ua no infection,  -Difficult foley placement, urology consulted, foley placed by urology on 12/14, Treat constipation, trial of flomax  -cr improving, Continue hydration, Repeat bmp in am, renal dosing meds   Hypokalemia; replace k,  Mag 2.5   HTN: now borderline bp, hold home meds norvasc, on iv lopressor with holding parameters. Continue hydration.  Insulin dependent dm2 -a1c 10.5 -Patient does not take oral consistantly, on ssi and hypoglycemia protocol.   H/o CVA/seizure/dementia/dysphagia Oriented to person, know he is in the hospital. States it is June. Change to iv keppra, aspiration precaution.   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 Daughter reports patient was discharged to [facility] after hospitalization in 05/2017, then he was sent to [facility], patient now is a long term resident at [facility] for 1-2 months due to it was determined that he was not able to participate  rehab. Daughter has not seem patient for a few months but reports patient has been progressive decline and recent significant weight loss.  Per chart review, patient has been sent to ED multiple times recently  due to not eating and dehydration.  Daughter reports patient declined feeding tube placement in the past. Daughter reports patient is a full code. I have discussed with daughter that the progressive decline likely will be ongoing and less likely reversible. I have asked if they want to meet with palliative care, daughter states she will discuss with the rest of the family. Patient is full code for now.    Very poor prognosis.    Addendum: RN report patient loss iv access, will convert heparin drip to lovenox subq, change iv meds to oral  Awaiting for iv placement  Code Status: full  Family Communication: daughter over the phone on 12/13, son at bedside on 12/15  Disposition Plan: pending   Consultants:  Urology  Palliative care  Procedures:  Foley placement  Antibiotics:  Vanc/cefepime from admission to 12/13  unasyn from 12/13   Objective: BP 102/77 (BP Location: Right Arm)   Pulse (!) 135 Comment: nurse notified  Temp 97.8 F (36.6 C) (Oral)   Resp 18   SpO2 100%   Intake/Output Summary (Last 24 hours) at 09/15/2017 0857 Last data filed at 09/15/2017 0528 Gross per 24 hour  Intake 3034.48 ml  Output 950 ml  Net 2084.48 ml   There were no vitals filed for this visit.  Exam: Patient is examined daily including today on 09/15/2017, exams remain the same as of yesterday except that has changed    General:  Thin, frail, cachectic ,dehydrated,not oriented to time, know he is in the hospital, follow commands, dysarthria ( likely baseline), bilateral upper extremity contracture, left worse than right  Cardiovascular: sinus tachcycardia has improved  Respiratory: diminished at basis, no wheezing, no rales, no rhonchi  Abdomen: Soft/ND/NT, positive BS  Musculoskeletal: No Edema,  able to lift legs against gravity today, but not able to lift against resistance  Neuro: alert, oriented to place and person, chronic upper extremity contractures  Data  Reviewed: Basic Metabolic Panel: Recent Labs  Lab 09/12/17 09/12/17 1631 09/13/17 0410 09/14/17 0611 09/15/17 0625  NA 132* 135 137 136 140  K 4.1 3.7 3.1* 4.1 3.9  CL  --  96* 103 106 110  CO2  --  26 25 21* 21*  GLUCOSE  --  293* 95 125* 106*  BUN 66* 66* 54* 47* 41*  CREATININE 1.5* 1.68* 1.32* 1.36* 1.25*  CALCIUM  --  9.7 8.9 8.8* 8.6*  MG  --   --   --  2.5*  --    Liver Function Tests: Recent Labs  Lab 09/12/17 09/12/17 1631 09/13/17 0410 09/15/17 0625  AST 14 20 20 21   ALT 5* 11* 9* 9*  ALKPHOS 112 99 82 83  BILITOT  --  0.5 0.5 0.5  PROT  --  7.6 6.1* 5.8*  ALBUMIN  --  2.7* 2.2* 2.2*   Recent Labs  Lab 09/15/17 0625  LIPASE 12   Recent Labs  Lab 09/15/17 0625  AMMONIA 29   CBC: Recent Labs  Lab 09/12/17 09/12/17 1631 09/14/17 0611 09/15/17 0625  WBC 14.5 15.6* 15.7* 16.5*  NEUTROABS 12 13.3* 12.4*  --   HGB 10.1* 11.7* 9.1* 9.5*  HCT 33* 38.4* 30.5* 32.3*  MCV  --  71.9* 72.3* 73.6*  PLT 638* 561* 511* 518*  Cardiac Enzymes:   Recent Labs  Lab 09/13/17 0010 09/13/17 0410 09/13/17 0636  TROPONINI <0.03 <0.03 <0.03   BNP (last 3 results) No results for input(s): BNP in the last 8760 hours.  ProBNP (last 3 results) No results for input(s): PROBNP in the last 8760 hours.  CBG: Recent Labs  Lab 09/14/17 1632 09/14/17 2022 09/15/17 0037 09/15/17 0447 09/15/17 0739  GLUCAP 196* 147* 144* 121* 105*    Recent Results (from the past 240 hour(s))  Blood culture (routine x 2)     Status: None (Preliminary result)   Collection Time: 09/12/17  5:20 PM  Result Value Ref Range Status   Specimen Description BLOOD RIGHT ANTECUBITAL  Final   Special Requests   Final    BOTTLES DRAWN AEROBIC AND ANAEROBIC Blood Culture adequate volume   Culture   Final    NO GROWTH 2 DAYS Performed at [facility], [place] [address], [place], [place] [address]    Report Status PENDING  Incomplete  Urine culture     Status: None   Collection Time:  09/12/17  5:42 PM  Result Value Ref Range Status   Specimen Description URINE, CATHETERIZED  Final   Special Requests NONE  Final   Culture   Final    NO GROWTH Performed at [facility], [address], [place], [place] [address]    Report Status 09/14/2017 FINAL  Final  Respiratory Panel by PCR     Status: None   Collection Time: 09/12/17  9:47 PM  Result Value Ref Range Status   Adenovirus NOT DETECTED NOT DETECTED Final   Coronavirus 229E NOT DETECTED NOT DETECTED Final   Coronavirus HKU1 NOT DETECTED NOT DETECTED Final   Coronavirus NL63 NOT DETECTED NOT DETECTED Final   Coronavirus OC43 NOT DETECTED NOT DETECTED Final   Metapneumovirus NOT DETECTED NOT DETECTED Final   Rhinovirus / Enterovirus NOT DETECTED NOT DETECTED Final   Influenza A NOT DETECTED NOT DETECTED Final   Influenza B NOT DETECTED NOT DETECTED Final   Parainfluenza Virus 1 NOT DETECTED NOT DETECTED Final   Parainfluenza Virus 2 NOT DETECTED NOT DETECTED Final   Parainfluenza Virus 3 NOT DETECTED NOT DETECTED Final   Parainfluenza Virus 4 NOT DETECTED NOT DETECTED Final   Respiratory Syncytial Virus NOT DETECTED NOT DETECTED Final   Bordetella pertussis NOT DETECTED NOT DETECTED Final   Chlamydophila pneumoniae NOT DETECTED NOT DETECTED Final   Mycoplasma pneumoniae NOT DETECTED NOT DETECTED Final    Comment: Performed at [facility], [place] [address] [place], [place] [address]  MRSA PCR Screening     Status: None   Collection Time: 09/12/17  9:47 PM  Result Value Ref Range Status   MRSA by PCR NEGATIVE NEGATIVE Final    Comment:        The GeneXpert MRSA Assay (FDA approved for NASAL specimens only), is one component of a comprehensive MRSA colonization surveillance program. It is not intended to diagnose MRSA infection nor to guide or monitor treatment for MRSA infections.   Culture, blood (routine x 2) Call MD if unable to obtain prior to antibiotics being given     Status: None  (Preliminary result)   Collection Time: 09/12/17 11:07 PM  Result Value Ref Range Status   Specimen Description BLOOD RIGHT ANTECUBITAL  Final   Special Requests IN PEDIATRIC BOTTLE Blood Culture adequate volume  Final   Culture   Final    NO GROWTH 1 DAY Performed  at [facility], [place] [address] [place], [place] [address]    Report Status PENDING  Incomplete     Studies: No results found.  Scheduled Meds: . ARIPiprazole  2 mg Oral Daily  . aspirin EC  81 mg Oral Daily  . atorvastatin  80 mg Oral q1800  . cholecalciferol  2,000 Units Oral Daily  . feeding supplement  1 Container Oral BID BM  . folic acid  1 mg Oral Daily  . insulin aspart  0-9 Units Subcutaneous Q4H  . lipase/protease/amylase  12,000 Units Oral TID WC  . metoprolol tartrate  5 mg Intravenous Q6H  . pantoprazole (PROTONIX) IV  40 mg Intravenous Q12H  . polyethylene glycol  17 g Oral Daily  . senna-docusate  1 tablet Oral BID  . sertraline  50 mg Oral Daily  . tamsulosin  0.4 mg Oral Daily    Continuous Infusions: . ampicillin-sulbactam (UNASYN) IV Stopped (09/15/17 8403)  . heparin 950 Units/hr (09/14/17 2010)  . lactated ringers    . lactated ringers    . levETIRAcetam Stopped (09/14/17 2144)     Time spent: 41mins I have personally reviewed and interpreted on  09/15/2017 daily labs, tele strips, imagings as discussed above under date review session and assessment and plans.  I reviewed all nursing notes, pharmacy notes, consult note, vitals, pertinent old records  I have discussed plan of care as described above with RN , patient and family on 09/15/2017   Florencia Reasons MD, PhD  [facility] Pager [phone]. If 7PM-7AM, please contact night-coverage at [email or web address], password [facility] 09/15/2017, 8:57 AM  LOS: 3 days

## 2017-09-15 NOTE — Progress Notes (Signed)
Patient lost IV access.  IV team was unsuccessful after 3 attempts.  IV fluids and IV heparin on hold. Pharmacy and Dr. Erlinda Hong notified via text page.

## 2017-09-15 NOTE — Progress Notes (Addendum)
ANTICOAGULATION CONSULT NOTE - Follow Up Consult  Pharmacy Consult for Heparin Indication: r/o pulmonary embolus  Allergies  Allergen Reactions  . Dilantin [Phenytoin Sodium Extended] Other (See Comments)    Developed Complete Heart Block following 1 dose    Patient Measurements: Height: 69 inches Weight: 56.8kg Heparin Dosing Weight: 56.8kg  Vital Signs: Temp: 97.8 F (36.6 C) (12/15 0527) Temp Source: Oral (12/15 0527) BP: 102/77 (12/15 0527) Pulse Rate: 135 (12/15 0527)  Labs: Recent Labs    09/12/17 1631 09/13/17 0010 09/13/17 0410 09/13/17 0636 09/14/17 6503 09/14/17 5465 09/14/17 1721 09/15/17 0603 09/15/17 0604 09/15/17 0625  HGB 11.7*  --   --   --  9.1*  --   --   --   --  9.5*  HCT 38.4*  --   --   --  30.5*  --   --   --   --  32.3*  PLT 561*  --   --   --  511*  --   --   --   --  518*  LABPROT  --   --   --   --   --   --   --  27.8*  --   --   INR  --   --   --   --   --   --   --  2.62  --   --   HEPARINUNFRC  --   --   --   --   --  0.93* 0.18*  --  <0.10*  --   CREATININE 1.68*  --  1.32*  --  1.36*  --   --   --   --  1.25*  TROPONINI  --  <0.03 <0.03 <0.03  --   --   --   --   --   --     Estimated Creatinine Clearance: 39.8 mL/min (A) (by C-G formula based on SCr of 1.25 mg/dL (H)).   Medications:  Scheduled:  . ARIPiprazole  2 mg Oral Daily  . aspirin EC  81 mg Oral Daily  . atorvastatin  80 mg Oral q1800  . cholecalciferol  2,000 Units Oral Daily  . feeding supplement  1 Container Oral BID BM  . folic acid  1 mg Oral Daily  . insulin aspart  0-9 Units Subcutaneous Q4H  . lipase/protease/amylase  12,000 Units Oral TID WC  . metoprolol tartrate  5 mg Intravenous Q6H  . pantoprazole (PROTONIX) IV  40 mg Intravenous Q12H  . polyethylene glycol  17 g Oral Daily  . senna-docusate  1 tablet Oral BID  . sertraline  50 mg Oral Daily  . tamsulosin  0.4 mg Oral Daily   Infusions:  . ampicillin-sulbactam (UNASYN) IV Stopped (09/15/17 6812)   . heparin 950 Units/hr (09/14/17 2010)  . lactated ringers 1,000 mL (09/15/17 0908)  . lactated ringers    . levETIRAcetam Stopped (09/14/17 2144)   PRN: alum & mag hydroxide-simeth, ondansetron  Assessment: 76 y.o M presented to the ED on 09/12/17 with AMS and liver masses.  D-Dimer was found to be elevated at 6.91 on 12/13.  Plan for chest CTA when renal function improves.  Heparin started on 12/13 for r/o PE.  - 12/14 LE doppler: RLE neg for DVT; LLE "exhibits isolated age indeterminate deep vein thrombosis involving a small segment of the distal left femoral vein"   Today, 09/14/17 - heparin level is undetectable this morning but Patient's RN today stated that  she got report from third shift RN that patient IV came out during the "night" and not sure how long the heparin drip was off or when it was restarted - cbc relatively stable - no bleeding documented - scr down 1.25 (crcl~40) - INR elevated at 2.62 (pt's not on warfarin or DOAC, LFTs wnl)   Goal of Therapy:  Heparin level 0.3-0.7 units/ml Monitor platelets by anticoagulation protocol: Yes   Plan:  - since unsure how long heparin drip was off and when it was restarted, will recheck heparin level at 2PM today - continue heparin drip at 950 units/hr for now - recheck INR to ensure elevated INR is not lab error - monitor for s/s bleeding  Dia Sitter, PharmD, BCPS 09/15/2017 10:29 AM  ___________________________________  Adden: Patient loss IV access at 4:16pm. To transition patient from heparin to lovenox. - Rn also reported lab was not able to collect any other labs at this time including INR. - lovenox 60 mg SQ q12h with first dose at [place], PharmD, BCPS 09/15/2017 4:45 PM

## 2017-09-16 LAB — MAGNESIUM: MAGNESIUM: 2 mg/dL (ref 1.7–2.4)

## 2017-09-16 LAB — CEA: CEA1: 2.7 ng/mL (ref 0.0–4.7)

## 2017-09-16 LAB — BASIC METABOLIC PANEL
Anion gap: 9 (ref 5–15)
BUN: 31 mg/dL — AB (ref 6–20)
CHLORIDE: 111 mmol/L (ref 101–111)
CO2: 19 mmol/L — AB (ref 22–32)
CREATININE: 1.07 mg/dL (ref 0.61–1.24)
Calcium: 8.6 mg/dL — ABNORMAL LOW (ref 8.9–10.3)
GFR calc non Af Amer: >60 mL/min (ref >60)
GLUCOSE: 145 mg/dL — AB (ref 65–99)
Potassium: 4 mmol/L (ref 3.5–5.1)
Sodium: 139 mmol/L (ref 135–145)

## 2017-09-16 LAB — AFP TUMOR MARKER: AFP, Serum, Tumor Marker: 1.4 ng/mL (ref 0.0–8.3)

## 2017-09-16 LAB — GLUCOSE, CAPILLARY
GLUCOSE-CAPILLARY: 149 mg/dL — AB (ref 65–99)
GLUCOSE-CAPILLARY: 129 mg/dL — AB (ref 65–99)
Glucose-Capillary: 137 mg/dL — ABNORMAL HIGH (ref 65–99)
Glucose-Capillary: 147 mg/dL — ABNORMAL HIGH (ref 65–99)
Glucose-Capillary: 144 mg/dL — ABNORMAL HIGH (ref 65–99)

## 2017-09-16 LAB — PROTIME-INR
INR: 2.04
Prothrombin Time: 22.9 seconds — ABNORMAL HIGH (ref 11.4–15.2)

## 2017-09-16 MED ORDER — MIRTAZAPINE 15 MG PO TBDP
15.0000 mg | ORAL_TABLET | Freq: Every day | ORAL | Status: DC
  Administered 2017-09-16: 15 mg via ORAL
  Filled 2017-09-16: qty 1

## 2017-09-16 MED ORDER — AMOXICILLIN-POT CLAVULANATE 875-125 MG PO TABS
1.0000 | ORAL_TABLET | Freq: Two times a day (BID) | ORAL | Status: DC
  Administered 2017-09-16 – 2017-09-18 (×3): 1 via ORAL
  Filled 2017-09-16 (×3): qty 1

## 2017-09-16 NOTE — Progress Notes (Signed)
PROGRESS NOTE  [name]. ZJQ:[id number] DOB: [birth year] DOA: 09/12/2017 PCP: [name], DO  HPI/Recap of past 24 hours:  Foley placed by urology on 12/14 + bm  He denies ab pain He seems more alert and interactive No cough, on room air, no fever  Per RN, patient took pills (crushed) ok, but he does not eat, he did not drink boost  No vomiting today.     Assessment/Plan: Principal Problem:   HCAP (healthcare-associated pneumonia) Active Problems:   Altered mental status   AKI (acute kidney injury) ([facility])   Type II diabetes mellitus with neurological manifestations (HCC)   Thrombocytosis (HCC)   Leukocytosis   Tachycardia   Patient is sent from SNF to ED due to altered mental status/dehydration Per chart review patient has ongoing active issues since October with decrease oral intake, intermittent n/v,  weight loss. Lipase and lft wnl. Initial x ray concerns for aspiration pneumonia and possible ileus. He is started on abx/ivf and admitted to the hospital  N/V Intermittent since October -Denies  abdominal pain. He does has constipation on x ray.  -Enema/stool softener , had bm on 12/15 -vomitedx1 on 12/15  Gallstone on x ray, lft wnl/ lipase wnl. No abdominal pain.   abdominal US  Multiple gallstones packing the gallbladder lumen. No sonographic evidence of acute cholecystitis" "There are 3 hypoechoic masses within the right lobe with the largest measuring 4.2 x 3.1 x 3.8 cm.   Liver massesx3: concerning for cancer, cea and afp in process. MRI liver ordered  H/o erosive esophagitis: continue ppi  Aspiration pneumonia? With tachycardia, leukocytosis and lactic acidosis on presentation -He was started on vanc/cefepime on admission. -mrsa screen negative.  vanc d/ed, change cefepime to unasyn then augmentin -Swallow eval recommend soft diet, thin liquid, continue ivf.  Sinus tachycardia: iv lopressor, ddimer elevated, pretest probability is high for  DVT/PE due to baseline immobile.  -Venous doppler +acute DVT involving a very small segment of the distal Femoral vein left leg. -Echo no right heart strain , lvef wnl, troponin negative.  -continue heparin drip for confirmed DVT and  presumed P - CTA chest  once cr improves, cxr with possible pneumonia, V/Q scan may be suboptimal.  - tsh 2.9  AKI onc CKD II/Urinary retention: -bladder scan with 543 initially on presentation,  psa 3.2,  -urinary retention likely from neurogenic bladder /constipation -ua no infection,  -Difficult foley placement, urology consulted, foley placed by urology on 12/14, Treat constipation, trial of flomax  -cr improving, Continue hydration, Repeat bmp in am, renal dosing meds   Hypokalemia; replace k,  Mag 2.5   HTN: now borderline bp, hold home meds norvasc,  On betablocker, Continue hydration.  Insulin dependent dm2 -a1c 10.5 -Patient does not take oral consistantly, on ssi and hypoglycemia protocol.   H/o CVA/seizure/dementia/dysphagia Oriented to person, know he is in the hospital. States it is June. On iv keppra when lethargic, now more awake, change back to oral keppra, aspiration precaution.   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 Daughter reports patient was discharged to [facility] after hospitalization in 05/2017, then  he was sent to [facility], patient now is a long term resident at [facility] for 1-2 months due to it was determined that he was not able to participate rehab. Daughter has not seem patient for a few months but  reports patient has been progressive decline and recent significant weight loss.  Per chart review, patient has been sent to ED multiple times recently due to not eating and dehydration.  Daughter reports patient declined feeding tube placement in the past. Daughter reports patient is a full code. I have discussed with daughter that the progressive decline likely will be ongoing and less likely reversible. I have asked if they want to meet with palliative care, daughter states she will discuss with the rest of the family. Patient is full code for now.    Per RN patient does not eat, will start remeron, get calorie count.  Very poor prognosis.     Code Status: full  Family Communication: daughter over the phone on 12/13, son at bedside on 12/15  Disposition Plan: return to snf once mri liver done, calorie count completed   Consultants:  Urology  Palliative care  Procedures:  Foley placement  Antibiotics:  Vanc/cefepime from admission to 12/13  unasyn from 12/13 to 12/15  augmentin from 12/15   Objective: BP 98/66 (BP Location: Left Arm)   Pulse 88   Temp 98.2 F (36.8 C) (Oral)   Resp 20   SpO2 92%   Intake/Output Summary (Last 24 hours) at 09/16/2017 0817 Last data filed at 09/16/2017 0530 Gross per 24 hour  Intake 1546.25 ml  Output 600 ml  Net 946.25 ml   There were no vitals filed for this visit.  Exam: Patient is examined daily including today on 09/16/2017, exams remain the same as of yesterday except that has changed    General:  Thin, frail, cachectic , seems improving, he knows it is December today, while he has been saying June for several days initially, knows he is in the hospital, follow commands, dysarthria ( likely baseline), bilateral upper extremity contracture, left worse than right  Cardiovascular: sinus tachcycardia has improved  Respiratory: diminished at basis, no wheezing, no rales, no rhonchi  Abdomen: Soft/ND/NT, positive  BS  Musculoskeletal: No Edema,  able to lift legs against gravity today, but not able to lift against resistance  Neuro: more alert, oriented to place and person, chronic upper extremity contractures  Data Reviewed: Basic Metabolic Panel: Recent Labs  Lab 09/12/17 1631 09/13/17 0410 09/14/17 0611 09/15/17 0625 09/16/17 0621  NA 135 137 136 140 139  K 3.7 3.1* 4.1 3.9 4.0  CL 96* 103 106 110 111  CO2 26 25 21* 21* 19*  GLUCOSE 293* 95 125* 106* 145*  BUN 66* 54* 47* 41* 31*  CREATININE 1.68* 1.32* 1.36* 1.25* 1.07  CALCIUM 9.7 8.9 8.8* 8.6* 8.6*  MG  --   --  2.5*  --  2.0   Liver Function Tests: Recent Labs  Lab 09/12/17 09/12/17 1631 09/13/17 0410 09/15/17 0625  AST 14 20 20 21   ALT 5* 11* 9* 9*  ALKPHOS 112 99 82 83  BILITOT  --  0.5 0.5 0.5  PROT  --  7.6 6.1* 5.8*  ALBUMIN  --  2.7* 2.2* 2.2*   Recent Labs  Lab 09/15/17 0625  LIPASE 12   Recent Labs  Lab 09/15/17 0625  AMMONIA 29   CBC: Recent Labs  Lab 09/12/17 09/12/17 1631 09/14/17 0611 09/15/17 0625  WBC 14.5 15.6*  15.7* 16.5*  NEUTROABS 12 13.3* 12.4*  --   HGB 10.1* 11.7* 9.1* 9.5*  HCT 33* 38.4* 30.5* 32.3*  MCV  --  71.9* 72.3* 73.6*  PLT 638* 561* 511* 518*   Cardiac Enzymes:   Recent Labs  Lab 09/13/17 0010 09/13/17 0410 09/13/17 0636  TROPONINI <0.03 <0.03 <0.03   BNP (last 3 results) No results for input(s): BNP in the last 8760 hours.  ProBNP (last 3 results) No results for input(s): PROBNP in the last 8760 hours.  CBG: Recent Labs  Lab 09/15/17 1658 09/15/17 2028 09/16/17 0004 09/16/17 0507 09/16/17 0750  GLUCAP 106* 134* 144* 137* 147*    Recent Results (from the past 240 hour(s))  Blood culture (routine x 2)     Status: None (Preliminary result)   Collection Time: 09/12/17  5:20 PM  Result Value Ref Range Status   Specimen Description BLOOD RIGHT ANTECUBITAL  Final   Special Requests   Final    BOTTLES DRAWN AEROBIC AND ANAEROBIC Blood Culture adequate  volume   Culture   Final    NO GROWTH 3 DAYS Performed at [facility], [place] [address], [place], [place] [address]    Report Status PENDING  Incomplete  Urine culture     Status: None   Collection Time: 09/12/17  5:42 PM  Result Value Ref Range Status   Specimen Description URINE, CATHETERIZED  Final   Special Requests NONE  Final   Culture   Final    NO GROWTH Performed at [facility], [address] [place], [place] [address]    Report Status 09/14/2017 FINAL  Final  Respiratory Panel by PCR     Status: None   Collection Time: 09/12/17  9:47 PM  Result Value Ref Range Status   Adenovirus NOT DETECTED NOT DETECTED Final   Coronavirus 229E NOT DETECTED NOT DETECTED Final   Coronavirus HKU1 NOT DETECTED NOT DETECTED Final   Coronavirus NL63 NOT DETECTED NOT DETECTED Final   Coronavirus OC43 NOT DETECTED NOT DETECTED Final   Metapneumovirus NOT DETECTED NOT DETECTED Final   Rhinovirus / Enterovirus NOT DETECTED NOT DETECTED Final   Influenza A NOT DETECTED NOT DETECTED Final   Influenza B NOT DETECTED NOT DETECTED Final   Parainfluenza Virus 1 NOT DETECTED NOT DETECTED Final   Parainfluenza Virus 2 NOT DETECTED NOT DETECTED Final   Parainfluenza Virus 3 NOT DETECTED NOT DETECTED Final   Parainfluenza Virus 4 NOT DETECTED NOT DETECTED Final   Respiratory Syncytial Virus NOT DETECTED NOT DETECTED Final   Bordetella pertussis NOT DETECTED NOT DETECTED Final   Chlamydophila pneumoniae NOT DETECTED NOT DETECTED Final   Mycoplasma pneumoniae NOT DETECTED NOT DETECTED Final    Comment: Performed at [facility], [place] [address], [place], [place] [address]  MRSA PCR Screening     Status: None   Collection Time: 09/12/17  9:47 PM  Result Value Ref Range Status   MRSA by PCR NEGATIVE NEGATIVE Final    Comment:        The GeneXpert MRSA Assay (FDA approved for NASAL specimens only), is one component of a comprehensive MRSA colonization surveillance program. It is  not intended to diagnose MRSA infection nor to guide or monitor treatment for MRSA infections.   Culture, blood (routine x 2) Call MD if unable to obtain prior to antibiotics being given     Status: None (Preliminary result)   Collection Time: 09/12/17 11:07 PM  Result Value Ref  Range Status   Specimen Description BLOOD RIGHT ANTECUBITAL  Final   Special Requests IN PEDIATRIC BOTTLE Blood Culture adequate volume  Final   Culture   Final    NO GROWTH 2 DAYS Performed at [facility], [address], [place], [place] [address]    Report Status PENDING  Incomplete     Studies: No results found.  Scheduled Meds: . amoxicillin-clavulanate  1 tablet Oral BID  . ARIPiprazole  2 mg Oral Daily  . aspirin EC  81 mg Oral Daily  . atorvastatin  80 mg Oral q1800  . cholecalciferol  2,000 Units Oral Daily  . enoxaparin (LOVENOX) injection  60 mg Subcutaneous Q12H  . feeding supplement  1 Container Oral BID BM  . folic acid  1 mg Oral Daily  . insulin aspart  0-9 Units Subcutaneous Q4H  . levETIRAcetam  500 mg Oral BID  . lipase/protease/amylase  12,000 Units Oral TID WC  . metoprolol tartrate  12.5 mg Oral BID  . pantoprazole (PROTONIX) IV  40 mg Intravenous Q12H  . polyethylene glycol  17 g Oral Daily  . senna-docusate  1 tablet Oral BID  . sertraline  50 mg Oral Daily  . tamsulosin  0.4 mg Oral Daily    Continuous Infusions: . lactated ringers 75 mL/hr at 09/15/17 2302     Time spent: 38mins I have personally reviewed and interpreted on  09/16/2017 daily labs, tele strips, imagings as discussed above under date review session and assessment and plans.  I reviewed all nursing notes, pharmacy notes, consult note, vitals, pertinent old records  I have discussed plan of care as described above with RN , patient  on 09/16/2017   Florencia Reasons MD, PhD  [facility] Pager [phone]. If 7PM-7AM, please contact night-coverage at [email or web address], password [facility] 09/16/2017, 8:17  AM  LOS: 4 days

## 2017-09-16 NOTE — Progress Notes (Addendum)
Pharmacy Antibiotic Note  [name]. is a 76 y.o. male presented to the ED on 09/16/17 with AMS.  Cefepime started on admission for PNA and subsequently changed to unasyn on 12/13 for suspected asp PNA.  Today, 09/16/2017: - day #4 abx - afeb,  wbc 16.5 on 12/15 - scr trending down to 1.07 (crcl~46) - all cultures have been negative thus far  Plan: -  unasyn 1.5 gm IV q8h --- changed to augmentin on 12/15 per MD - With unasyn d/ced. Pharmacy will sign off.  Reconsult [place] if need further assistance. ______________________________________     Temp (24hrs), Avg:98.2 F (36.8 C), Min:97.9 F (36.6 C), Max:98.5 F (36.9 C)  Recent Labs  Lab 09/12/17 09/12/17 1631 09/12/17 1651 09/12/17 2046 09/13/17 0410 09/14/17 0611 09/14/17 0623 09/15/17 0625 09/16/17 0621  WBC 14.5 15.6*  --   --   --  15.7*  --  16.5*  --   CREATININE 1.5* 1.68*  --   --  1.32* 1.36*  --  1.25* 1.07  LATICACIDVEN  --   --  2.59* 2.31*  --   --  1.1  --   --     Estimated Creatinine Clearance: 46.4 mL/min (by C-G formula based on SCr of 1.07 mg/dL).    Allergies  Allergen Reactions  . Dilantin [Phenytoin Sodium Extended] Other (See Comments)    Developed Complete Heart Block following 1 dose   Antimicrobials this admission:  12/12 cefepime >> 12/13 12/13 Unasyn >> 12/15 12/15 augmentin>>  Dose adjustments this admission:  ---  Microbiology results:  12/12 at 1720 BCx x1: 12/12 at 2307 bcx x1:  12/12 UCx: NGF  12/12 MRSA PCR: negative 12/12 Resp panel PCR: negative 12/12: Legionella : IP  12/13: HIV antibody: negative  12/13: Strep pneumo antigen: negative  Thank you for allowing pharmacy to be a part of this patient's care.  Lynelle Doctor 09/16/2017 12:35 PM

## 2017-09-16 NOTE — Progress Notes (Signed)
ANTICOAGULATION CONSULT NOTE - Follow Up Consult  Pharmacy Consult for Heparin --> lovenox Indication: suspected pulmonary embolus; DVT  Allergies  Allergen Reactions  . Dilantin [Phenytoin Sodium Extended] Other (See Comments)    Developed Complete Heart Block following 1 dose    Patient Measurements: Height: 69 inches Weight: 56.8kg Heparin Dosing Weight: 56.8kg  Vital Signs: Temp: 98.2 F (36.8 C) (12/16 0511) Temp Source: Oral (12/16 0511) BP: 98/66 (12/16 0511) Pulse Rate: 88 (12/16 0511)  Labs: Recent Labs    09/14/17 1287 09/14/17 8676 09/14/17 1721 09/15/17 0603 09/15/17 0604 09/15/17 0625 09/16/17 0621  HGB 9.1*  --   --   --   --  9.5*  --   HCT 30.5*  --   --   --   --  32.3*  --   PLT 511*  --   --   --   --  518*  --   LABPROT  --   --   --  27.8*  --   --  22.9*  INR  --   --   --  2.62  --   --  2.04  HEPARINUNFRC  --  0.93* 0.18*  --  <0.10*  --   --   CREATININE 1.36*  --   --   --   --  1.25* 1.07    Estimated Creatinine Clearance: 46.4 mL/min (by C-G formula based on SCr of 1.07 mg/dL).   Medications:  Scheduled:  . amoxicillin-clavulanate  1 tablet Oral BID  . ARIPiprazole  2 mg Oral Daily  . aspirin EC  81 mg Oral Daily  . atorvastatin  80 mg Oral q1800  . cholecalciferol  2,000 Units Oral Daily  . enoxaparin (LOVENOX) injection  60 mg Subcutaneous Q12H  . feeding supplement  1 Container Oral BID BM  . folic acid  1 mg Oral Daily  . insulin aspart  0-9 Units Subcutaneous Q4H  . levETIRAcetam  500 mg Oral BID  . lipase/protease/amylase  12,000 Units Oral TID WC  . metoprolol tartrate  12.5 mg Oral BID  . pantoprazole (PROTONIX) IV  40 mg Intravenous Q12H  . polyethylene glycol  17 g Oral Daily  . senna-docusate  1 tablet Oral BID  . sertraline  50 mg Oral Daily  . tamsulosin  0.4 mg Oral Daily   Infusions:  . lactated ringers 75 mL/hr at 09/15/17 2302   PRN: alum & mag hydroxide-simeth, metoprolol tartrate,  ondansetron  Assessment: 76 y.o M presented to the ED on 09/12/17 with AMS and liver masses.  D-Dimer was found to be elevated at 6.91 on 12/13.  Plan for chest CTA when renal function improves.  Heparin started on 12/13 for r/o PE.  Significant events: - 12/14 LE doppler: RLE neg for DVT; LLE "exhibits isolated age indeterminate deep vein thrombosis involving a small segment of the distal left femoral vein" - 12/16 PM: pt loss IV access.  Transitioned from heparin drip to lovenox  Today, 09/14/17 - cbc relatively stable with last drawn on 12/15 - no bleeding documented - scr down 1.07 (crcl~46) - INR elevated at 2.04 (pt's not on warfarin or DOAC, LFTs wnl; related to liver masses?)   Goal of Therapy:  Heparin level 0.3-0.7 units/ml Monitor platelets by anticoagulation protocol: Yes   Plan:  - continue lovenox 60 mg SQ q12h - cbc q72h - monitor for s/s bleeding - consider consulting heme/onc for recom re: maintenance anticoagulation d/t liver masses and elevated INR  Dia Sitter, PharmD, BCPS 09/16/2017 12:25 PM

## 2017-09-17 ENCOUNTER — Inpatient Hospital Stay (HOSPITAL_COMMUNITY): Payer: Medicare Other

## 2017-09-17 LAB — CBC WITH DIFFERENTIAL/PLATELET
BASOS ABS: 0 10*3/uL (ref 0.0–0.1)
Basophils Relative: 0 %
EOS ABS: 0 10*3/uL (ref 0.0–0.7)
Eosinophils Relative: 0 %
HEMATOCRIT: 30.5 % — AB (ref 39.0–52.0)
HEMOGLOBIN: 9 g/dL — AB (ref 13.0–17.0)
LYMPHS PCT: 9 %
Lymphs Abs: 1.3 10*3/uL (ref 0.7–4.0)
MCH: 21.6 pg — ABNORMAL LOW (ref 26.0–34.0)
MCHC: 29.5 g/dL — AB (ref 30.0–36.0)
MCV: 73.1 fL — ABNORMAL LOW (ref 78.0–100.0)
MONOS PCT: 7 %
Monocytes Absolute: 1 10*3/uL (ref 0.1–1.0)
NEUTROS ABS: 12.2 10*3/uL — AB (ref 1.7–7.7)
NEUTROS PCT: 84 %
Platelets: 500 10*3/uL — ABNORMAL HIGH (ref 150–400)
RBC: 4.17 MIL/uL — ABNORMAL LOW (ref 4.22–5.81)
RDW: 20.8 % — ABNORMAL HIGH (ref 11.5–15.5)
WBC: 14.5 10*3/uL — ABNORMAL HIGH (ref 4.0–10.5)

## 2017-09-17 LAB — GLUCOSE, CAPILLARY
GLUCOSE-CAPILLARY: 125 mg/dL — AB (ref 65–99)
GLUCOSE-CAPILLARY: 135 mg/dL — AB (ref 65–99)
GLUCOSE-CAPILLARY: 150 mg/dL — AB (ref 65–99)
Glucose-Capillary: 127 mg/dL — ABNORMAL HIGH (ref 65–99)
Glucose-Capillary: 120 mg/dL — ABNORMAL HIGH (ref 65–99)
Glucose-Capillary: 134 mg/dL — ABNORMAL HIGH (ref 65–99)
Glucose-Capillary: 89 mg/dL (ref 65–99)

## 2017-09-17 LAB — BASIC METABOLIC PANEL
ANION GAP: 8 (ref 5–15)
BUN: 30 mg/dL — ABNORMAL HIGH (ref 6–20)
CO2: 19 mmol/L — AB (ref 22–32)
Calcium: 8.9 mg/dL (ref 8.9–10.3)
Chloride: 112 mmol/L — ABNORMAL HIGH (ref 101–111)
Creatinine, Ser: 1.12 mg/dL (ref 0.61–1.24)
GLUCOSE: 154 mg/dL — AB (ref 65–99)
POTASSIUM: 4.2 mmol/L (ref 3.5–5.1)
Sodium: 139 mmol/L (ref 135–145)

## 2017-09-17 LAB — CULTURE, BLOOD (ROUTINE X 2)
Culture: NO GROWTH
SPECIAL REQUESTS: ADEQUATE

## 2017-09-17 LAB — CORTISOL: CORTISOL PLASMA: 21.3 ug/dL

## 2017-09-17 MED ORDER — ENOXAPARIN SODIUM 60 MG/0.6ML ~~LOC~~ SOLN
60.0000 mg | Freq: Two times a day (BID) | SUBCUTANEOUS | Status: DC
  Administered 2017-09-18 – 2017-09-20 (×4): 60 mg via SUBCUTANEOUS
  Filled 2017-09-17 (×4): qty 0.6

## 2017-09-17 MED ORDER — IOPAMIDOL (ISOVUE-300) INJECTION 61%: 15.0000 mL | Freq: Once | INTRAVENOUS | Status: AC | PRN

## 2017-09-17 MED ORDER — ENSURE ENLIVE PO LIQD
237.0000 mL | Freq: Two times a day (BID) | ORAL | Status: DC
  Administered 2017-09-18 – 2017-09-22 (×3): 237 mL via ORAL

## 2017-09-17 MED ORDER — IOPAMIDOL (ISOVUE-370) INJECTION 76%
INTRAVENOUS | Status: AC
  Filled 2017-09-17: qty 100

## 2017-09-17 MED ORDER — LACTATED RINGERS IV BOLUS (SEPSIS)
1000.0000 mL | Freq: Once | INTRAVENOUS | Status: AC
  Administered 2017-09-17: 1000 mL via INTRAVENOUS

## 2017-09-17 MED ORDER — METOPROLOL TARTRATE 5 MG/5ML IV SOLN
5.0000 mg | INTRAVENOUS | Status: AC | PRN
  Administered 2017-09-17 (×2): 5 mg via INTRAVENOUS
  Filled 2017-09-17 (×2): qty 5

## 2017-09-17 MED ORDER — PANTOPRAZOLE SODIUM 40 MG PO TBEC
40.0000 mg | DELAYED_RELEASE_TABLET | Freq: Two times a day (BID) | ORAL | Status: DC
  Administered 2017-09-18 – 2017-09-22 (×8): 40 mg via ORAL
  Filled 2017-09-17 (×8): qty 1

## 2017-09-17 MED ORDER — METOPROLOL TARTRATE 5 MG/5ML IV SOLN
5.0000 mg | INTRAVENOUS | Status: DC
  Administered 2017-09-17 – 2017-09-23 (×24): 5 mg via INTRAVENOUS
  Filled 2017-09-17 (×27): qty 5

## 2017-09-17 MED ORDER — IOPAMIDOL (ISOVUE-370) INJECTION 76%
INTRAVENOUS | Status: AC
  Administered 2017-09-17: 100 mL via INTRAVENOUS
  Filled 2017-09-17: qty 100

## 2017-09-17 NOTE — Progress Notes (Addendum)
Per Dr. Kathlene Cote (Radiology), okay to give tonight's dose of enoxaparin, then hold 09/18/2017 0600 dose in anticipation of IR procedures.   Lindell Spar, PharmD, BCPS Pager: [phone] 09/17/2017 6:46 PM

## 2017-09-17 NOTE — Progress Notes (Signed)
Pt currently still has some mild confusion. For a MRI of the liver to be diagnostically obtained. Pt will need to be more alert and oriented. Pt will need to be able to follow commands. Will check back tomorrow to see if pt's confusion has resolved some. Spoke with Dr Erlinda Hong about this.

## 2017-09-17 NOTE — Progress Notes (Signed)
The patient is receiving Protonix by the intravenous route.  Based on criteria approved by the Pharmacy and [place], the medication is being converted to the equivalent oral dose form.  These criteria include: -No active GI bleeding -Able to tolerate diet of full liquids (or better) or tube feeding -Able to tolerate other medications by the oral or enteral route  If you have any questions about this conversion, please contact the Pharmacy Department (phone [phone]).  Thank you. Eudelia Bunch, Pharm.D. [phone] 09/17/2017 9:40 AM

## 2017-09-17 NOTE — Progress Notes (Signed)
Patient evaluated for IV infiltration during CT scan this afternoon.  He has some edema noted just inferior to his IV site.  There is no evidence of compartment syndrome, skin breakdown, etc.  He has a radial pulse.  The patient does not answer my questions and clearly has an AMS.  He is unable to tell me if he has pain.  Continue with elevation and ice per protocol.  IR also aware of request for para, thora, and liver bx.  MD is in a case right now, but will run bx request by him when available.  Given the amount of ascites present on his scan, it is likely, IR MD may recommend starting with a paracentesis/thoracentesis and sending for cytology prior to a more invasive procedure such as a biopsy, especially given the patient's current state of health.  Palliative care consult is pending.  Henreitta Cea 3:29 PM 09/17/2017

## 2017-09-17 NOTE — Progress Notes (Signed)
Date: September 17, 2017 [name], BSN, RN3, CCM [phone] Chart and notes review for patient progress and needs. Will follow for case management and discharge needs. Next review date: [id number] 

## 2017-09-17 NOTE — Progress Notes (Signed)
PT Cancellation Note  Patient Details Name: [name]. MRN: [id number] DOB: 08/09/1941   Cancelled Treatment:    Reason Eval/Treat Not Completed: Medical issues which prohibited therapy.  Lethargic and still quite weak, will re-attempt tomorrow.   [name] 09/17/2017, 2:36 PM   [name], PT MS Acute Rehab Dept. Number: [place]

## 2017-09-17 NOTE — Progress Notes (Signed)
Calorie Count Note  48 hour calorie count ordered per MD. Day 1 results below. Results based on PO intakes documented in flowsheets. No envelope on door.  Diet: Soft diet, CHO modified Supplements: Boost Breeze po TID, each supplement provides 250 kcal and 9 grams of protein  Day 1, 12/16: Breakfast: 80 kcal, 1g protein Lunch: 100 kcal, 2g protein Dinner: 110 kcal, 2g protein Supplements: 250 kcal, 9g protein  Estimated Nutritional Needs:  Kcal:  1800-2000 kcal/day Protein:  85-95 g/day  Total intake: 540 kcal (30% of minimum estimated needs)  14g protein (16% of minimum estimated needs)  Nutrition Dx: Severe Malnutrition related to chronic illness(dementia) as evidenced by severe fat depletion, severe muscle depletion.  Goal: Pt to meet >/= 90% of their estimated nutrition needs   Intervention:  -d/c Boost Breeze -Provide Ensure Enlive po BID, each supplement provides 350 kcal and 20 grams of protein -Changed diet to Dysphagia 3 diet with ground meats and thin liquids per SLP recommendations -continue Calorie Count -placed instructions in order  Clayton Bibles, MS, RD, [place] Pager: [phone] After Hours Pager: [phone]

## 2017-09-17 NOTE — Progress Notes (Signed)
Patient HR in the 130's. Gave PRN dose of IV lopressor 5mg . MD paged and made aware. HR unchanged from Lopressor when checked. MD paged and at the bedside at this time

## 2017-09-17 NOTE — Progress Notes (Addendum)
PROGRESS NOTE  [name] OFB:[id number] DOB: 1941-04-21 DOA: 09/12/2017 PCP: [name], DO  HPI/Recap of past 24 hours:  Foley placed by urology on 12/14 + bm  He denies ab pain but vomited this morning again, he is more lethargic this am, he has tachycardia He has not been eating,   No cough, on room air, no fever     Assessment/Plan: Principal Problem:   HCAP (healthcare-associated pneumonia) Active Problems:   Altered mental status   AKI (acute kidney injury) ([place])   Type II diabetes mellitus with neurological manifestations ([place])   Thrombocytosis ([place])   Leukocytosis   Tachycardia   Patient is sent from SNF to ED due to altered mental status/dehydration Per chart review patient has ongoing active issues since October with decrease oral intake, intermittent n/v,  weight loss. Lipase and lft wnl. Initial x ray concerns for aspiration pneumonia and possible ileus. He is started on abx/ivf and admitted to the hospital  N/V Intermittent since October -Denies  abdominal pain. He does has constipation on x ray.  -Enema/stool softener , had bm on 12/15 -vomitedx1 on 12/15, vomited n 12/17 -ct ab/pel pending,  Gallstone on x ray, lft wnl/ lipase wnl. No abdominal pain.   abdominal US  Multiple gallstones packing the gallbladder lumen. No sonographic evidence of acute cholecystitis" "There are 3 hypoechoic masses within the right lobe with the largest measuring 4.2 x 3.1 x 3.8 cm.   Liver massesx3: concerning for cancer, cea and afp in process. MRI liver ordered, but patient is not able to follow command to MRI, will attempt mri liver if patient mental status improves.  H/o erosive esophagitis: continue ppi , he denies pain. He does not eat much.  Aspiration pneumonia? With tachycardia, leukocytosis and lactic acidosis on presentation -He was started on vanc/cefepime on admission. -mrsa screen negative.  vanc d/ed, change cefepime to unasyn then  augmentin -Swallow eval recommend soft diet, thin liquid, continue ivf.  Sinus tachycardia/atrial tachycardia: iv lopressor, ddimer elevated, pretest probability is high for DVT/PE due to baseline immobile.  -Venous doppler +acute DVT involving a very small segment of the distal Femoral vein left leg. -Echo no right heart strain , lvef wnl, troponin negative.  -cxr with possible pneumonia, V/Q scan may be suboptimal.  - CTA chest on 12/17, now cr has over all improved. - tsh 2.9  AKI onc CKD II/Urinary retention: -bladder scan with 543 initially on presentation,  psa 3.2,  -urinary retention likely from neurogenic bladder /constipation -ua no infection,  -Difficult foley placement, urology consulted, foley placed by urology on 12/14, Treat constipation, trial of flomax  -cr improving, Continue hydration, Repeat bmp in am, renal dosing meds   Hypokalemia; replace k,  Mag 2.5   HTN: now borderline bp, hold home meds norvasc,  On betablocker, Continue hydration.  Insulin dependent dm2 -a1c 10.5 -Patient does not take oral consistantly, on ssi and hypoglycemia protocol.   H/o CVA/seizure/dementia/dysphagia Oriented to person, know he is in the hospital. States it is June. On iv keppra when lethargic, now more awake, change back to oral keppra, aspiration precaution.   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 Daughter reports patient was discharged to [facility] after hospitalization in 05/2017, then he was sent to [facility], patient now is a long term resident at  [facility] for 1-2 months due to it was determined that he was not able to participate rehab. Daughter has not seem patient for a few months but reports patient has been progressive decline and recent significant weight loss.  Per chart review, patient has been sent to ED multiple times recently due to not eating and dehydration.  Daughter reports patient declined feeding tube placement in the past. Daughter reports patient is a full code. I have discussed with daughter that the progressive decline likely will be ongoing and less likely reversible. I have asked if they want to meet with palliative care, daughter states she will discuss with the rest of the family. Patient is full code for now.    Per RN patient does not eat, will start remeron, get calorie count.  Very poor prognosis.   3pm addendum on 12/17: called by Radiology Dr Enriqueta Shutter who report multiple concerning findings on CT scan High concerns for metastatic cancer ( multiple liver lesions on both lobes), possible omentum involvement with malignant ascites, large right pleural effusion, + segmental PE, + debris in bronchus, bone mottled appearance. Detail please refer to original reports. Dr Enriqueta Shutter recommended liver biopsy, IR consulted. Daughter Kalman Shan updated over the phone.  Pharmacy to coordinate with OR for holding anticoagulation for procedure.  Code Status: full  Family Communication: daughter over the phone on 12/13, son at bedside on 12/15  Disposition Plan: return to snf once medically stable   Consultants:  Urology  Palliative care  Procedures:  Foley placement  Antibiotics:  Vanc/cefepime from admission to 12/13  unasyn from 12/13 to 12/15  augmentin from 12/15   Objective: BP 125/82 (BP Location: Left Arm)   Pulse 88   Temp 97.9 F (36.6 C) (Oral)   Resp 18   SpO2 98%   Intake/Output Summary (Last 24 hours) at 09/17/2017 0814 Last data filed at 09/17/2017 0600 Gross per 24 hour  Intake 2447.5  ml  Output 350 ml  Net 2097.5 ml   There were no vitals filed for this visit.  Exam: Patient is examined daily including today on 09/17/2017, exams remain the same as of yesterday except that has changed    General:  Thin, frail, cachectic , seems improving, he knows it is December today,  knows he is in the hospital, follow commands, dysarthria ( likely baseline), bilateral upper extremity contracture, left worse than right  Cardiovascular: intermittent tachcycardia   Respiratory: diminished at basis, no wheezing, no rales, no rhonchi  Abdomen: Soft/ND/NT, positive BS  Musculoskeletal: No Edema,  able to lift legs against gravity today, but not able to lift against resistance  Neuro: more alert, oriented to place and person, chronic upper extremity contractures  Data Reviewed: Basic Metabolic Panel: Recent Labs  Lab 09/13/17 0410 09/14/17 0611 09/15/17 0625 09/16/17 0621 09/17/17 0538  NA 137 136 140 139 139  K 3.1* 4.1 3.9 4.0 4.2  CL 103 106 110 111 112*  CO2 25 21* 21* 19* 19*  GLUCOSE 95 125* 106* 145* 154*  BUN 54* 47* 41* 31* 30*  CREATININE 1.32* 1.36* 1.25* 1.07 1.12  CALCIUM 8.9 8.8* 8.6* 8.6* 8.9  MG  --  2.5*  --  2.0  --    Liver Function Tests: Recent Labs  Lab 09/12/17 09/12/17 1631  09/13/17 0410 09/15/17 0625  AST 14 20 20 21   ALT 5* 11* 9* 9*  ALKPHOS 112 99 82 83  BILITOT  --  0.5 0.5 0.5  PROT  --  7.6 6.1* 5.8*  ALBUMIN  --  2.7* 2.2* 2.2*   Recent Labs  Lab 09/15/17 0625  LIPASE 12   Recent Labs  Lab 09/15/17 0625  AMMONIA 29   CBC: Recent Labs  Lab 09/12/17 09/12/17 1631 09/14/17 0611 09/15/17 0625 09/17/17 0538  WBC 14.5 15.6* 15.7* 16.5* 14.5*  NEUTROABS 12 13.3* 12.4*  --  12.2*  HGB 10.1* 11.7* 9.1* 9.5* 9.0*  HCT 33* 38.4* 30.5* 32.3* 30.5*  MCV  --  71.9* 72.3* 73.6* 73.1*  PLT 638* 561* 511* 518* 500*   Cardiac Enzymes:   Recent Labs  Lab 09/13/17 0010 09/13/17 0410 09/13/17 0636  TROPONINI <0.03 <0.03  <0.03   BNP (last 3 results) No results for input(s): BNP in the last 8760 hours.  ProBNP (last 3 results) No results for input(s): PROBNP in the last 8760 hours.  CBG: Recent Labs  Lab 09/16/17 1608 09/16/17 2015 09/17/17 0012 09/17/17 0449 09/17/17 0717  GLUCAP 149* 129* 135* 150* 127*    Recent Results (from the past 240 hour(s))  Blood culture (routine x 2)     Status: None (Preliminary result)   Collection Time: 09/12/17  5:20 PM  Result Value Ref Range Status   Specimen Description BLOOD RIGHT ANTECUBITAL  Final   Special Requests   Final    BOTTLES DRAWN AEROBIC AND ANAEROBIC Blood Culture adequate volume   Culture   Final    NO GROWTH 4 DAYS Performed at [facility], [place] [address], [place], [place] [address]    Report Status PENDING  Incomplete  Urine culture     Status: None   Collection Time: 09/12/17  5:42 PM  Result Value Ref Range Status   Specimen Description URINE, CATHETERIZED  Final   Special Requests NONE  Final   Culture   Final    NO GROWTH Performed at [facility], [address], [place], [place] [address]    Report Status 09/14/2017 FINAL  Final  Respiratory Panel by PCR     Status: None   Collection Time: 09/12/17  9:47 PM  Result Value Ref Range Status   Adenovirus NOT DETECTED NOT DETECTED Final   Coronavirus 229E NOT DETECTED NOT DETECTED Final   Coronavirus HKU1 NOT DETECTED NOT DETECTED Final   Coronavirus NL63 NOT DETECTED NOT DETECTED Final   Coronavirus OC43 NOT DETECTED NOT DETECTED Final   Metapneumovirus NOT DETECTED NOT DETECTED Final   Rhinovirus / Enterovirus NOT DETECTED NOT DETECTED Final   Influenza A NOT DETECTED NOT DETECTED Final   Influenza B NOT DETECTED NOT DETECTED Final   Parainfluenza Virus 1 NOT DETECTED NOT DETECTED Final   Parainfluenza Virus 2 NOT DETECTED NOT DETECTED Final   Parainfluenza Virus 3 NOT DETECTED NOT DETECTED Final   Parainfluenza Virus 4 NOT DETECTED NOT DETECTED Final    Respiratory Syncytial Virus NOT DETECTED NOT DETECTED Final   Bordetella pertussis NOT DETECTED NOT DETECTED Final   Chlamydophila pneumoniae NOT DETECTED NOT DETECTED Final   Mycoplasma pneumoniae NOT DETECTED NOT DETECTED Final    Comment: Performed at [facility], [place] [address] [place], [place] [address]  MRSA PCR Screening     Status: None   Collection Time: 09/12/17  9:47 PM  Result Value Ref Range  Status   MRSA by PCR NEGATIVE NEGATIVE Final    Comment:        The GeneXpert MRSA Assay (FDA approved for NASAL specimens only), is one component of a comprehensive MRSA colonization surveillance program. It is not intended to diagnose MRSA infection nor to guide or monitor treatment for MRSA infections.   Culture, blood (routine x 2) Call MD if unable to obtain prior to antibiotics being given     Status: None (Preliminary result)   Collection Time: 09/12/17 11:07 PM  Result Value Ref Range Status   Specimen Description BLOOD RIGHT ANTECUBITAL  Final   Special Requests IN PEDIATRIC BOTTLE Blood Culture adequate volume  Final   Culture   Final    NO GROWTH 3 DAYS Performed at [facility], [address], [place], [place] [address]    Report Status PENDING  Incomplete     Studies: No results found.  Scheduled Meds: . amoxicillin-clavulanate  1 tablet Oral Q12H  . ARIPiprazole  2 mg Oral Daily  . aspirin EC  81 mg Oral Daily  . atorvastatin  80 mg Oral q1800  . cholecalciferol  2,000 Units Oral Daily  . enoxaparin (LOVENOX) injection  60 mg Subcutaneous Q12H  . feeding supplement  1 Container Oral BID BM  . folic acid  1 mg Oral Daily  . insulin aspart  0-9 Units Subcutaneous Q4H  . levETIRAcetam  500 mg Oral BID  . lipase/protease/amylase  12,000 Units Oral TID WC  . metoprolol tartrate  12.5 mg Oral BID  . mirtazapine  15 mg Oral QHS  . pantoprazole (PROTONIX) IV  40 mg Intravenous Q12H  . polyethylene glycol  17 g Oral Daily  . senna-docusate  1  tablet Oral BID  . sertraline  50 mg Oral Daily  . tamsulosin  0.4 mg Oral Daily    Continuous Infusions: . lactated ringers 75 mL/hr at 09/17/17 0502     Time spent: 44mins I have personally reviewed and interpreted on  09/17/2017 daily labs, tele strips, imagings as discussed above under date review session and assessment and plans.  I reviewed all nursing notes, pharmacy notes, consult note, vitals, pertinent old records  I have discussed plan of care as described above with RN , patient  on 09/17/2017   Florencia Reasons MD, PhD  [facility] Pager [phone]. If 7PM-7AM, please contact night-coverage at [email or web address], password [facility] 09/17/2017, 8:14 AM  LOS: 5 days

## 2017-09-17 NOTE — Progress Notes (Signed)
PT Cancellation Note  Patient Details Name: [name]. MRN: [id number] DOB: [birth year]   Cancelled Treatment:    Reason Eval/Treat Not Completed: Medical issues which prohibited therapy.  Pt is having a line placed with [place] when PT arrived and is very tachy this AM.  Ck later to see if pt is more able to tolerate therapy.   Ramond Dial 09/17/2017, 10:45 AM   Mee Hives, PT MS Acute Rehab Dept. Number: [place] and [place]

## 2017-09-18 ENCOUNTER — Inpatient Hospital Stay (HOSPITAL_COMMUNITY): Payer: Medicare Other

## 2017-09-18 LAB — BASIC METABOLIC PANEL
ANION GAP: 6 (ref 5–15)
BUN: 28 mg/dL — AB (ref 6–20)
CALCIUM: 8.9 mg/dL (ref 8.9–10.3)
CO2: 19 mmol/L — ABNORMAL LOW (ref 22–32)
CREATININE: 1.17 mg/dL (ref 0.61–1.24)
Chloride: 115 mmol/L — ABNORMAL HIGH (ref 101–111)
GFR calc Af Amer: >60 mL/min (ref >60)
GFR, EST NON AFRICAN AMERICAN: 58 mL/min — AB (ref >60)
GLUCOSE: 121 mg/dL — AB (ref 65–99)
Potassium: 4.1 mmol/L (ref 3.5–5.1)
Sodium: 140 mmol/L (ref 135–145)

## 2017-09-18 LAB — CBC
HEMATOCRIT: 29.7 % — AB (ref 39.0–52.0)
HEMOGLOBIN: 8.7 g/dL — AB (ref 13.0–17.0)
MCH: 21.5 pg — AB (ref 26.0–34.0)
MCHC: 29.3 g/dL — ABNORMAL LOW (ref 30.0–36.0)
MCV: 73.3 fL — AB (ref 78.0–100.0)
Platelets: 472 10*3/uL — ABNORMAL HIGH (ref 150–400)
RBC: 4.05 MIL/uL — AB (ref 4.22–5.81)
RDW: 21.3 % — ABNORMAL HIGH (ref 11.5–15.5)
WBC: 14.8 10*3/uL — AB (ref 4.0–10.5)

## 2017-09-18 LAB — CULTURE, BLOOD (ROUTINE X 2)
CULTURE: NO GROWTH
Special Requests: ADEQUATE

## 2017-09-18 LAB — GRAM STAIN

## 2017-09-18 LAB — LACTATE DEHYDROGENASE, PLEURAL OR PERITONEAL FLUID: LD FL: 217 U/L — AB (ref 3–23)

## 2017-09-18 LAB — ALBUMIN, PLEURAL OR PERITONEAL FLUID: Albumin, Fluid: 1.3 g/dL

## 2017-09-18 LAB — HEPATITIS PANEL, ACUTE
HCV AB: 0.1 {s_co_ratio} (ref 0.0–0.9)
HEP A IGM: NEGATIVE
HEP B S AG: NEGATIVE
Hep B C IgM: NEGATIVE

## 2017-09-18 LAB — GLUCOSE, CAPILLARY
Glucose-Capillary: 117 mg/dL — ABNORMAL HIGH (ref 65–99)
Glucose-Capillary: 126 mg/dL — ABNORMAL HIGH (ref 65–99)
Glucose-Capillary: 98 mg/dL (ref 65–99)
Glucose-Capillary: 113 mg/dL — ABNORMAL HIGH (ref 65–99)
Glucose-Capillary: 104 mg/dL — ABNORMAL HIGH (ref 65–99)

## 2017-09-18 LAB — GLUCOSE, PLEURAL OR PERITONEAL FLUID: GLUCOSE FL: 109 mg/dL

## 2017-09-18 LAB — BODY FLUID CELL COUNT WITH DIFFERENTIAL
Lymphs, Fluid: 14 %
Monocyte-Macrophage-Serous Fluid: 22 % — ABNORMAL LOW (ref 50–90)
Neutrophil Count, Fluid: 64 % — ABNORMAL HIGH (ref 0–25)
Total Nucleated Cell Count, Fluid: 980 cu mm (ref 0–1000)

## 2017-09-18 LAB — PROTEIN, PLEURAL OR PERITONEAL FLUID: TOTAL PROTEIN, FLUID: 3.1 g/dL

## 2017-09-18 MED ORDER — HYDROCODONE-ACETAMINOPHEN 5-325 MG PO TABS
1.0000 | ORAL_TABLET | Freq: Four times a day (QID) | ORAL | Status: DC | PRN
  Administered 2017-09-19 – 2017-09-20 (×2): 2 via ORAL
  Filled 2017-09-18 (×2): qty 2

## 2017-09-18 MED ORDER — CYCLOBENZAPRINE HCL 5 MG PO TABS
7.5000 mg | ORAL_TABLET | Freq: Once | ORAL | Status: AC
  Administered 2017-09-18: 7.5 mg via ORAL
  Filled 2017-09-18: qty 2

## 2017-09-18 MED ORDER — HYDROCODONE-ACETAMINOPHEN 5-325 MG PO TABS
2.0000 | ORAL_TABLET | Freq: Once | ORAL | Status: AC
  Administered 2017-09-18: 2 via ORAL
  Filled 2017-09-18: qty 2

## 2017-09-18 MED ORDER — LEVETIRACETAM 500 MG/5ML IV SOLN
500.0000 mg | Freq: Two times a day (BID) | INTRAVENOUS | Status: DC
  Administered 2017-09-18 (×2): 500 mg via INTRAVENOUS
  Filled 2017-09-18 (×3): qty 5

## 2017-09-18 MED ORDER — LIDOCAINE HCL 1 % IJ SOLN
INTRAMUSCULAR | Status: AC
  Filled 2017-09-18: qty 10

## 2017-09-18 NOTE — Progress Notes (Signed)
OT Cancellation Note  Patient Details Name: [name]. MRN: [id number] DOB: 01-Nov-1940   Cancelled Treatment:    Reason Eval/Treat Not Completed: Other (comment).  Spoke to SW.  Pt is a LTC resident and needs total A. Will sign off in acute setting.    [name] 09/18/2017, 11:18 AM  Lesle Chris, OTR/L [phone] 09/18/2017

## 2017-09-18 NOTE — Progress Notes (Signed)
Physical Therapy Discharge Patient Details Name: [name]. MRN: [id number] DOB: 08/24/1941 Today's Date: 09/18/2017 Time:  -     Patient discharged from PT services secondary to per MSW, patient from long term SNF and required total care.   GP    Tresa Endo PT [phone]  Claretha Cooper 09/18/2017, 11:37 AM

## 2017-09-18 NOTE — Progress Notes (Signed)
  Speech Language Pathology Treatment: Dysphagia  Patient Details Name: [name]. MRN: [id number] DOB: 1941/04/27 Today's Date: 09/18/2017 Time: 1211-1227 SLP Time Calculation (min) (ACUTE ONLY): 16 min  Assessment / Plan / Recommendation Clinical Impression  Pt more alert and participatory after paracentesis.  Consumption of thin liquids from straw has been difficult per RN; noted with increased coughing episodes during straw use; improved toleration of thins observed when drinking from side of cup.  Consumed applesauce with no oral holding, no overt signs of difficulty. Appetite has been poor.  Recommend continuing dysphagia 3 diet, thin liquids to improve choices; meds crushed in puree.  No straws.  Assist pt with self-feeding as able (he fed himself with hand-over-hand assist).  No further acute care SLP intervention warranted - our services will sign off.    HPI HPI: 76 yo male adm to [facility] with N/V, pt with possible mild ileus per DG abdomen.  Also found to have probable right lower lobe pna, concerning for aspiration.  Per notes, high concerns for metastatic cancer (multiple liver lesions on both lobes), possible omentum involvement with malignant ascites, large right pleural effusion, + segmental PE, + debris in bronchus.  Underwent paracentesis 12/18.  PMH + for CVA, pancreatitis, dementia, smoker, DM2, anxiety, HTN.  Pt has had prior MBS with recommendation for dys1/honey diet due to silent penetration of nectar and inability for pt to follow directions.  Primary SLP phoned SNF and spoke to nurse Tirenda re: pt's premorbid diet/swallow -She reports pt on soft/thin diet and takes medicine crushed with puree with good tolerance until this n/v event.       SLP Plan  All goals met       Recommendations  Diet recommendations: Dysphagia 3 (mechanical soft);Thin liquid Liquids provided via: Cup;No straw Medication Administration: Crushed with puree Supervision: Staff to assist with  self feeding Compensations: Minimize environmental distractions                Oral Care Recommendations: Oral care BID Follow up Recommendations: None SLP Visit Diagnosis: Dysphagia, oropharyngeal phase (R13.12) Plan: All goals met       GO                Juan Quam Laurice 09/18/2017, 12:38 PM

## 2017-09-18 NOTE — Procedures (Signed)
Ultrasound-guided diagnostic and therapeutic paracentesis performed yielding 4.4 liters of hazy,amber fluid. No immediate complications.  The fluid was submitted to the lab for pre-ordered studies.  Would await results of cytology from ascitic fluid before pursuing any additional interventions on patient.  He is lethargic with soft BPs.

## 2017-09-18 NOTE — Clinical Social Work Note (Signed)
Clinical Social Work Assessment  Patient Details  Name: [name]. MRN: [id number] Date of Birth: 26-May-1941  Date of referral:  09/18/17               Reason for consult:  Facility Placement, Discharge Planning                Permission sought to share information with:  Case Manager, Customer service manager, Family Supports Permission granted to share information::  Yes, Verbal Permission Granted  Name::     Freight forwarder::  [facility]  Relationship::  Daughter  Contact Information:     Housing/Transportation Living arrangements for the past 2 months:  [place] of Information:  Facility, Adult Children Patient Interpreter Needed:  None Criminal Activity/Legal Involvement Pertinent to Current Situation/Hospitalization:  No - Comment as needed Significant Relationships:  Adult Children, Warehouse manager Lives with:  Facility Resident Do you feel safe going back to the place where you live?  Yes Need for family participation in patient care:  Yes (Comment)  Care giving concerns:  No care giving concerns at the time of assessment.  tt  Facilities manager / plan:  LCSW following for SNF placement.  Patient is long term care resident from [facility].  Patient was admitted to the hospital for HCAP (healthcare-associated pneumonia.  Patient was not able to participate in assessment. Patient was asleep and did not arose to LCSW voice or knock.  LCSW spoke with facility and daughter, [name] to complete assessment.   According to facility patient is long term care and is total care in his ADLs.   Daughter reports that patient has been long term care for about 3 months. [name] stated that patient originally went in to facility for short term rehab, but was unable to rehab. She reports that patients condition has declined in the past 2 months and that he is even having trouble feeding himself. She also reports that he has a poor appetite  and eats very little even with assistance.   PLAN: Patient will return to SNF, [facility] at dc.    Employment status:  Retired Forensic scientist:  Medicare PT Recommendations:  Not assessed at this time(was unable to assess) Information / Referral to community resources:     Patient/Family's Response to care:  Daughter expressed her concerns about patients rapid decline. LCSW listened to concerns. Daughter appreciative of LCSW follow up.   Patient/Family's Understanding of and Emotional Response to Diagnosis, Current Treatment, and Prognosis:  Family is understanding of diagnosis and agreeable to current treatment plan.   Emotional Assessment Appearance:  Appears stated age Attitude/Demeanor/Rapport:  Unable to Assess Affect (typically observed):  Unable to Assess Orientation:  Oriented to Self Alcohol / Substance use:  Not Applicable Psych involvement (Current and /or in the community):  No (Comment)  Discharge Needs  Concerns to be addressed:  No discharge needs identified Readmission within the last 30 days:  No Current discharge risk:  None Barriers to Discharge:  Continued Medical Work up   [employer], LCSW 09/18/2017, 4:27 PM

## 2017-09-18 NOTE — Progress Notes (Signed)
PT Cancellation Note  Patient Details Name: [name]. MRN: [id number] DOB: 02-Jan-1941   Cancelled Treatment:    Reason Eval/Treat Not Completed: Medical issues which prohibited therapy, patient may have para/thoracentesis, also noted Palliative care consult   PT will check back another time.    Claretha Cooper 09/18/2017, 9:07 AM Tresa Endo PT [phone]

## 2017-09-18 NOTE — Progress Notes (Signed)
PROGRESS NOTE  [name] Anes. MAU:[id number] DOB: July 20, 1941 DOA: 09/12/2017 PCP: [name], DO  Brief Summary:   Patient is sent from SNF to ED due to altered mental status/dehydration Per chart review patient has ongoing active issues since October with decrease oral intake, intermittent n/v,  weight loss. Lipase and lft wnl. Initial x ray on admission concerns for aspiration pneumonia and possible ileus. He is started on abx/ivf and admitted to the hospital   HPI/Recap of past 24 hours:  RN reports patient does not take oral meds consistently, patient continue to have intermittent tachycardia and intermittent vomiting  No cough, on room air, no fever     Assessment/Plan: Principal Problem:   HCAP (healthcare-associated pneumonia) Active Problems:   Altered mental status   AKI (acute kidney injury) ([name])   Type II diabetes mellitus with neurological manifestations ([name])   Thrombocytosis ([name])   Leukocytosis   Tachycardia    N/V Intermittent/weight loss since October -Denies  abdominal pain. He does has constipation on x ray.  -Enema/stool softener , had bm on 12/15 -vomitedx1 on 12/15, vomited n 12/17 -ct ab/pel obtained on 12/17 concerns for metastatic cancer ( multiple liver lesions in both lobes), possible omentum involvement with large ascites, large right pleural effusion, + segmental PE, + debris in bronchus, bone mottled appearance. -cea/afp unremarkable, there is a history of bladder cancer per chart review. -not taking oral meds consistently, change meds to iv S/p paracentesis on 12/18 with 4liter removed, cytology pending, thoracentesis planned on 12/19. IR following.  Gallstone on x ray, lft wnl/ lipase wnl. No abdominal pain.   abdominal US  Multiple gallstones packing the gallbladder lumen. No sonographic evidence of acute cholecystitis"  H/o erosive esophagitis: continue ppi , he denies pain. He does not eat much.  Aspiration pneumonia? With  tachycardia, leukocytosis and lactic acidosis on presentation -He was started on vanc/cefepime on admission. -mrsa screen negative.  vanc d/ed, change cefepime to unasyn then augmentin last dose on 12/18. -Swallow eval recommend soft diet, thin liquid.  Sinus tachycardia/atrial tachycardia: iv lopressor, ddimer elevated, pretest probability is high for DVT/PE due to baseline immobile.  -Venous doppler +acute DVT involving a very small segment of the distal Femoral vein left leg. -Echo no right heart strain , lvef wnl, troponin negative.  -CTA chest on 12/17 + PE - tsh 2.9 -started on lovenox  AKI onc CKD II/Urinary retention: -bladder scan with 543 initially on presentation,  psa 3.2,  -urinary retention likely from neurogenic bladder /constipation -ua no infection,  -Difficult foley placement, urology consulted, foley placed by urology on 12/14, Treat constipation, trial of flomax  -cr improving, Continue hydration due to poor oral intake, Repeat bmp in am, renal dosing meds   Hypokalemia; replace k replaced,  Mag 2.5   HTN: now borderline bp, hold home meds norvasc,  On betablocker, Continue hydration.  Insulin dependent dm2 -a1c 10.5 -Patient does not take oral consistantly, on ssi and hypoglycemia protocol.   H/o CVA/seizure/dementia/dysphagia Oriented to person, know he is in the hospital. Now oriented to time On iv keppra due to inconsistent meds intake.   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 Daughter reports patient was discharged to [facility] after  hospitalization in 05/2017, then he was sent to [facility], patient now is a long term resident at [facility] for 1-2 months due to it was determined that he was not able to participate rehab. Daughter has not seem patient for a few months but reports patient has been progressive decline and recent significant weight loss.  Per chart review, patient has been sent to ED multiple times recently due to not eating and dehydration.  Daughter reports patient declined feeding tube placement in the past. Daughter reports patient is a full code. I have discussed with daughter that the progressive decline likely will be ongoing and less likely reversible.    Very poor prognosis. Palliative care consulted.     Code Status: full  Family Communication: daughter over the phone on 12/13, son at bedside on 12/15, daughter over the phone on 12/17 (daughter is updated with CT findings, she is aware about possible cancer diagnosis)  Disposition Plan: pending goals of care discussion,    Consultants:  Urology  Palliative care  IR  Procedures:  Foley placement  Paracentesis on 12/18  Antibiotics:  Vanc/cefepime from admission to 12/13  unasyn from 12/13 to 12/15  augmentin from 12/15 to 12/18   Objective: BP 106/77 (BP Location: Right Arm)   Pulse 86   Temp 98.1 F (36.7 C) (Oral)   Resp 20   SpO2 96%   Intake/Output Summary (Last 24 hours) at 09/18/2017 0733 Last data filed at 09/18/2017 0433 Gross per 24 hour  Intake 120 ml  Output 450 ml  Net -330 ml   There were no vitals filed for this visit.  Exam: Patient is examined daily including today on 09/18/2017, exams remain the same as of yesterday except that has changed    General:  Thin, frail, cachectic , not oriented to time, knows he is in the hospital, follow commands, dysarthria ( likely baseline), bilateral upper extremity contracture, left worse than right, foley in place  Cardiovascular: intermittent  tachcycardia   Respiratory: diminished at basis, no wheezing, no rales, no rhonchi  Abdomen: Soft/ND/NT, positive BS  Musculoskeletal: No Edema,  able to lift legs against gravity today, but not able to lift against resistance  Neuro: alert, oriented to place and person, chronic upper extremity contractures  Data Reviewed: Basic Metabolic Panel: Recent Labs  Lab 09/14/17 0611 09/15/17 0625 09/16/17 0621 09/17/17 0538 09/18/17 0521  NA 136 140 139 139 140  K 4.1 3.9 4.0 4.2 4.1  CL 106 110 111 112* 115*  CO2 21* 21* 19* 19* 19*  GLUCOSE 125* 106* 145* 154* 121*  BUN 47* 41* 31* 30* 28*  CREATININE 1.36* 1.25* 1.07 1.12 1.17  CALCIUM 8.8* 8.6* 8.6* 8.9 8.9  MG 2.5*  --  2.0  --   --    Liver Function Tests: Recent Labs  Lab 09/12/17 09/12/17 1631 09/13/17 0410 09/15/17 0625  AST 14 20 20 21   ALT 5* 11* 9* 9*  ALKPHOS 112 99 82 83  BILITOT  --  0.5 0.5 0.5  PROT  --  7.6 6.1* 5.8*  ALBUMIN  --  2.7* 2.2* 2.2*   Recent Labs  Lab 09/15/17 0625  LIPASE 12   Recent Labs  Lab 09/15/17 0625  AMMONIA 29   CBC: Recent Labs  Lab 09/12/17 09/12/17 1631 09/14/17 0611 09/15/17 0625 09/17/17 0538 09/18/17 0521  WBC 14.5 15.6* 15.7* 16.5* 14.5*  14.8*  NEUTROABS 12 13.3* 12.4*  --  12.2*  --   HGB 10.1* 11.7* 9.1* 9.5* 9.0* 8.7*  HCT 33* 38.4* 30.5* 32.3* 30.5* 29.7*  MCV  --  71.9* 72.3* 73.6* 73.1* 73.3*  PLT 638* 561* 511* 518* 500* 472*   Cardiac Enzymes:   Recent Labs  Lab 09/13/17 0010 09/13/17 0410 09/13/17 0636  TROPONINI <0.03 <0.03 <0.03   BNP (last 3 results) No results for input(s): BNP in the last 8760 hours.  ProBNP (last 3 results) No results for input(s): PROBNP in the last 8760 hours.  CBG: Recent Labs  Lab 09/17/17 1126 09/17/17 1639 09/17/17 2053 09/17/17 2344 09/18/17 0430  GLUCAP 120* 134* 89 125* 117*    Recent Results (from the past 240 hour(s))  Blood culture (routine x 2)     Status: None   Collection Time: 09/12/17   5:20 PM  Result Value Ref Range Status   Specimen Description BLOOD RIGHT ANTECUBITAL  Final   Special Requests   Final    BOTTLES DRAWN AEROBIC AND ANAEROBIC Blood Culture adequate volume   Culture   Final    NO GROWTH 5 DAYS Performed at [facility], [place] [address] [place], [place] [address]    Report Status 09/17/2017 FINAL  Final  Urine culture     Status: None   Collection Time: 09/12/17  5:42 PM  Result Value Ref Range Status   Specimen Description URINE, CATHETERIZED  Final   Special Requests NONE  Final   Culture   Final    NO GROWTH Performed at [facility], [place] [address] [place], [place] [address]    Report Status 09/14/2017 FINAL  Final  Respiratory Panel by PCR     Status: None   Collection Time: 09/12/17  9:47 PM  Result Value Ref Range Status   Adenovirus NOT DETECTED NOT DETECTED Final   Coronavirus 229E NOT DETECTED NOT DETECTED Final   Coronavirus HKU1 NOT DETECTED NOT DETECTED Final   Coronavirus NL63 NOT DETECTED NOT DETECTED Final   Coronavirus OC43 NOT DETECTED NOT DETECTED Final   Metapneumovirus NOT DETECTED NOT DETECTED Final   Rhinovirus / Enterovirus NOT DETECTED NOT DETECTED Final   Influenza A NOT DETECTED NOT DETECTED Final   Influenza B NOT DETECTED NOT DETECTED Final   Parainfluenza Virus 1 NOT DETECTED NOT DETECTED Final   Parainfluenza Virus 2 NOT DETECTED NOT DETECTED Final   Parainfluenza Virus 3 NOT DETECTED NOT DETECTED Final   Parainfluenza Virus 4 NOT DETECTED NOT DETECTED Final   Respiratory Syncytial Virus NOT DETECTED NOT DETECTED Final   Bordetella pertussis NOT DETECTED NOT DETECTED Final   Chlamydophila pneumoniae NOT DETECTED NOT DETECTED Final   Mycoplasma pneumoniae NOT DETECTED NOT DETECTED Final    Comment: Performed at [facility], [place] [address], [place], [place] [address]  MRSA PCR Screening     Status: None   Collection Time: 09/12/17  9:47 PM  Result Value Ref Range Status   MRSA by PCR NEGATIVE  NEGATIVE Final    Comment:        The GeneXpert MRSA Assay (FDA approved for NASAL specimens only), is one component of a comprehensive MRSA colonization surveillance program. It is not intended to diagnose MRSA infection nor to guide or monitor treatment for MRSA infections.   Culture, blood (routine x 2) Call MD if unable to obtain prior to antibiotics being given     Status: None (Preliminary result)  Collection Time: 09/12/17 11:07 PM  Result Value Ref Range Status   Specimen Description BLOOD RIGHT ANTECUBITAL  Final   Special Requests IN PEDIATRIC BOTTLE Blood Culture adequate volume  Final   Culture   Final    NO GROWTH 4 DAYS Performed at [facility], [place] [address], [place], [place] [address]    Report Status PENDING  Incomplete     Studies: Ct Angio Chest Pe W Or Wo Contrast  Result Date: 09/17/2017 CLINICAL DATA:  Decreased oral intake, intermittent nausea and vomiting, weight loss. Initial x-ray raised concern for aspiration pneumonia and possibly ileus. EXAM: CT ANGIOGRAPHY CHEST CT ABDOMEN AND PELVIS WITH CONTRAST TECHNIQUE: Multidetector CT imaging of the chest was performed using the standard protocol during bolus administration of intravenous contrast. Multiplanar CT image reconstructions and MIPs were obtained to evaluate the vascular anatomy. Multidetector CT imaging of the abdomen and pelvis was performed using the standard protocol during bolus administration of intravenous contrast. CONTRAST:  147mL ISOVUE-370 IOPAMIDOL (ISOVUE-370) INJECTION 76% COMPARISON:  CT abdomen dated 04/29/2016 FINDINGS: CTA CHEST FINDINGS Cardiovascular: Single small pulmonary embolism is seen within a lateral segmental pulmonary artery branch to the right lower lobe. No additional pulmonary embolism identified, although some of the most peripheral segmental and subsegmental pulmonary arteries in the upper lobes are difficult to definitively characterize due to mild patient  breathing motion artifact. 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 Mild aortic atherosclerosis. No evidence of aortic dissection seen. Mild cardiomegaly. No pericardial effusion. Coronary artery calcifications. Mediastinum/Nodes: Large hiatal hernia. Upper esophagus is unremarkable. No mass or enlarged lymph nodes appreciated within the mediastinum or perihilar regions. Masslike material within the lower aspects of the trachea, most likely mucus secretions or debris. Lungs/Pleura: Large right pleural effusion and moderate-sized left pleural effusion, both with associated compressive atelectasis at each lung base. Chronic scarring/ fibrosis and emphysematous changes within the upper lobes. Musculoskeletal: Osteopenia. No acute or suspicious osseous finding. Review of the MIP images confirms the above findings. CT ABDOMEN and PELVIS FINDINGS Hepatobiliary: Multiple (at least 6) lesions within the bilateral liver lobes. Largest lesion is within the posterior inferior segment of the right liver lobe (segment 6) which measures 4.2 by 3 cm. All lesions are new compared to the previous abdomen CT and are consistent with metastases. Gallbladder is filled with stones. No convincing evidence of acute cholecystitis. Bile ducts appear stable. Pancreas: Stable appearance of chronic pancreatitis. No evidence of a superimposed acute pancreatitis. Spleen: Normal in size without focal abnormality. Adrenals/Urinary Tract: Adrenal glands appear normal. Bilateral renal cysts. No renal stone or hydronephrosis seen. Bladder is decompressed by Foley catheter. Stomach/Bowel: No dilated large or small bowel. Extensive diverticulosis without evidence of acute diverticulitis. Vascular/Lymphatic: Aortic atherosclerosis. No enlarged lymph nodes identified. Reproductive: No prostate gland enlargement seen. Other: Large volume ascites. There is additional ill-defined omental thickening and peritoneal wall  thickening suggesting metastatic implants, most likely malignant ascites. Musculoskeletal: Osteopenia limits characterization of osseous detail. Mottled appearance of multiple thoracic and lumbar vertebral bodies, suspicious for early osseous metastases. Anasarca. Review of the MIP images confirms the above findings. IMPRESSION: 1. Single small pulmonary embolism within a lateral segmental pulmonary artery branch to the right lower lobe. This pulmonary embolism is of uncertain age, possibly chronic given its linear configuration. No additional pulmonary emboli identified, although some peripheral pulmonary artery branches are difficult to definitively characterize due to mild patient breathing motion artifact. 2. Large right pleural effusion and moderate-sized left pleural  effusion, both with adjacent compressive atelectasis. 3. Masslike material within the lower trachea, most likely mucous secretions and/or debris. 4. New liver metastases, bilateral liver lobes, largest metastasis within segment 6 of the right liver lobe measuring 4.2 x 3 cm. 5. Large volume ascites, almost certainly malignant ascites. Associated ill-defined thickening of the omentum and peritoneal wall suggesting associated metastatic implants. 6. Multiple thoracic and lumbar vertebral bodies with a mottled appearance which could represent patchy osteopenia or early metastases, favor early osseous metastases. 7. Large hiatal hernia, stable in configuration. 8. Cholelithiasis without evidence of acute cholecystitis. 9. Diverticulosis without evidence of acute diverticulitis. No evidence of bowel obstruction. 10. Aortic atherosclerosis. 11. 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 12. Chronic scarring/fibrosis and emphysematous changes within the upper lungs. 13. Evidence of chronic pancreatitis, similar to previous exam. 14. Bladder is decompressed by Foley catheter. On the previous study, a bladder wall  mass was identified, suspected primary neoplasm. 15. Anasarca. These results were called by telephone at the time of interpretation on 09/17/2017 at 2:35 pm to Dr. Florencia Reasons , who verbally acknowledged these results. Electronically Signed   By: Franki Cabot M.D.   On: 09/17/2017 14:38   Ct Abdomen Pelvis W Contrast  Result Date: 09/17/2017 CLINICAL DATA:  Decreased oral intake, intermittent nausea and vomiting, weight loss. Initial x-ray raised concern for aspiration pneumonia and possibly ileus. EXAM: CT ANGIOGRAPHY CHEST CT ABDOMEN AND PELVIS WITH CONTRAST TECHNIQUE: Multidetector CT imaging of the chest was performed using the standard protocol during bolus administration of intravenous contrast. Multiplanar CT image reconstructions and MIPs were obtained to evaluate the vascular anatomy. Multidetector CT imaging of the abdomen and pelvis was performed using the standard protocol during bolus administration of intravenous contrast. CONTRAST:  174mL ISOVUE-370 IOPAMIDOL (ISOVUE-370) INJECTION 76% COMPARISON:  CT abdomen dated 04/29/2016 FINDINGS: CTA CHEST FINDINGS Cardiovascular: Single small pulmonary embolism is seen within a lateral segmental pulmonary artery branch to the right lower lobe. No additional pulmonary embolism identified, although some of the most peripheral segmental and subsegmental pulmonary arteries in the upper lobes are difficult to definitively characterize due to mild patient breathing motion artifact. 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 Mild aortic atherosclerosis. No evidence of aortic dissection seen. Mild cardiomegaly. No pericardial effusion. Coronary artery calcifications. Mediastinum/Nodes: Large hiatal hernia. Upper esophagus is unremarkable. No mass or enlarged lymph nodes appreciated within the mediastinum or perihilar regions. Masslike material within the lower aspects of the trachea, most likely mucus secretions or debris.  Lungs/Pleura: Large right pleural effusion and moderate-sized left pleural effusion, both with associated compressive atelectasis at each lung base. Chronic scarring/ fibrosis and emphysematous changes within the upper lobes. Musculoskeletal: Osteopenia. No acute or suspicious osseous finding. Review of the MIP images confirms the above findings. CT ABDOMEN and PELVIS FINDINGS Hepatobiliary: Multiple (at least 6) lesions within the bilateral liver lobes. Largest lesion is within the posterior inferior segment of the right liver lobe (segment 6) which measures 4.2 by 3 cm. All lesions are new compared to the previous abdomen CT and are consistent with metastases. Gallbladder is filled with stones. No convincing evidence of acute cholecystitis. Bile ducts appear stable. Pancreas: Stable appearance of chronic pancreatitis. No evidence of a superimposed acute pancreatitis. Spleen: Normal in size without focal abnormality. Adrenals/Urinary Tract: Adrenal glands appear normal. Bilateral renal cysts. No renal stone or hydronephrosis seen. Bladder is decompressed by Foley catheter. Stomach/Bowel: No dilated large or small bowel. Extensive  diverticulosis without evidence of acute diverticulitis. Vascular/Lymphatic: Aortic atherosclerosis. No enlarged lymph nodes identified. Reproductive: No prostate gland enlargement seen. Other: Large volume ascites. There is additional ill-defined omental thickening and peritoneal wall thickening suggesting metastatic implants, most likely malignant ascites. Musculoskeletal: Osteopenia limits characterization of osseous detail. Mottled appearance of multiple thoracic and lumbar vertebral bodies, suspicious for early osseous metastases. Anasarca. Review of the MIP images confirms the above findings. IMPRESSION: 1. Single small pulmonary embolism within a lateral segmental pulmonary artery branch to the right lower lobe. This pulmonary embolism is of uncertain age, possibly chronic given its  linear configuration. No additional pulmonary emboli identified, although some peripheral pulmonary artery branches are difficult to definitively characterize due to mild patient breathing motion artifact. 2. Large right pleural effusion and moderate-sized left pleural effusion, both with adjacent compressive atelectasis. 3. Masslike material within the lower trachea, most likely mucous secretions and/or debris. 4. New liver metastases, bilateral liver lobes, largest metastasis within segment 6 of the right liver lobe measuring 4.2 x 3 cm. 5. Large volume ascites, almost certainly malignant ascites. Associated ill-defined thickening of the omentum and peritoneal wall suggesting associated metastatic implants. 6. Multiple thoracic and lumbar vertebral bodies with a mottled appearance which could represent patchy osteopenia or early metastases, favor early osseous metastases. 7. Large hiatal hernia, stable in configuration. 8. Cholelithiasis without evidence of acute cholecystitis. 9. Diverticulosis without evidence of acute diverticulitis. No evidence of bowel obstruction. 10. Aortic atherosclerosis. 11. 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 12. Chronic scarring/fibrosis and emphysematous changes within the upper lungs. 13. Evidence of chronic pancreatitis, similar to previous exam. 14. Bladder is decompressed by Foley catheter. On the previous study, a bladder wall mass was identified, suspected primary neoplasm. 15. Anasarca. These results were called by telephone at the time of interpretation on 09/17/2017 at 2:35 pm to Dr. Florencia Reasons , who verbally acknowledged these results. Electronically Signed   By: Franki Cabot M.D.   On: 09/17/2017 14:38    Scheduled Meds: . amoxicillin-clavulanate  1 tablet Oral Q12H  . ARIPiprazole  2 mg Oral Daily  . aspirin EC  81 mg Oral Daily  . atorvastatin  80 mg Oral q1800  . cholecalciferol  2,000 Units Oral Daily  . enoxaparin  (LOVENOX) injection  60 mg Subcutaneous Q12H  . feeding supplement (ENSURE ENLIVE)  237 mL Oral BID BM  . folic acid  1 mg Oral Daily  . insulin aspart  0-9 Units Subcutaneous Q4H  . levETIRAcetam  500 mg Oral BID  . lipase/protease/amylase  12,000 Units Oral TID WC  . metoprolol tartrate  5 mg Intravenous Q4H  . pantoprazole  40 mg Oral BID  . polyethylene glycol  17 g Oral Daily  . senna-docusate  1 tablet Oral BID  . sertraline  50 mg Oral Daily  . tamsulosin  0.4 mg Oral Daily    Continuous Infusions: . lactated ringers 75 mL/hr at 09/17/17 0502     Time spent: 72mins, case discussed with IR and palliative care I have personally reviewed and interpreted on  09/18/2017 daily labs, tele strips, imagings as discussed above under date review session and assessment and plans.  I reviewed all nursing notes, pharmacy notes, consult note, vitals, pertinent old records  I have discussed plan of care as described above with RN , patient  on 09/18/2017   Florencia Reasons MD, PhD  [facility] Pager [phone]. If 7PM-7AM, please contact night-coverage at [email or web address], password [facility]  09/18/2017, 7:33 AM  LOS: 6 days

## 2017-09-18 NOTE — Care Management Important Message (Signed)
Important Message  Patient Details  Name: Zayon Trulson. MRN: [id number] Date of Birth: 11-23-1940   Medicare Important Message Given:  Yes    Kerin Salen 09/18/2017, 11:19 AMImportant Message  Patient Details  Name: Lamarius Dirr. MRN: [id number] Date of Birth: 07/19/1941   Medicare Important Message Given:  Yes    Kerin Salen 09/18/2017, 11:19 AM

## 2017-09-18 NOTE — Progress Notes (Signed)
Calorie Count Note  48 hour calorie count ordered.  Diet: DYS 3 thin liquids Supplements: Ensure Enlive BID  Day 2- 12/17 No food receipts left in folder. Spoke with nurse who reports pt consumed two bites of applesauce with medications and nothing for the rest of the day. Pt noted to have vomiting episode early 12/17 morning. Nurse reports he refused all food due to fear of vomiting again.   Total intake: 0 kcal (0 of minimum estimated needs)  0 protein (0% of minimum estimated needs)  Nutrition Dx: Severe Malnutritionrelated to chronic illness(dementia)as evidenced by severe fat depletion, severe muscle depletion.  Goal: Pt to meet >/= 90% of their estimated nutrition needs   Intervention: Provide Ensure Enlive po BID, each supplement provides 350 kcal and 20 grams of protein  [name] RD, LDN Clinical Nutrition Pager # [phone]

## 2017-09-19 DIAGNOSIS — R188 Other ascites: Secondary | ICD-10-CM

## 2017-09-19 DIAGNOSIS — R160 Hepatomegaly, not elsewhere classified: Secondary | ICD-10-CM

## 2017-09-19 LAB — BASIC METABOLIC PANEL
ANION GAP: 6 (ref 5–15)
BUN: 30 mg/dL — ABNORMAL HIGH (ref 6–20)
CALCIUM: 8.6 mg/dL — AB (ref 8.9–10.3)
CHLORIDE: 114 mmol/L — AB (ref 101–111)
CO2: 20 mmol/L — AB (ref 22–32)
CREATININE: 1.47 mg/dL — AB (ref 0.61–1.24)
GFR calc non Af Amer: 44 mL/min — ABNORMAL LOW (ref >60)
GFR, EST AFRICAN AMERICAN: 51 mL/min — AB (ref >60)
Glucose, Bld: 109 mg/dL — ABNORMAL HIGH (ref 65–99)
Potassium: 4 mmol/L (ref 3.5–5.1)
SODIUM: 140 mmol/L (ref 135–145)

## 2017-09-19 LAB — CBC WITH DIFFERENTIAL/PLATELET
BASOS ABS: 0 10*3/uL (ref 0.0–0.1)
Basophils Relative: 0 %
EOS ABS: 0.1 10*3/uL (ref 0.0–0.7)
Eosinophils Relative: 1 %
HEMATOCRIT: 29.5 % — AB (ref 39.0–52.0)
HEMOGLOBIN: 8.8 g/dL — AB (ref 13.0–17.0)
LYMPHS PCT: 16 %
Lymphs Abs: 2.3 10*3/uL (ref 0.7–4.0)
MCH: 21.9 pg — ABNORMAL LOW (ref 26.0–34.0)
MCHC: 29.8 g/dL — ABNORMAL LOW (ref 30.0–36.0)
MCV: 73.6 fL — ABNORMAL LOW (ref 78.0–100.0)
MONOS PCT: 8 %
Monocytes Absolute: 1.2 10*3/uL — ABNORMAL HIGH (ref 0.1–1.0)
Neutro Abs: 10.9 10*3/uL — ABNORMAL HIGH (ref 1.7–7.7)
Neutrophils Relative %: 75 %
Platelets: 505 10*3/uL — ABNORMAL HIGH (ref 150–400)
RBC: 4.01 MIL/uL — AB (ref 4.22–5.81)
RDW: 21.3 % — AB (ref 11.5–15.5)
WBC: 14.5 10*3/uL — AB (ref 4.0–10.5)

## 2017-09-19 LAB — GLUCOSE, CAPILLARY
GLUCOSE-CAPILLARY: 100 mg/dL — AB (ref 65–99)
GLUCOSE-CAPILLARY: 105 mg/dL — AB (ref 65–99)
GLUCOSE-CAPILLARY: 108 mg/dL — AB (ref 65–99)
GLUCOSE-CAPILLARY: 92 mg/dL (ref 65–99)
Glucose-Capillary: 127 mg/dL — ABNORMAL HIGH (ref 65–99)
Glucose-Capillary: 83 mg/dL (ref 65–99)

## 2017-09-19 MED ORDER — SODIUM CHLORIDE 0.9 % IV SOLN
INTRAVENOUS | Status: DC
  Administered 2017-09-19 – 2017-09-24 (×9): via INTRAVENOUS

## 2017-09-19 MED ORDER — LEVETIRACETAM 500 MG PO TABS
500.0000 mg | ORAL_TABLET | Freq: Two times a day (BID) | ORAL | Status: DC
  Administered 2017-09-19 – 2017-09-22 (×7): 500 mg via ORAL
  Filled 2017-09-19 (×7): qty 1

## 2017-09-19 NOTE — Progress Notes (Signed)
PROGRESS NOTE    Vincent Anes.  XAJ:[id number] DOB: January 17, 1941 DOA: 09/12/2017 PCP: [name], DO    Brief Narrative:   [name]  is a 76 y.o. 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      Assessment & Plan:   Principal Problem:   HCAP (healthcare-associated pneumonia) Active Problems:   Altered mental status   AKI (acute kidney injury) ([place])   Type II diabetes mellitus with neurological manifestations ([place])   Thrombocytosis (HCC)   Leukocytosis   Tachycardia   Healthcare associated pneumonia/aspiration pneumonia Patient was tachycardic with mild elevated lactic acid and chest x-ray showing possible pneumonia.  He was started on IV vancomycin and cefepime later on transition transition to Unasyn and then to Augmentin to complete the course.   Sinus tachycardia Patient was found to have an acute DVT in the left leg, CT angiogram showed pulmonary embolism. Echocardiogram does not show any right heart strain.  Patient was started on Lovenox and in view of his renal function his Lovenox was changed to once daily.   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  He underwent paracentesis on December 18 and 4 L were removed.  Plan for thoracentesis later today.  Cytology report from the peritoneal fluid is still pending.   Acute on chronic kidney disease stage II/urinary retention Urology consulted for urinary retention and Foley catheter was placed. Patient's creatinine is back at elevated at 1.47.  Repeat renal parameters in the morning.  Continue with IV hydration.   Hypertension well-controlled today.  Continue with IV fluids   Insulin-dependent diabetes Hemoglobin A1c is 10.5 Continue with sensitive sliding scale insulin.    CVA/seizures/dysphagia  Continue with Keppra.   History of  alcohol abuse/history of erosive esophagitis/chronic pancreatitis Continue with pancrelipase.  Continue with PPI.   Severe protein calorie malnutrition Dietary consulted  History of severe major depression Continue with Zoloft and Abilify.    DVT prophylaxis: lovenox.  Code Status: full code.  Family Communication: none at bedside.  Disposition Plan: pending palliative care.    Consultants:   None.    Procedures: none.   Antimicrobials: none.   Subjective: Denies any new complaints.   Objective: Vitals:   09/18/17 1445 09/18/17 1752 09/19/17 0637 09/19/17 1320  BP: 111/81 114/61 (!) 80/51 94/67  Pulse:    (!) 125  Resp:   20 20  Temp: 97.7 F (36.5 C)  (!) 97.5 F (36.4 C) (!) 97.3 F (36.3 C)  TempSrc: Oral  Oral Oral  SpO2: 95%  92% 96%    Intake/Output Summary (Last 24 hours) at 09/19/2017 1748 Last data filed at 09/19/2017 1016 Gross per 24 hour  Intake 120 ml  Output 250 ml  Net -130 ml   There were no vitals filed for this visit.  Examination:  General exam: Appears calm and comfortable not in any distress.  Respiratory system: Clear to auscultation. Respiratory effort normal. Cardiovascular system: S1 & S2 heard, RRR. No JVD, murmurs, rubs, gallops or clicks. No pedal edema. Gastrointestinal system: Abdomen is nondistended, soft and nontender. No organomegaly or masses felt. Normal bowel sounds heard. Central nervous system: Alert and slow to respond to questions.  Extremities: Symmetric 5 x 5 power. Skin: No rashes, lesions or ulcers Psychiatry: Mood & affect appropriate.     Data Reviewed: I have personally reviewed following  labs and imaging studies  CBC: Recent Labs  Lab 09/14/17 0611 09/15/17 0625 09/17/17 0538 09/18/17 0521 09/19/17 0615  WBC 15.7* 16.5* 14.5* 14.8* 14.5*  NEUTROABS 12.4*  --  12.2*  --  10.9*  HGB 9.1* 9.5* 9.0* 8.7* 8.8*  HCT 30.5* 32.3* 30.5* 29.7* 29.5*  MCV 72.3* 73.6* 73.1* 73.3* 73.6*  PLT 511* 518*  500* 472* 798*   Basic Metabolic Panel: Recent Labs  Lab 09/14/17 0611 09/15/17 0625 09/16/17 0621 09/17/17 0538 09/18/17 0521 09/19/17 0615  NA 136 140 139 139 140 140  K 4.1 3.9 4.0 4.2 4.1 4.0  CL 106 110 111 112* 115* 114*  CO2 21* 21* 19* 19* 19* 20*  GLUCOSE 125* 106* 145* 154* 121* 109*  BUN 47* 41* 31* 30* 28* 30*  CREATININE 1.36* 1.25* 1.07 1.12 1.17 1.47*  CALCIUM 8.8* 8.6* 8.6* 8.9 8.9 8.6*  MG 2.5*  --  2.0  --   --   --    GFR: Estimated Creatinine Clearance: 33.8 mL/min (A) (by C-G formula based on SCr of 1.47 mg/dL (H)). Liver Function Tests: Recent Labs  Lab 09/13/17 0410 09/15/17 0625  AST 20 21  ALT 9* 9*  ALKPHOS 82 83  BILITOT 0.5 0.5  PROT 6.1* 5.8*  ALBUMIN 2.2* 2.2*   Recent Labs  Lab 09/15/17 0625  LIPASE 12   Recent Labs  Lab 09/15/17 0625  AMMONIA 29   Coagulation Profile: Recent Labs  Lab 09/15/17 0603 09/16/17 0621  INR 2.62 2.04   Cardiac Enzymes: Recent Labs  Lab 09/13/17 0010 09/13/17 0410 09/13/17 0636  TROPONINI <0.03 <0.03 <0.03   BNP (last 3 results) No results for input(s): PROBNP in the last 8760 hours. HbA1C: No results for input(s): HGBA1C in the last 72 hours. CBG: Recent Labs  Lab 09/19/17 0016 09/19/17 0400 09/19/17 0712 09/19/17 1131 09/19/17 1634  GLUCAP 108* 100* 105* 127* 83   Lipid Profile: No results for input(s): CHOL, HDL, LDLCALC, TRIG, CHOLHDL, LDLDIRECT in the last 72 hours. Thyroid Function Tests: No results for input(s): TSH, T4TOTAL, FREET4, T3FREE, THYROIDAB in the last 72 hours. Anemia Panel: No results for input(s): VITAMINB12, FOLATE, FERRITIN, TIBC, IRON, RETICCTPCT in the last 72 hours. Sepsis Labs: Recent Labs  Lab 09/12/17 2046 09/14/17 0623  LATICACIDVEN 2.31* 1.1    Recent Results (from the past 240 hour(s))  Blood culture (routine x 2)     Status: None   Collection Time: 09/12/17  5:20 PM  Result Value Ref Range Status   Specimen Description BLOOD RIGHT  ANTECUBITAL  Final   Special Requests   Final    BOTTLES DRAWN AEROBIC AND ANAEROBIC Blood Culture adequate volume   Culture   Final    NO GROWTH 5 DAYS Performed at [facility], [address] [place], [place] [address]    Report Status 09/17/2017 FINAL  Final  Urine culture     Status: None   Collection Time: 09/12/17  5:42 PM  Result Value Ref Range Status   Specimen Description URINE, CATHETERIZED  Final   Special Requests NONE  Final   Culture   Final    NO GROWTH Performed at [facility], [place] [address], [place], [place] [address]    Report Status 09/14/2017 FINAL  Final  Respiratory Panel by PCR     Status: None   Collection Time: 09/12/17  9:47 PM  Result Value Ref Range Status   Adenovirus NOT DETECTED NOT DETECTED Final  Coronavirus 229E NOT DETECTED NOT DETECTED Final   Coronavirus HKU1 NOT DETECTED NOT DETECTED Final   Coronavirus NL63 NOT DETECTED NOT DETECTED Final   Coronavirus OC43 NOT DETECTED NOT DETECTED Final   Metapneumovirus NOT DETECTED NOT DETECTED Final   Rhinovirus / Enterovirus NOT DETECTED NOT DETECTED Final   Influenza A NOT DETECTED NOT DETECTED Final   Influenza B NOT DETECTED NOT DETECTED Final   Parainfluenza Virus 1 NOT DETECTED NOT DETECTED Final   Parainfluenza Virus 2 NOT DETECTED NOT DETECTED Final   Parainfluenza Virus 3 NOT DETECTED NOT DETECTED Final   Parainfluenza Virus 4 NOT DETECTED NOT DETECTED Final   Respiratory Syncytial Virus NOT DETECTED NOT DETECTED Final   Bordetella pertussis NOT DETECTED NOT DETECTED Final   Chlamydophila pneumoniae NOT DETECTED NOT DETECTED Final   Mycoplasma pneumoniae NOT DETECTED NOT DETECTED Final    Comment: Performed at [facility], [place] [address], [place], [place] [address]  MRSA PCR Screening     Status: None   Collection Time: 09/12/17  9:47 PM  Result Value Ref Range Status   MRSA by PCR NEGATIVE NEGATIVE Final    Comment:        The GeneXpert MRSA Assay (FDA approved  for NASAL specimens only), is one component of a comprehensive MRSA colonization surveillance program. It is not intended to diagnose MRSA infection nor to guide or monitor treatment for MRSA infections.   Culture, blood (routine x 2) Call MD if unable to obtain prior to antibiotics being given     Status: None   Collection Time: 09/12/17 11:07 PM  Result Value Ref Range Status   Specimen Description BLOOD RIGHT ANTECUBITAL  Final   Special Requests IN PEDIATRIC BOTTLE Blood Culture adequate volume  Final   Culture   Final    NO GROWTH 5 DAYS Performed at [facility], [place] [address], [place], [place] [address]    Report Status 09/18/2017 FINAL  Final  Culture, body fluid-bottle     Status: None (Preliminary result)   Collection Time: 09/18/17 10:31 AM  Result Value Ref Range Status   Specimen Description PERITONEAL  Final   Special Requests NONE  Final   Culture   Final    NO GROWTH 1 DAY Performed at [facility], [place] [address], [place], [place] [address]    Report Status PENDING  Incomplete  Gram stain     Status: None   Collection Time: 09/18/17 10:31 AM  Result Value Ref Range Status   Specimen Description PERITONEAL  Final   Special Requests NONE  Final   Gram Stain   Final    RARE WBC PRESENT, PREDOMINANTLY PMN NO ORGANISMS SEEN Performed at [facility], [place] [address] [place], [place] [address]    Report Status 09/18/2017 FINAL  Final         Radiology Studies: US Paracentesis  Result Date: 09/18/2017 INDICATION: Patient with history of altered mental status, malnutrition, weight loss, PE, pleural effusions, liver lesions, ascites ; request made for diagnostic and therapeutic paracentesis. EXAM: ULTRASOUND GUIDED DIAGNOSTIC AND THERAPEUTIC PARACENTESIS MEDICATIONS: None. COMPLICATIONS: None immediate. PROCEDURE: Informed written consent was obtained from the patient after a discussion of the risks, benefits and alternatives to treatment.  A timeout was performed prior to the initiation of the procedure. Initial ultrasound scanning demonstrates a large amount of ascites within the left lower abdominal quadrant. The left lower abdomen was prepped and draped in the usual sterile  fashion. 1% lidocaine was used for local anesthesia. Following this, a Yueh catheter was introduced. An ultrasound image was saved for documentation purposes. The paracentesis was performed. The catheter was removed and a dressing was applied. The patient tolerated the procedure well without immediate post procedural complication. FINDINGS: A total of approximately 4.4 liters of hazy, amber fluid was removed. Samples were sent to the laboratory as requested by the clinical team. IMPRESSION: Successful ultrasound-guided diagnostic and therapeutic paracentesis yielding 4.4 liters of peritoneal fluid. Read by: Rowe Robert, PA-C Electronically Signed   By: Markus Daft M.D.   On: 09/18/2017 11:25        Scheduled Meds: . ARIPiprazole  2 mg Oral Daily  . aspirin EC  81 mg Oral Daily  . atorvastatin  80 mg Oral q1800  . cholecalciferol  2,000 Units Oral Daily  . enoxaparin (LOVENOX) injection  60 mg Subcutaneous Q12H  . feeding supplement (ENSURE ENLIVE)  237 mL Oral BID BM  . folic acid  1 mg Oral Daily  . insulin aspart  0-9 Units Subcutaneous Q4H  . levETIRAcetam  500 mg Oral BID  . lipase/protease/amylase  12,000 Units Oral TID WC  . metoprolol tartrate  5 mg Intravenous Q4H  . pantoprazole  40 mg Oral BID  . polyethylene glycol  17 g Oral Daily  . senna-docusate  1 tablet Oral BID  . sertraline  50 mg Oral Daily  . tamsulosin  0.4 mg Oral Daily   Continuous Infusions: . lactated ringers 75 mL/hr at 09/19/17 0539     LOS: 7 days    Time spent: 35 minutes.     Hosie Poisson, MD [facility] Pager [phone]  If 7PM-7AM, please contact night-coverage [email or web address] Password [facility] 09/19/2017, 5:48 PM

## 2017-09-19 NOTE — NC FL2 (Signed)
[place] LEVEL OF CARE SCREENING TOOL     IDENTIFICATION  Patient Name: [name]. Birthdate: 06-16-1941 Sex: male Admission Date (Current Location): 09/12/2017  [facility] and [place] Number:  Herbalist and Address:  [facility],  [place] [address], [place]      Provider Number: [id number]  Attending Physician Name and Address:  Hosie Poisson, MD  Relative Name and Phone Number:       Current Level of Care: Hospital Recommended Level of Care: [place] Prior Approval Number:    Date Approved/Denied: 09/19/17 PASRR Number: [id number]  Discharge Plan: SNF    Current Diagnoses: Patient Active Problem List   Diagnosis Date Noted  . HCAP (healthcare-associated pneumonia) 09/12/2017  . Tachycardia 09/12/2017  . Type II diabetes mellitus with neurological manifestations, uncontrolled ([place]) 09/04/2017  . Thrombocytosis ([place]) 09/04/2017  . Leukocytosis 09/04/2017  . Encounter for smoking cessation counseling 07/30/2017  . Hypomagnesemia 06/21/2017  . Chronic calcific pancreatitis ([place]) 05/31/2017  . RA (rheumatoid arthritis) ([place]) 05/31/2017  . Physical deconditioning 05/31/2017  . Weight loss 05/31/2017  . Failure to thrive in adult   . Goals of care, counseling/discussion   . History of ETT   . Acute respiratory failure with hypoxia ([place])   . Acute encephalopathy   . Atelectasis   . Seizures ([place]) 04/25/2017  . Erosive esophagitis 10/05/2016  . Large hiatal hernia 10/05/2016  . Umbilical hernia without obstruction and without gangrene 05/22/2016  . Dyslipidemia associated with type 2 diabetes mellitus ([place]) 05/16/2016  . GERD without esophagitis 05/16/2016  . Severe major depression with psychotic features ([place]) 05/16/2016  . Hypoglycemia   . Fall   . Type II diabetes mellitus with neurological manifestations ([place])   . AKI (acute kidney injury) ([place]) 05/10/2016  . Nausea and vomiting   .  Hypokalemia 05/04/2016  . Edema 11/02/2014  . COPD, severity to be determined ([place]) 10/08/2014  . Severe protein-calorie malnutrition ([place]) 10/08/2014  . History of CVA (cerebrovascular accident) 10/08/2014  . 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 Essential hypertension   . Altered mental status   . 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 CAD (coronary artery disease) 05/04/2013  . Tobacco and Alcohol use 12/02/2011    Orientation RESPIRATION BLADDER Height & Weight     Self    Continent Weight:   Height:     BEHAVIORAL SYMPTOMS/MOOD NEUROLOGICAL BOWEL NUTRITION STATUS      Continent Diet(See dc summary)  Diet recommendations: Dysphagia 3 (mechanical soft);Thin liquid Liquids provided via: Cup;No straw Medication Administration: Crushed with puree Supervision: Staff to assist with self feeding Compensations: Minimize environmental distractions  AMBULATORY STATUS COMMUNICATION OF NEEDS Skin   Total Care Verbally Normal                       Personal Care Assistance Level of Assistance  Total care Bathing Assistance: Maximum assistance Feeding assistance: Limited assistance   Total Care Assistance: Maximum assistance   Functional Limitations Info  Sight, Hearing, Speech Sight Info: Adequate Hearing Info: Adequate Speech Info: Adequate    SPECIAL CARE FACTORS FREQUENCY                       Contractures      Additional Factors Info  Code Status, Allergies Code Status Info: Full Allergies Info: Dilantin Phenytoin Sodium Extended           Current Medications (09/19/2017):  This is the current hospital active medication list Current Facility-Administered Medications  Medication Dose Route Frequency Provider Last Rate Last Dose  . alum & mag hydroxide-simeth (MAALOX/MYLANTA) 200-200-20 MG/5ML suspension 15 mL  15 mL Oral Q4H PRN Florencia Reasons, MD   15 mL at 09/14/17 1526  . ARIPiprazole (ABILIFY) tablet 2 mg  2 mg Oral Daily Jani Gravel, MD   2 mg at  09/18/17 1219  . aspirin EC tablet 81 mg  81 mg Oral Daily Jani Gravel, MD   81 mg at 09/18/17 1218  . atorvastatin (LIPITOR) tablet 80 mg  80 mg Oral q1800 Jani Gravel, MD   80 mg at 09/18/17 1751  . cholecalciferol (VITAMIN D) tablet 2,000 Units  2,000 Units Oral Daily Jani Gravel, MD   2,000 Units at 09/18/17 1219  . enoxaparin (LOVENOX) injection 60 mg  60 mg Subcutaneous Q12H Aletta Edouard, MD   60 mg at 09/19/17 0539  . feeding supplement (ENSURE ENLIVE) (ENSURE ENLIVE) liquid 237 mL  237 mL Oral BID BM Florencia Reasons, MD   237 mL at 56/31/49 7026  . folic acid (FOLVITE) tablet 1 mg  1 mg Oral Daily Jani Gravel, MD   1 mg at 09/18/17 1221  . HYDROcodone-acetaminophen (NORCO/VICODIN) 5-325 MG per tablet 1-2 tablet  1-2 tablet Oral Q6H PRN Schorr, Rhetta Mura, NP      . insulin aspart (novoLOG) injection 0-9 Units  0-9 Units Subcutaneous Q4H Jani Gravel, MD   1 Units at 09/18/17 [phone]  . lactated ringers infusion   Intravenous Continuous Florencia Reasons, MD 75 mL/hr at 09/19/17 0539    . levETIRAcetam (KEPPRA) 500 mg in sodium chloride 0.9 % 100 mL IVPB  500 mg Intravenous Q12H Florencia Reasons, MD   Stopped at 09/18/17 2203  . lipase/protease/amylase (CREON) capsule 12,000 Units  12,000 Units Oral TID WC Jani Gravel, MD   12,000 Units at 09/17/17 [phone]  . metoprolol tartrate (LOPRESSOR) injection 5 mg  5 mg Intravenous Q6H PRN Florencia Reasons, MD   5 mg at 09/17/17 0501  . metoprolol tartrate (LOPRESSOR) injection 5 mg  5 mg Intravenous Q4H Florencia Reasons, MD   5 mg at 09/19/17 0734  . ondansetron (ZOFRAN) tablet 4 mg  4 mg Oral Q6H PRN Jani Gravel, MD   4 mg at 09/14/17 1526  . pantoprazole (PROTONIX) EC tablet 40 mg  40 mg Oral BID Eudelia Bunch, RPH   40 mg at 09/18/17 1221  . polyethylene glycol (MIRALAX / GLYCOLAX) packet 17 g  17 g Oral Daily Florencia Reasons, MD   17 g at 09/17/17 [phone]  . senna-docusate (Senokot-S) tablet 1 tablet  1 tablet Oral BID Florencia Reasons, MD   1 tablet at 09/18/17 1220  . sertraline (ZOLOFT) tablet 50 mg  50 mg  Oral Daily Jani Gravel, MD   50 mg at 09/18/17 1219  . tamsulosin (FLOMAX) capsule 0.4 mg  0.4 mg Oral Daily Florencia Reasons, MD   0.4 mg at 09/18/17 1218     Discharge Medications: Please see discharge summary for a list of discharge medications.  Relevant Imaging Results:  Relevant Lab Results:   Additional Information ssn: [id number]  Servando Snare, LCSW

## 2017-09-20 DIAGNOSIS — R7989 Other specified abnormal findings of blood chemistry: Secondary | ICD-10-CM

## 2017-09-20 LAB — BASIC METABOLIC PANEL
Anion gap: 6 (ref 5–15)
BUN: 31 mg/dL — ABNORMAL HIGH (ref 6–20)
CHLORIDE: 114 mmol/L — AB (ref 101–111)
CO2: 18 mmol/L — AB (ref 22–32)
CREATININE: 1.59 mg/dL — AB (ref 0.61–1.24)
Calcium: 8.4 mg/dL — ABNORMAL LOW (ref 8.9–10.3)
GFR calc non Af Amer: 40 mL/min — ABNORMAL LOW (ref >60)
GFR, EST AFRICAN AMERICAN: 47 mL/min — AB (ref >60)
GLUCOSE: 101 mg/dL — AB (ref 65–99)
Potassium: 4.1 mmol/L (ref 3.5–5.1)
Sodium: 138 mmol/L (ref 135–145)

## 2017-09-20 LAB — IRON AND TIBC
IRON: 7 ug/dL — AB (ref 45–182)
Saturation Ratios: 4 % — ABNORMAL LOW (ref 17.9–39.5)
TIBC: 174 ug/dL — ABNORMAL LOW (ref 250–450)
UIBC: 167 ug/dL

## 2017-09-20 LAB — VITAMIN B12: Vitamin B-12: 1845 pg/mL — ABNORMAL HIGH (ref 180–914)

## 2017-09-20 LAB — GLUCOSE, CAPILLARY
GLUCOSE-CAPILLARY: 137 mg/dL — AB (ref 65–99)
GLUCOSE-CAPILLARY: 122 mg/dL — AB (ref 65–99)
GLUCOSE-CAPILLARY: 123 mg/dL — AB (ref 65–99)
Glucose-Capillary: 106 mg/dL — ABNORMAL HIGH (ref 65–99)
Glucose-Capillary: 115 mg/dL — ABNORMAL HIGH (ref 65–99)

## 2017-09-20 LAB — CBC
HEMATOCRIT: 32.4 % — AB (ref 39.0–52.0)
HEMOGLOBIN: 9.7 g/dL — AB (ref 13.0–17.0)
MCH: 22 pg — AB (ref 26.0–34.0)
MCHC: 29.9 g/dL — AB (ref 30.0–36.0)
MCV: 73.5 fL — AB (ref 78.0–100.0)
Platelets: 470 10*3/uL — ABNORMAL HIGH (ref 150–400)
RBC: 4.41 MIL/uL (ref 4.22–5.81)
RDW: 21.7 % — ABNORMAL HIGH (ref 11.5–15.5)
WBC: 17.9 10*3/uL — ABNORMAL HIGH (ref 4.0–10.5)

## 2017-09-20 LAB — RETICULOCYTES
RBC.: 4.45 MIL/uL (ref 4.22–5.81)
RETIC COUNT ABSOLUTE: 97.9 10*3/uL (ref 19.0–186.0)
Retic Ct Pct: 2.2 % (ref 0.4–3.1)

## 2017-09-20 LAB — PH, BODY FLUID: pH, Body Fluid: 7.5

## 2017-09-20 LAB — FOLATE: FOLATE: 48.3 ng/mL (ref >5.9)

## 2017-09-20 LAB — FERRITIN: Ferritin: 29 ng/mL (ref 24–336)

## 2017-09-20 MED ORDER — SODIUM CHLORIDE 0.9 % IV BOLUS (SEPSIS)
1000.0000 mL | Freq: Once | INTRAVENOUS | Status: AC
  Administered 2017-09-20: 1000 mL via INTRAVENOUS

## 2017-09-20 MED ORDER — METOPROLOL TARTRATE 5 MG/5ML IV SOLN
2.5000 mg | Freq: Once | INTRAVENOUS | Status: AC
  Administered 2017-09-20: 2.5 mg via INTRAVENOUS

## 2017-09-20 MED ORDER — ONDANSETRON HCL 4 MG/2ML IJ SOLN
4.0000 mg | Freq: Four times a day (QID) | INTRAMUSCULAR | Status: DC | PRN
  Administered 2017-09-20 – 2017-09-21 (×2): 4 mg via INTRAVENOUS
  Filled 2017-09-20 (×3): qty 2

## 2017-09-20 MED ORDER — ENOXAPARIN SODIUM 100 MG/ML ~~LOC~~ SOLN
1.5000 mg/kg | SUBCUTANEOUS | Status: DC
  Administered 2017-09-21: 85 mg via SUBCUTANEOUS
  Filled 2017-09-20 (×3): qty 0.85

## 2017-09-20 NOTE — Progress Notes (Signed)
Date: September 20, 2017 Velva Harman, BSN, [facility], [place] Chart and notes review for patient progress and needs. Will follow for case management and discharge needs. Next review date: [id number]

## 2017-09-20 NOTE — Progress Notes (Signed)
PROGRESS NOTE    [name]  EYC:[id number] DOB: 10-05-1940 DOA: 09/12/2017 PCP: [name], DO    Brief Narrative:   [name]  is a 76 y.o. 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      Assessment & Plan:   Principal Problem:   HCAP (healthcare-associated pneumonia) Active Problems:   Altered mental status   AKI (acute kidney injury) ([place])   Type II diabetes mellitus with neurological manifestations ([place])   Thrombocytosis (HCC)   Leukocytosis   Tachycardia   Healthcare associated pneumonia/aspiration pneumonia Patient was tachycardic with mild elevated lactic acid on admission,  and chest x-ray showing possible pneumonia.  He was started on IV vancomycin and cefepime later on transition transition to Unasyn and then to Augmentin to complete the course.   Sinus tachycardia sec to pulmonary embolism.  Patient was found to have an acute DVT in the left leg, CT angiogram showed pulmonary embolism. Echocardiogram does not show any right heart strain.  Patient was started on Lovenox and in view of his renal function his Lovenox was changed to once daily. EKG shows sinus tachycardia. He remains asymptomatic.    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  He underwent paracentesis on December 18 and 4 L were removed.  Plan for thoracentesis later today.  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    Acute on chronic kidney disease stage II/urinary retention Urology consulted for urinary retention and Foley catheter was placed. Patient's creatinine continues to worsen despite IV fluids,  Repeat renal parameters in the morning.  Continue with IV hydration. Only 450 ml of urine output this am, and none this afternoon, US renal and bladder ordered to see if  there are any obstructive causes.    Hypertension / Hypotension:  Pt's BP remains low despite mmultiple fluid boluses and he is not on any BP meds.  His MAP> 60, AND he remains asymptomatic.  Suspect he is third spacing. Keep MAP greater than 65.    Insulin-dependent diabetes Hemoglobin A1c is 10.5 Continue with sensitive sliding scale insulin. CBG (last 3)  Recent Labs    09/20/17 0830 09/20/17 1152 09/20/17 1626  GLUCAP 137* 122* 123*       CVA/seizures/dysphagia  Continue with Keppra.   History of alcohol abuse/history of erosive esophagitis/chronic pancreatitis Continue with pancrelipase.  Continue with PPI.   Severe protein calorie malnutrition Dietary consulted  History of severe major depression Continue with Zoloft and Abilify.  In view of his multiple medical problems, worsening creatinine, persistent hypotension, tachycardia, not responding to fluid boluses and pt refusing care and medications, palliative care consulted and discussed his prognosis with patient's daughter over the phone.  His daughter is getting the family together and will get back to [place] about the plan and wish to speak to palliative care tomorrow.   DVT prophylaxis: lovenox.  Code Status: full code.  Family Communication: none at bedside discussed with daughter over the phone, called his son but couldn't leave a message.   Disposition Plan: pending palliative care consult.    Consultants:   None.    Procedures: none.   Antimicrobials: none.   Subjective: Denies any new complaints. Did not answer any of my questions.   Objective: Vitals:   09/20/17 0900 09/20/17 1059 09/20/17 1316 09/20/17 1400  BP: 92/61 Marland Kitchen)  94/57 91/60 96/67   Pulse: (!) 147 (!) 137 (!) 135 93  Resp:    16  Temp:    98.3 F (36.8 C)  TempSrc:    Oral  SpO2:  93%  91%    Intake/Output Summary (Last 24 hours) at 09/20/2017 1540 Last data filed at 09/20/2017 0600 Gross per 24 hour  Intake 367.5 ml    Output 200 ml  Net 167.5 ml   There were no vitals filed for this visit.  Examination:  General exam: calm. Not in distress.   Respiratory system: diminished air entry at bases.  Cardiovascular system: S1 & S2 heard, RRR. No JVD, . pedal edema. Gastrointestinal system: abdomen is distended, soft, non tender.  Central nervous system: Alert and slow to respond to questions.  Extremities: no cyanosis or clubbing.  Skin: No rashes, lesions or ulcers Psychiatry: Mood & affect appropriate.     Data Reviewed: I have personally reviewed following labs and imaging studies  CBC: Recent Labs  Lab 09/14/17 0611 09/15/17 0625 09/17/17 0538 09/18/17 0521 09/19/17 0615 09/20/17 0549  WBC 15.7* 16.5* 14.5* 14.8* 14.5* 17.9*  NEUTROABS 12.4*  --  12.2*  --  10.9*  --   HGB 9.1* 9.5* 9.0* 8.7* 8.8* 9.7*  HCT 30.5* 32.3* 30.5* 29.7* 29.5* 32.4*  MCV 72.3* 73.6* 73.1* 73.3* 73.6* 73.5*  PLT 511* 518* 500* 472* 505* 518*   Basic Metabolic Panel: Recent Labs  Lab 09/14/17 0611  09/16/17 0621 09/17/17 0538 09/18/17 0521 09/19/17 0615 09/20/17 0549  NA 136   < > 139 139 140 140 138  K 4.1   < > 4.0 4.2 4.1 4.0 4.1  CL 106   < > 111 112* 115* 114* 114*  CO2 21*   < > 19* 19* 19* 20* 18*  GLUCOSE 125*   < > 145* 154* 121* 109* 101*  BUN 47*   < > 31* 30* 28* 30* 31*  CREATININE 1.36*   < > 1.07 1.12 1.17 1.47* 1.59*  CALCIUM 8.8*   < > 8.6* 8.9 8.9 8.6* 8.4*  MG 2.5*  --  2.0  --   --   --   --    < > = values in this interval not displayed.   GFR: Estimated Creatinine Clearance: 31.3 mL/min (A) (by C-G formula based on SCr of 1.59 mg/dL (H)). Liver Function Tests: Recent Labs  Lab 09/15/17 0625  AST 21  ALT 9*  ALKPHOS 83  BILITOT 0.5  PROT 5.8*  ALBUMIN 2.2*   Recent Labs  Lab 09/15/17 0625  LIPASE 12   Recent Labs  Lab 09/15/17 0625  AMMONIA 29   Coagulation Profile: Recent Labs  Lab 09/15/17 0603 09/16/17 0621  INR 2.62 2.04   Cardiac Enzymes: No  results for input(s): CKTOTAL, CKMB, CKMBINDEX, TROPONINI in the last 168 hours. BNP (last 3 results) No results for input(s): PROBNP in the last 8760 hours. HbA1C: No results for input(s): HGBA1C in the last 72 hours. CBG: Recent Labs  Lab 09/19/17 1634 09/19/17 2234 09/20/17 0247 09/20/17 0830 09/20/17 1152  GLUCAP 83 92 106* 137* 122*   Lipid Profile: No results for input(s): CHOL, HDL, LDLCALC, TRIG, CHOLHDL, LDLDIRECT in the last 72 hours. Thyroid Function Tests: No results for input(s): TSH, T4TOTAL, FREET4, T3FREE, THYROIDAB in the last 72 hours. Anemia Panel: Recent Labs    09/20/17 0549 09/20/17 0959  VITAMINB12  --  1,845*  FOLATE  --  48.3  FERRITIN  --  29  TIBC  --  174*  IRON  --  7*  RETICCTPCT 2.2  --    Sepsis Labs: Recent Labs  Lab 09/14/17 0258  LATICACIDVEN 1.1    Recent Results (from the past 240 hour(s))  Blood culture (routine x 2)     Status: None   Collection Time: 09/12/17  5:20 PM  Result Value Ref Range Status   Specimen Description BLOOD RIGHT ANTECUBITAL  Final   Special Requests   Final    BOTTLES DRAWN AEROBIC AND ANAEROBIC Blood Culture adequate volume   Culture   Final    NO GROWTH 5 DAYS Performed at [facility], [place] [address], [place], [place] [address]    Report Status 09/17/2017 FINAL  Final  Urine culture     Status: None   Collection Time: 09/12/17  5:42 PM  Result Value Ref Range Status   Specimen Description URINE, CATHETERIZED  Final   Special Requests NONE  Final   Culture   Final    NO GROWTH Performed at [facility], [place] [address] [place], [place] [address]    Report Status 09/14/2017 FINAL  Final  Respiratory Panel by PCR     Status: None   Collection Time: 09/12/17  9:47 PM  Result Value Ref Range Status   Adenovirus NOT DETECTED NOT DETECTED Final   Coronavirus 229E NOT DETECTED NOT DETECTED Final   Coronavirus HKU1 NOT DETECTED NOT DETECTED Final   Coronavirus NL63 NOT DETECTED NOT  DETECTED Final   Coronavirus OC43 NOT DETECTED NOT DETECTED Final   Metapneumovirus NOT DETECTED NOT DETECTED Final   Rhinovirus / Enterovirus NOT DETECTED NOT DETECTED Final   Influenza A NOT DETECTED NOT DETECTED Final   Influenza B NOT DETECTED NOT DETECTED Final   Parainfluenza Virus 1 NOT DETECTED NOT DETECTED Final   Parainfluenza Virus 2 NOT DETECTED NOT DETECTED Final   Parainfluenza Virus 3 NOT DETECTED NOT DETECTED Final   Parainfluenza Virus 4 NOT DETECTED NOT DETECTED Final   Respiratory Syncytial Virus NOT DETECTED NOT DETECTED Final   Bordetella pertussis NOT DETECTED NOT DETECTED Final   Chlamydophila pneumoniae NOT DETECTED NOT DETECTED Final   Mycoplasma pneumoniae NOT DETECTED NOT DETECTED Final    Comment: Performed at [facility], [place] [address] [place], [place] [address]  MRSA PCR Screening     Status: None   Collection Time: 09/12/17  9:47 PM  Result Value Ref Range Status   MRSA by PCR NEGATIVE NEGATIVE Final    Comment:        The GeneXpert MRSA Assay (FDA approved for NASAL specimens only), is one component of a comprehensive MRSA colonization surveillance program. It is not intended to diagnose MRSA infection nor to guide or monitor treatment for MRSA infections.   Culture, blood (routine x 2) Call MD if unable to obtain prior to antibiotics being given     Status: None   Collection Time: 09/12/17 11:07 PM  Result Value Ref Range Status   Specimen Description BLOOD RIGHT ANTECUBITAL  Final   Special Requests IN PEDIATRIC BOTTLE Blood Culture adequate volume  Final   Culture   Final    NO GROWTH 5 DAYS Performed at [facility], [place] [address], [place], [place] [address]    Report Status 09/18/2017 FINAL  Final  Culture, body fluid-bottle     Status: None (Preliminary result)   Collection Time: 09/18/17 10:31 AM  Result Value Ref Range  Status   Specimen Description PERITONEAL  Final   Special Requests NONE  Final   Culture   Final     NO GROWTH 2 DAYS Performed at [place] [address], [place], [place] [address]    Report Status PENDING  Incomplete  Gram stain     Status: None   Collection Time: 09/18/17 10:31 AM  Result Value Ref Range Status   Specimen Description PERITONEAL  Final   Special Requests NONE  Final   Gram Stain   Final    RARE WBC PRESENT, PREDOMINANTLY PMN NO ORGANISMS SEEN Performed at [facility], [place] [address] [place], [place] [address]    Report Status 09/18/2017 FINAL  Final         Radiology Studies: No results found.      Scheduled Meds: . ARIPiprazole  2 mg Oral Daily  . aspirin EC  81 mg Oral Daily  . atorvastatin  80 mg Oral q1800  . cholecalciferol  2,000 Units Oral Daily  . [START ON 09/21/2017] enoxaparin (LOVENOX) injection  1.5 mg/kg Subcutaneous Q24H  . feeding supplement (ENSURE ENLIVE)  237 mL Oral BID BM  . folic acid  1 mg Oral Daily  . insulin aspart  0-9 Units Subcutaneous Q4H  . levETIRAcetam  500 mg Oral BID  . lipase/protease/amylase  12,000 Units Oral TID WC  . metoprolol tartrate  5 mg Intravenous Q4H  . pantoprazole  40 mg Oral BID  . senna-docusate  1 tablet Oral BID  . sertraline  50 mg Oral Daily  . tamsulosin  0.4 mg Oral Daily   Continuous Infusions: . sodium chloride 75 mL/hr at 09/20/17 0926     LOS: 8 days    Time spent: 35 minutes.     Hosie Poisson, MD [facility] Pager [phone]  If 7PM-7AM, please contact night-coverage [email or web address] Password [email or web address] 09/20/2017, 3:40 PM

## 2017-09-20 NOTE — Progress Notes (Addendum)
Urine output low considering 2L bolus today. Multiple attempts to bladder scan, all revealing 0 cc. MD paged.  Another RN performed bladder scan to confirm reading of 0cc Kerry Dory RN).

## 2017-09-20 NOTE — Progress Notes (Signed)
Notified floor coverage K Schorr, NP via Amion text of slow increase of HR to 135 and lower b/p 76/62

## 2017-09-20 NOTE — Progress Notes (Addendum)
Patient refuses some pills this am. Pt spit two partially dissolved pills out. Patient refuses breakfast. Pt c/o nausea, MD made aware.

## 2017-09-20 NOTE — Progress Notes (Signed)
Akula notified of BP. Bolus ordered.

## 2017-09-21 ENCOUNTER — Inpatient Hospital Stay (HOSPITAL_COMMUNITY): Payer: Medicare Other

## 2017-09-21 DIAGNOSIS — L8990 Pressure ulcer of unspecified site, unspecified stage: Secondary | ICD-10-CM

## 2017-09-21 DIAGNOSIS — Z7189 Other specified counseling: Secondary | ICD-10-CM

## 2017-09-21 DIAGNOSIS — J90 Pleural effusion, not elsewhere classified: Secondary | ICD-10-CM

## 2017-09-21 DIAGNOSIS — Z515 Encounter for palliative care: Secondary | ICD-10-CM

## 2017-09-21 DIAGNOSIS — E1149 Type 2 diabetes mellitus with other diabetic neurological complication: Secondary | ICD-10-CM

## 2017-09-21 LAB — CREATININE, SERUM
CREATININE: 1.71 mg/dL — AB (ref 0.61–1.24)
GFR, EST AFRICAN AMERICAN: 43 mL/min — AB (ref >60)
GFR, EST NON AFRICAN AMERICAN: 37 mL/min — AB (ref >60)

## 2017-09-21 LAB — GLUCOSE, CAPILLARY
GLUCOSE-CAPILLARY: 128 mg/dL — AB (ref 65–99)
Glucose-Capillary: 109 mg/dL — ABNORMAL HIGH (ref 65–99)
Glucose-Capillary: 121 mg/dL — ABNORMAL HIGH (ref 65–99)
Glucose-Capillary: 127 mg/dL — ABNORMAL HIGH (ref 65–99)
Glucose-Capillary: 96 mg/dL (ref 65–99)
Glucose-Capillary: 98 mg/dL (ref 65–99)
Glucose-Capillary: 99 mg/dL (ref 65–99)

## 2017-09-21 MED ORDER — ENOXAPARIN SODIUM 80 MG/0.8ML ~~LOC~~ SOLN
70.0000 mg | Freq: Two times a day (BID) | SUBCUTANEOUS | Status: DC
  Administered 2017-09-22 – 2017-09-23 (×3): 70 mg via SUBCUTANEOUS
  Filled 2017-09-21 (×2): qty 0.8

## 2017-09-21 MED ORDER — DIGOXIN 0.25 MG/ML IJ SOLN
0.1250 mg | Freq: Once | INTRAMUSCULAR | Status: AC
  Administered 2017-09-21: 0.125 mg via INTRAVENOUS
  Filled 2017-09-21: qty 0.5

## 2017-09-21 MED ORDER — SODIUM CHLORIDE 0.9 % IV BOLUS (SEPSIS)
500.0000 mL | Freq: Once | INTRAVENOUS | Status: AC
  Administered 2017-09-21: 500 mL via INTRAVENOUS

## 2017-09-21 NOTE — Progress Notes (Signed)
PROGRESS NOTE    Vincent Anes.  JHE:[id number] DOB: [birth year] DOA: 09/12/2017 PCP: [name], DO    Brief Narrative:   [name]  is a 76 y.o. 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      Assessment & Plan:   Principal Problem:   HCAP (healthcare-associated pneumonia) Active Problems:   Altered mental status   AKI (acute kidney injury) ([place])   Type II diabetes mellitus with neurological manifestations ([place])   Thrombocytosis ([facility])   Leukocytosis   Tachycardia   Pressure injury of skin   Healthcare associated pneumonia/aspiration pneumonia Patient was tachycardic with mild elevated lactic acid on admission,  and chest x-ray showing possible pneumonia.  He was started on IV vancomycin and cefepime later on transition transition to Unasyn and then to Augmentin to complete the course. He does not complain of any chest pain or sob.    Sinus tachycardia sec to pulmonary embolism.  Patient was found to have an acute DVT in the left leg, CT angiogram showed pulmonary embolism. Echocardiogram does not show any right heart strain.  Patient was started on Lovenox currently getting therapeutic dose of Lovenox. EKG shows sinus tachycardia. He remains asymptomatic of his tachycardia and denies any palpitations.    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  He underwent paracentesis on December 18 and 4 L were removed.  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 Thoracentesis is still pending, family is yet to decide if they want to go ahead with thoracentesis or not.  We will wait for palliative care consult for goals of care discussion.   Acute on chronic kidney disease stage II/urinary retention Urology consulted for urinary retention and Foley catheter was  placed. Patient's creatinine continues to worsen despite IV fluids,.Urine output is minimal today, only 200 ml and US renal and bladder ordered to evaluate for obstructive causes.  Ultrasound renal negative for hydronephrosis.  Ultrasound of the bladder did not reveal any urinary retention.  The daughter at bedside is unsure about further workup for his acute on chronic kidney disease.  She would like to speak with palliative care and family members before getting back to [place] about further aggressive workup.   Hypertension / Hypotension:  Patient became lethargic hypotensive tachycardic last night requiring multiple fluid boluses to keep map greater than 65 he was ultimately transferred to stepdown for closer monitoring.  After multiple fluid boluses his blood pressure stable around 90s over 70s.  Today he is awake alert and conversant with his family members.  He is off all blood pressure medications except for metoprolol which is to be given if his heart rate is greater than 110. Suspect he is third spacing. Keep MAP greater than 65.    Insulin-dependent diabetes Hemoglobin A1c is 10.5 Continue with sensitive sliding scale insulin. CBG (last 3)  Recent Labs    09/21/17 0407 09/21/17 0738 09/21/17 1129  GLUCAP 109* 127* 99  His oral intake is minimal,  dietary consulted.  He does not report any abdominal pain nausea or vomiting at this time     CVA/seizures/dysphagia Continue with Keppra.   History of alcohol abuse/history of erosive esophagitis/chronic pancreatitis Continue with pancrelipase.  Continue with PPI.   Severe protein calorie malnutrition Dietary consulted  History of severe major depression Continue with Zoloft and Abilify.  In view of his multiple medical problems, worsening creatinine, persistent hypotension, tachycardia, not responding to fluid boluses and pt refusing care and medications, palliative care consulted and discussed his prognosis with patient's  daughter over the phone.  His daughter is getting the family together and will get back to [place] about the plan after talking to palliative care...    DVT prophylaxis: lovenox.  Code Status: full code.  Family Communication: none at bedside discussed with daughter over the phone, called his son but couldn't leave a message.   Disposition Plan: pending palliative care consult.    Consultants:   Palliative care   Procedures: none.   Antimicrobials: none.   Subjective: He is alert awake answering questions appropriately  Objective: Vitals:   09/21/17 0720 09/21/17 0800 09/21/17 1000 09/21/17 1150  BP:  94/66 126/80   Pulse:      Resp:  19 (!) 25   Temp: 97.7 F (36.5 C)   97.6 F (36.4 C)  TempSrc: Oral   Oral  SpO2:  95%    Weight:      Height:        Intake/Output Summary (Last 24 hours) at 09/21/2017 1303 Last data filed at 09/21/2017 1002 Gross per 24 hour  Intake 1102.5 ml  Output 425 ml  Net 677.5 ml   Filed Weights   09/21/17 0210  Weight: 68.1 kg (150 lb 2.1 oz)    Examination:  General exam: He is alert, calm and comfortable not in any kind of distress Respiratory system: diminished air entry at bases.  No wheezing or rhonchi Cardiovascular system: S1 & S2 heard, RRR. No JVD, . pedal edema. Gastrointestinal system: abdomen is soft nontender, distended bowel sounds are good Central nervous system: Alert and slow to respond to questions.  Extremities: no cyanosis or clubbing.  Skin: No rashes, lesions or ulcers Psychiatry: Mood & affect appropriate.     Data Reviewed: I have personally reviewed following labs and imaging studies  CBC: Recent Labs  Lab 09/15/17 0625 09/17/17 0538 09/18/17 0521 09/19/17 0615 09/20/17 0549  WBC 16.5* 14.5* 14.8* 14.5* 17.9*  NEUTROABS  --  12.2*  --  10.9*  --   HGB 9.5* 9.0* 8.7* 8.8* 9.7*  HCT 32.3* 30.5* 29.7* 29.5* 32.4*  MCV 73.6* 73.1* 73.3* 73.6* 73.5*  PLT 518* 500* 472* 505* 962*   Basic Metabolic  Panel: Recent Labs  Lab 09/16/17 0621 09/17/17 0538 09/18/17 0521 09/19/17 0615 09/20/17 0549 09/21/17 0256  NA 139 139 140 140 138  --   K 4.0 4.2 4.1 4.0 4.1  --   CL 111 112* 115* 114* 114*  --   CO2 19* 19* 19* 20* 18*  --   GLUCOSE 145* 154* 121* 109* 101*  --   BUN 31* 30* 28* 30* 31*  --   CREATININE 1.07 1.12 1.17 1.47* 1.59* 1.71*  CALCIUM 8.6* 8.9 8.9 8.6* 8.4*  --   MG 2.0  --   --   --   --   --    GFR: Estimated Creatinine Clearance: 34.8 mL/min (A) (by C-G formula based on SCr of 1.71 mg/dL (H)). Liver Function Tests: Recent Labs  Lab 09/15/17 0625  AST 21  ALT 9*  ALKPHOS 83  BILITOT 0.5  PROT 5.8*  ALBUMIN 2.2*   Recent Labs  Lab 09/15/17 0625  LIPASE 12   Recent Labs  Lab 09/15/17 0625  AMMONIA 29   Coagulation Profile: Recent Labs  Lab 09/15/17 0603 09/16/17 9528  INR 2.62 2.04   Cardiac Enzymes: No results for input(s): CKTOTAL, CKMB, CKMBINDEX, TROPONINI in the last 168 hours. BNP (last 3 results) No results for input(s): PROBNP in the last 8760 hours. HbA1C: No results for input(s): HGBA1C in the last 72 hours. CBG: Recent Labs  Lab 09/20/17 2016 09/21/17 0021 09/21/17 0407 09/21/17 0738 09/21/17 1129  GLUCAP 115* 121* 109* 127* 99   Lipid Profile: No results for input(s): CHOL, HDL, LDLCALC, TRIG, CHOLHDL, LDLDIRECT in the last 72 hours. Thyroid Function Tests: No results for input(s): TSH, T4TOTAL, FREET4, T3FREE, THYROIDAB in the last 72 hours. Anemia Panel: Recent Labs    09/20/17 0549 09/20/17 0959  VITAMINB12  --  1,845*  FOLATE  --  48.3  FERRITIN  --  29  TIBC  --  174*  IRON  --  7*  RETICCTPCT 2.2  --    Sepsis Labs: No results for input(s): PROCALCITON, LATICACIDVEN in the last 168 hours.  Recent Results (from the past 240 hour(s))  Blood culture (routine x 2)     Status: None   Collection Time: 09/12/17  5:20 PM  Result Value Ref Range Status   Specimen Description BLOOD RIGHT ANTECUBITAL  Final    Special Requests   Final    BOTTLES DRAWN AEROBIC AND ANAEROBIC Blood Culture adequate volume   Culture   Final    NO GROWTH 5 DAYS Performed at [facility], [address], [place], [place] [address]    Report Status 09/17/2017 FINAL  Final  Urine culture     Status: None   Collection Time: 09/12/17  5:42 PM  Result Value Ref Range Status   Specimen Description URINE, CATHETERIZED  Final   Special Requests NONE  Final   Culture   Final    NO GROWTH Performed at [facility], [place] [address] [place], [place] [address]    Report Status 09/14/2017 FINAL  Final  Respiratory Panel by PCR     Status: None   Collection Time: 09/12/17  9:47 PM  Result Value Ref Range Status   Adenovirus NOT DETECTED NOT DETECTED Final   Coronavirus 229E NOT DETECTED NOT DETECTED Final   Coronavirus HKU1 NOT DETECTED NOT DETECTED Final   Coronavirus NL63 NOT DETECTED NOT DETECTED Final   Coronavirus OC43 NOT DETECTED NOT DETECTED Final   Metapneumovirus NOT DETECTED NOT DETECTED Final   Rhinovirus / Enterovirus NOT DETECTED NOT DETECTED Final   Influenza A NOT DETECTED NOT DETECTED Final   Influenza B NOT DETECTED NOT DETECTED Final   Parainfluenza Virus 1 NOT DETECTED NOT DETECTED Final   Parainfluenza Virus 2 NOT DETECTED NOT DETECTED Final   Parainfluenza Virus 3 NOT DETECTED NOT DETECTED Final   Parainfluenza Virus 4 NOT DETECTED NOT DETECTED Final   Respiratory Syncytial Virus NOT DETECTED NOT DETECTED Final   Bordetella pertussis NOT DETECTED NOT DETECTED Final   Chlamydophila pneumoniae NOT DETECTED NOT DETECTED Final   Mycoplasma pneumoniae NOT DETECTED NOT DETECTED Final    Comment: Performed at [facility], [place] [address], [place], [place] [address]  MRSA PCR Screening     Status: None   Collection Time: 09/12/17  9:47 PM  Result Value Ref Range Status   MRSA by PCR NEGATIVE NEGATIVE Final    Comment:        The GeneXpert MRSA Assay (FDA approved for NASAL  specimens only), is one component of a comprehensive MRSA colonization surveillance program. It is not  intended to diagnose MRSA infection nor to guide or monitor treatment for MRSA infections.   Culture, blood (routine x 2) Call MD if unable to obtain prior to antibiotics being given     Status: None   Collection Time: 09/12/17 11:07 PM  Result Value Ref Range Status   Specimen Description BLOOD RIGHT ANTECUBITAL  Final   Special Requests IN PEDIATRIC BOTTLE Blood Culture adequate volume  Final   Culture   Final    NO GROWTH 5 DAYS Performed at [facility], [place] [address], [place], [place] [address]    Report Status 09/18/2017 FINAL  Final  Culture, body fluid-bottle     Status: None (Preliminary result)   Collection Time: 09/18/17 10:31 AM  Result Value Ref Range Status   Specimen Description PERITONEAL  Final   Special Requests NONE  Final   Culture   Final    NO GROWTH 2 DAYS Performed at [place] [address] [place], [place] [address]    Report Status PENDING  Incomplete  Gram stain     Status: None   Collection Time: 09/18/17 10:31 AM  Result Value Ref Range Status   Specimen Description PERITONEAL  Final   Special Requests NONE  Final   Gram Stain   Final    RARE WBC PRESENT, PREDOMINANTLY PMN NO ORGANISMS SEEN Performed at [facility], [place] [address] [place], [place] [address]    Report Status 09/18/2017 FINAL  Final         Radiology Studies: US Renal  Result Date: 09/21/2017 CLINICAL DATA:  Acute kidney injury. EXAM: RENAL / URINARY TRACT ULTRASOUND COMPLETE COMPARISON:  CT scan of the abdomen dated 09/17/2017 FINDINGS: Right Kidney: Length: 9.1 cm. Echogenicity within normal limits. No mass or hydronephrosis visualized. Left Kidney: Length: 10.4 cm. Echogenicity within normal limits. No solid mass or hydronephrosis visualized. 1.4 cm cyst in the mid left kidney. Bladder: Empty with Foley catheter in place. IMPRESSION: No  significant abnormality of the kidneys. Electronically Signed   By: Lorriane Shire M.D.   On: 09/21/2017 09:16        Scheduled Meds: . ARIPiprazole  2 mg Oral Daily  . aspirin EC  81 mg Oral Daily  . atorvastatin  80 mg Oral q1800  . cholecalciferol  2,000 Units Oral Daily  . enoxaparin (LOVENOX) injection  1.5 mg/kg Subcutaneous Q24H  . feeding supplement (ENSURE ENLIVE)  237 mL Oral BID BM  . folic acid  1 mg Oral Daily  . insulin aspart  0-9 Units Subcutaneous Q4H  . levETIRAcetam  500 mg Oral BID  . lipase/protease/amylase  12,000 Units Oral TID WC  . metoprolol tartrate  5 mg Intravenous Q4H  . pantoprazole  40 mg Oral BID  . senna-docusate  1 tablet Oral BID  . sertraline  50 mg Oral Daily  . tamsulosin  0.4 mg Oral Daily   Continuous Infusions: . sodium chloride 75 mL/hr at 09/21/17 0852     LOS: 9 days    Time spent: 35 minutes.     Hosie Poisson, MD [facility] Pager [phone]  If 7PM-7AM, please contact night-coverage [email or web address] Password [email or web address] 09/21/2017, 1:03 PM

## 2017-09-21 NOTE — Progress Notes (Signed)
Rapid Response Event Note  Overview:  RRT called to room 1508 for patient being hypotensive, tachycardic with a HR of 150-160 bpm, low urine output.    Initial Focused Assessment: Pt alert. Initially pt oxygen saturation was 82% on 2L [place], I recovered pt with 15L [place] for a few minutes and pt oxygen saturation quickly improved. I placed pt on 5L [place]. Pt is noted to be mouth breathing. VS: BP 102/75, HR 155, RR 16, O2 93%. Lungs are clear in the upper lobes and diminished in the lower. Pt states he doesn't feel well, but denies pain. Pulses 1+ in all four extremities. Approximately 100 cc of urine in foley bag, per RN this is all the output pt has had for this shift, and pt had low U/O on previous shift.   Interventions: 12 lead EKG: SVT 2.5 mg Digoxin given 500 cc bolus Pt HR did not decrease with Digoxin administration and fluids, per NP suggestion, pt transferred to SDU. Family updated by bedside RN.   Plan of Care (if not transferred):  Event Summary:  Casimer Bilis

## 2017-09-21 NOTE — Progress Notes (Signed)
After calling Rapid Response @ 8003 for HR =157 and lower b/p. 90/50's and O2 sat = 84% on 2lnc    X [place] NP notified via Amion and arrived at bedside 0045

## 2017-09-21 NOTE — Progress Notes (Signed)
ANTICOAGULATION CONSULT NOTE - Initial Consult  Pharmacy Consult for Lovenox Indication: pulmonary embolus   Allergies  Allergen Reactions  . Dilantin [Phenytoin Sodium Extended] Other (See Comments)    Developed Complete Heart Block following 1 dose    Patient Measurements: Height: 5\' 9"  (175.3 cm) Weight: 150 lb 2.1 oz (68.1 kg) IBW/kg (Calculated) : 70.7  Vital Signs: Temp: 97.6 F (36.4 C) (12/21 1150) Temp Source: Oral (12/21 1150) BP: 98/80 (12/21 1200)  Labs: Recent Labs    09/19/17 0615 09/20/17 0549 09/21/17 0256  HGB 8.8* 9.7*  --   HCT 29.5* 32.4*  --   PLT 505* 470*  --   CREATININE 1.47* 1.59* 1.71*    Estimated Creatinine Clearance: 34.8 mL/min (A) (by C-G formula based on SCr of 1.71 mg/dL (H)).  Medications:  Scheduled:  . ARIPiprazole  2 mg Oral Daily  . aspirin EC  81 mg Oral Daily  . atorvastatin  80 mg Oral q1800  . cholecalciferol  2,000 Units Oral Daily  . enoxaparin (LOVENOX) injection  1.5 mg/kg Subcutaneous Q24H  . feeding supplement (ENSURE ENLIVE)  237 mL Oral BID BM  . folic acid  1 mg Oral Daily  . insulin aspart  0-9 Units Subcutaneous Q4H  . levETIRAcetam  500 mg Oral BID  . lipase/protease/amylase  12,000 Units Oral TID WC  . metoprolol tartrate  5 mg Intravenous Q4H  . pantoprazole  40 mg Oral BID  . senna-docusate  1 tablet Oral BID  . sertraline  50 mg Oral Daily  . tamsulosin  0.4 mg Oral Daily    Assessment: 76 yoM on Lovenox 1.5 mg/kg SQ q24h for treatment of acute DVT and PE. Weight 68 kg SCr rising, currently 1.7 with CrCl ~ 35 ml/min CBC (12/20): Hgb low/stable at 9.7, Plt elevated NO plans for thoracentesis.  Goal of Therapy:  Monitor platelets by anticoagulation protocol: Yes   Plan:   Enoxaparin 1 mg/kg SQ BID - Next dose starting on 12/22 AM  Follow up renal function closely and reduce to q24h when CrCl < 30 ml/hr  Follow up plans for palliative care consult   Gretta Arab PharmD, BCPS Pager  [phone] 09/21/2017 2:31 PM

## 2017-09-21 NOTE — Progress Notes (Signed)
Called by RN and RR concerning decline in patient mental status, sats, hypotension, and tachycardia. On arrival, patient resting. No response to verbal stimuli but does respond to to physical touch. Patient BP are soft with SBP in low 90's, HR in 150's Review of chart reveals severity of patient condition and progressive  decline d/t multiple diagnoses. Patient transferred to SDU for closer monitoring.   Assessment/plan: Hypotension:  Will continue to monitor. No intervention if MAP 60 or greater  Tachycardia: One time dose of digoxin given.  NS IVF bolus given Can give metoprolol if SBP greater than 110. Transferred to SDU.   Discussed prognosis with daughter. She agrees at this time to make patient DNR. Still waiting for additional family members to discuss palliative care for patient.

## 2017-09-21 NOTE — Progress Notes (Signed)
Patient is admitted to [facility] from [facility]. Admission VS  is stable and patient is AOx3. No family is at the bedside.

## 2017-09-21 NOTE — Progress Notes (Signed)
Palliative care progress note  Reason for consult: Goals of care in light of multiple comorbid conditions now with ascitic fluid with poorly differentiated carcinoma  I saw and examined [name] today.  He was awake and alert, however, he had some difficulty in forming his thoughts and I had some difficulty in understanding exactly what he was trying to say.  He was able to let me know that he is having heartburn.  He also requested specifically to have a cigarette.    I asked his nurse to give him his as needed Mylanta.  I called and spoke with his daughter, [name].  She reports that she has been talking with Dr. Karleen Hampshire regarding his continued complications and decline.  Will begin discussion of pathways moving forward including continuation of aggressive care versus refocusing more on his comfort.  She  was receptive to this conversation, however, she reports that the patient's sister is arriving in town this evening.  She would like to wait until tomorrow in order to make any decisions about moving forward.  We have set up a family meeting for tomorrow at 10 AM.  Total time: 30 minutes Greater than 50%  of this time was spent counseling and coordinating care related to the above assessment and plan. Micheline Rough, MD [place] Team [phone]

## 2017-09-22 ENCOUNTER — Inpatient Hospital Stay (HOSPITAL_COMMUNITY): Payer: Medicare Other

## 2017-09-22 ENCOUNTER — Other Ambulatory Visit: Payer: Self-pay

## 2017-09-22 LAB — GLUCOSE, CAPILLARY
GLUCOSE-CAPILLARY: 115 mg/dL — AB (ref 65–99)
GLUCOSE-CAPILLARY: 130 mg/dL — AB (ref 65–99)
GLUCOSE-CAPILLARY: 120 mg/dL — AB (ref 65–99)
Glucose-Capillary: 117 mg/dL — ABNORMAL HIGH (ref 65–99)
Glucose-Capillary: 125 mg/dL — ABNORMAL HIGH (ref 65–99)

## 2017-09-22 LAB — CREATININE, SERUM
Creatinine, Ser: 1.81 mg/dL — ABNORMAL HIGH (ref 0.61–1.24)
GFR, EST AFRICAN AMERICAN: 40 mL/min — AB (ref >60)
GFR, EST NON AFRICAN AMERICAN: 34 mL/min — AB (ref >60)

## 2017-09-22 NOTE — Progress Notes (Signed)
PROGRESS NOTE    [name]  YYT:[id number] DOB: October 31, 1940 DOA: 09/12/2017 PCP: [name], DO    Brief Narrative:   [name]  is a 76 y.o. 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      Assessment & Plan:   Principal Problem:   HCAP (healthcare-associated pneumonia) Active Problems:   Altered mental status   AKI (acute kidney injury) ([place])   Type II diabetes mellitus with neurological manifestations ([place])   Thrombocytosis (HCC)   Leukocytosis   Tachycardia   Pressure injury of skin   Healthcare associated pneumonia/aspiration pneumonia Patient was tachycardic with mild elevated lactic acid on admission,  and chest x-ray showing possible pneumonia.  He was started on IV vancomycin and cefepime later on transition transition to Unasyn and then to Augmentin to complete the course. He does not complain of any chest pain or sob.    Sinus tachycardia sec to pulmonary embolism.  Patient was found to have an acute DVT in the left leg, CT angiogram showed pulmonary embolism. Echocardiogram does not show any right heart strain.  Patient was started on Lovenox currently getting therapeutic dose of Lovenox. EKG shows sinus tachycardia. He remains asymptomatic of his tachycardia and denies any palpitations. No new complaints.    Acute DVT in the left leg Patient is on Lovenox, therapeutic dose.   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  He underwent paracentesis on December 18 and 4 L were removed.  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 Thoracentesis is still pending, family is yet to decide if they want to go ahead with thoracentesis or not.  We will wait for palliative care consult for goals of care discussion.   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  Urology consulted for urinary retention and Foley catheter was placed. Patient's creatinine continues to worsen despite IV fluids,.Urine output is minimal today, only 200 ml and US renal and bladder ordered to evaluate for obstructive causes.  Ultrasound renal negative for hydronephrosis.  Ultrasound of the bladder did not reveal any urinary retention.  The daughter at bedside is unsure about further workup for his acute on chronic kidney disease. Discussed with Dr Joelyn Oms regarding his renal insufficiency, recommended no further work up at this time. Continue with IV hydration and monitor.   Hypertension / Hypotension:  Patient became lethargic hypotensive tachycardic last night requiring multiple fluid boluses to keep map greater than 65 he was ultimately transferred to stepdown for closer monitoring.  After multiple fluid boluses his blood pressure stable around 90s over 70s.  Today he is awake alert and conversant with his family members.  He is off all blood pressure medications except for metoprolol which is to be given if his heart rate is greater than 110. Suspect he is third spacing. Keep MAP greater than 65.    Insulin-dependent diabetes Hemoglobin A1c is 10.5 Continue with sensitive sliding scale insulin. CBG (last 3)  Recent Labs    09/22/17 0841 09/22/17 1123 09/22/17 1548  GLUCAP 117* 125* 130*  His oral intake is minimal,  dietary consulted.  He does not report any abdominal pain nausea or vomiting at this time     CVA/seizures/dysphagia Continue with Keppra.   History of alcohol abuse/history of erosive esophagitis/chronic  pancreatitis Continue with pancrelipase.  Continue with PPI.   Severe protein calorie malnutrition Dietary consulted and recommendations given.   History of severe major depression Continue with Zoloft and Abilify.  In view of his multiple medical  problems, worsening creatinine, persistent hypotension, tachycardia, not responding to fluid boluses and pt refusing care and medications, palliative care consulted and discussed his prognosis with patient's daughter over the phone.  Palliative care meeting today, pt's family to meet again tomorrow and give [place] final decisions regarding aggressive intervention vs comfort care.     DVT prophylaxis: lovenox.  Code Status: full code.  Family Communication: none at bedside today.  Disposition Plan: pending palliative care consult. Possibly back to SNF vs residential hospice, depending on the outcome of the palliative care meeting tomorrow.   Consultants:   Palliative care   Procedures: none.   Antimicrobials: none.   Subjective: No new complaitns, appears comfortable. But confused.   Objective: Vitals:   09/22/17 0003 09/22/17 0510 09/22/17 1206 09/22/17 1522  BP: 111/67 111/68 112/78 116/72  Pulse: 81 89 92 (!) 104  Resp:  20  20  Temp:  98.1 F (36.7 C)  98 F (36.7 C)  TempSrc:  Axillary  Oral  SpO2:  96%  97%  Weight:      Height:        Intake/Output Summary (Last 24 hours) at 09/22/2017 1613 Last data filed at 09/22/2017 1544 Gross per 24 hour  Intake 1418.75 ml  Output 200 ml  Net 1218.75 ml   Filed Weights   09/21/17 0210  Weight: 68.1 kg (150 lb 2.1 oz)    Examination:  General exam: He is alert, calm and comfortable not in any kind of distress Respiratory system: Air entry fair no wheezing or rhonchi Cardiovascular system: S1 & S2 heard, RRR. No JVD, . pedal edema. Gastrointestinal system: abdomen is soft nontender, nondistended bowel sounds are good Central nervous system: Alert and slow to respond to questions.  Patient appears to be confused Extremities: no cyanosis or clubbing.  Skin: No rashes, lesions or ulcers Psychiatry: Mood & affect appropriate.  No agitation    Data Reviewed: I have personally reviewed following labs and imaging  studies  CBC: Recent Labs  Lab 09/17/17 0538 09/18/17 0521 09/19/17 0615 09/20/17 0549  WBC 14.5* 14.8* 14.5* 17.9*  NEUTROABS 12.2*  --  10.9*  --   HGB 9.0* 8.7* 8.8* 9.7*  HCT 30.5* 29.7* 29.5* 32.4*  MCV 73.1* 73.3* 73.6* 73.5*  PLT 500* 472* 505* 381*   Basic Metabolic Panel: Recent Labs  Lab 09/16/17 0621 09/17/17 0538 09/18/17 0521 09/19/17 0615 09/20/17 0549 09/21/17 0256 09/22/17 0535  NA 139 139 140 140 138  --   --   K 4.0 4.2 4.1 4.0 4.1  --   --   CL 111 112* 115* 114* 114*  --   --   CO2 19* 19* 19* 20* 18*  --   --   GLUCOSE 145* 154* 121* 109* 101*  --   --   BUN 31* 30* 28* 30* 31*  --   --   CREATININE 1.07 1.12 1.17 1.47* 1.59* 1.71* 1.81*  CALCIUM 8.6* 8.9 8.9 8.6* 8.4*  --   --   MG 2.0  --   --   --   --   --   --    GFR: Estimated Creatinine Clearance: 32.9 mL/min (A) (by C-G formula based on SCr of 1.81 mg/dL (H)). Liver Function Tests: No  results for input(s): AST, ALT, ALKPHOS, BILITOT, PROT, ALBUMIN in the last 168 hours. No results for input(s): LIPASE, AMYLASE in the last 168 hours. No results for input(s): AMMONIA in the last 168 hours. Coagulation Profile: Recent Labs  Lab 09/16/17 0621  INR 2.04   Cardiac Enzymes: No results for input(s): CKTOTAL, CKMB, CKMBINDEX, TROPONINI in the last 168 hours. BNP (last 3 results) No results for input(s): PROBNP in the last 8760 hours. HbA1C: No results for input(s): HGBA1C in the last 72 hours. CBG: Recent Labs  Lab 09/22/17 0002 09/22/17 0519 09/22/17 0841 09/22/17 1123 09/22/17 1548  GLUCAP 128* 115* 117* 125* 130*   Lipid Profile: No results for input(s): CHOL, HDL, LDLCALC, TRIG, CHOLHDL, LDLDIRECT in the last 72 hours. Thyroid Function Tests: No results for input(s): TSH, T4TOTAL, FREET4, T3FREE, THYROIDAB in the last 72 hours. Anemia Panel: Recent Labs    09/20/17 0549 09/20/17 0959  VITAMINB12  --  1,845*  FOLATE  --  48.3  FERRITIN  --  29  TIBC  --  174*  IRON   --  7*  RETICCTPCT 2.2  --    Sepsis Labs: No results for input(s): PROCALCITON, LATICACIDVEN in the last 168 hours.  Recent Results (from the past 240 hour(s))  Blood culture (routine x 2)     Status: None   Collection Time: 09/12/17  5:20 PM  Result Value Ref Range Status   Specimen Description BLOOD RIGHT ANTECUBITAL  Final   Special Requests   Final    BOTTLES DRAWN AEROBIC AND ANAEROBIC Blood Culture adequate volume   Culture   Final    NO GROWTH 5 DAYS Performed at [facility], [address], [place], [place] [address]    Report Status 09/17/2017 FINAL  Final  Urine culture     Status: None   Collection Time: 09/12/17  5:42 PM  Result Value Ref Range Status   Specimen Description URINE, CATHETERIZED  Final   Special Requests NONE  Final   Culture   Final    NO GROWTH Performed at [facility], [place] [address] [place], [place] [address]    Report Status 09/14/2017 FINAL  Final  Respiratory Panel by PCR     Status: None   Collection Time: 09/12/17  9:47 PM  Result Value Ref Range Status   Adenovirus NOT DETECTED NOT DETECTED Final   Coronavirus 229E NOT DETECTED NOT DETECTED Final   Coronavirus HKU1 NOT DETECTED NOT DETECTED Final   Coronavirus NL63 NOT DETECTED NOT DETECTED Final   Coronavirus OC43 NOT DETECTED NOT DETECTED Final   Metapneumovirus NOT DETECTED NOT DETECTED Final   Rhinovirus / Enterovirus NOT DETECTED NOT DETECTED Final   Influenza A NOT DETECTED NOT DETECTED Final   Influenza B NOT DETECTED NOT DETECTED Final   Parainfluenza Virus 1 NOT DETECTED NOT DETECTED Final   Parainfluenza Virus 2 NOT DETECTED NOT DETECTED Final   Parainfluenza Virus 3 NOT DETECTED NOT DETECTED Final   Parainfluenza Virus 4 NOT DETECTED NOT DETECTED Final   Respiratory Syncytial Virus NOT DETECTED NOT DETECTED Final   Bordetella pertussis NOT DETECTED NOT DETECTED Final   Chlamydophila pneumoniae NOT DETECTED NOT DETECTED Final   Mycoplasma pneumoniae NOT  DETECTED NOT DETECTED Final    Comment: Performed at [facility], [place] [address], [place], [place] [address]  MRSA PCR Screening     Status: None   Collection Time: 09/12/17  9:47 PM  Result Value Ref Range  Status   MRSA by PCR NEGATIVE NEGATIVE Final    Comment:        The GeneXpert MRSA Assay (FDA approved for NASAL specimens only), is one component of a comprehensive MRSA colonization surveillance program. It is not intended to diagnose MRSA infection nor to guide or monitor treatment for MRSA infections.   Culture, blood (routine x 2) Call MD if unable to obtain prior to antibiotics being given     Status: None   Collection Time: 09/12/17 11:07 PM  Result Value Ref Range Status   Specimen Description BLOOD RIGHT ANTECUBITAL  Final   Special Requests IN PEDIATRIC BOTTLE Blood Culture adequate volume  Final   Culture   Final    NO GROWTH 5 DAYS Performed at [facility], [place] [address] [place], [place] [address]    Report Status 09/18/2017 FINAL  Final  Culture, body fluid-bottle     Status: None (Preliminary result)   Collection Time: 09/18/17 10:31 AM  Result Value Ref Range Status   Specimen Description PERITONEAL  Final   Special Requests NONE  Final   Culture   Final    NO GROWTH 4 DAYS Performed at [place] [facility] [address] [place], [place] [address]    Report Status PENDING  Incomplete  Gram stain     Status: None   Collection Time: 09/18/17 10:31 AM  Result Value Ref Range Status   Specimen Description PERITONEAL  Final   Special Requests NONE  Final   Gram Stain   Final    RARE WBC PRESENT, PREDOMINANTLY PMN NO ORGANISMS SEEN Performed at [facility], [place] [address] [place], [place] [address]    Report Status 09/18/2017 FINAL  Final         Radiology Studies: US Renal  Result Date: 09/21/2017 CLINICAL DATA:  Acute kidney injury. EXAM: RENAL / URINARY TRACT ULTRASOUND COMPLETE COMPARISON:  CT scan of the abdomen dated  09/17/2017 FINDINGS: Right Kidney: Length: 9.1 cm. Echogenicity within normal limits. No mass or hydronephrosis visualized. Left Kidney: Length: 10.4 cm. Echogenicity within normal limits. No solid mass or hydronephrosis visualized. 1.4 cm cyst in the mid left kidney. Bladder: Empty with Foley catheter in place. IMPRESSION: No significant abnormality of the kidneys. Electronically Signed   By: Lorriane Shire M.D.   On: 09/21/2017 09:16        Scheduled Meds: . ARIPiprazole  2 mg Oral Daily  . aspirin EC  81 mg Oral Daily  . atorvastatin  80 mg Oral q1800  . cholecalciferol  2,000 Units Oral Daily  . enoxaparin (LOVENOX) injection  70 mg Subcutaneous Q12H  . feeding supplement (ENSURE ENLIVE)  237 mL Oral BID BM  . folic acid  1 mg Oral Daily  . insulin aspart  0-9 Units Subcutaneous Q4H  . levETIRAcetam  500 mg Oral BID  . lipase/protease/amylase  12,000 Units Oral TID WC  . metoprolol tartrate  5 mg Intravenous Q4H  . pantoprazole  40 mg Oral BID  . senna-docusate  1 tablet Oral BID  . sertraline  50 mg Oral Daily  . tamsulosin  0.4 mg Oral Daily   Continuous Infusions: . sodium chloride 100 mL/hr at 09/22/17 1201     LOS: 10 days    Time spent: 35 minutes.     Hosie Poisson, MD [facility] Pager [phone]  If 7PM-7AM, please contact night-coverage [email or web address] Password [facility] 09/22/2017, 4:13 PM

## 2017-09-22 NOTE — Progress Notes (Signed)
Patient had one episode of emesis after taking 10am medications. Prn nausea medication administered, will continue to monitor.  [name]. Brigitte Pulse, RN

## 2017-09-22 NOTE — Progress Notes (Signed)
Palliative care progress note  Reason for consult: Goals of care in light of multiple comorbid conditions now with ascitic fluid with poorly differentiated carcinoma  I saw and examined [name] today.  He was awake and alert, however, I continued to have difficulty in understanding exactly what he was trying to say.    I met with his family outside of the room.  This included his 2 daughters, his son, his sister, and a niece.  Values and goals of care important to patient and family were attempted to be elicited.  His family report that the most important things to him have always been his family and being "the life of the party."  He does have a history of significant alcohol use in the past.  We discussed clinical course over the past few weeks/months including his loss of appetite and functional status, his potential aspiration pneumonia, renal failure, discovery of DVT/PE as well as malignant ascites with cytology revealing poorly differentiated carcinoma and imaging consistent with spread to liver and omentum.  It remains difficult to tell if [name] is able to understand what is going on with his medical care, and his family seems to indicate that they do not think that he understands the complexity of the situation.  I believe this is the case as well.  We discussed difference between a aggressive medical intervention path and a palliative, comfort focused care path in light of his newly discovered metastatic disease.  We discussed what his wishes would be if he truly understood his situation.  His family indicates that they think that if he were to understand what is going on (newly diagnosed metastatic disease with poor functional status and unlikely to benefit from disease modifying therapy), his wish would be to focus on comfort and enjoying time with his family outside of the hospital.  We began discussion on how hospice could benefit him if this is the goal moving forward.  Concept of  Hospice and Palliative Care were discussed  Questions and concerns addressed.   PMT will continue to support holistically.  -Family would like to have nephrology weigh in on his acute renal failure -His family does not think he would understand regarding diagnosis of cancer.  We will continue to discuss tonight how best to go about sharing as much information as Mr. Palazzi desires regarding his current situation. -We discussed continued aggressive interventions, including potential for biopsy, versus focusing on comfort and transitioning back to [place] with the assistance of hospice.  Family to discuss this further this evening. -We have set up a follow-up family meeting for tomorrow at 10 AM.  Total time: 70 minutes  Extended billing time  Greater than 50%  of this time was spent counseling and coordinating care related to the above assessment and plan.  Micheline Rough, MD [place] Team [phone]

## 2017-09-22 NOTE — Progress Notes (Addendum)
Patient vomited a very large amount of milky vomit with probable aspiration- pt recently drank an Ensure. 125 ml was suctioned with [place] and a very large amount went in bed also. Pt in no distress at this time, VSS. Continuous pulse ox applied and MD notified. No further vomiting at this time. Pt also had a small, soft, brown incontinent BM. Will continue to monitor closely.

## 2017-09-23 LAB — CULTURE, BODY FLUID-BOTTLE

## 2017-09-23 LAB — GLUCOSE, CAPILLARY
GLUCOSE-CAPILLARY: 126 mg/dL — AB (ref 65–99)
GLUCOSE-CAPILLARY: 128 mg/dL — AB (ref 65–99)
GLUCOSE-CAPILLARY: 130 mg/dL — AB (ref 65–99)
GLUCOSE-CAPILLARY: 74 mg/dL (ref 65–99)
GLUCOSE-CAPILLARY: 97 mg/dL (ref 65–99)
Glucose-Capillary: 118 mg/dL — ABNORMAL HIGH (ref 65–99)
Glucose-Capillary: 129 mg/dL — ABNORMAL HIGH (ref 65–99)

## 2017-09-23 LAB — CBC
HEMATOCRIT: 29.4 % — AB (ref 39.0–52.0)
Hemoglobin: 8.8 g/dL — ABNORMAL LOW (ref 13.0–17.0)
MCH: 21.7 pg — ABNORMAL LOW (ref 26.0–34.0)
MCHC: 29.9 g/dL — ABNORMAL LOW (ref 30.0–36.0)
MCV: 72.6 fL — AB (ref 78.0–100.0)
PLATELETS: 524 10*3/uL — AB (ref 150–400)
RBC: 4.05 MIL/uL — AB (ref 4.22–5.81)
RDW: 22.3 % — AB (ref 11.5–15.5)
WBC: 16.2 10*3/uL — AB (ref 4.0–10.5)

## 2017-09-23 LAB — CREATININE, SERUM
CREATININE: 1.98 mg/dL — AB (ref 0.61–1.24)
GFR calc non Af Amer: 31 mL/min — ABNORMAL LOW (ref >60)
GFR, EST AFRICAN AMERICAN: 36 mL/min — AB (ref >60)

## 2017-09-23 LAB — CULTURE, BODY FLUID W GRAM STAIN -BOTTLE: Culture: NO GROWTH

## 2017-09-23 MED ORDER — ENOXAPARIN SODIUM 80 MG/0.8ML ~~LOC~~ SOLN
70.0000 mg | SUBCUTANEOUS | Status: DC
  Administered 2017-09-24: 08:00:00 70 mg via SUBCUTANEOUS
  Filled 2017-09-23: qty 0.8

## 2017-09-23 NOTE — Progress Notes (Signed)
Palliative care progress note  Reason for consult: Goals of care in light of multiple comorbid conditions now with ascitic fluid with poorly differentiated carcinoma  I saw and examined Mr. Veley today.  He was awake and alert, however, I continued to have difficulty in understanding exactly what he was trying to say.  He appears weaker and confused today.  RN reports that he has been talking about a baby in his room (none present).  His daughter, Kalman Shan, reports that he has been seeing other things that are not there as well.     I met with his family outside of the room.  This again included his 2 daughters, his son, his sister, and a niece.    We discussed again the difference between a aggressive medical intervention path and a palliative, comfort focused care path in light of his newly discovered metastatic disease, continued clinical decline, and poor PO intake.  His family indicates that they think that if he were to understand what is going on (newly diagnosed metastatic disease with poor functional status and unlikely to benefit from disease modifying therapy), his wish would be to focus on comfort and enjoying time with his family back at [facility], which has been his home for the last several months.   - Family desires transition back to [place] (he is long term care resident there) with the support of hospice.  He is intermittently confused and family is worried that talking about hospice will upset him.  In my conversation with him, we discussed him having medical problems that are not going to get better (I did not specifically say cancer per family request) and suggested going back to [place] with a focus on feeling as well as possible and spending time with family.  He agrees that being at [employer] and feeling as well as possible are his desires.  Family requested not to tell him that he is getting hospice services as he is confused and this may upset him.  Discussed with SW.  Will  discuss with Dr. Charlies Silvers.  Total time: 50 minutes Greater than 50%  of this time was spent counseling and coordinating care related to the above assessment and plan.  Micheline Rough, MD [place] Team [phone]

## 2017-09-23 NOTE — Progress Notes (Signed)
Pt unable to tolerate anything by mouth, continues to throw up anything that he's tried to get down. RN tried to give a little orange juice this morning, patient immediately threw back up. RN & NT put patients head down for 30 seconds to turn patient off of his butt and immediately he started choking and threw up about 200 cc that RN suctioned up. RN has told family that patient is unable to tolerate and to please hold off from giving patient food/drink due to his aspiration precautions. RN will continue to monitor patient.  Carmela Hurt, RN

## 2017-09-23 NOTE — Progress Notes (Signed)
ANTICOAGULATION CONSULT NOTE - Follow Up  Pharmacy Consult for Lovenox Indication: pulmonary embolus   Allergies  Allergen Reactions  . Dilantin [Phenytoin Sodium Extended] Other (See Comments)    Developed Complete Heart Block following 1 dose    Patient Measurements: Height: 5\' 9"  (175.3 cm) Weight: 150 lb 2.1 oz (68.1 kg) IBW/kg (Calculated) : 70.7  Vital Signs: Temp: 98.1 F (36.7 C) (12/23 0413) Temp Source: Axillary (12/23 0413) BP: 119/78 (12/23 0413) Pulse Rate: 94 (12/23 0413)  Labs: Recent Labs    09/21/17 0256 09/22/17 0535 09/23/17 0605  HGB  --   --  8.8*  HCT  --   --  29.4*  PLT  --   --  524*  CREATININE 1.71* 1.81* 1.98*    Estimated Creatinine Clearance: 30.1 mL/min (A) (by C-G formula based on SCr of 1.98 mg/dL (H)).  Medications:  Scheduled:  . ARIPiprazole  2 mg Oral Daily  . aspirin EC  81 mg Oral Daily  . atorvastatin  80 mg Oral q1800  . cholecalciferol  2,000 Units Oral Daily  . enoxaparin (LOVENOX) injection  70 mg Subcutaneous Q12H  . feeding supplement (ENSURE ENLIVE)  237 mL Oral BID BM  . folic acid  1 mg Oral Daily  . insulin aspart  0-9 Units Subcutaneous Q4H  . levETIRAcetam  500 mg Oral BID  . lipase/protease/amylase  12,000 Units Oral TID WC  . metoprolol tartrate  5 mg Intravenous Q4H  . pantoprazole  40 mg Oral BID  . senna-docusate  1 tablet Oral BID  . sertraline  50 mg Oral Daily  . tamsulosin  0.4 mg Oral Daily    Assessment: 76 yoM on Lovenox 1.5 mg/kg SQ q24h for treatment of acute DVT and PE. Weight 68 kg SCr continues to rise, currently 1.98 with CrCl ~ 30 ml/min CBC (12/20): Hgb low/stable, Plt elevated   Goal of Therapy:  Monitor platelets by anticoagulation protocol: Yes   Plan:   Reduce Lovenox from 1mg /kg SQ q12 to 1mg /kg SQ q24 due to CrCl of 30 and continues rising of SCr  Follow up renal function closely and reduce to q24h when CrCl < 30 ml/hr  Follow up plans for palliative care  consult   Adrian Saran, PharmD, BCPS Pager [phone] 09/23/2017 10:36 AM

## 2017-09-23 NOTE — Progress Notes (Signed)
Patient ID: Vincent Dayal., male   DOB: 04-Jun-1941, 76 y.o.   MRN: [id number]  PROGRESS NOTE    Vincent Anes.  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    Assessment & Plan:  Healthcare associated pneumonia/aspiration pneumonia - Patient was tachycardic with mild elevated lactic acid on admission,  and chest x-ray showing possible pneumonia.  He was started on IV vancomycin and cefepime later on transition transition to Unasyn and then to Augmentin to complete the course - Stable resp status   Sinus tachycardia sec to pulmonary embolism.  - Patient was found to have an acute DVT in the left leg, CT angiogram showed pulmonary embolism. - Echocardiogram does not show any right heart strain.  Patient was started on Lovenox currently getting therapeutic dose of Lovenox. 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  He underwent paracentesis on December 18 and 4 L were removed.  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 - Palliative following, appreciate their assistance  Acute on chronic kidney disease stage II/urinary retention - Ultrasound renal negative for hydronephrosis.  Ultrasound of the bladder did not reveal any urinary retention - Cr continues to trend up  Hypertension / Hypotension - Patient became lethargic hypotensive tachycardic last night requiring multiple fluid boluses to keep map greater than 65 he was ultimately transferred to stepdown for closer monitoring.  After multiple fluid boluses his blood pressure stable around 90s over 70s.  Today he is awake alert and conversant with his family members.  He  is off all blood pressure medications except for metoprolol which is to be given if his heart rate is greater than 110. Suspect he is third spacing. Keep MAP greater than 65 - Monitor on telemetry   Insulin-dependent diabetes - Hemoglobin A1c is 10.5 - Continue SSI  CVA/seizures/dysphagia - Continue Keppra   History of alcohol abuse/history of erosive esophagitis/chronic pancreatitis - Continue pancreatic lipase supplementation   Severe protein calorie malnutrition - Continue current diet  History of severe major depression - Stable     DVT prophylaxis: Lovenox subQ Code Status: DNR/DNI Family Communication: family at the bedside  Disposition Plan: d/c in next 24-48 hours    Consultants:   Palliative care   Procedures:   None   Antimicrobials:   None   Subjective: No overnight events.  Objective: Vitals:   09/22/17 1522 09/22/17 2031 09/23/17 0055 09/23/17 0413  BP: 116/72 105/82 117/84 119/78  Pulse: (!) 104 99 98 94  Resp: 20 (!) 22 20 18   Temp: 98 F (36.7 C) 97.7 F (36.5 C)  98.1 F (36.7 C)  TempSrc: Oral Axillary  Axillary  SpO2: 97% 97% 98% 100%  Weight:      Height:        Intake/Output Summary (Last 24 hours) at 09/23/2017 1323 Last data filed at 09/23/2017 0700 Gross per 24 hour  Intake 2080.83 ml  Output 200 ml  Net 1880.83 ml   Filed Weights   09/21/17 0210  Weight: 68.1 kg (150 lb 2.1 oz)    Examination:  General exam: Appears calm and comfortable  Respiratory system: Clear to auscultation. Respiratory effort normal. Cardiovascular  system: S1 & S2 heard, Rate controlled  Gastrointestinal system: Abdomen is nondistended, soft and nontender. No organomegaly or masses felt. Normal bowel sounds heard. Central nervous system: No focal neurological deficits. Extremities: Symmetric 5 x 5 power. Skin: No rashes, lesions or ulcers Psychiatry: Judgement and insight appear normal. Mood & affect appropriate.   Data Reviewed: I  have personally reviewed following labs and imaging studies  CBC: Recent Labs  Lab 09/17/17 0538 09/18/17 0521 09/19/17 0615 09/20/17 0549 09/23/17 0605  WBC 14.5* 14.8* 14.5* 17.9* 16.2*  NEUTROABS 12.2*  --  10.9*  --   --   HGB 9.0* 8.7* 8.8* 9.7* 8.8*  HCT 30.5* 29.7* 29.5* 32.4* 29.4*  MCV 73.1* 73.3* 73.6* 73.5* 72.6*  PLT 500* 472* 505* 470* 161*   Basic Metabolic Panel: Recent Labs  Lab 09/17/17 0538 09/18/17 0521 09/19/17 0615 09/20/17 0549 09/21/17 0256 09/22/17 0535 09/23/17 0605  NA 139 140 140 138  --   --   --   K 4.2 4.1 4.0 4.1  --   --   --   CL 112* 115* 114* 114*  --   --   --   CO2 19* 19* 20* 18*  --   --   --   GLUCOSE 154* 121* 109* 101*  --   --   --   BUN 30* 28* 30* 31*  --   --   --   CREATININE 1.12 1.17 1.47* 1.59* 1.71* 1.81* 1.98*  CALCIUM 8.9 8.9 8.6* 8.4*  --   --   --    GFR: Estimated Creatinine Clearance: 30.1 mL/min (A) (by C-G formula based on SCr of 1.98 mg/dL (H)). Liver Function Tests: No results for input(s): AST, ALT, ALKPHOS, BILITOT, PROT, ALBUMIN in the last 168 hours. No results for input(s): LIPASE, AMYLASE in the last 168 hours. No results for input(s): AMMONIA in the last 168 hours. Coagulation Profile: No results for input(s): INR, PROTIME in the last 168 hours. Cardiac Enzymes: No results for input(s): CKTOTAL, CKMB, CKMBINDEX, TROPONINI in the last 168 hours. BNP (last 3 results) No results for input(s): PROBNP in the last 8760 hours. HbA1C: No results for input(s): HGBA1C in the last 72 hours. CBG: Recent Labs  Lab 09/22/17 2050 09/23/17 0043 09/23/17 0411 09/23/17 0800 09/23/17 1135  GLUCAP 120* 129* 97 126* 130*   Lipid Profile: No results for input(s): CHOL, HDL, LDLCALC, TRIG, CHOLHDL, LDLDIRECT in the last 72 hours. Thyroid Function Tests: No results for input(s): TSH, T4TOTAL, FREET4, T3FREE, THYROIDAB in the last 72 hours. Anemia Panel: No results for input(s): VITAMINB12, FOLATE, FERRITIN,  TIBC, IRON, RETICCTPCT in the last 72 hours. Urine analysis:    Component Value Date/Time   COLORURINE YELLOW 09/12/2017 1742   APPEARANCEUR CLEAR 09/12/2017 1742   LABSPEC 1.015 09/12/2017 1742   PHURINE 5.0 09/12/2017 1742   GLUCOSEU NEGATIVE 09/12/2017 1742   HGBUR NEGATIVE 09/12/2017 1742   BILIRUBINUR NEGATIVE 09/12/2017 1742   KETONESUR NEGATIVE 09/12/2017 1742   PROTEINUR NEGATIVE 09/12/2017 1742   UROBILINOGEN 0.2 10/01/2014 1818   NITRITE NEGATIVE 09/12/2017 1742   LEUKOCYTESUR NEGATIVE 09/12/2017 1742   Sepsis Labs: @LABRCNTIP (procalcitonin:4,lacticidven:4)    Recent Results (from the past 240 hour(s))  Culture, body fluid-bottle     Status: None (Preliminary result)   Collection Time: 09/18/17 10:31 AM  Result Value Ref Range Status   Specimen Description PERITONEAL  Final   Special Requests NONE  Final   Culture   Final    NO  GROWTH 4 DAYS Performed at [facility], [place] [address], [place], [place] [address]    Report Status PENDING  Incomplete  Gram stain     Status: None   Collection Time: 09/18/17 10:31 AM  Result Value Ref Range Status   Specimen Description PERITONEAL  Final   Special Requests NONE  Final   Gram Stain   Final    RARE WBC PRESENT, PREDOMINANTLY PMN NO ORGANISMS SEEN Performed at [facility], [place] [address] [place], [place] [address]    Report Status 09/18/2017 FINAL  Final      Radiology Studies: US Renal  Result Date: 09/21/2017 CLINICAL DATA:  Acute kidney injury. EXAM: RENAL / URINARY TRACT ULTRASOUND COMPLETE COMPARISON:  CT scan of the abdomen dated 09/17/2017 FINDINGS: Right Kidney: Length: 9.1 cm. Echogenicity within normal limits. No mass or hydronephrosis visualized. Left Kidney: Length: 10.4 cm. Echogenicity within normal limits. No solid mass or hydronephrosis visualized. 1.4 cm cyst in the mid left kidney. Bladder: Empty with Foley catheter in place. IMPRESSION: No significant abnormality of the kidneys.  Electronically Signed   By: Lorriane Shire M.D.   On: 09/21/2017 09:16   Dg Chest Port 1 View  Result Date: 09/22/2017 CLINICAL DATA:  Follow-up aspiration pneumonia. EXAM: PORTABLE CHEST 1 VIEW COMPARISON:  Chest radiograph performed 09/12/2017, and CTA of the chest performed 09/17/2017 FINDINGS: Persistent moderate to large bilateral pleural effusions are noted, larger on the right. Underlying vascular congestion is noted. Increased interstitial markings raise concern for pulmonary edema. Known underlying aspiration pneumonia is grossly unchanged, though difficult to fully assess due to pleural effusions. No pneumothorax is seen. The cardiomediastinal silhouette is borderline normal in size. No acute osseous abnormalities are identified. IMPRESSION: 1. Persistent moderate to large bilateral pleural effusions, larger on the right. Underlying vascular congestion noted. Increased interstitial markings raise concern for pulmonary edema. 2. Known underlying aspiration pneumonia is grossly unchanged, though difficult to fully assess due to pleural effusions. Electronically Signed   By: Garald Balding M.D.   On: 09/22/2017 23:55        Scheduled Meds: . ARIPiprazole  2 mg Oral Daily  . aspirin EC  81 mg Oral Daily  . atorvastatin  80 mg Oral q1800  . cholecalciferol  2,000 Units Oral Daily  . [START ON 09/24/2017] enoxaparin (LOVENOX) injection  70 mg Subcutaneous Q24H  . feeding supplement (ENSURE ENLIVE)  237 mL Oral BID BM  . folic acid  1 mg Oral Daily  . insulin aspart  0-9 Units Subcutaneous Q4H  . levETIRAcetam  500 mg Oral BID  . lipase/protease/amylase  12,000 Units Oral TID WC  . metoprolol tartrate  5 mg Intravenous Q4H  . pantoprazole  40 mg Oral BID  . senna-docusate  1 tablet Oral BID  . sertraline  50 mg Oral Daily  . tamsulosin  0.4 mg Oral Daily   Continuous Infusions: . sodium chloride 100 mL/hr at 09/23/17 0540     LOS: 11 days    Time spent: 25 minutes  Greater  than 50% of the time spent on counseling and coordinating the care.   Leisa Lenz, MD [facility] Pager [phone]  If 7PM-7AM, please contact night-coverage [email or web address] Password [email or web address] 09/23/2017, 1:23 PM

## 2017-09-23 NOTE — Plan of Care (Signed)
  Progressing Clinical Measurements: Ability to maintain clinical measurements within normal limits will improve 09/23/2017 2259 - Progressing by Talbert Forest, RN Will remain free from infection 09/23/2017 2259 - Progressing by Talbert Forest, RN Diagnostic test results will improve 09/23/2017 2259 - Progressing by Talbert Forest, RN Respiratory complications will improve 09/23/2017 2259 - Progressing by Talbert Forest, RN Cardiovascular complication will be avoided 09/23/2017 2259 - Progressing by Talbert Forest, RN Pain Managment: General experience of comfort will improve 09/23/2017 2259 - Progressing by Talbert Forest, RN Safety: Ability to remain free from injury will improve 09/23/2017 2259 - Progressing by Talbert Forest, RN

## 2017-09-24 LAB — BASIC METABOLIC PANEL
Anion gap: 6 (ref 5–15)
BUN: 39 mg/dL — AB (ref 6–20)
CHLORIDE: 120 mmol/L — AB (ref 101–111)
CO2: 15 mmol/L — AB (ref 22–32)
CREATININE: 2.21 mg/dL — AB (ref 0.61–1.24)
Calcium: 8.3 mg/dL — ABNORMAL LOW (ref 8.9–10.3)
GFR calc Af Amer: 31 mL/min — ABNORMAL LOW (ref >60)
GFR calc non Af Amer: 27 mL/min — ABNORMAL LOW (ref >60)
GLUCOSE: 112 mg/dL — AB (ref 65–99)
POTASSIUM: 4.5 mmol/L (ref 3.5–5.1)
Sodium: 141 mmol/L (ref 135–145)

## 2017-09-24 LAB — CBC
HEMATOCRIT: 27.6 % — AB (ref 39.0–52.0)
HEMOGLOBIN: 8.2 g/dL — AB (ref 13.0–17.0)
MCH: 21.5 pg — AB (ref 26.0–34.0)
MCHC: 29.7 g/dL — AB (ref 30.0–36.0)
MCV: 72.4 fL — AB (ref 78.0–100.0)
Platelets: 538 10*3/uL — ABNORMAL HIGH (ref 150–400)
RBC: 3.81 MIL/uL — AB (ref 4.22–5.81)
RDW: 22.7 % — ABNORMAL HIGH (ref 11.5–15.5)
WBC: 13.7 10*3/uL — ABNORMAL HIGH (ref 4.0–10.5)

## 2017-09-24 LAB — GLUCOSE, CAPILLARY
GLUCOSE-CAPILLARY: 122 mg/dL — AB (ref 65–99)
Glucose-Capillary: 84 mg/dL (ref 65–99)

## 2017-09-24 MED ORDER — ENOXAPARIN SODIUM 80 MG/0.8ML ~~LOC~~ SOLN: 70.0000 mg | SUBCUTANEOUS | 0 refills | Status: DC

## 2017-09-24 MED ORDER — HYDROCODONE-ACETAMINOPHEN 5-325 MG PO TABS: 1.0000 | ORAL_TABLET | Freq: Four times a day (QID) | ORAL | 0 refills | Status: DC | PRN

## 2017-09-24 MED ORDER — ENSURE ENLIVE PO LIQD: 237.0000 mL | Freq: Two times a day (BID) | ORAL | 12 refills | Status: DC

## 2017-09-24 MED ORDER — TAMSULOSIN HCL 0.4 MG PO CAPS: 0.4000 mg | ORAL_CAPSULE | Freq: Every day | ORAL | 0 refills | Status: DC

## 2017-09-24 MED ORDER — ALUM & MAG HYDROXIDE-SIMETH 200-200-20 MG/5ML PO SUSP: 15.0000 mL | ORAL | 0 refills | Status: DC | PRN

## 2017-09-24 NOTE — Discharge Instructions (Signed)
Levetiracetam tablets What is this medicine? LEVETIRACETAM (lee ve tye RA se tam) is an antiepileptic drug. It is used with other medicines to treat certain types of seizures. This medicine may be used for other purposes; ask your health care provider or pharmacist if you have questions. COMMON BRAND NAME(S): Keppra, Roweepra What should I tell my health care provider before I take this medicine? They need to know if you have any of these conditions: -kidney disease -suicidal thoughts, plans, or attempt; a previous suicide attempt by you or a family member -an unusual or allergic reaction to levetiracetam, other medicines, foods, dyes, or preservatives -pregnant or trying to get pregnant -breast-feeding How should I use this medicine? Take this medicine by mouth with a glass of water. Follow the directions on the prescription label. Swallow the tablets whole. Do not crush or chew this medicine. You may take this medicine with or without food. Take your doses at regular intervals. Do not take your medicine more often than directed. Do not stop taking this medicine or any of your seizure medicines unless instructed by your doctor or health care professional. Stopping your medicine suddenly can increase your seizures or their severity. A special MedGuide will be given to you by the pharmacist with each prescription and refill. Be sure to read this information carefully each time. Contact your pediatrician or health care professional regarding the use of this medication in children. While this drug may be prescribed for children as young as 4 years of age for selected conditions, precautions do apply. Overdosage: If you think you have taken too much of this medicine contact a poison control center or emergency room at once. NOTE: This medicine is only for you. Do not share this medicine with others. What if I miss a dose? If you miss a dose, take it as soon as you can. If it is almost time for your  next dose, take only that dose. Do not take double or extra doses. What may interact with this medicine? This medicine may interact with the following medications: -carbamazepine -colesevelam -probenecid -sevelamer This list may not describe all possible interactions. Give your health care provider a list of all the medicines, herbs, non-prescription drugs, or dietary supplements you use. Also tell them if you smoke, drink alcohol, or use illegal drugs. Some items may interact with your medicine. What should I watch for while using this medicine? Visit your doctor or health care professional for a regular check on your progress. Wear a medical identification bracelet or chain to say you have epilepsy, and carry a card that lists all your medications. It is important to take this medicine exactly as instructed by your health care professional. When first starting treatment, your dose may need to be adjusted. It may take weeks or months before your dose is stable. You should contact your doctor or health care professional if your seizures get worse or if you have any new types of seizures. You may get drowsy or dizzy. Do not drive, use machinery, or do anything that needs mental alertness until you know how this medicine affects you. Do not stand or sit up quickly, especially if you are an older patient. This reduces the risk of dizzy or fainting spells. Alcohol may interfere with the effect of this medicine. Avoid alcoholic drinks. The use of this medicine may increase the chance of suicidal thoughts or actions. Pay special attention to how you are responding while on this medicine. Any worsening of mood, or   thoughts of suicide or dying should be reported to your health care professional right away. Women who become pregnant while using this medicine may enroll in the [place] Antiepileptic Drug Pregnancy Registry by calling 1-888-233-2334. This registry collects information about the safety of  antiepileptic drug use during pregnancy. What side effects may I notice from receiving this medicine? Side effects that you should report to your doctor or health care professional as soon as possible: -allergic reactions like skin rash, itching or hives, swelling of the face, lips, or tongue -breathing problems -dark urine -general ill feeling or flu-like symptoms -problems with balance, talking, walking -unusually weak or tired -worsening of mood, thoughts or actions of suicide or dying -yellowing of the eyes or skin Side effects that usually do not require medical attention (report to your doctor or health care professional if they continue or are bothersome): -diarrhea -dizzy, drowsy -headache -loss of appetite This list may not describe all possible side effects. Call your doctor for medical advice about side effects. You may report side effects to FDA at 1-800-FDA-1088. Where should I keep my medicine? Keep out of reach of children. Store at room temperature between 15 and 30 degrees C (59 and 86 degrees F). Throw away any unused medicine after the expiration date. NOTE: This sheet is a summary. It may not cover all possible information. If you have questions about this medicine, talk to your doctor, pharmacist, or health care provider.  2018 Elsevier/Gold Standard (2015-10-21 09:43:54)  

## 2017-09-24 NOTE — Progress Notes (Signed)
Pt discharged today and will return to [facility] room 227-B- report [phone]. Notified pt's daughter- she states she will go to [place] later to meet pt. Arranged [facility] transportation. All information provided to facility via the [place].  Sharren Bridge, MSW, LCSW Clinical Social Work 09/24/2017 [phone]

## 2017-09-24 NOTE — Discharge Summary (Addendum)
Physician Discharge Summary  [name]. ZOX:[id number] DOB: March 30, 1941 DOA: 09/12/2017  PCP: [name], DO  Admit date: 09/12/2017 Discharge date: 09/24/2017  Recommendations for Outpatient Follow-up:  1. 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   Hospital Course:  Healthcare associated pneumonia/aspiration pneumonia - Patient was tachycardic with mild elevated lactic acid on admission, and chest x-ray showing possible pneumonia. He was started on IV vancomycin and cefepime later on transition transition to Unasyn and then to Augmentin to complete the course - Stable  Sinus tachycardia sec to pulmonary embolism.  - Patient was found to have an acute DVT in the left leg, CT angiogram showed pulmonary embolism. - Echocardiogram does not show any right heart strain. Patient was started on Lovenox currently getting therapeutic dose of Lovenox. 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 He underwent paracentesis on December 18 and 4 L were removed. 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 - Hospice and palliative to continue to follow in SNF  Acute on chronic kidney disease stage II/urinary retention - Ultrasound renal negative for hydronephrosis.  Ultrasound of the bladder did not reveal any urinary retention - Cr continues to trend up  Hypertension / Hypotension - Patient became lethargic hypotensive tachycardic last night requiring multiple fluid boluses to keep map greater than 65 he was ultimately transferred to stepdown for closer monitoring. After multiple fluid boluses his blood pressure stable around 90s over 70s. Today he is awake alert and conversant with his family members. He is off all blood pressure medications except for metoprolol which is to be given if his heart rate is greater than 110. Suspect he is third spacing. Keep MAP greater than 65 - BP meds on hold due to hypotension   Insulin-dependent diabetes - Hemoglobin A1c is 10.5 - Continue SSI  CVA/seizures/dysphagia - Continue Keppra   History of alcohol abuse/history of erosive esophagitis/chronic pancreatitis - Continue pancreatic lipase supplementation   Severe protein calorie malnutrition - Continue current diet  History of severe major depression - Stable   Left toe pressure skin injury, stage 1  - per NR care      Signed:  Leisa Lenz, MD  [facility] 09/24/2017, 7:26 AM  Pager #: [phone]  Time spent in minutes: more than 30 minutes    Discharge Exam: Vitals:   09/23/17 2004 09/24/17 0418  BP: 118/85 114/83  Pulse: (!) 120 (!) 107  Resp: 19 20  Temp: 98.5 F (36.9 C) 99 F (37.2 C)  SpO2: 91% 92%   Vitals:   09/23/17 1542 09/23/17 2003 09/23/17 2004 09/24/17 0418  BP: 132/89  118/85 114/83  Pulse: (!) 114  (!) 120 (!) 107  Resp: 18  19 20   Temp: (!) 97.3 F (36.3 C)  98.5 F (36.9 C) 99 F (  37.2 C)  TempSrc: Oral Oral Axillary Axillary  SpO2: 100%  91% 92%  Weight:      Height:        General: Pt is not in acute distress Cardiovascular: Regular rate and rhythm, S1/S2 +, Respiratory: no wheezing, no crackles, no rhonchi Abdominal: Soft, non tender, non distended, bowel sounds +, no  guarding Extremities: no edema, no cyanosis, pulses palpable bilaterally  Neuro: Grossly nonfocal  Discharge Instructions  Discharge Instructions    Call MD for:  persistant nausea and vomiting   Complete by:  As directed    Call MD for:  redness, tenderness, or signs of infection (pain, swelling, redness, odor or green/yellow discharge around incision site)   Complete by:  As directed    Call MD for:  severe uncontrolled pain   Complete by:  As directed    Diet - low sodium heart healthy   Complete by:  As directed    Discharge instructions   Complete by:  As directed    Palliative and hospice to continue to follow in SNF Stop Lovenox if any sign of bleed   Increase activity slowly   Complete by:  As directed      Allergies as of 09/24/2017      Reactions   Dilantin [phenytoin Sodium Extended] Other (See Comments)   Developed Complete Heart Block following 1 dose      Medication List    STOP taking these medications   amLODipine 5 MG tablet Commonly known as:  NORVASC   insulin detemir 100 UNIT/ML injection Commonly known as:  LEVEMIR   LANTUS SOLOSTAR 100 UNIT/ML Solostar Pen Generic drug:  Insulin Glargine   metoprolol tartrate 25 MG tablet Commonly known as:  LOPRESSOR     TAKE these medications   acetaminophen 325 MG tablet Commonly known as:  TYLENOL Take 650 mg by mouth every 6 (six) hours as needed for moderate pain.   alum & mag hydroxide-simeth 200-200-20 MG/5ML suspension Commonly known as:  MAALOX/MYLANTA Take 15 mLs by mouth every 4 (four) hours as needed for indigestion or heartburn.   ARIPiprazole 2 MG tablet Commonly known as:  ABILIFY Take 2 mg by mouth daily.   aspirin 81 MG tablet Take 81 mg by mouth daily.   atorvastatin 80 MG tablet Commonly known as:  LIPITOR   bisacodyl 10 MG suppository Commonly known as:  DULCOLAX Place 10 mg rectally as needed for moderate constipation.   enoxaparin 80 MG/0.8ML injection Commonly known as:   LOVENOX Inject 0.7 mLs (70 mg total) into the skin daily. What changed:    medication strength  how much to take  how to take this  when to take this   folic acid 1 MG tablet Commonly known as:  FOLVITE Take 1 mg by mouth daily.   HYDROcodone-acetaminophen 5-325 MG tablet Commonly known as:  NORCO/VICODIN Take 1-2 tablets by mouth every 6 (six) hours as needed for moderate pain or severe pain.   levETIRAcetam 500 MG tablet Commonly known as:  KEPPRA Take 1 tablet (500 mg total) by mouth 2 (two) times daily.   magnesium oxide 400 MG tablet Commonly known as:  MAG-OX Take 400 mg by mouth 2 (two) times daily.   NOVOLOG 100 UNIT/ML injection Generic drug:  insulin aspart Inject as per sliding scale subcutaneously after meals 0 - 70 = 0 units 71 - 400 = 11 units Notify MD for BS less than 70 and over 400 What changed:  Another medication  with the same name was removed. Continue taking this medication, and follow the directions you see here.   NUTRITIONAL SUPPLEMENT PO House supplement 120cc by mouth three times daily What changed:  Another medication with the same name was added. Make sure you understand how and when to take each.   NUTRITIONAL SUPPLEMENT PO HSG regular diet - HSG Mech Soft texture, Regular consistency What changed:  Another medication with the same name was added. Make sure you understand how and when to take each.   feeding supplement (ENSURE ENLIVE) Liqd Take 237 mLs by mouth 2 (two) times daily between meals. What changed:  You were already taking a medication with the same name, and this prescription was added. Make sure you understand how and when to take each.   ondansetron 4 MG tablet Commonly known as:  ZOFRAN Take 4 mg by mouth every 6 (six) hours as needed for nausea or vomiting.   Pancrelipase (Lip-Prot-Amyl) 10440 units Tabs Take 1 capsule by mouth 3 (three) times daily with meals.   PROTONIX 40 mg/20 mL Pack Generic drug:   pantoprazole sodium Take 40 mg by mouth 2 (two) times daily.   sertraline 50 MG tablet Commonly known as:  ZOLOFT Take 50 mg by mouth daily.   tamsulosin 0.4 MG Caps capsule Commonly known as:  FLOMAX Take 1 capsule (0.4 mg total) by mouth daily.   Vitamin D 2000 units Caps Take 2,000 Units by mouth daily.       Follow-up Information    [name], DO. Schedule an appointment as soon as possible for a visit.   Specialty:  Internal Medicine Contact information: [place] [id number] [phone]            The results of significant diagnostics from this hospitalization (including imaging, microbiology, ancillary and laboratory) are listed below for reference.    Significant Diagnostic Studies: Ct Head Wo Contrast  Result Date: 09/12/2017 CLINICAL DATA:  Altered level of consciousness in this 76 year old male. EXAM: CT HEAD WITHOUT CONTRAST TECHNIQUE: Contiguous axial images were obtained from the base of the skull through the vertex without intravenous contrast. COMPARISON:  04/25/2017 FINDINGS: Brain: Encephalomalacia involving the right PCA distribution similar to prior involving the right occipital lobe and thalamus. Chronic stable atrophy with microvascular ischemia involving the white matter. No acute intracranial hemorrhage, midline shift or edema. No significant cerebellar atrophy. Midline fourth ventricle and basal cisterns without effacement. Vascular: No hyperdense vessel or unexpected calcification. Skull: Negative for fracture or focal lesion. Sinuses/Orbits: Chronic appearing left frontal sinus mucosal thickening with minimal ethmoid sinus mucosal thickening. Intact orbits and globes with bilateral cataract extractions. Other: None IMPRESSION: 1. Chronic stable atrophy, right PCA distribution chronic infarct with associated encephalomalacia and chronic microvascular ischemic disease of white matter. 2. No acute intracranial abnormality noted. 3.  Chronic left frontal sinus mucosal thickening. Electronically Signed   By: Ashley Royalty M.D.   On: 09/12/2017 20:08   Ct Angio Chest Pe W Or Wo Contrast  Result Date: 09/17/2017 CLINICAL DATA:  Decreased oral intake, intermittent nausea and vomiting, weight loss. Initial x-ray raised concern for aspiration pneumonia and possibly ileus. EXAM: CT ANGIOGRAPHY CHEST CT ABDOMEN AND PELVIS WITH CONTRAST TECHNIQUE: Multidetector CT imaging of the chest was performed using the standard protocol during bolus administration of intravenous contrast. Multiplanar CT image reconstructions and MIPs were obtained to evaluate the vascular anatomy. Multidetector CT imaging of the abdomen and pelvis was performed using the standard protocol during bolus  administration of intravenous contrast. CONTRAST:  127mL ISOVUE-370 IOPAMIDOL (ISOVUE-370) INJECTION 76% COMPARISON:  CT abdomen dated 04/29/2016 FINDINGS: CTA CHEST FINDINGS Cardiovascular: Single small pulmonary embolism is seen within a lateral segmental pulmonary artery branch to the right lower lobe. No additional pulmonary embolism identified, although some of the most peripheral segmental and subsegmental pulmonary arteries in the upper lobes are difficult to definitively characterize due to mild patient breathing motion artifact. 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 Mild aortic atherosclerosis. No evidence of aortic dissection seen. Mild cardiomegaly. No pericardial effusion. Coronary artery calcifications. Mediastinum/Nodes: Large hiatal hernia. Upper esophagus is unremarkable. No mass or enlarged lymph nodes appreciated within the mediastinum or perihilar regions. Masslike material within the lower aspects of the trachea, most likely mucus secretions or debris. Lungs/Pleura: Large right pleural effusion and moderate-sized left pleural effusion, both with associated compressive atelectasis at each lung base. Chronic scarring/ fibrosis  and emphysematous changes within the upper lobes. Musculoskeletal: Osteopenia. No acute or suspicious osseous finding. Review of the MIP images confirms the above findings. CT ABDOMEN and PELVIS FINDINGS Hepatobiliary: Multiple (at least 6) lesions within the bilateral liver lobes. Largest lesion is within the posterior inferior segment of the right liver lobe (segment 6) which measures 4.2 by 3 cm. All lesions are new compared to the previous abdomen CT and are consistent with metastases. Gallbladder is filled with stones. No convincing evidence of acute cholecystitis. Bile ducts appear stable. Pancreas: Stable appearance of chronic pancreatitis. No evidence of a superimposed acute pancreatitis. Spleen: Normal in size without focal abnormality. Adrenals/Urinary Tract: Adrenal glands appear normal. Bilateral renal cysts. No renal stone or hydronephrosis seen. Bladder is decompressed by Foley catheter. Stomach/Bowel: No dilated large or small bowel. Extensive diverticulosis without evidence of acute diverticulitis. Vascular/Lymphatic: Aortic atherosclerosis. No enlarged lymph nodes identified. Reproductive: No prostate gland enlargement seen. Other: Large volume ascites. There is additional ill-defined omental thickening and peritoneal wall thickening suggesting metastatic implants, most likely malignant ascites. Musculoskeletal: Osteopenia limits characterization of osseous detail. Mottled appearance of multiple thoracic and lumbar vertebral bodies, suspicious for early osseous metastases. Anasarca. Review of the MIP images confirms the above findings. IMPRESSION: 1. Single small pulmonary embolism within a lateral segmental pulmonary artery branch to the right lower lobe. This pulmonary embolism is of uncertain age, possibly chronic given its linear configuration. No additional pulmonary emboli identified, although some peripheral pulmonary artery branches are difficult to definitively characterize due to mild  patient breathing motion artifact. 2. Large right pleural effusion and moderate-sized left pleural effusion, both with adjacent compressive atelectasis. 3. Masslike material within the lower trachea, most likely mucous secretions and/or debris. 4. New liver metastases, bilateral liver lobes, largest metastasis within segment 6 of the right liver lobe measuring 4.2 x 3 cm. 5. Large volume ascites, almost certainly malignant ascites. Associated ill-defined thickening of the omentum and peritoneal wall suggesting associated metastatic implants. 6. Multiple thoracic and lumbar vertebral bodies with a mottled appearance which could represent patchy osteopenia or early metastases, favor early osseous metastases. 7. Large hiatal hernia, stable in configuration. 8. Cholelithiasis without evidence of acute cholecystitis. 9. Diverticulosis without evidence of acute diverticulitis. No evidence of bowel obstruction. 10. Aortic atherosclerosis. 11. 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 12. Chronic scarring/fibrosis and emphysematous changes within the upper lungs. 13. Evidence of chronic pancreatitis, similar to previous exam. 14. Bladder is decompressed by Foley catheter. On the previous study, a bladder wall mass was identified, suspected  primary neoplasm. 15. Anasarca. These results were called by telephone at the time of interpretation on 09/17/2017 at 2:35 pm to Dr. Florencia Reasons , who verbally acknowledged these results. Electronically Signed   By: Franki Cabot M.D.   On: 09/17/2017 14:38   Ct Abdomen Pelvis W Contrast  Result Date: 09/17/2017 CLINICAL DATA:  Decreased oral intake, intermittent nausea and vomiting, weight loss. Initial x-ray raised concern for aspiration pneumonia and possibly ileus. EXAM: CT ANGIOGRAPHY CHEST CT ABDOMEN AND PELVIS WITH CONTRAST TECHNIQUE: Multidetector CT imaging of the chest was performed using the standard protocol during bolus  administration of intravenous contrast. Multiplanar CT image reconstructions and MIPs were obtained to evaluate the vascular anatomy. Multidetector CT imaging of the abdomen and pelvis was performed using the standard protocol during bolus administration of intravenous contrast. CONTRAST:  152mL ISOVUE-370 IOPAMIDOL (ISOVUE-370) INJECTION 76% COMPARISON:  CT abdomen dated 04/29/2016 FINDINGS: CTA CHEST FINDINGS Cardiovascular: Single small pulmonary embolism is seen within a lateral segmental pulmonary artery branch to the right lower lobe. No additional pulmonary embolism identified, although some of the most peripheral segmental and subsegmental pulmonary arteries in the upper lobes are difficult to definitively characterize due to mild patient breathing motion artifact. 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 Mild aortic atherosclerosis. No evidence of aortic dissection seen. Mild cardiomegaly. No pericardial effusion. Coronary artery calcifications. Mediastinum/Nodes: Large hiatal hernia. Upper esophagus is unremarkable. No mass or enlarged lymph nodes appreciated within the mediastinum or perihilar regions. Masslike material within the lower aspects of the trachea, most likely mucus secretions or debris. Lungs/Pleura: Large right pleural effusion and moderate-sized left pleural effusion, both with associated compressive atelectasis at each lung base. Chronic scarring/ fibrosis and emphysematous changes within the upper lobes. Musculoskeletal: Osteopenia. No acute or suspicious osseous finding. Review of the MIP images confirms the above findings. CT ABDOMEN and PELVIS FINDINGS Hepatobiliary: Multiple (at least 6) lesions within the bilateral liver lobes. Largest lesion is within the posterior inferior segment of the right liver lobe (segment 6) which measures 4.2 by 3 cm. All lesions are new compared to the previous abdomen CT and are consistent with metastases. Gallbladder is  filled with stones. No convincing evidence of acute cholecystitis. Bile ducts appear stable. Pancreas: Stable appearance of chronic pancreatitis. No evidence of a superimposed acute pancreatitis. Spleen: Normal in size without focal abnormality. Adrenals/Urinary Tract: Adrenal glands appear normal. Bilateral renal cysts. No renal stone or hydronephrosis seen. Bladder is decompressed by Foley catheter. Stomach/Bowel: No dilated large or small bowel. Extensive diverticulosis without evidence of acute diverticulitis. Vascular/Lymphatic: Aortic atherosclerosis. No enlarged lymph nodes identified. Reproductive: No prostate gland enlargement seen. Other: Large volume ascites. There is additional ill-defined omental thickening and peritoneal wall thickening suggesting metastatic implants, most likely malignant ascites. Musculoskeletal: Osteopenia limits characterization of osseous detail. Mottled appearance of multiple thoracic and lumbar vertebral bodies, suspicious for early osseous metastases. Anasarca. Review of the MIP images confirms the above findings. IMPRESSION: 1. Single small pulmonary embolism within a lateral segmental pulmonary artery branch to the right lower lobe. This pulmonary embolism is of uncertain age, possibly chronic given its linear configuration. No additional pulmonary emboli identified, although some peripheral pulmonary artery branches are difficult to definitively characterize due to mild patient breathing motion artifact. 2. Large right pleural effusion and moderate-sized left pleural effusion, both with adjacent compressive atelectasis. 3. Masslike material within the lower trachea, most likely mucous secretions and/or debris. 4. New liver metastases, bilateral liver lobes, largest metastasis within  segment 6 of the right liver lobe measuring 4.2 x 3 cm. 5. Large volume ascites, almost certainly malignant ascites. Associated ill-defined thickening of the omentum and peritoneal wall  suggesting associated metastatic implants. 6. Multiple thoracic and lumbar vertebral bodies with a mottled appearance which could represent patchy osteopenia or early metastases, favor early osseous metastases. 7. Large hiatal hernia, stable in configuration. 8. Cholelithiasis without evidence of acute cholecystitis. 9. Diverticulosis without evidence of acute diverticulitis. No evidence of bowel obstruction. 10. Aortic atherosclerosis. 11. 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 12. Chronic scarring/fibrosis and emphysematous changes within the upper lungs. 13. Evidence of chronic pancreatitis, similar to previous exam. 14. Bladder is decompressed by Foley catheter. On the previous study, a bladder wall mass was identified, suspected primary neoplasm. 15. Anasarca. These results were called by telephone at the time of interpretation on 09/17/2017 at 2:35 pm to Dr. Florencia Reasons , who verbally acknowledged these results. Electronically Signed   By: Franki Cabot M.D.   On: 09/17/2017 14:38   US Renal  Result Date: 09/21/2017 CLINICAL DATA:  Acute kidney injury. EXAM: RENAL / URINARY TRACT ULTRASOUND COMPLETE COMPARISON:  CT scan of the abdomen dated 09/17/2017 FINDINGS: Right Kidney: Length: 9.1 cm. Echogenicity within normal limits. No mass or hydronephrosis visualized. Left Kidney: Length: 10.4 cm. Echogenicity within normal limits. No solid mass or hydronephrosis visualized. 1.4 cm cyst in the mid left kidney. Bladder: Empty with Foley catheter in place. IMPRESSION: No significant abnormality of the kidneys. Electronically Signed   By: Lorriane Shire M.D.   On: 09/21/2017 09:16   US Abdomen Limited  Result Date: 09/13/2017 CLINICAL DATA:  Gallstones. EXAM: ULTRASOUND ABDOMEN LIMITED RIGHT UPPER QUADRANT COMPARISON:  Abdominal ultrasound of December 13, 2012 FINDINGS: Gallbladder: The gallbladder is filled with multiple stones and exhibits a wall echo shadow (WES sign)  appearance. The gallbladder wall is thickened to just under 6 mm. There is no pericholecystic fluid or positive sonographic Murphy's sign. Common bile duct: Diameter: 4.5 mm Liver: The hepatic echotexture is heterogeneous. There are 3 hypoechoic masses within the right lobe with the largest measuring 4.2 x 3.1 x 3.8 cm. There is no intrahepatic ductal dilation. The left lobe is not well evaluated due to bowel gas. Portal vein is patent on color Doppler imaging with normal direction of blood flow towards the liver. IMPRESSION: Abnormal hypoechoic masses within the liver worrisome for malignancy. Hepatic protocol MRI is recommended. Multiple gallstones packing the gallbladder lumen. No sonographic evidence of acute cholecystitis. Electronically Signed   By: David  Martinique M.D.   On: 09/13/2017 16:59   US Paracentesis  Result Date: 09/18/2017 INDICATION: Patient with history of altered mental status, malnutrition, weight loss, PE, pleural effusions, liver lesions, ascites ; request made for diagnostic and therapeutic paracentesis. EXAM: ULTRASOUND GUIDED DIAGNOSTIC AND THERAPEUTIC PARACENTESIS MEDICATIONS: None. COMPLICATIONS: None immediate. PROCEDURE: Informed written consent was obtained from the patient after a discussion of the risks, benefits and alternatives to treatment. A timeout was performed prior to the initiation of the procedure. Initial ultrasound scanning demonstrates a large amount of ascites within the left lower abdominal quadrant. The left lower abdomen was prepped and draped in the usual sterile fashion. 1% lidocaine was used for local anesthesia. Following this, a Yueh catheter was introduced. An ultrasound image was saved for documentation purposes. The paracentesis was performed. The catheter was removed and a dressing was applied. The patient tolerated the procedure well without immediate post procedural complication. FINDINGS:  A total of approximately 4.4 liters of hazy, amber fluid was  removed. Samples were sent to the laboratory as requested by the clinical team. IMPRESSION: Successful ultrasound-guided diagnostic and therapeutic paracentesis yielding 4.4 liters of peritoneal fluid. Read by: Rowe Robert, PA-C Electronically Signed   By: Markus Daft M.D.   On: 09/18/2017 11:25   Dg Chest Port 1 View  Result Date: 09/22/2017 CLINICAL DATA:  Follow-up aspiration pneumonia. EXAM: PORTABLE CHEST 1 VIEW COMPARISON:  Chest radiograph performed 09/12/2017, and CTA of the chest performed 09/17/2017 FINDINGS: Persistent moderate to large bilateral pleural effusions are noted, larger on the right. Underlying vascular congestion is noted. Increased interstitial markings raise concern for pulmonary edema. Known underlying aspiration pneumonia is grossly unchanged, though difficult to fully assess due to pleural effusions. No pneumothorax is seen. The cardiomediastinal silhouette is borderline normal in size. No acute osseous abnormalities are identified. IMPRESSION: 1. Persistent moderate to large bilateral pleural effusions, larger on the right. Underlying vascular congestion noted. Increased interstitial markings raise concern for pulmonary edema. 2. Known underlying aspiration pneumonia is grossly unchanged, though difficult to fully assess due to pleural effusions. Electronically Signed   By: Garald Balding M.D.   On: 09/22/2017 23:55   Dg Chest Port 1 View  Result Date: 09/12/2017 CLINICAL DATA:  Cough.  Possible aspiration pneumonia. EXAM: PORTABLE CHEST 1 VIEW COMPARISON:  07/17/2017 FINDINGS: Mildly enlarged cardiac silhouette. Stable right peritracheal stripe thickening, thought to represent vascular markings. There is no evidence of pneumothorax. Right lower lobe atelectasis versus airspace consolidation. Left lower lobe airspace consolidation. Osseous structures are without acute abnormality. Soft tissues are grossly normal. IMPRESSION: Bilateral lower lobe peribronchial airspace  consolidation with superimposed mild atelectatic changes in the right lung base. Aspiration pneumonia is certainly a possibility with this appearance. Electronically Signed   By: Fidela Salisbury M.D.   On: 09/12/2017 17:54   Dg Abd 2 Views  Result Date: 09/13/2017 CLINICAL DATA:  Follow-up ileus EXAM: ABDOMEN - 2 VIEW COMPARISON:  Abdomen films of 07/17/2017 FINDINGS: Both large and small bowel gas is present without evidence of bowel obstruction. This appearance could be due to aerophagia or mild ileus. Multiple calcified gallstones again are noted within the right upper quadrant. Also there are calcifications overlying the epigastrium consistent with chronic calcific pancreatitis. Degenerative disc disease at L5-S1 is present. IMPRESSION: 1. Both large and small bowel gas is present without significant distension, possibly due to aerophagia or very minimal ileus. 2. Multiple gallstones. 3. Changes of chronic calcific pancreatitis. Electronically Signed   By: Ivar Drape M.D.   On: 09/13/2017 10:09    Microbiology: Recent Results (from the past 240 hour(s))  Culture, body fluid-bottle     Status: None   Collection Time: 09/18/17 10:31 AM  Result Value Ref Range Status   Specimen Description PERITONEAL  Final   Special Requests NONE  Final   Culture   Final    NO GROWTH 5 DAYS Performed at [facility], [address] [place], [place] [address]    Report Status 09/23/2017 FINAL  Final  Gram stain     Status: None   Collection Time: 09/18/17 10:31 AM  Result Value Ref Range Status   Specimen Description PERITONEAL  Final   Special Requests NONE  Final   Gram Stain   Final    RARE WBC PRESENT, PREDOMINANTLY PMN NO ORGANISMS SEEN Performed at [facility], [place] [address], [place], [place] [address]    Report Status  09/18/2017 FINAL  Final     Labs: Basic Metabolic Panel: Recent Labs  Lab 09/18/17 0521 09/19/17 0615 09/20/17 0549 09/21/17 0256 09/22/17 0535  09/23/17 0605 09/24/17 0444  NA 140 140 138  --   --   --  141  K 4.1 4.0 4.1  --   --   --  4.5  CL 115* 114* 114*  --   --   --  120*  CO2 19* 20* 18*  --   --   --  15*  GLUCOSE 121* 109* 101*  --   --   --  112*  BUN 28* 30* 31*  --   --   --  39*  CREATININE 1.17 1.47* 1.59* 1.71* 1.81* 1.98* 2.21*  CALCIUM 8.9 8.6* 8.4*  --   --   --  8.3*   Liver Function Tests: No results for input(s): AST, ALT, ALKPHOS, BILITOT, PROT, ALBUMIN in the last 168 hours. No results for input(s): LIPASE, AMYLASE in the last 168 hours. No results for input(s): AMMONIA in the last 168 hours. CBC: Recent Labs  Lab 09/18/17 0521 09/19/17 0615 09/20/17 0549 09/23/17 0605 09/24/17 0444  WBC 14.8* 14.5* 17.9* 16.2* 13.7*  NEUTROABS  --  10.9*  --   --   --   HGB 8.7* 8.8* 9.7* 8.8* 8.2*  HCT 29.7* 29.5* 32.4* 29.4* 27.6*  MCV 73.3* 73.6* 73.5* 72.6* 72.4*  PLT 472* 505* 470* 524* 538*   Cardiac Enzymes: No results for input(s): CKTOTAL, CKMB, CKMBINDEX, TROPONINI in the last 168 hours. BNP: BNP (last 3 results) No results for input(s): BNP in the last 8760 hours.  ProBNP (last 3 results) No results for input(s): PROBNP in the last 8760 hours.  CBG: Recent Labs  Lab 09/23/17 1135 09/23/17 1728 09/23/17 1959 09/23/17 2347 09/24/17 0414  GLUCAP 130* 118* 128* 74 122*

## 2017-09-25 LAB — BASIC METABOLIC PANEL
BUN: 41 — AB (ref 4–21)
CREATININE: 2 — AB (ref 0.6–1.3)
Glucose: 74
Potassium: 4.9 (ref 3.4–5.3)
Sodium: 146 (ref 137–147)

## 2017-09-25 LAB — CBC AND DIFFERENTIAL
HEMATOCRIT: 34 — AB (ref 41–53)
Hemoglobin: 10.4 — AB (ref 13.5–17.5)
NEUTROS ABS: 12
WBC: 14

## 2017-09-26 ENCOUNTER — Telehealth: Payer: Self-pay

## 2017-09-26 ENCOUNTER — Encounter: Payer: Self-pay | Admitting: Adult Health

## 2017-09-26 ENCOUNTER — Non-Acute Institutional Stay (SKILLED_NURSING_FACILITY): Payer: Medicare Other | Admitting: Adult Health

## 2017-09-26 DIAGNOSIS — R180 Malignant ascites: Secondary | ICD-10-CM | POA: Diagnosis not present

## 2017-09-26 DIAGNOSIS — I2699 Other pulmonary embolism without acute cor pulmonale: Secondary | ICD-10-CM | POA: Diagnosis not present

## 2017-09-26 DIAGNOSIS — I82402 Acute embolism and thrombosis of unspecified deep veins of left lower extremity: Secondary | ICD-10-CM

## 2017-09-26 DIAGNOSIS — K8010 Calculus of gallbladder with chronic cholecystitis without obstruction: Secondary | ICD-10-CM | POA: Diagnosis not present

## 2017-09-26 NOTE — Telephone Encounter (Signed)
Possible re-admission to facility. This is a patient you were seeing at [facility] . [facility] F/U is needed if patient was re-admitted to facility upon discharge. Hospital discharge from [facility] on 09/24/2017
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
## 2017-09-28 ENCOUNTER — Other Ambulatory Visit: Payer: Self-pay

## 2017-09-28 ENCOUNTER — Encounter: Payer: Self-pay | Admitting: Adult Health

## 2017-09-28 ENCOUNTER — Non-Acute Institutional Stay (SKILLED_NURSING_FACILITY): Payer: Medicare Other | Admitting: Adult Health

## 2017-09-28 DIAGNOSIS — C799 Secondary malignant neoplasm of unspecified site: Secondary | ICD-10-CM | POA: Diagnosis not present

## 2017-09-28 MED ORDER — LORAZEPAM 2 MG/ML PO CONC: ORAL | 0 refills | Status: DC

## 2017-09-28 MED ORDER — MORPHINE SULFATE (CONCENTRATE) 20 MG/ML PO SOLN: ORAL | 0 refills | Status: DC
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
## 2017-09-28 NOTE — Telephone Encounter (Signed)
RX faxed to [facility] @ [phone], phone number [phone] 

## 2017-10-01 ENCOUNTER — Encounter: Payer: Self-pay | Admitting: Internal Medicine

## 2017-10-01 ENCOUNTER — Non-Acute Institutional Stay (SKILLED_NURSING_FACILITY): Payer: Medicare Other | Admitting: Internal Medicine

## 2017-10-01 DIAGNOSIS — C799 Secondary malignant neoplasm of unspecified site: Secondary | ICD-10-CM

## 2017-10-01 DIAGNOSIS — E43 Unspecified severe protein-calorie malnutrition: Secondary | ICD-10-CM | POA: Diagnosis not present

## 2017-10-01 DIAGNOSIS — J90 Pleural effusion, not elsewhere classified: Secondary | ICD-10-CM | POA: Diagnosis not present

## 2017-10-01 DIAGNOSIS — I2699 Other pulmonary embolism without acute cor pulmonale: Secondary | ICD-10-CM | POA: Diagnosis not present

## 2017-10-01 DIAGNOSIS — E1165 Type 2 diabetes mellitus with hyperglycemia: Secondary | ICD-10-CM

## 2017-10-01 DIAGNOSIS — R180 Malignant ascites: Secondary | ICD-10-CM | POA: Diagnosis not present

## 2017-10-01 DIAGNOSIS — R627 Adult failure to thrive: Secondary | ICD-10-CM

## 2017-10-01 DIAGNOSIS — D75839 Thrombocytosis, unspecified: Secondary | ICD-10-CM

## 2017-10-01 DIAGNOSIS — I82402 Acute embolism and thrombosis of unspecified deep veins of left lower extremity: Secondary | ICD-10-CM | POA: Diagnosis not present

## 2017-10-01 DIAGNOSIS — K8010 Calculus of gallbladder with chronic cholecystitis without obstruction: Secondary | ICD-10-CM | POA: Diagnosis not present

## 2017-10-01 DIAGNOSIS — E1149 Type 2 diabetes mellitus with other diabetic neurological complication: Secondary | ICD-10-CM

## 2017-10-01 DIAGNOSIS — D473 Essential (hemorrhagic) thrombocythemia: Secondary | ICD-10-CM

## 2017-10-01 DIAGNOSIS — IMO0002 Reserved for concepts with insufficient information to code with codable children: Secondary | ICD-10-CM

## 2017-10-02 ENCOUNTER — Encounter: Payer: Self-pay | Admitting: Internal Medicine

## 2017-10-02 DIAGNOSIS — K8010 Calculus of gallbladder with chronic cholecystitis without obstruction: Secondary | ICD-10-CM | POA: Insufficient documentation

## 2017-10-02 DIAGNOSIS — C799 Secondary malignant neoplasm of unspecified site: Secondary | ICD-10-CM | POA: Insufficient documentation

## 2017-10-02 DIAGNOSIS — I82402 Acute embolism and thrombosis of unspecified deep veins of left lower extremity: Secondary | ICD-10-CM | POA: Insufficient documentation

## 2017-10-02 DIAGNOSIS — R180 Malignant ascites: Secondary | ICD-10-CM | POA: Insufficient documentation

## 2017-10-02 DIAGNOSIS — I2699 Other pulmonary embolism without acute cor pulmonale: Secondary | ICD-10-CM | POA: Insufficient documentation
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
## 2017-10-02 DEATH — deceased

## 2019-05-31 IMAGING — MR MR HEAD W/O CM
9 of 11 series · 35 of 48 positions shown · non-contrast
Comparison: CT head 04/25/2017

CLINICAL DATA: Seizure, dementia

EXAM:
MRI HEAD WITHOUT CONTRAST
TECHNIQUE: Multiplanar, multiecho pulse sequences of the brain and surrounding
structures were obtained without intravenous contrast.

[Series 3: DWI · axial · 3.0mm · 1.09mm/px · z∈[-20,+111]mm · 8 of 94 slices shown (1 of 4)]
[im 1/94]
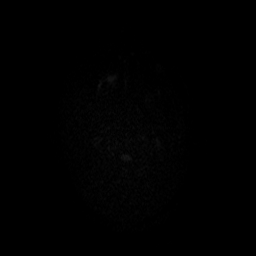
[im 11/94]
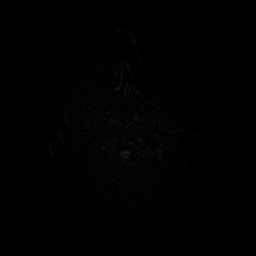
[im 32/94]
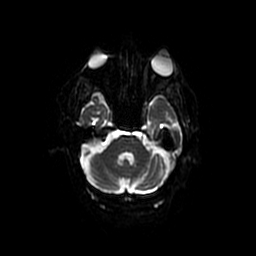
[im 42/94]
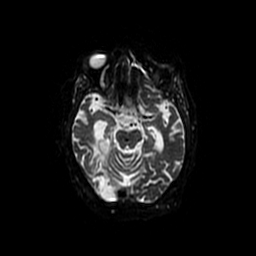
[im 52/94]
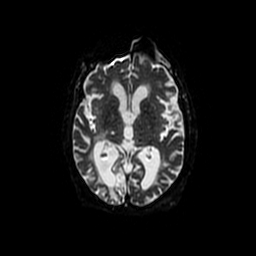
[im 63/94]
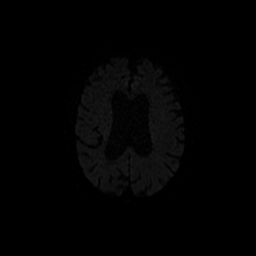
[im 83/94]
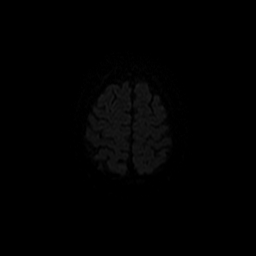
[im 94/94]
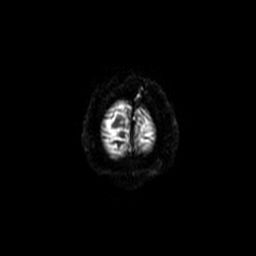

[Series 4: DWI · coronal · 5.0mm · 1.09mm/px · 7 of 66 slices shown (2 of 4)]
[im 1/66]
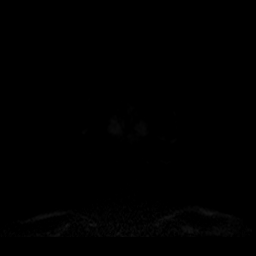
[im 11/66]
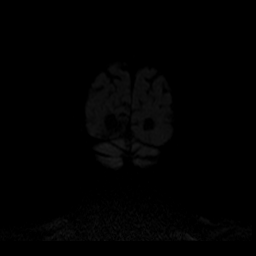
[im 22/66]
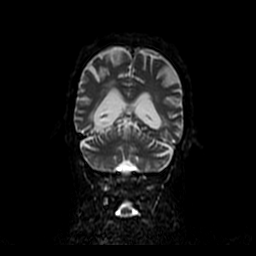
[im 33/66]
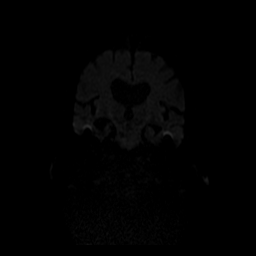
[im 44/66]
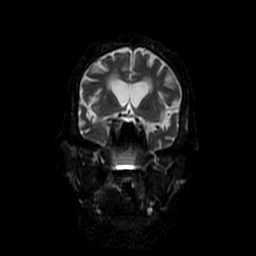
[im 55/66]
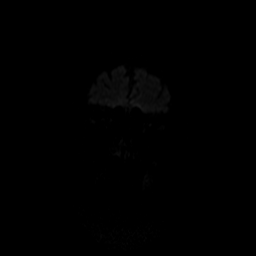
[im 66/66]
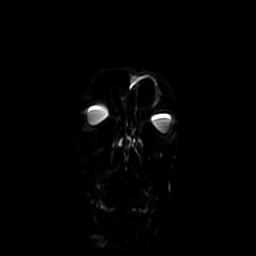

[Series 5: T1 · sagittal · 5.0mm · 0.47mm/px · 2 of 23 slices shown]
[im 1/23]
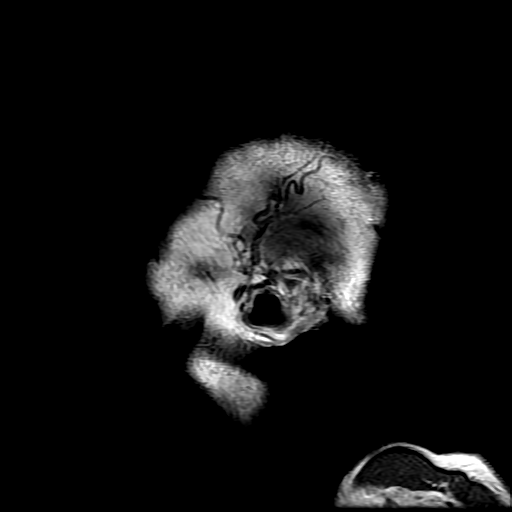
[im 23/23]
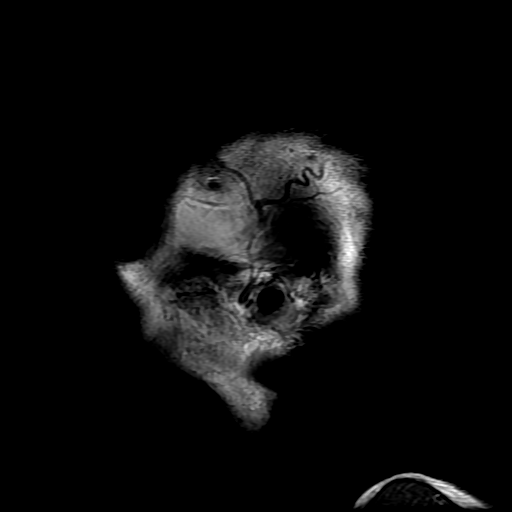

[Series 6: T2 · axial · 5.0mm · 0.43mm/px · z∈[-29,+102]mm · 2 of 24 slices shown (1 of 3)]
[im 1/24]
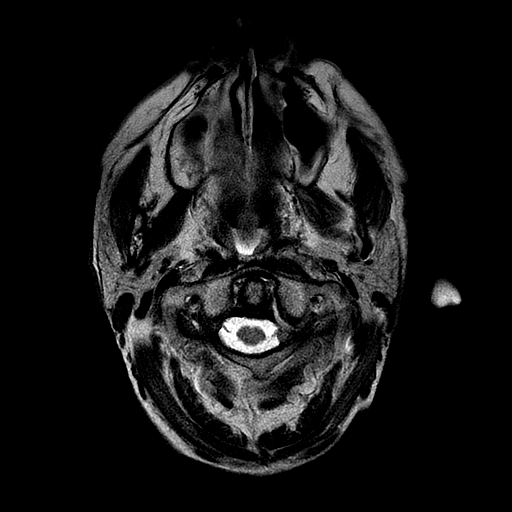
[im 24/24]
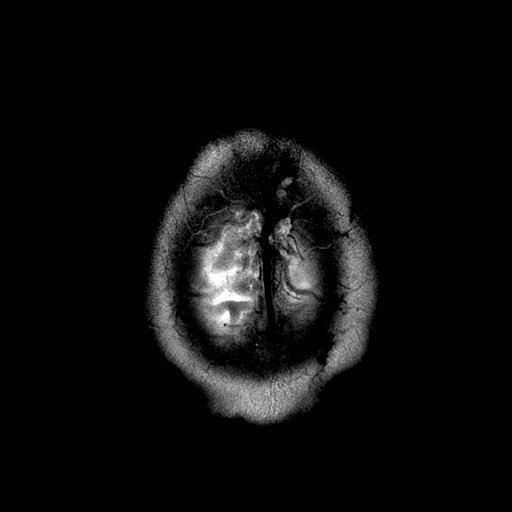

[Series 7: FLAIR · axial · 5.0mm · 0.43mm/px · z∈[-29,+102]mm · 2 of 24 slices shown]
[im 1/24]
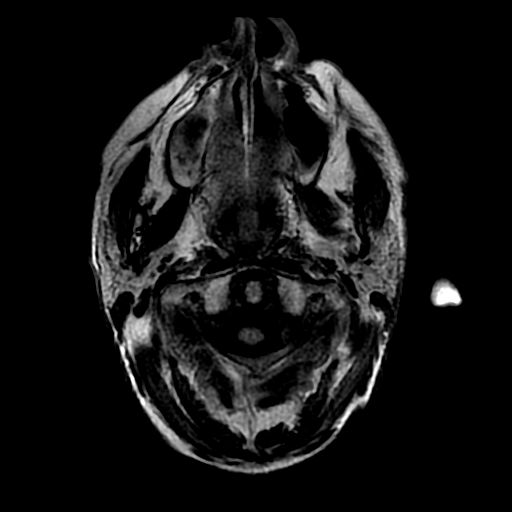
[im 24/24]
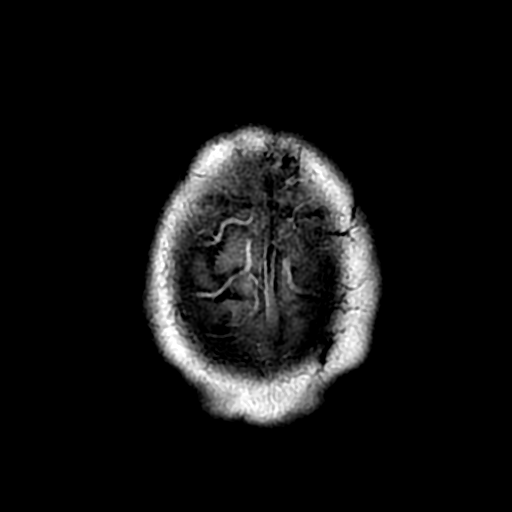

[Series 10: T2 · coronal · 5.0mm · 0.43mm/px · 3 of 28 slices shown (2 of 3)]
[im 1/28]
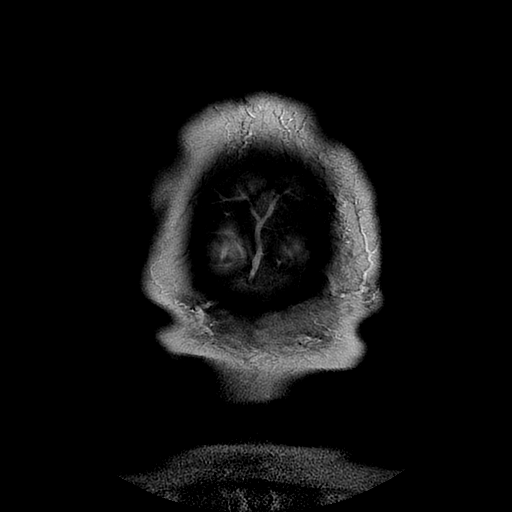
[im 14/28]
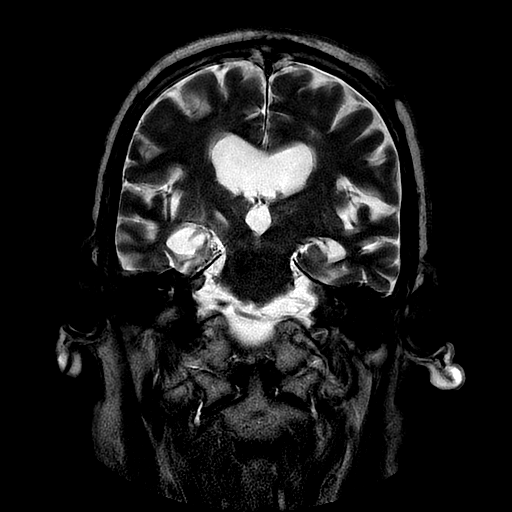
[im 28/28]
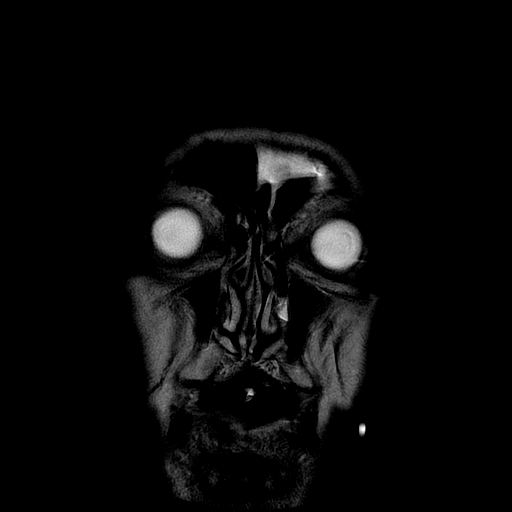

[Series 11: T2 · coronal · 3.0mm · 0.35mm/px · 3 of 28 slices shown (3 of 3)]
[im 1/28]
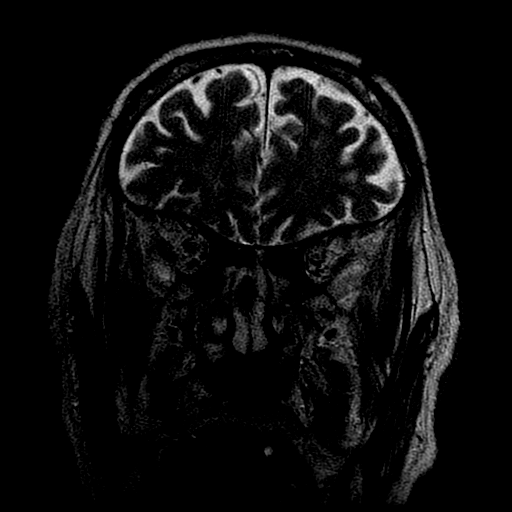
[im 14/28]
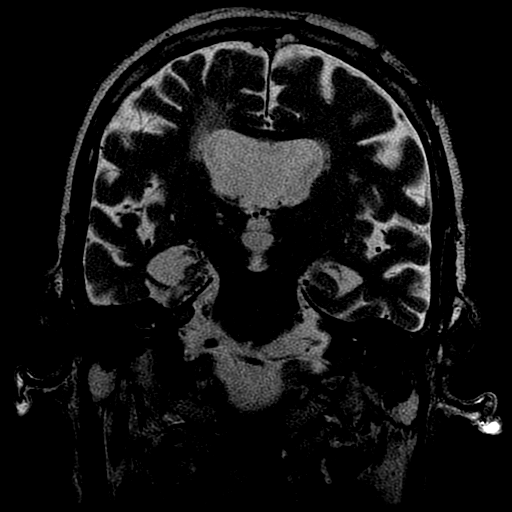
[im 28/28]
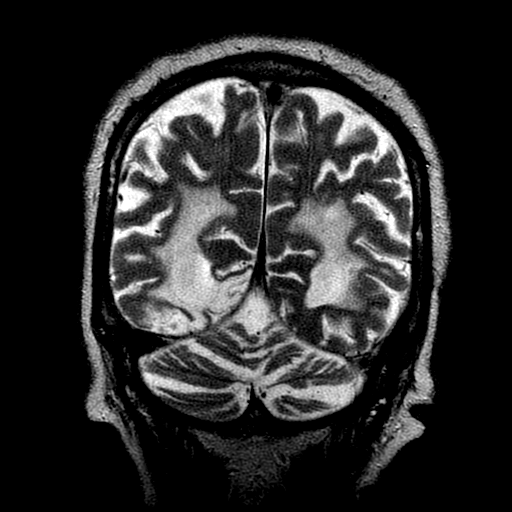

[Series 300: DWI · axial · 3.0mm · 1.09mm/px · z∈[-20,+111]mm · 5 of 47 slices shown (3 of 4)]
[im 1/47]
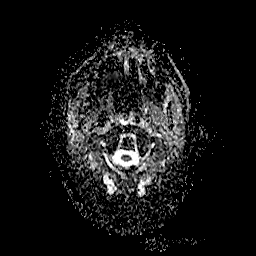
[im 12/47]
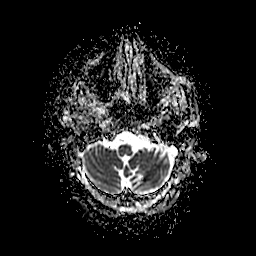
[im 24/47]
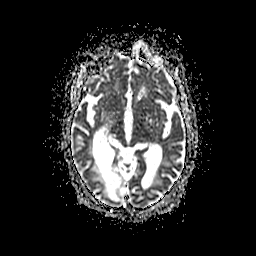
[im 35/47]
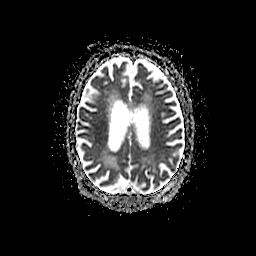
[im 47/47]
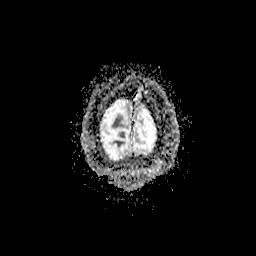

[Series 400: DWI · coronal · 5.0mm · 1.09mm/px · 3 of 33 slices shown (4 of 4)]
[im 1/33]
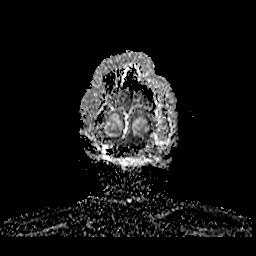
[im 17/33]
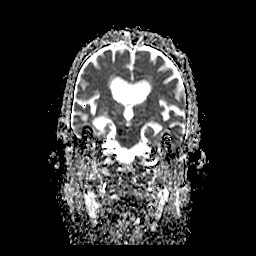
[im 33/33]
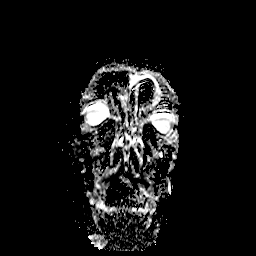

[35 of 48 positions shown; findings below may reference images not displayed]

FINDINGS: Brain: Moderate atrophy with ventricular enlargement. Prominent
atrophy of the hippocampus bilaterally. Chronic right occipital
infarct. Chronic microvascular ischemic change in the white matter
and basal ganglia bilaterally.

Negative for acute infarct

Multiple areas of chronic microhemorrhage including the left
cerebellum, brainstem, thalamus, and cerebral hemispheres. No fluid
collection or mass.

Vascular: Normal arterial flow voids

Skull and upper cervical spine: Negative new

Sinuses/Orbits: Paranasal sinuses demonstrate mild mucosal edema.
Bilateral cataract removal.

Other: None
IMPRESSION: Moderate atrophy. There is advanced atrophy of the hippocampus
bilaterally which may be due to Alzheimer's disease.

Extensive chronic ischemic change as above. No acute infarct.
Numerous areas of chronic microhemorrhage the brain likely related
to hypertension.

## 2019-05-31 IMAGING — DX DG CHEST 1V PORT
1 series · 1 of 1 positions shown · non-contrast
Comparison: Radiograph April 25, 2017.

CLINICAL DATA: Endotracheal tube placement.

EXAM:
PORTABLE CHEST 1 VIEW

[chest ap]
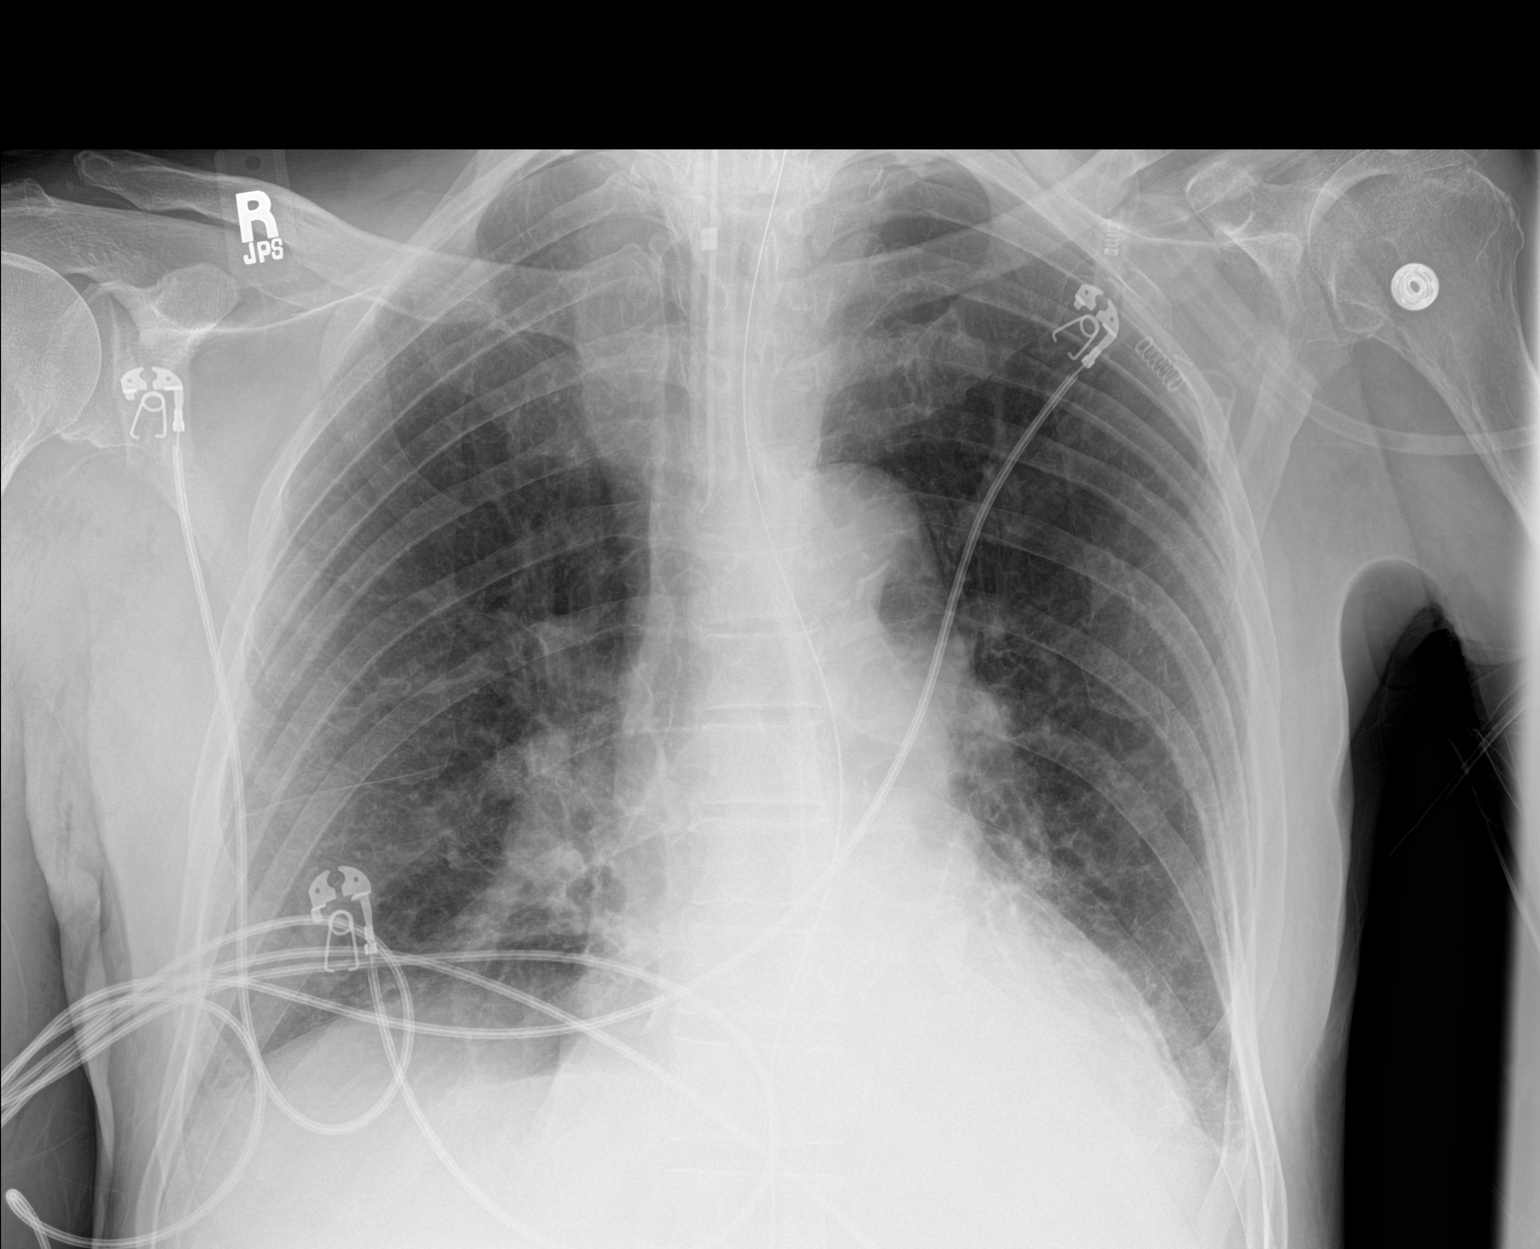

[1 of 1 positions shown; findings below may reference images not displayed]

FINDINGS: Stable cardiomediastinal silhouette. Atherosclerosis of thoracic
aorta is noted. Endotracheal tube is unchanged in position. There is
interval placement of nasogastric tube which is seen entering
stomach. No pneumothorax is noted. Right lung is clear. Stable left
basilar opacity is noted concerning for atelectasis or infiltrate
with associated pleural effusion. Bony thorax is unremarkable.
IMPRESSION: Aortic atherosclerosis. Endotracheal tube in grossly good position.
Interval placement of nasogastric tube. Stable left basilar opacity
concerning for atelectasis or infiltrate with associated pleural
effusion.

## 2019-06-02 IMAGING — DX DG CHEST 1V PORT
1 series · 1 of 1 positions shown · non-contrast
Comparison: 04/26/2017

CLINICAL DATA: ET tube, atelectasis

EXAM:
PORTABLE CHEST 1 VIEW

[chest ap]
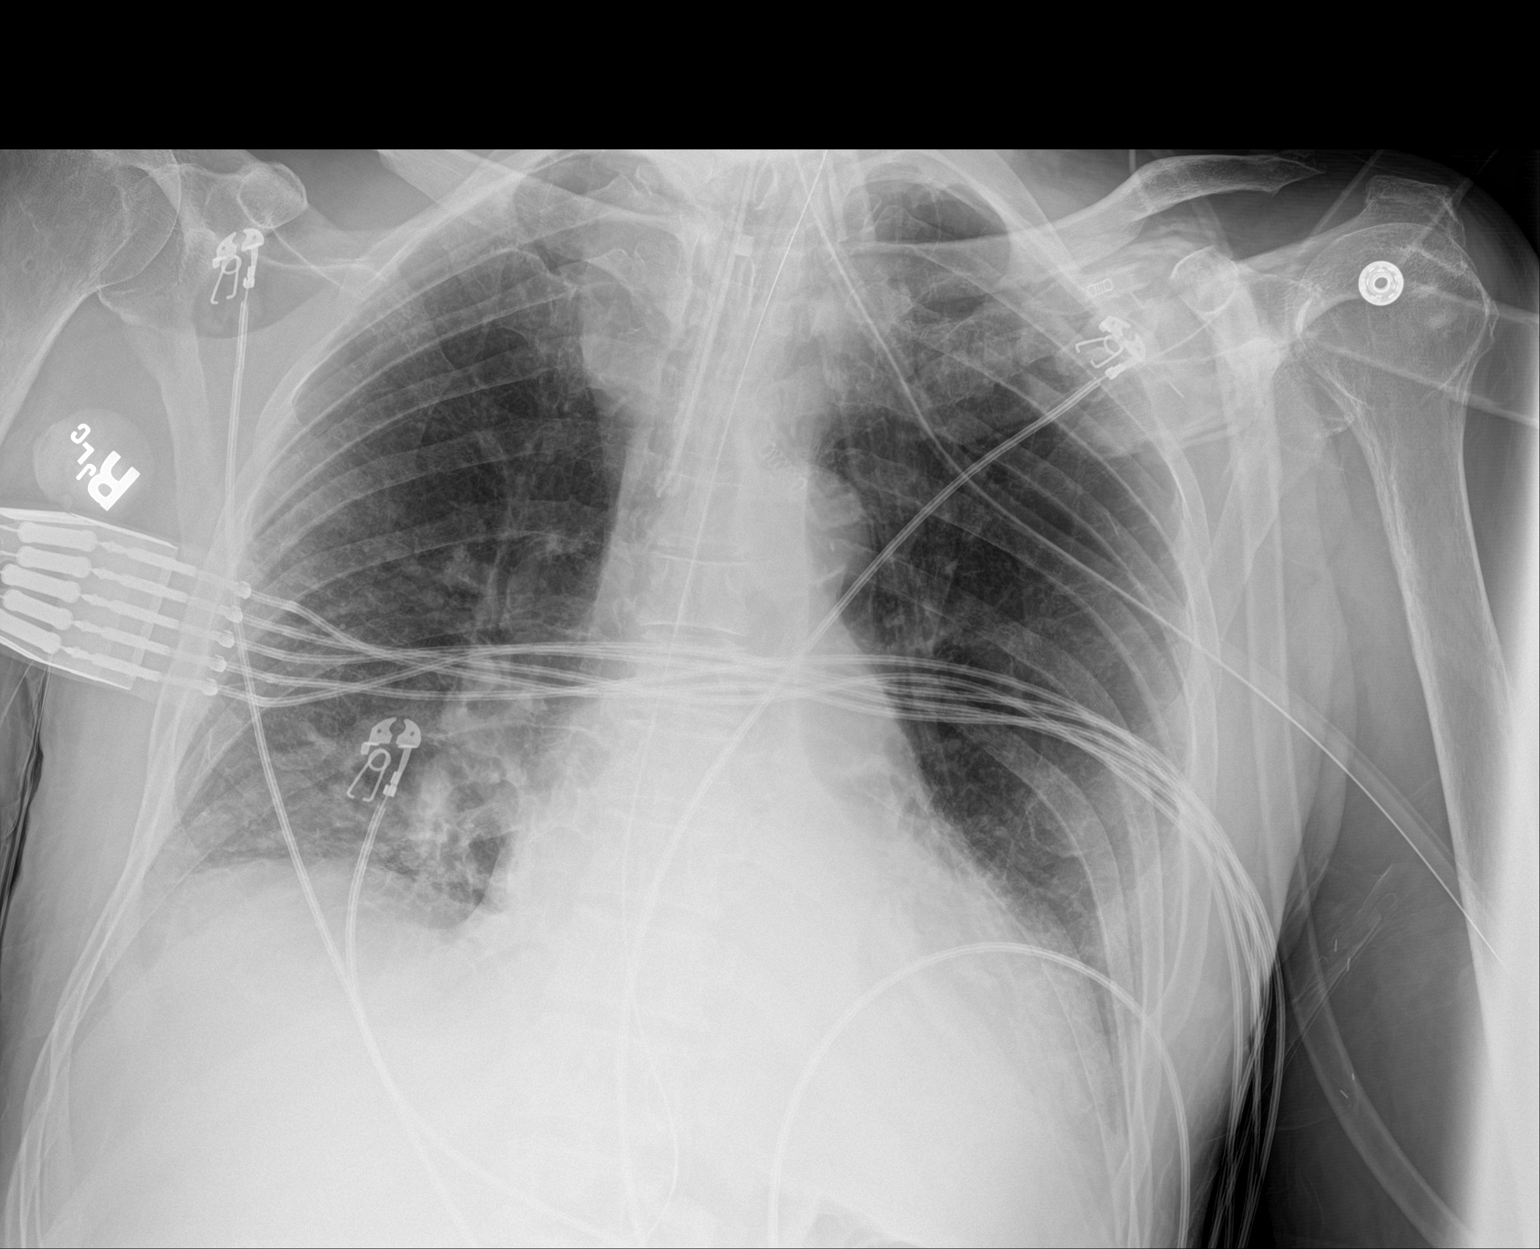

[1 of 1 positions shown; findings below may reference images not displayed]

FINDINGS: Support devices are unchanged. Bibasilar atelectasis or infiltrates,
left greater than right, similar to prior study. Mild cardiomegaly.
No visible significant effusions.
IMPRESSION: Bibasilar atelectasis or infiltrates, left greater than right.
